# Patient Record
Sex: Female | Born: 1945
Health system: Southern US, Community
[De-identification: ages and names within clinical notes are randomized; demographics above are authoritative.]

## PROBLEM LIST (undated history)

## (undated) DIAGNOSIS — E785 Hyperlipidemia, unspecified: Secondary | ICD-10-CM

## (undated) DIAGNOSIS — G479 Sleep disorder, unspecified: Secondary | ICD-10-CM

## (undated) DIAGNOSIS — F32A Depression, unspecified: Secondary | ICD-10-CM

## (undated) DIAGNOSIS — Z720 Tobacco use: Secondary | ICD-10-CM

## (undated) DIAGNOSIS — E039 Hypothyroidism, unspecified: Secondary | ICD-10-CM

## (undated) DIAGNOSIS — I700 Atherosclerosis of aorta: Secondary | ICD-10-CM

## (undated) DIAGNOSIS — M1990 Unspecified osteoarthritis, unspecified site: Secondary | ICD-10-CM

## (undated) DIAGNOSIS — B029 Zoster without complications: Secondary | ICD-10-CM

## (undated) DIAGNOSIS — J449 Chronic obstructive pulmonary disease, unspecified: Secondary | ICD-10-CM

## (undated) DIAGNOSIS — F329 Major depressive disorder, single episode, unspecified: Secondary | ICD-10-CM

## (undated) DIAGNOSIS — I10 Essential (primary) hypertension: Secondary | ICD-10-CM

## (undated) HISTORY — PX: BIOPSY THYROID: PRO38

## (undated) HISTORY — DX: Sleep disorder, unspecified: G47.9

## (undated) HISTORY — DX: Hypothyroidism, unspecified: E03.9

## (undated) HISTORY — DX: Tobacco use: Z72.0

## (undated) HISTORY — PX: OTHER SURGICAL HISTORY: SHX169

## (undated) HISTORY — DX: Major depressive disorder, single episode, unspecified: F32.9

## (undated) HISTORY — DX: Depression, unspecified: F32.A

## (undated) HISTORY — DX: Unspecified osteoarthritis, unspecified site: M19.90

## (undated) HISTORY — PX: CORONARY ANGIOPLASTY: SHX604

## (undated) HISTORY — PX: ABDOMINAL HYSTERECTOMY: SHX81

## (undated) HISTORY — DX: Zoster without complications: B02.9

---

## 1996-12-05 HISTORY — PX: OTHER SURGICAL HISTORY: SHX169

## 1997-02-04 HISTORY — PX: OTHER SURGICAL HISTORY: SHX169

## 1998-05-07 LAB — FECAL OCCULT BLOOD, GUAIAC

## 1998-06-06 HISTORY — PX: OTHER SURGICAL HISTORY: SHX169

## 1999-08-14 ENCOUNTER — Emergency Department (HOSPITAL_COMMUNITY): Admission: EM | Admit: 1999-08-14 | Discharge: 1999-08-14 | Payer: Self-pay | Admitting: Emergency Medicine

## 2000-07-23 ENCOUNTER — Encounter: Payer: Self-pay | Admitting: Emergency Medicine

## 2000-07-23 ENCOUNTER — Emergency Department (HOSPITAL_COMMUNITY): Admission: EM | Admit: 2000-07-23 | Discharge: 2000-07-23 | Payer: Self-pay | Admitting: Emergency Medicine

## 2000-09-06 ENCOUNTER — Encounter: Payer: Self-pay | Admitting: Family Medicine

## 2000-09-06 LAB — CONVERTED CEMR LAB

## 2004-07-21 ENCOUNTER — Ambulatory Visit: Payer: Self-pay | Admitting: Family Medicine

## 2004-08-20 ENCOUNTER — Ambulatory Visit: Payer: Self-pay | Admitting: Family Medicine

## 2004-10-08 ENCOUNTER — Ambulatory Visit: Payer: Self-pay | Admitting: Family Medicine

## 2004-11-20 ENCOUNTER — Ambulatory Visit: Payer: Self-pay | Admitting: Family Medicine

## 2005-01-06 ENCOUNTER — Ambulatory Visit: Payer: Self-pay | Admitting: Family Medicine

## 2005-02-09 ENCOUNTER — Ambulatory Visit: Payer: Self-pay | Admitting: Family Medicine

## 2005-03-30 ENCOUNTER — Ambulatory Visit: Payer: Self-pay | Admitting: Family Medicine

## 2005-03-30 LAB — CONVERTED CEMR LAB: TSH: 4.15 microintl units/mL

## 2005-04-15 ENCOUNTER — Ambulatory Visit: Payer: Self-pay | Admitting: Family Medicine

## 2005-12-07 ENCOUNTER — Emergency Department: Payer: Self-pay | Admitting: Emergency Medicine

## 2006-08-31 ENCOUNTER — Ambulatory Visit: Payer: Self-pay | Admitting: Family Medicine

## 2006-09-22 ENCOUNTER — Ambulatory Visit: Payer: Self-pay | Admitting: Endocrinology

## 2006-09-22 ENCOUNTER — Other Ambulatory Visit: Admission: RE | Admit: 2006-09-22 | Discharge: 2006-09-22 | Payer: Self-pay | Admitting: Endocrinology

## 2006-09-22 ENCOUNTER — Encounter (INDEPENDENT_AMBULATORY_CARE_PROVIDER_SITE_OTHER): Payer: Self-pay | Admitting: *Deleted

## 2006-10-18 ENCOUNTER — Encounter: Admission: RE | Admit: 2006-10-18 | Discharge: 2006-10-18 | Payer: Self-pay | Admitting: Surgery

## 2006-10-27 ENCOUNTER — Encounter (INDEPENDENT_AMBULATORY_CARE_PROVIDER_SITE_OTHER): Payer: Self-pay | Admitting: Specialist

## 2006-10-27 ENCOUNTER — Other Ambulatory Visit: Admission: RE | Admit: 2006-10-27 | Discharge: 2006-10-27 | Payer: Self-pay | Admitting: Interventional Radiology

## 2006-10-27 ENCOUNTER — Encounter: Admission: RE | Admit: 2006-10-27 | Discharge: 2006-10-27 | Payer: Self-pay | Admitting: Surgery

## 2006-11-22 ENCOUNTER — Ambulatory Visit: Payer: Self-pay | Admitting: Family Medicine

## 2006-11-22 LAB — CONVERTED CEMR LAB
ALT: 15 units/L (ref 0–40)
AST: 22 units/L (ref 0–37)
Cholesterol: 247 mg/dL (ref 0–200)
Direct LDL: 166.9 mg/dL
HDL: 57.3 mg/dL (ref >39.0)
Total CHOL/HDL Ratio: 4.3
Triglycerides: 101 mg/dL (ref 0–149)
VLDL: 20 mg/dL (ref 0–40)

## 2006-12-06 HISTORY — PX: OTHER SURGICAL HISTORY: SHX169

## 2006-12-14 ENCOUNTER — Encounter (INDEPENDENT_AMBULATORY_CARE_PROVIDER_SITE_OTHER): Payer: Self-pay | Admitting: Specialist

## 2006-12-14 ENCOUNTER — Ambulatory Visit (HOSPITAL_COMMUNITY): Admission: RE | Admit: 2006-12-14 | Discharge: 2006-12-15 | Payer: Self-pay | Admitting: Surgery

## 2006-12-16 ENCOUNTER — Ambulatory Visit: Payer: Self-pay | Admitting: Family Medicine

## 2007-01-05 ENCOUNTER — Telehealth (INDEPENDENT_AMBULATORY_CARE_PROVIDER_SITE_OTHER): Payer: Self-pay | Admitting: *Deleted

## 2007-02-09 ENCOUNTER — Encounter: Payer: Self-pay | Admitting: Family Medicine

## 2007-02-09 DIAGNOSIS — E039 Hypothyroidism, unspecified: Secondary | ICD-10-CM | POA: Insufficient documentation

## 2007-02-09 DIAGNOSIS — E069 Thyroiditis, unspecified: Secondary | ICD-10-CM | POA: Insufficient documentation

## 2007-02-09 DIAGNOSIS — G479 Sleep disorder, unspecified: Secondary | ICD-10-CM | POA: Insufficient documentation

## 2007-02-09 DIAGNOSIS — Z8639 Personal history of other endocrine, nutritional and metabolic disease: Secondary | ICD-10-CM | POA: Insufficient documentation

## 2007-02-09 DIAGNOSIS — M1990 Unspecified osteoarthritis, unspecified site: Secondary | ICD-10-CM | POA: Insufficient documentation

## 2007-02-09 DIAGNOSIS — Z8659 Personal history of other mental and behavioral disorders: Secondary | ICD-10-CM | POA: Insufficient documentation

## 2007-02-09 DIAGNOSIS — E7800 Pure hypercholesterolemia, unspecified: Secondary | ICD-10-CM | POA: Insufficient documentation

## 2007-02-09 DIAGNOSIS — M810 Age-related osteoporosis without current pathological fracture: Secondary | ICD-10-CM | POA: Insufficient documentation

## 2007-02-10 ENCOUNTER — Telehealth (INDEPENDENT_AMBULATORY_CARE_PROVIDER_SITE_OTHER): Payer: Self-pay | Admitting: *Deleted

## 2007-02-10 ENCOUNTER — Ambulatory Visit: Payer: Self-pay | Admitting: Family Medicine

## 2007-02-10 DIAGNOSIS — R609 Edema, unspecified: Secondary | ICD-10-CM | POA: Insufficient documentation

## 2007-02-10 DIAGNOSIS — Z87891 Personal history of nicotine dependence: Secondary | ICD-10-CM | POA: Insufficient documentation

## 2007-02-27 ENCOUNTER — Ambulatory Visit: Payer: Self-pay | Admitting: Family Medicine

## 2007-03-06 LAB — CONVERTED CEMR LAB
ALT: 13 U/L
AST: 24 U/L
Cholesterol: 237 mg/dL
Direct LDL: 150 mg/dL
HDL: 65.2 mg/dL
Total CHOL/HDL Ratio: 3.6
Triglycerides: 67 mg/dL
VLDL: 13 mg/dL

## 2007-03-15 ENCOUNTER — Ambulatory Visit: Payer: Self-pay | Admitting: Family Medicine

## 2007-03-15 DIAGNOSIS — I739 Peripheral vascular disease, unspecified: Secondary | ICD-10-CM | POA: Insufficient documentation

## 2007-03-22 ENCOUNTER — Ambulatory Visit: Payer: Self-pay

## 2007-04-14 ENCOUNTER — Ambulatory Visit: Payer: Self-pay | Admitting: Family Medicine

## 2007-04-17 LAB — CONVERTED CEMR LAB
ALT: 14 units/L (ref 0–35)
AST: 24 units/L (ref 0–37)
Albumin: 3.8 g/dL (ref 3.5–5.2)
BUN: 12 mg/dL (ref 6–23)
CO2: 28 meq/L (ref 19–32)
Calcium: 9.2 mg/dL (ref 8.4–10.5)
Chloride: 99 meq/L (ref 96–112)
Creatinine, Ser: 0.7 mg/dL (ref 0.4–1.2)
GFR calc Af Amer: 109 mL/min
GFR calc non Af Amer: 90 mL/min
Glucose, Bld: 98 mg/dL (ref 70–99)
Phosphorus: 4.2 mg/dL (ref 2.3–4.6)
Potassium: 4.8 meq/L (ref 3.5–5.1)
Pro B Natriuretic peptide (BNP): 33 pg/mL (ref 0.0–100.0)
Sodium: 135 meq/L (ref 135–145)
TSH: 25.98 microintl units/mL — ABNORMAL HIGH (ref 0.35–5.50)

## 2007-05-31 ENCOUNTER — Ambulatory Visit: Payer: Self-pay | Admitting: Family Medicine

## 2007-06-01 LAB — CONVERTED CEMR LAB: TSH: 1.15 microintl units/mL (ref 0.35–5.50)

## 2007-06-03 ENCOUNTER — Encounter: Payer: Self-pay | Admitting: *Deleted

## 2007-08-29 ENCOUNTER — Ambulatory Visit: Payer: Self-pay | Admitting: Family Medicine

## 2007-09-04 ENCOUNTER — Encounter (INDEPENDENT_AMBULATORY_CARE_PROVIDER_SITE_OTHER): Payer: Self-pay | Admitting: *Deleted

## 2007-09-04 LAB — CONVERTED CEMR LAB
ALT: 16 units/L (ref 0–35)
AST: 22 units/L (ref 0–37)
Albumin: 3.6 g/dL (ref 3.5–5.2)
Alkaline Phosphatase: 63 units/L (ref 39–117)
BUN: 10 mg/dL (ref 6–23)
Basophils Absolute: 0.1 10*3/uL (ref 0.0–0.1)
Basophils Relative: 1 % (ref 0.0–1.0)
Bilirubin, Direct: 0.1 mg/dL (ref 0.0–0.3)
CO2: 30 meq/L (ref 19–32)
Calcium: 8.8 mg/dL (ref 8.4–10.5)
Chloride: 103 meq/L (ref 96–112)
Cholesterol: 168 mg/dL (ref 0–200)
Creatinine, Ser: 0.7 mg/dL (ref 0.4–1.2)
Eosinophils Absolute: 0.1 10*3/uL (ref 0.0–0.6)
Eosinophils Relative: 1.2 % (ref 0.0–5.0)
GFR calc Af Amer: 109 mL/min
GFR calc non Af Amer: 90 mL/min
Glucose, Bld: 86 mg/dL (ref 70–99)
HCT: 37.1 % (ref 36.0–46.0)
HDL: 49.3 mg/dL (ref >39.0)
Hemoglobin: 13 g/dL (ref 12.0–15.0)
LDL Cholesterol: 111 mg/dL — ABNORMAL HIGH (ref 0–99)
Lymphocytes Relative: 52.9 % — ABNORMAL HIGH (ref 12.0–46.0)
MCHC: 35 g/dL (ref 30.0–36.0)
MCV: 92.1 fL (ref 78.0–100.0)
Monocytes Absolute: 0.6 10*3/uL (ref 0.2–0.7)
Monocytes Relative: 9.7 % (ref 3.0–11.0)
Neutro Abs: 2.2 10*3/uL (ref 1.4–7.7)
Neutrophils Relative %: 35.2 % — ABNORMAL LOW (ref 43.0–77.0)
Platelets: 273 10*3/uL (ref 150–400)
Potassium: 3.8 meq/L (ref 3.5–5.1)
RBC: 4.03 M/uL (ref 3.87–5.11)
RDW: 12.7 % (ref 11.5–14.6)
Sodium: 140 meq/L (ref 135–145)
TSH: 0.18 microintl units/mL — ABNORMAL LOW (ref 0.35–5.50)
Total Bilirubin: 0.7 mg/dL (ref 0.3–1.2)
Total CHOL/HDL Ratio: 3.4
Total Protein: 6.5 g/dL (ref 6.0–8.3)
Triglycerides: 41 mg/dL (ref 0–149)
VLDL: 8 mg/dL (ref 0–40)
WBC: 6.3 10*3/uL (ref 4.5–10.5)

## 2007-10-02 ENCOUNTER — Telehealth (INDEPENDENT_AMBULATORY_CARE_PROVIDER_SITE_OTHER): Payer: Self-pay | Admitting: Internal Medicine

## 2007-11-10 ENCOUNTER — Ambulatory Visit: Payer: Self-pay | Admitting: Family Medicine

## 2007-11-15 LAB — CONVERTED CEMR LAB
Free T4: 1.02 ng/dL (ref 0.89–1.80)
TSH: 13.679 microintl units/mL — ABNORMAL HIGH (ref 0.350–5.50)

## 2007-11-22 ENCOUNTER — Encounter: Payer: Self-pay | Admitting: Family Medicine

## 2007-11-30 ENCOUNTER — Encounter (INDEPENDENT_AMBULATORY_CARE_PROVIDER_SITE_OTHER): Payer: Self-pay | Admitting: *Deleted

## 2007-12-28 ENCOUNTER — Ambulatory Visit: Payer: Self-pay | Admitting: Family Medicine

## 2007-12-29 ENCOUNTER — Telehealth: Payer: Self-pay | Admitting: Family Medicine

## 2008-01-01 LAB — CONVERTED CEMR LAB
Free T4: 1.1 ng/dL (ref 0.6–1.6)
TSH: 0.83 microintl units/mL (ref 0.35–5.50)

## 2008-01-10 ENCOUNTER — Ambulatory Visit: Payer: Self-pay | Admitting: Family Medicine

## 2008-01-10 DIAGNOSIS — J309 Allergic rhinitis, unspecified: Secondary | ICD-10-CM | POA: Insufficient documentation

## 2008-04-12 ENCOUNTER — Ambulatory Visit: Payer: Self-pay | Admitting: Family Medicine

## 2008-04-15 ENCOUNTER — Encounter (INDEPENDENT_AMBULATORY_CARE_PROVIDER_SITE_OTHER): Payer: Self-pay | Admitting: *Deleted

## 2008-04-15 LAB — CONVERTED CEMR LAB: TSH: 0.06 microintl units/mL — ABNORMAL LOW (ref 0.350–4.50)

## 2008-05-29 ENCOUNTER — Ambulatory Visit: Payer: Self-pay | Admitting: Family Medicine

## 2008-05-31 LAB — CONVERTED CEMR LAB
Free T4: 0.5 ng/dL — ABNORMAL LOW (ref 0.6–1.6)
TSH: 49.73 microintl units/mL — ABNORMAL HIGH (ref 0.35–5.50)

## 2008-06-21 ENCOUNTER — Ambulatory Visit: Payer: Self-pay | Admitting: Family Medicine

## 2008-06-24 LAB — CONVERTED CEMR LAB
Basophils Absolute: 0 10*3/uL (ref 0.0–0.1)
Basophils Relative: 1 % (ref 0–1)
Eosinophils Absolute: 0.1 10*3/uL (ref 0.0–0.7)
Eosinophils Relative: 2 % (ref 0–5)
HCT: 37.9 % (ref 36.0–46.0)
Hemoglobin: 12.7 g/dL (ref 12.0–15.0)
Lymphocytes Relative: 41 % (ref 12–46)
Lymphs Abs: 2.9 10*3/uL (ref 0.7–4.0)
MCHC: 33.5 g/dL (ref 30.0–36.0)
MCV: 91.5 fL (ref 78.0–100.0)
Monocytes Absolute: 0.6 10*3/uL (ref 0.1–1.0)
Monocytes Relative: 9 % (ref 3–12)
Neutro Abs: 3.4 10*3/uL (ref 1.7–7.7)
Neutrophils Relative %: 48 % (ref 43–77)
Platelets: 272 10*3/uL (ref 150–400)
RBC: 4.14 M/uL (ref 3.87–5.11)
RDW: 13.6 % (ref 11.5–15.5)
TSH: 27.96 microintl units/mL — ABNORMAL HIGH (ref 0.350–4.50)
WBC: 7.1 10*3/uL (ref 4.0–10.5)

## 2008-06-26 ENCOUNTER — Telehealth: Payer: Self-pay | Admitting: Family Medicine

## 2008-07-08 ENCOUNTER — Ambulatory Visit: Payer: Self-pay | Admitting: Family Medicine

## 2008-07-09 ENCOUNTER — Encounter (INDEPENDENT_AMBULATORY_CARE_PROVIDER_SITE_OTHER): Payer: Self-pay | Admitting: *Deleted

## 2008-07-18 ENCOUNTER — Ambulatory Visit: Payer: Self-pay | Admitting: Family Medicine

## 2008-07-25 LAB — CONVERTED CEMR LAB
BUN: 11 mg/dL (ref 6–23)
CO2: 31 meq/L (ref 19–32)
Calcium: 8.8 mg/dL (ref 8.4–10.5)
Chloride: 107 meq/L (ref 96–112)
Creatinine, Ser: 0.6 mg/dL (ref 0.4–1.2)
GFR calc Af Amer: 130 mL/min
GFR calc non Af Amer: 108 mL/min
Glucose, Bld: 103 mg/dL — ABNORMAL HIGH (ref 70–99)
Potassium: 4.1 meq/L (ref 3.5–5.1)
Sodium: 141 meq/L (ref 135–145)
TSH: 6.35 microintl units/mL — ABNORMAL HIGH (ref 0.35–5.50)

## 2009-01-10 ENCOUNTER — Ambulatory Visit: Payer: Self-pay | Admitting: Family Medicine

## 2009-01-13 LAB — CONVERTED CEMR LAB
ALT: 11 units/L (ref 0–35)
AST: 17 units/L (ref 0–37)
Albumin: 4 g/dL (ref 3.5–5.2)
BUN: 16 mg/dL (ref 6–23)
CO2: 25 meq/L (ref 19–32)
Calcium: 9.1 mg/dL (ref 8.4–10.5)
Chloride: 106 meq/L (ref 96–112)
Cholesterol: 211 mg/dL — ABNORMAL HIGH (ref 0–200)
Creatinine, Ser: 0.69 mg/dL (ref 0.40–1.20)
Glucose, Bld: 105 mg/dL — ABNORMAL HIGH (ref 70–99)
HDL: 68 mg/dL (ref >39)
LDL Cholesterol: 127 mg/dL — ABNORMAL HIGH (ref 0–99)
Phosphorus: 3.6 mg/dL (ref 2.3–4.6)
Potassium: 4.1 meq/L (ref 3.5–5.3)
Sodium: 143 meq/L (ref 135–145)
TSH: 0.499 microintl units/mL (ref 0.350–4.500)
Total CHOL/HDL Ratio: 3.1
Triglycerides: 81 mg/dL (ref <150)
VLDL: 16 mg/dL (ref 0–40)

## 2009-02-19 ENCOUNTER — Ambulatory Visit: Payer: Self-pay | Admitting: Family Medicine

## 2009-03-04 ENCOUNTER — Ambulatory Visit: Payer: Self-pay | Admitting: Family Medicine

## 2009-03-04 DIAGNOSIS — M21619 Bunion of unspecified foot: Secondary | ICD-10-CM | POA: Insufficient documentation

## 2009-03-25 ENCOUNTER — Ambulatory Visit: Payer: Self-pay | Admitting: Family Medicine

## 2009-03-25 ENCOUNTER — Telehealth: Payer: Self-pay | Admitting: Family Medicine

## 2009-04-13 ENCOUNTER — Emergency Department: Payer: Self-pay | Admitting: Emergency Medicine

## 2009-04-18 ENCOUNTER — Ambulatory Visit: Payer: Self-pay | Admitting: Family Medicine

## 2009-04-21 LAB — CONVERTED CEMR LAB
ALT: 8 units/L (ref 0–35)
AST: 16 units/L (ref 0–37)
Cholesterol: 207 mg/dL — ABNORMAL HIGH (ref 0–200)
HDL: 59 mg/dL (ref >39)
LDL Cholesterol: 129 mg/dL — ABNORMAL HIGH (ref 0–99)
TSH: 1.973 microintl units/mL (ref 0.350–4.500)
Total CHOL/HDL Ratio: 3.5
Triglycerides: 95 mg/dL (ref <150)
VLDL: 19 mg/dL (ref 0–40)

## 2009-05-14 ENCOUNTER — Ambulatory Visit: Payer: Self-pay | Admitting: Family Medicine

## 2009-05-21 ENCOUNTER — Encounter: Admission: RE | Admit: 2009-05-21 | Discharge: 2009-05-21 | Payer: Self-pay | Admitting: Family Medicine

## 2009-05-22 ENCOUNTER — Ambulatory Visit: Payer: Self-pay | Admitting: Family Medicine

## 2009-05-26 ENCOUNTER — Encounter: Payer: Self-pay | Admitting: Family Medicine

## 2009-06-23 ENCOUNTER — Encounter: Payer: Self-pay | Admitting: Family Medicine

## 2009-06-24 ENCOUNTER — Telehealth: Payer: Self-pay | Admitting: Family Medicine

## 2009-06-27 ENCOUNTER — Telehealth: Payer: Self-pay | Admitting: Family Medicine

## 2009-07-17 ENCOUNTER — Encounter: Payer: Self-pay | Admitting: Family Medicine

## 2009-07-24 ENCOUNTER — Telehealth (INDEPENDENT_AMBULATORY_CARE_PROVIDER_SITE_OTHER): Payer: Self-pay | Admitting: *Deleted

## 2009-08-18 ENCOUNTER — Ambulatory Visit: Payer: Self-pay | Admitting: Family Medicine

## 2009-08-18 DIAGNOSIS — I10 Essential (primary) hypertension: Secondary | ICD-10-CM | POA: Insufficient documentation

## 2009-08-20 ENCOUNTER — Telehealth: Payer: Self-pay | Admitting: Family Medicine

## 2009-08-22 LAB — CONVERTED CEMR LAB
Albumin: 3.6 g/dL (ref 3.5–5.2)
BUN: 11 mg/dL (ref 6–23)
Basophils Absolute: 0.1 10*3/uL (ref 0.0–0.1)
Basophils Relative: 1.9 % (ref 0.0–3.0)
CO2: 31 meq/L (ref 19–32)
Calcium: 8.8 mg/dL (ref 8.4–10.5)
Chloride: 106 meq/L (ref 96–112)
Creatinine, Ser: 0.7 mg/dL (ref 0.4–1.2)
Eosinophils Absolute: 0.2 10*3/uL (ref 0.0–0.7)
Eosinophils Relative: 3.9 % (ref 0.0–5.0)
GFR calc non Af Amer: 89.56 mL/min (ref >60)
Glucose, Bld: 88 mg/dL (ref 70–99)
HCT: 39.2 % (ref 36.0–46.0)
Hemoglobin: 13.2 g/dL (ref 12.0–15.0)
Lymphocytes Relative: 34.7 % (ref 12.0–46.0)
Lymphs Abs: 2.2 10*3/uL (ref 0.7–4.0)
MCHC: 33.7 g/dL (ref 30.0–36.0)
MCV: 94.9 fL (ref 78.0–100.0)
Monocytes Absolute: 0.4 10*3/uL (ref 0.1–1.0)
Monocytes Relative: 6 % (ref 3.0–12.0)
Neutro Abs: 3.4 10*3/uL (ref 1.4–7.7)
Neutrophils Relative %: 53.5 % (ref 43.0–77.0)
Phosphorus: 3.6 mg/dL (ref 2.3–4.6)
Platelets: 257 10*3/uL (ref 150.0–400.0)
Potassium: 4.1 meq/L (ref 3.5–5.1)
RBC: 4.13 M/uL (ref 3.87–5.11)
RDW: 13.7 % (ref 11.5–14.6)
Sodium: 142 meq/L (ref 135–145)
TSH: 1.29 microintl units/mL (ref 0.35–5.50)
WBC: 6.3 10*3/uL (ref 4.5–10.5)

## 2009-08-27 ENCOUNTER — Telehealth: Payer: Self-pay | Admitting: Family Medicine

## 2009-09-04 ENCOUNTER — Telehealth: Payer: Self-pay | Admitting: Family Medicine

## 2009-09-09 ENCOUNTER — Ambulatory Visit: Payer: Self-pay | Admitting: Family Medicine

## 2009-11-04 HISTORY — PX: THYROIDECTOMY: SHX17

## 2009-11-18 ENCOUNTER — Telehealth: Payer: Self-pay | Admitting: Family Medicine

## 2009-12-18 ENCOUNTER — Telehealth: Payer: Self-pay | Admitting: Family Medicine

## 2010-01-01 ENCOUNTER — Telehealth: Payer: Self-pay | Admitting: Family Medicine

## 2010-03-17 ENCOUNTER — Ambulatory Visit: Payer: Self-pay | Admitting: Family Medicine

## 2010-03-18 LAB — CONVERTED CEMR LAB
ALT: 10 units/L (ref 0–35)
AST: 21 units/L (ref 0–37)
Cholesterol: 209 mg/dL — ABNORMAL HIGH (ref 0–200)
Direct LDL: 126.8 mg/dL
HDL: 75.4 mg/dL (ref >39.00)
TSH: 0.16 microintl units/mL — ABNORMAL LOW (ref 0.35–5.50)
Total CHOL/HDL Ratio: 3
Triglycerides: 62 mg/dL (ref 0.0–149.0)
VLDL: 12.4 mg/dL (ref 0.0–40.0)

## 2010-05-07 ENCOUNTER — Telehealth: Payer: Self-pay | Admitting: Family Medicine

## 2010-05-12 ENCOUNTER — Encounter (INDEPENDENT_AMBULATORY_CARE_PROVIDER_SITE_OTHER): Payer: Self-pay | Admitting: *Deleted

## 2010-05-19 ENCOUNTER — Ambulatory Visit: Payer: Self-pay | Admitting: Family Medicine

## 2010-05-21 LAB — CONVERTED CEMR LAB
Free T4: 0.87 ng/dL (ref 0.60–1.60)
TSH: 0.72 microintl units/mL (ref 0.35–5.50)

## 2010-05-25 ENCOUNTER — Ambulatory Visit: Payer: Self-pay | Admitting: Family Medicine

## 2010-07-10 ENCOUNTER — Telehealth: Payer: Self-pay | Admitting: Family Medicine

## 2010-09-28 ENCOUNTER — Encounter: Payer: Self-pay | Admitting: Family Medicine

## 2010-10-08 NOTE — Letter (Signed)
Summary: Out of Work  [employer] at [facility]  [address] [place], [place] [address]   Phone: [phone]  Fax: [phone]    May 22, 2009   Employee:  Aizlyn L Skarda    To Whom It May Concern:   For Medical reasons, please excuse the above named employee from work for the following dates:  Start:   05/22/2009  End:   patient has upcoming orthopedics eval within the next few days -- will defer return to work to them.  If you need additional information, please feel free to contact our office.         Sincerely,    Hannah Beat MD

## 2010-10-08 NOTE — Progress Notes (Signed)
Summary: pt needs another referral  Phone Note Call from Patient Call back at [phone]   Caller: Patient Call For: Judith Part MD Summary of Call: Pt went to see Dr. Willeen Cass today but she didnt see him because they wanted too much money up front and she didnt have it.  She is asking if she can be referred somewhere else, where she wont have to have as much up front.  They said she had to have $160.00 before she could be seen.  Please advise. Initial call taken by: Lowella Petties CMA,  August 20, 2009 2:50 PM  Follow-up for Phone Call        Shirlee Limerick- ? any ideas  Follow-up by: Judith Part MD,  August 20, 2009 3:35 PM  Additional Follow-up for Phone Call Additional follow up Details #1::        [facility] they would require $100 at 1st appt then payment plans. Called the patient and she cant afford to pay anything right now. She has an appt December 22nd at the [facility] in [place], I asked her to plese call me tommorrow to update you with what treatment they would do for her or if they would be referring her to a specialist. She said she would call me after the appt.  Additional Follow-up by: Carlton Adam,  August 26, 2009 2:47 PM    Additional Follow-up for Phone Call Additional follow up Details #2::    thanks for the update - I will wait for more info Follow-up by: Judith Part MD,  August 26, 2009 3:37 PM

## 2010-10-08 NOTE — Progress Notes (Signed)
Summary: Bone density  Phone Note Call from Patient Call back at Home Phone [phone]   Caller: Patient Call For: Dr. Patsy Lager Summary of Call: patient calling needing a bone density test. Please advise Initial call taken by: Mervin Hack CMA Duncan Dull),  July 24, 2009 1:46 PM  Follow-up for Phone Call        Ms. Leeder had a bone density test 11/2007.  She definitely has significant osteoporosis. She should still be taking her Fosamax and Vitamin D and Calcium.   I saw that Dr. Lestine Box asked her to check about this. It should be OK to wait until spring to repeat, but should definitely be taking the medicines above.  cc: Dr. Milinda Antis, fyi, complex foot diffuse stress reaction / stress fractures involving most of forefoot. Follow-up by: Hannah Beat MD,  July 24, 2009 1:51 PM  Additional Follow-up for Phone Call Additional follow up Details #1::        Patient advised.Consuello Masse CMA  Additional Follow-up by: Benny Lennert CMA (AAMA),  July 24, 2009 3:15 PM

## 2010-10-08 NOTE — Progress Notes (Signed)
Summary: refill request for temazepam  Phone Note Refill Request Message from:  Fax from Pharmacy  Refills Requested: Medication #1:  RESTORIL 30 MG CAPS 1 by mouth at bedtime as needed insomnia   Last Refilled: 04/03/2010 Faxed request from asher mcadams, [phone].  Initial call taken by: Lowella Petties CMA,  May 07, 2010 9:59 AM  Follow-up for Phone Call        px written on EMR for call in  Follow-up by: Judith Part MD,  May 07, 2010 11:09 AM  Additional Follow-up for Phone Call Additional follow up Details #1::        Medication phoned to [facility] pharmacy as instructed. Lewanda Rife LPN  May 07, 2010 2:31 PM     Prescriptions: RESTORIL 30 MG CAPS (TEMAZEPAM) 1 by mouth at bedtime as needed insomnia  #30 x 5   Entered and Authorized by:   Judith Part MD   Signed by:   Lewanda Rife LPN on 91/47/8295   Method used:   Telephoned to ...       [facility] (retail)       [address]       [place], [place]  [id number]       Ph: [phone]       Fax: [phone]   RxID:   [id number]

## 2010-10-08 NOTE — Progress Notes (Signed)
Summary: ortho  Phone Note Call from Patient   Caller: Spouse Call For: [name] [name] Summary of Call: Patient want a orthopedics dr in [place] becuase they can not afford to keep going to [place] and paying a 60 dollar copay also Initial call taken by: Benny Lennert CMA Duncan Dull),  June 27, 2009 9:46 AM  Follow-up for Phone Call        Complex foot injury - I would recommend follow-up with Dr Lestine Box, the only foot specialist in this area Follow-up by: Hannah Beat MD,  June 27, 2009 9:49 AM  Additional Follow-up for Phone Call Additional follow up Details #1::        Patient advised.Consuello Masse CMA  Additional Follow-up by: Benny Lennert CMA (AAMA),  June 27, 2009 11:05 AM

## 2010-10-08 NOTE — Progress Notes (Signed)
Summary: refill request for temazepam  Phone Note Refill Request Message from:  Fax from Pharmacy  Refills Requested: Medication #1:  temazepam 30 mg   Last Refilled: 11/18/2009 Faxed request from [facility], this is not on med list.   [phone]  Initial call taken by: Lowella Petties CMA,  January 01, 2010 11:02 AM  Follow-up for Phone Call        px written on EMR for call in  Follow-up by: Judith Part MD,  January 01, 2010 12:59 PM  Additional Follow-up for Phone Call Additional follow up Details #1::        Medication phoned to [facility]  pharmacy as instructed. Lewanda Rife LPN  January 01, 2010 1:02 PM     Prescriptions: RESTORIL 30 MG CAPS (TEMAZEPAM) 1 by mouth at bedtime as needed insomnia  #30 x 3   Entered and Authorized by:   Judith Part MD   Signed by:   Lewanda Rife LPN on 45/40/9811   Method used:   Telephoned to ...       [facility] (retail)       [address]       [place], [place]  [id number]       Ph: [phone]       Fax: [phone]   RxID:   [id number]

## 2010-10-08 NOTE — Assessment & Plan Note (Signed)
Summary: has lump in neck   Vital Signs:  Patient profile:   65 year old female Weight:      125 pounds Temp:     97.7 degrees F oral Pulse rate:   76 / minute Pulse rhythm:   regular BP sitting:   158 / 84  (left arm) Cuff size:   regular  Vitals Entered By: Lowella Petties CMA (08/21/09 3:12 PM) CC: Lump right side of neck x 3 days, doesn't hurt., Back Pain   History of Present Illness: still seeing Dr Lestine Box for foot - pain is better / not gone and out of the boots   had to quit work for this  cannot walk on feet for long periods of time   has  lump on R front of her neck since saturday does not hurt  does not feel like thyroid is bigger - but feels like her throat is more closed up than it used to be  (partial thyroidectomy with Dr Jamey Ripa in the past )  more trouble with sleep lately  needs refil on her restoril -- with less activity- is sleeping less well   no fever/ sore throat  occ little dry cough - not productive  occ wheezing - not often is is still smoke free  occ some chest pain -- not when she exerts herself -- and no sob or nausea or sweating  not every day it just goes away within 30 - 60 minutes  no hx of heart problems or testing   mother had heart problems    Allergies: No Known Drug Allergies  Past History:  Past Medical History: Last updated: 07/08/2008 Depression Hypothyroidism Osteoarthritis Osteoporosis tab abuse- past (quit 09) sleep disorder  Past Surgical History: Last updated: 02/09/2007 Hysterectomy- cervical ca cells Left wrist fracture- surgery Bunions (06/1998) Hashimotos (02/1997) Dexa- OP (12/1996) Thyroid biopsy- pos neoplasm, surgery (09/2006) CXR- COPD, TS partial compression fracture (12/2006) Right thyroid lobectomy, goiter, thyroiditis (12/2006)  Family History: Last updated: August 21, 2009 Father: died of stroke at 47, HTN Mother: DM, CAD in her 64s  Siblings: brother drowned 30, boat  accident brother with DM  Social History: Last updated: 06/21/2008 Marital Status: widowed Children: 3 Occupation:  works at the [employer] quit smoking 10/09  Risk Factors: Smoking Status: quit (02/09/2007)  Family History: Father: died of stroke at 64, HTN Mother: DM, CAD in her 65s  Siblings: brother drowned 62, boat accident brother with DM  Review of Systems General:  Complains of fatigue; denies chills, fever, loss of appetite, and malaise. Eyes:  Denies blurring, discharge, and eye irritation. ENT:  Denies hoarseness, nasal congestion, postnasal drainage, sinus pressure, and sore throat. CV:  Denies chest pain or discomfort, lightheadness, and palpitations. Resp:  Complains of cough; denies coughing up blood, shortness of breath, and sputum productive. GI:  Denies abdominal pain, bloody stools, and change in bowel habits. GU:  Denies dysuria. MS:  Complains of joint pain; denies joint redness and joint swelling. Derm:  Denies itching, lesion(s), poor wound healing, and rash. Neuro:  Denies numbness and tingling. Psych:  mood is ok. Endo:  Denies cold intolerance, excessive thirst, excessive urination, and heat intolerance. Heme:  Denies abnormal bruising and bleeding.  Physical Exam  General:  somewhat frail appearing elderly female Head:  normocephalic, atraumatic, no abnormalities observed, and no abnormalities palpated.  no sinus tenderness Eyes:  vision grossly intact, pupils equal, pupils round, and pupils reactive to light.  no conjunctival pallor, injection or icterus  Ears:  R ear normal and L ear normal.   Nose:  nares boggy but clear  Mouth:  pharynx pink and moist, no erythema, and no exudates.   Neck:  1.5 cm mobile nontender mass under L side of mandible  no other masses  L sided goiter noted   Chest Wall:  No deformities, masses, or tenderness noted. Lungs:  diffusely distant bs , without rales or rhonchi or crackles fair air exch Heart:   RRR Abdomen:  no renal bruits soft, non-tender, normal bowel sounds, no hepatomegaly, and no splenomegaly.   Pulses:  plus one pedal pulses  Extremities:  no CCE Neurologic:  sensation intact to light touch, gait normal, and DTRs symmetrical and normal.   Skin:  Intact without suspicious lesions or rashes Cervical Nodes:  see neck exam Axillary Nodes:  No palpable lymphadenopathy Inguinal Nodes:  No significant adenopathy Psych:  normal affect, talkative and pleasant    Impression & Recommendations:  Problem # 1:  SWELLING MASS OR LUMP IN HEAD AND NECK (ICD-784.2) Assessment New new mass in R submandibular area in prev smoker check cbc today (no hx of infection)  ref to ENT Orders: TLB-CBC Platelet - w/Differential (85025-CBCD) ENT Referral (ENT)  Problem # 2:  CHEST PAIN (ICD-786.50) Assessment: New  intermittent and atypical  nl EKG today pt does have newly elevated bp  will tx that with close f/u low threshold to get stress test   Orders: EKG w/ Interpretation (93000)  Problem # 3:  HYPOTHYROIDISM (ICD-244.9) with goiter after partial thyroidectomy  lab today for tsh Her updated medication list for this problem includes:    Levoxyl 75 Mcg Tabs (Levothyroxine sodium) ..... One by mouth daily  Orders: TLB-TSH (Thyroid Stimulating Hormone) (84443-TSH)  Problem # 4:  ESSENTIAL HYPERTENSION (ICD-401.9) bp up last few visits disc poss of eff from nsaid - pt states she cannot stop it  will tx with lisinopril (disc poss side eff)  f/u 2-4 weeks  ekg nl today Her updated medication list for this problem includes:    Zestril 10 Mg Tabs (Lisinopril) .Marland Kitchen... 1 by mouth once daily  Orders: Venipuncture (25366) TLB-Renal Function Panel (80069-RENAL) TLB-CBC Platelet - w/Differential (85025-CBCD) TLB-TSH (Thyroid Stimulating Hormone) (84443-TSH)  Problem # 5:  STRESS FRACTURE OF THE METATARSALS (ICD-733.94) Assessment: Improved overall doing better need to plan dexa  at follow up Her updated medication list for this problem includes:    Fosamax 70 Mg Tabs (Alendronate sodium) .Marland Kitchen... 1 by mouth every week as directed  Complete Medication List: 1)  Nortriptyline Hcl 75 Mg Caps (Nortriptyline hcl) .... Take one by mouth at bedtime with a 25 mg pill also 2)  Calcium 1200 Mg  .... Take one by mouth daily 3)  Levoxyl 75 Mcg Tabs (Levothyroxine sodium) .... One by mouth daily 4)  Zocor 80 Mg Tabs (Simvastatin) .Marland Kitchen.. 1 by mouth once daily 5)  Nortriptyline Hcl 25 Mg Caps (Nortriptyline hcl) .Marland Kitchen.. 1 by mouth at bedtime with her 75 mg capsule also 6)  Fosamax 70 Mg Tabs (Alendronate sodium) .Marland Kitchen.. 1 by mouth every week as directed 7)  Restoril 30 Mg Caps (Temazepam) .Marland Kitchen.. 1 by mouth at bedtime as needed insomnia 8)  Diclofenac Sodium 75 Mg Tbec (Diclofenac sodium) .Marland Kitchen.. 1 each morning with food for foot pain 9)  Nitroglycerin 0.1 Mg/hr Pt24 (Nitroglycerin) .... 1/4 of a patch applied daily as instructed 10)  Zestril 10 Mg Tabs (Lisinopril) .Marland Kitchen.. 1 by mouth once daily  Patient Instructions: 1)  start lisinopril 10 mg one pill daily am for high blood pressure - and update me if any side effects or problems  2)  follow up with me in 2-4 weeks  3)  labs today 4)  we will do ENTdoctor referral at check out  5)  watch salt in diet for high blood pressure  Prescriptions: RESTORIL 30 MG CAPS (TEMAZEPAM) 1 by mouth at bedtime as needed insomnia  #30 x 0   Entered and Authorized by:   Judith Part MD   Signed by:   Judith Part MD on 08/18/2009   Method used:   Print then Give to Patient   RxID:   [id number] FOSAMAX 70 MG  TABS (ALENDRONATE SODIUM) 1 by mouth every week as directed  #4 x 11   Entered and Authorized by:   Judith Part MD   Signed by:   Judith Part MD on 08/18/2009   Method used:   Print then Give to Patient   RxID:   [id number] ZESTRIL 10 MG TABS (LISINOPRIL) 1 by mouth once daily  #30 x 3   Entered and Authorized by:   Judith Part MD   Signed by:   Judith Part MD on 08/18/2009   Method used:   Print then Give to Patient   RxID:   [id number]   Prior Medications (reviewed today): NORTRIPTYLINE HCL 75 MG CAPS (NORTRIPTYLINE HCL) Take one by mouth at bedtime with a 25 mg pill also CALCIUM 1200 MG () take one by mouth daily LEVOXYL 75 MCG TABS (LEVOTHYROXINE SODIUM) one by mouth daily ZOCOR 80 MG TABS (SIMVASTATIN) 1 by mouth once daily NORTRIPTYLINE HCL 25 MG  CAPS (NORTRIPTYLINE HCL) 1 by mouth at bedtime with her 75 mg capsule also FOSAMAX 70 MG  TABS (ALENDRONATE SODIUM) 1 by mouth every week as directed RESTORIL 30 MG CAPS (TEMAZEPAM) 1 by mouth at bedtime as needed insomnia DICLOFENAC SODIUM 75 MG TBEC (DICLOFENAC SODIUM) 1 each morning with food for foot pain NITROGLYCERIN 0.1 MG/HR PT24 (NITROGLYCERIN) 1/4 of a patch applied daily as instructed ZESTRIL 10 MG TABS (LISINOPRIL) 1 by mouth once daily Current Allergies: No known allergies    EKG  Procedure date:  08/18/2009  Findings:      NSR with rate of 75 and no acute changes

## 2010-10-08 NOTE — Progress Notes (Signed)
Summary: nortriptyline request  Phone Note From Pharmacy   Caller: asher mcadams Call For: dr tower  Summary of Call: faxed refill request for nortriptyline Initial call taken by: Lowella Petties,  December 29, 2007 3:50 PM  Follow-up for Phone Call        px written on EMR for call in she takes 75 and a 25 mg pill together each night to make total dose 100 mg  both px on eMR  Follow-up by: Judith Part MD,  December 29, 2007 4:38 PM  Additional Follow-up for Phone Call Additional follow up Details #1::        called to asher mcadams Additional Follow-up by: Lowella Petties,  December 29, 2007 4:46 PM    New/Updated Medications: NORTRIPTYLINE HCL 75 MG CAPS (NORTRIPTYLINE HCL) Take one by mouth q hs with a 25 mg pill also NORTRIPTYLINE HCL 25 MG  CAPS (NORTRIPTYLINE HCL) 1 by mouth at bedtime with her 75 mg capsule also   Prescriptions: NORTRIPTYLINE HCL 25 MG  CAPS (NORTRIPTYLINE HCL) 1 by mouth at bedtime with her 75 mg capsule also  #30 x 5   Entered and Authorized by:   Judith Part MD   Signed by:   Lowella Petties on 12/29/2007   Method used:   Telephoned to ...         RxID:   [id number] NORTRIPTYLINE HCL 75 MG CAPS (NORTRIPTYLINE HCL) Take one by mouth q hs with a 25 mg pill also  #30 x 5   Entered and Authorized by:   Judith Part MD   Signed by:   Lowella Petties on 12/29/2007   Method used:   Telephoned to ...         RxID:   [id number]

## 2010-10-08 NOTE — Progress Notes (Signed)
  Phone Note Call from Patient   Caller: Patient Summary of Call: Patient called back to say that [facility] clinic Dr Olena Leatherwood has ordered an ultrasound on her neck to be done at [facility] next Wednesday 09/03/2009. She will call us back next week after the [place] to let you know what they found. Initial call taken by: Carlton Adam,  August 27, 2009 3:00 PM  Follow-up for Phone Call        I appreciate that update- thanks  Follow-up by: Judith Part MD,  August 27, 2009 3:05 PM

## 2010-10-08 NOTE — Consult Note (Signed)
Summary: [facility]  [facility]   Imported By: Lanelle Bal 07/22/2009 12:03:52  _____________________________________________________________________  External Attachment:    Type:   Image     Comment:   External Document

## 2010-10-08 NOTE — Letter (Signed)
Summary: [place] No Show Letter  [place] at [facility]  [address] [place], [place] [address]   Phone: [phone]  Fax: [phone]    05/12/2010 MRN: [id number]  Seva Howse [address] [place], [place]  [address]   Dear Ms. Sickman,   Our records indicate that you missed your scheduled appointment with _______LAB______________ on ____9/2/11________.  Please contact this office to reschedule your appointment as soon as possible.  It is important that you keep your scheduled appointments with your physician, so we can provide you the best care possible.  Please be advised that there may be a charge for "no show" appointments.    Sincerely,   [place] at [facility]

## 2010-10-08 NOTE — Assessment & Plan Note (Signed)
Summary: fu after  dexa scan   Vital Signs:  Patient Profile:   65 Years Old Female Height:     63.25 inches Weight:      119 pounds Temp:     98.1 degrees F oral Pulse rate:   96 / minute Pulse rhythm:   regular BP sitting:   104 / 72  (left arm) Cuff size:   regular  Vitals Entered By: Lowella Petties (Jan 10, 2008 3:39 PM)                 Chief Complaint:  Discuss dexa results.  History of Present Illness: almost quit smoking  down to 2 cig daily- ready to stop  lots of post nasal drip and cough- with pollen  stil cannot sleep well  4/24 labs for thyroid ok - no change in dose   some OP- esp in LS- with T of less than neg 3 no one in family  is post menupausal, thin and smokes  is taking ca and vit D regularly  ? if has ever been on med  no hx of tumor or implants dental  occ has a little heartburn- once a month at most   pulled what she thought was a tick off abd today L side ? how big it was  ? if all out itches and burns no rash or fever      Current Allergies: No known allergies   Past Medical History:    Depression    Hypothyroidism    Osteoarthritis    Osteoporosis    tab abuse    sleep disorder  Past Surgical History:    Reviewed history from 02/09/2007 and no changes required:       Hysterectomy- cervical ca cells       Left wrist fracture- surgery       Bunions (06/1998)       Hashimotos (02/1997)       Dexa- OP (12/1996)       Thyroid biopsy- pos neoplasm, surgery (09/2006)       CXR- COPD, TS partial compression fracture (12/2006)       Right thyroid lobectomy, goiter, thyroiditis (12/2006)   Family History:    Reviewed history from 11/10/2007 and no changes required:       Father: died of stroke at 36, HTN       Mother: DM       Siblings: brother drowned 36, boat accident       brother with DM  Social History:    Reviewed history from 11/10/2007 and no changes required:       Marital Status: widowed       Children:  3       Occupation:        works at the [employer]       smokes 1-2 cig daily    Review of Systems  General      Complains of fatigue.      Denies malaise and weight loss.  Eyes      Denies blurring.  ENT      Denies sore throat.  CV      Denies chest pain or discomfort and palpitations.  Resp      Denies cough, shortness of breath, and wheezing.  GI      Denies bloody stools, change in bowel habits, constipation, and diarrhea.  Derm      Complains of insect bite(s).      Denies  dryness and rash.  Neuro      Denies numbness, tingling, and tremors.  Psych      Denies panic attacks.      mood has been fair  Endo      Denies cold intolerance and heat intolerance.   Physical Exam  General:     slim and somewhat frail appearing Head:     normocephalic, atraumatic, and no abnormalities observed.   Eyes:     vision grossly intact, pupils equal, pupils round, and pupils reactive to light.  no conjunctival pallor, injection or icterus  Ears:     R ear normal and L ear normal.   Nose:     nasal dischargemucosal pallor.  some clear rhinorrhea  Mouth:     pharynx pink and moist, no erythema, and no exudates.   Neck:     no change in goiter- incision well healed Chest Wall:     No deformities, masses, or tenderness noted. Lungs:     diffusely distant bs without rales or wheeze  Heart:     Normal rate and regular rhythm. S1 and S2 normal without gallop, murmur, click, rub or other extra sounds. Abdomen:     Bowel sounds positive,abdomen soft and non-tender without masses, organomegaly or hernias noted. Msk:     no acute joint changes  Pulses:     plus one pedal pulses  Extremities:     No clubbing, cyanosis, edema, or deformity noted with normal full range of motion of all joints.   Neurologic:     sensation intact to light touch, gait normal, and DTRs symmetrical and normal.  no tremor  Skin:     Intact without suspicious lesions or rashes insect bite on L  abd with small amt of retained insect material present (suspect tick) area cleaned and material removed  abx ointment applied   Cervical Nodes:     No lymphadenopathy noted Inguinal Nodes:     No significant adenopathy Psych:     normal affect, talkative and pleasant (slt anxious which is baseline)    Impression & Recommendations:  Problem # 1:  TOBACCO ABUSE (ICD-305.1) Assessment: Unchanged needs to quit last 2 cig- disc this with risks in detail aware it continues to OP and worsens breathing as well  Problem # 2:  OSTEOPOROSIS (ICD-733.00) Assessment: Deteriorated will start fosamax counseled on poss side eff counseled on safety in detail- due to inc fx risk ca, vitD , exercise and smoking cess reviewed  Her updated medication list for this problem includes:    Fosamax 70 Mg Tabs (Alendronate sodium) .Marland Kitchen... 1 by mouth q week as directed   Problem # 3:  ALLERGIC RHINITIS (ICD-477.9) Assessment: Deteriorated with rhinorrhea will try claritin or zyrtec otc and update update if fever or facial pain  Problem # 4:  TICK BITE (ICD-E906.4) Assessment: New removed remainder of tick today  area cleaned adv to keep clean-use abx oint and watch for inc redness or swelling or any constitutional symptoms  Problem # 5:  HYPOTHYROIDISM (ICD-244.9) with hx of goiter and thyroid nodule removal labs stable on current dose will update if any hcange in symptoms  Her updated medication list for this problem includes:    Levoxyl 50 Mcg Tabs (Levothyroxine sodium) ..... One by mouth daily   Problem # 6:  Hx of SLEEP DISORDER (ICD-780.50) no imp with thyroid or tricyclic med titration will cautiously try ambien 10 mg and update disc habit forming potential- so not to  use every night  Complete Medication List: 1)  Nortriptyline Hcl 75 Mg Caps (Nortriptyline hcl) .... Take one by mouth q hs with a 25 mg pill also 2)  Calcium 1200 Mg  .... Take one by mouth daily 3)  Levoxyl 50 Mcg  Tabs (Levothyroxine sodium) .... One by mouth daily 4)  Zocor 80 Mg Tabs (Simvastatin) .Marland Kitchen.. 1 by mouth qd 5)  Nortriptyline Hcl 25 Mg Caps (Nortriptyline hcl) .Marland Kitchen.. 1 by mouth at bedtime with her 75 mg capsule also 6)  Ambien 10 Mg Tabs (Zolpidem tartrate) .... 1/2 to 1 by mouth at bedtime as needed insomnia 7)  Fosamax 70 Mg Tabs (Alendronate sodium) .Marland Kitchen.. 1 by mouth q week as directed   Patient Instructions: 1)  try the fosamax once weekly as directed 2)  continue your ca and vit D 3)  exercise as much as you can 4)  do be cautious for falls- as your fracture risk is high 5)  if any side effects to fosamax- such as heartburn- stop it and let me know 6)  no change in your thyroid dose  7)  claritin or zyrtec are ok over the counter for allergies if needed  8)  keep working on quitting smoking 9)  use caution with ambien for sleep- it can be habit forming and make you dizzy - and do not use every night  10)  if tick bite gets red or swollen or if you develop fever, headache, or joint pain- let me know 11)  follow up in 3 months   Prescriptions: FOSAMAX 70 MG  TABS (ALENDRONATE SODIUM) 1 by mouth q week as directed  #4 x 11   Entered and Authorized by:   Judith Part MD   Signed by:   Judith Part MD on 01/10/2008   Method used:   Print then Give to Patient   RxID:   [id number] AMBIEN 10 MG  TABS (ZOLPIDEM TARTRATE) 1/2 to 1 by mouth at bedtime as needed insomnia  #30 x 0   Entered and Authorized by:   Judith Part MD   Signed by:   Judith Part MD on 01/10/2008   Method used:   Print then Give to Patient   RxID:   [id number]  ]

## 2010-10-08 NOTE — Progress Notes (Signed)
Summary: refill request for nortriptyline  Phone Note Refill Request Message from:  Fax from Pharmacy  Refills Requested: Medication #1:  NORTRIPTYLINE HCL 25 MG  CAPS 1 by mouth at bedtime with her 75 mg capsule also   Last Refilled: 05/24/2009 Faxed request from [facility], phone [phone].  Initial call taken by: Lowella Petties CMA,  June 24, 2009 11:31 AM  Follow-up for Phone Call        px written on EMR for call in I did both her nortriptyline- as she will likely need both of them   Follow-up by: Judith Part MD,  June 24, 2009 1:05 PM  Additional Follow-up for Phone Call Additional follow up Details #1::        Pharmacy says they only have her on a 75 mg. capsule....no 25 mg.  ? if this request was put in incorrectly?  Okayed 75 mg. as instructed. Additional Follow-up by: Delilah Shan CMA (AAMA),  June 24, 2009 5:24 PM    Prescriptions: NORTRIPTYLINE HCL 75 MG CAPS (NORTRIPTYLINE HCL) Take one by mouth at bedtime with a 25 mg pill also  #30 x 5   Entered and Authorized by:   Judith Part MD   Signed by:   Judith Part MD on 06/24/2009   Method used:   Telephoned to ...       [facility] (retail)       [address]       [place], [place]  [id number]       Ph: [phone]       Fax: [phone]   RxID:   [id number]

## 2010-10-08 NOTE — Progress Notes (Signed)
Summary: CALL REPORT  Phone Note From Other Clinic   Caller: [facility] u/s dept Call For: tower Summary of Call: L LEG DOPPLAR NEG FOR DVT Initial call taken by: Liane Comber,  February 10, 2007 3:41 PM  Follow-up for Phone Call        please let pt know that doppler was negative for a blood clot try some ice to the foot and leg over the weekend (and take it easy) if symptoms do not improve next week, call and I will refer for an ABI test to check her circulation Follow-up by: Judith Part MD,  February 10, 2007 4:03 PM  Additional Follow-up for Phone Call Additional follow up Details #1::        PATIENT ADVISED ..................................................................Marland KitchenLiane Comber  February 10, 2007 4:27 PM

## 2010-10-08 NOTE — Miscellaneous (Signed)
Summary: med update  Clinical Lists Changes  Medications: Changed medication from LEVOXYL 75 MCG  TABS (LEVOTHYROXINE SODIUM) 1 by mouth qd to LEVOXYL 50 MCG  TABS (LEVOTHYROXINE SODIUM) one by mouth daily      Prior Medications: NORTRIPTYLINE HCL 75 MG CAPS (NORTRIPTYLINE HCL) Take one by mouth q hs CALCIUM 1200 MG () take one by mouth daily ZOCOR 80 MG TABS (SIMVASTATIN) 1 by mouth qd Current Allergies: No known allergies

## 2010-10-08 NOTE — Progress Notes (Signed)
Summary: refill request for zestril  Phone Note Refill Request Message from:  Fax from Pharmacy  Refills Requested: Medication #1:  ZESTRIL 10 MG TABS 1/2 by mouth once daily   Last Refilled: 05/18/2010 Faxed request from [facility].  Request is for one tablet daily, chart says pt takes one half daily.  Initial call taken by: Lowella Petties CMA, AAMA,  July 10, 2010 10:05 AM  Follow-up for Phone Call        can verify with pt but I think she takes 1/2 per day-- cut dose due to headache px written on EMR for call in  Follow-up by: Judith Part MD,  July 10, 2010 11:15 AM  Additional Follow-up for Phone Call Additional follow up Details #1::        Pt said she is taking 1/2 tb daily. Medication phoned to [facility] as instructed. Lewanda Rife LPN  July 10, 2010 12:10 PM     Prescriptions: ZESTRIL 10 MG TABS (LISINOPRIL) 1/2 by mouth once daily  #15 x 11   Entered and Authorized by:   Judith Part MD   Signed by:   Lewanda Rife LPN on 16/06/9603   Method used:   Telephoned to ...       [facility] (retail)       [address]       [place], [place]  [id number]       Ph: [phone]       Fax: [phone]   RxID:   [id number]

## 2010-10-08 NOTE — Assessment & Plan Note (Signed)
Summary: PAIN IN BOTH FEET/CLE   Vital Signs:  Patient profile:   65 year old female Height:      63.25 inches Weight:      118.25 pounds BMI:     20.86 Temp:     98.8 degrees F oral Pulse rate:   88 / minute Pulse rhythm:   regular BP sitting:   124 / 80  (left arm) Cuff size:   regular  Vitals Entered By: Lewanda Rife (February 19, 2009 3:19 PM)  CC:  pain in both feet for one month. Saw Dr. Milinda Antis for this and coming back for recheck..  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  NAD Msk:  tender both soles of each foot, pain with flexion and extension, little pain wiwth inversion or eversion of the ankle Pulses:  R dorsalis pedis normal and L dorsalis pedis normal.   Extremities:  trace edema both ankles3pm Neurologic:  alert & oriented X3, sensation intact to light touch, and gait normal.   Skin:  turgor normal, color normal, and no rashes.   Psych:  normally interactive and good eye contact.     Impression & Recommendations:  Problem # 1:  PLANTAR FASCIITIS, BILATERAL (ICD-728.71) Assessment New will try on diclofenac qam with food  to see DrCopland if this does not help--may need to be injected--understands cobntinue to elevate as able and reduce NA+ Her updated medication list for this problem includes:    Diclofenac Sodium 75 Mg Tbec (Diclofenac sodium) .Marland Kitchen... 1 each morning with food for foot pain  Problem # 2:  OSTEOPOROSIS (ICD-733.00) Assessment: Comment Only she is concerned re use of steriods by injection--discussed difference  in by mouth and injection Her updated medication list for this problem includes:    Fosamax 70 Mg Tabs (Alendronate sodium) .Marland Kitchen... 1 by mouth every week as directed  Complete Medication List: 1)  Nortriptyline Hcl 75 Mg Caps (Nortriptyline hcl) .... Take one by mouth at bedtime with a 25 mg pill also 2)  Calcium 1200 Mg  .... Take one by mouth daily 3)  Levoxyl 75 Mcg Tabs (Levothyroxine sodium) .... One by mouth daily 4)  Zocor 80 Mg Tabs (Simvastatin) .Marland Kitchen.. 1 by mouth once daily 5)  Nortriptyline Hcl 25 Mg Caps (Nortriptyline hcl) .Marland Kitchen.. 1 by mouth at bedtime with her 75 mg capsule also 6)  Fosamax 70 Mg Tabs (Alendronate sodium) .Marland Kitchen.. 1 by mouth every week as directed 7)  Restoril 30 Mg Caps (Temazepam) .Marland Kitchen.. 1 by mouth at bedtime as needed insomnia 8)  Diclofenac Sodium 75 Mg Tbec (Diclofenac sodium) .Marland Kitchen.. 1 each morning with food for foot pain Prescriptions: DICLOFENAC SODIUM 75 MG TBEC (DICLOFENAC SODIUM) 1 each morning with food for foot pain  #30 x 1   Entered and Authorized by:   Gildardo Griffes FNP   Signed by:   Gildardo Griffes FNP on 02/19/2009   Method used:   Electronically to        [facility] (retail)  [address]       [place], [place]  [address]       Ph: [phone]       Fax: [phone]   RxID:   [id number]   Current Allergies (reviewed today): No known allergies

## 2010-10-08 NOTE — Progress Notes (Signed)
Summary: nortriptyline refill  Phone Note Refill Request Message from:  Fax from Pharmacy on June 26, 2008 2:48 PM  Refills Requested: Medication #1:  NORTRIPTYLINE HCL 75 MG CAPS Take one by mouth q hs with a 25 mg pill also [facility]   Initial call taken by: Liane Comber,  June 26, 2008 2:48 PM  Follow-up for Phone Call        px written on EMR for call in  Follow-up by: Judith Part MD,  June 26, 2008 5:15 PM  Additional Follow-up for Phone Call Additional follow up Details #1::        Rx faxed to pharmacy Additional Follow-up by: Liane Comber,  June 26, 2008 5:17 PM    New/Updated Medications: NORTRIPTYLINE HCL 75 MG CAPS (NORTRIPTYLINE HCL) Take one by mouth q hs with a 25 mg pill also   Prescriptions: NORTRIPTYLINE HCL 75 MG CAPS (NORTRIPTYLINE HCL) Take one by mouth q hs with a 25 mg pill also  #30 x 11   Entered and Authorized by:   Judith Part MD   Signed by:   Liane Comber on 06/26/2008   Method used:   Telephoned to ...       [facility] (retail)       [address]       [place], [place]  [id number]       Ph: [phone]       Fax: [phone]   RxID:   [id number]

## 2010-10-08 NOTE — Assessment & Plan Note (Signed)
Summary: 3 month follow up/rbh   Vital Signs:  Patient Profile:   65 Years Old Female Weight:      126 pounds Temp:     97.8 degrees F oral Pulse rate:   88 / minute Pulse rhythm:   regular BP sitting:   110 / 72  (left arm) Cuff size:   regular  Vitals Entered By: Lowella Petties (August 29, 2007 3:21 PM)                 Chief Complaint:  3 month follow up.  History of Present Illness: got a job at the [employer]- enjoys it so far- and enjoys people will be getting some insurance as well- and has some forms to fill out  has lost some wt since last visit- had stomach virus overall eating good feels ok in general- neck is still a little numb- no larger   had a stomach virus last week- with vomiting- still a little nauseated but overall better- caused her to loose some wt  is doing good with smoking- has cut down to 3 daily- plans to quit jan 1  feeling good overall legs have been doing well- no more swelling in her legs  Current Allergies: No known allergies      Review of Systems  CV      Denies chest pain or discomfort.  Resp      Denies cough and shortness of breath.  Endo      Denies cold intolerance, excessive hunger, excessive thirst, and excessive urination.   Physical Exam  General:     slim and well appearing Head:     normocephalic, atraumatic, and no abnormalities observed.   Eyes:     vision grossly intact, pupils equal, pupils round, and pupils reactive to light.   Mouth:     pharynx pink and moist.   Neck:     goiter is noted to be smaller in size and nt no thyroid bruit supple, no JVD, and no carotid bruits.   Chest Wall:     No deformities, masses, or tenderness noted. Lungs:     diffusely distant bs without rales/crackles/wheeze Heart:     Normal rate and regular rhythm. S1 and S2 normal without gallop, murmur, click, rub or other extra sounds. Abdomen:     soft, non-tender, and normal bowel sounds.   Pulses:  pedal pulses are plus one bilat Extremities:     No clubbing, cyanosis, edema, or deformity noted with normal full range of motion of all joints.   Neurologic:     cranial nerves II-XII intact, strength normal in all extremities, gait normal, and DTRs symmetrical and normal.  no tremor Skin:     Intact without suspicious lesions or rashes Cervical Nodes:     No lymphadenopathy noted Psych:     nl affect, pleasant cheerful    Impression & Recommendations:  Problem # 1:  Hx of THYROIDITIS (ICD-245.9) with no clinical changes except wt loss (which may be multifactorial) goiter appears to be smaller in size will check tsh today and follow up for health mt exam Orders: Venipuncture (04540) TLB-TSH (Thyroid Stimulating Hormone) (84443-TSH)   Problem # 2:  Hx of HYPERCHOLESTEROLEMIA (ICD-272.0) on zocor with good diet will check labs today Her updated medication list for this problem includes:    Zocor 80 Mg Tabs (Simvastatin) .Marland Kitchen... 1 by mouth qd  Orders: Venipuncture (98119) TLB-Lipid Panel (80061-LIPID)   Problem # 3:  Preventive Health  Care (ICD-V70.0) will check labs in anticipation of wellness exam form filled out for insurance/job indicating intent also will be sending records to insurance company  Problem # 4:  EDEMA LEG (ICD-782.3) resolved with tx of hypothyroidism- will continue to watch closely  Complete Medication List: 1)  Nortriptyline Hcl 75 Mg Caps (Nortriptyline hcl) .... Take one by mouth q hs 2)  Calcium 1200 Mg  .... Take one by mouth daily 3)  Levoxyl 75 Mcg Tabs (Levothyroxine sodium) .Marland Kitchen.. 1 by mouth qd 4)  Zocor 80 Mg Tabs (Simvastatin) .Marland Kitchen.. 1 by mouth qd  Other Orders: TLB-BMP (Basic Metabolic Panel-BMET) (80048-METABOL) TLB-CBC Platelet - w/Differential (85025-CBCD) TLB-Hepatic/Liver Function Pnl (80076-HEPATIC)   Patient Instructions: 1)  please schedule health mt exam after the holidays 2)  we are doing labs today for thyroid and general  wellness 3)  I will let you know if we need to change any medicine doses before follow up    ]

## 2010-10-08 NOTE — Assessment & Plan Note (Signed)
Summary: LEFT FOOT/EVE   Vital Signs:  Patient Profile:   65 Years Old Female Weight:      138 pounds Temp:     98.1 degrees F oral Pulse rate:   88 / minute Pulse rhythm:   regular BP sitting:   110 / 70  (left arm) Cuff size:   regular  Vitals Entered By: Lowella Petties (February 10, 2007 12:24 PM)               Chief Complaint:  Left foot hurting and swollen.  History of Present Illness: left foot swells and hurts worse for a couple of weeks, no trauma and no new activity sharp pains start in post ankle and shoot up her leg (knee feels ok) has never had this before sometimes it feels like it is going to sleep, and a little cold, some limping from the pain, but no focal weakness  is on zocor now but still quite expensive, she has labs coming up soon she still smokes, but has cut way down to 2 cig daily denies sob or cp  she does have some baseline pain in calves occ when walking  Current Allergies: No known allergies      Review of Systems  General      Denies chills, fatigue, and fever.  CV      Denies chest pain or discomfort and shortness of breath with exertion.  Resp      Complains of cough.      Denies shortness of breath.      occ has a cough, with phlegm that is yellow, has cut down to 2 cig daily (almost quit)  GI      Denies abdominal pain, nausea, and vomiting.      n/v is better, but sometimes abdoment feels tight or swollen  zantac has helped  MS      Complains of low back pain.      Denies stiffness.      low back hurts too, almost all the time  Derm      Denies rash.   Physical Exam  General:     Well-developed,well-nourished,in no acute distress; alert,appropriate and cooperative throughout examination Eyes:     no icterus or pallor Mouth:     MMM Neck:     s/p thyroidectomy healing wellno JVD.   Chest Wall:     No deformities, masses, or tenderness noted. Lungs:     very distant bs, no rales/rhonchi/wheeze Heart:  Normal rate and regular rhythm. S1 and S2 normal without gallop, murmur, click, rub or other extra sounds. Msk:     L ankle has trace to 1 plus edema,, with nl rom, and difficult to palp pulses (temp is the same in both feet) no calf or leg tenderness, no focal redness pos  homann's sign in that leg Pulses:     pedal pulses not well palp inL foot R foot are plus one Extremities:     see above Neurologic:     sensation intact to light touch, gait normal, and DTRs symmetrical and normal.  no tremor Skin:     turgor normal, color normal, and no rashes.   Cervical Nodes:     No lymphadenopathy noted Psych:     nl affect, pleasant    Impression & Recommendations:  Problem # 1:  LEG PAIN, LEFT (ICD-729.5) with edema that is worse in L leg, and recent thyroid sx need to check venous doppler to r/o dvt  if neg and sympt continue, PVD is in differential (sounds like she may have some claudication as well) Orders: LE Venous Duplex (DVT) (DVT)   Problem # 2:  TOBACCO ABUSE (ICD-305.1) almost quit disc various risks of smoking including blood clots she plans to quit very soon  Medications Added to Medication List This Visit: 1)  Zocor 80 Mg Tabs (Simvastatin) .Marland Kitchen.. 1 by mouth qd   Patient Instructions: 1)  we will schedule an ultrasound of your leg (doppler) when you check out today 2)  if pain worsens, or you get any chest pain or shortness of breath, call or seek care 3)  keep working on quitting smoking

## 2010-10-08 NOTE — Assessment & Plan Note (Signed)
Summary: KNOT IN THE NECK/SD   Vital Signs:  Patient Profile:   65 Years Old Female Height:     63.25 inches Weight:      116.75 pounds Temp:     97.8 degrees F oral Pulse rate:   80 / minute Pulse rhythm:   regular BP sitting:   118 / 74  (left arm) Cuff size:   regular  Vitals Entered By: Delilah Shan (June 21, 2008 3:24 PM)                 Chief Complaint:  Knot on right side of neck.  History of Present Illness: tsh was very low at last visit and then high at check 9/23-- moved thyroid dose back to 50 micrograms daily no missed doses feels ok -- but cannot sleep well again  Remus Loffler is not working like it used to  has been eating supper -- 6 pm-- then does not eat anything after that takes her well over an hour or 2 to go to sleep  about a month  ? correlates with thyroid dose increase   knot in side of neck -- since yesterday is not sore to touch  has had little runny nose and raw throat  no cigarette for 2 days     Current Allergies (reviewed today): No known allergies   Past Medical History:    Reviewed history from 01/10/2008 and no changes required:       Depression       Hypothyroidism       Osteoarthritis       Osteoporosis       tab abuse       sleep disorder  Past Surgical History:    Reviewed history from 02/09/2007 and no changes required:       Hysterectomy- cervical ca cells       Left wrist fracture- surgery       Bunions (06/1998)       Hashimotos (02/1997)       Dexa- OP (12/1996)       Thyroid biopsy- pos neoplasm, surgery (09/2006)       CXR- COPD, TS partial compression fracture (12/2006)       Right thyroid lobectomy, goiter, thyroiditis (12/2006)   Family History:    Reviewed history from 11/10/2007 and no changes required:       Father: died of stroke at 30, HTN       Mother: DM       Siblings: brother drowned 18, boat accident       brother with DM  Social History:    Reviewed history from 11/10/2007 and no  changes required:       Marital Status: widowed       Children: 3       Occupation:        works at the [employer]       quit smoking 10/09    Review of Systems  General      Complains of fatigue and sleep disorder.      Denies fever, loss of appetite, malaise, weakness, and weight loss.  Eyes      Denies blurring and eye pain.  CV      Denies chest pain or discomfort and lightheadness.  Resp      Denies cough, pleuritic, shortness of breath, and wheezing.  GI      Denies abdominal pain, constipation, and diarrhea.  Derm      Denies itching,  lesion(s), and rash.  Neuro      Denies numbness and tingling.  Psych      Denies anxiety and depression.      mood is generally ok   Endo      Denies cold intolerance, excessive thirst, excessive urination, and heat intolerance.  Heme      Denies abnormal bruising and bleeding.   Physical Exam  General:     slim and somewhat frail appearing Head:     normocephalic, atraumatic, and no abnormalities observed.   Eyes:     vision grossly intact, pupils equal, pupils round, and pupils reactive to light.  no conjunctival pallor, injection or icterus  Ears:     R ear normal and L ear normal.   Nose:     nasal dischargemucosal pallor.  some clear rhinorrhea  Mouth:     pharynx pink and moist, no erythema, and no exudates.   Neck:     no change in goiter- incision well healed (prominent on the L) tender 1 cm R submandibular LN  Chest Wall:     No deformities, masses, or tenderness noted. Lungs:     diffusely distant bs without rales or wheeze  Heart:     Normal rate and regular rhythm. S1 and S2 normal without gallop, murmur, click, rub or other extra sounds. Abdomen:     Bowel sounds positive,abdomen soft and non-tender without masses, organomegaly or hernias noted. no ingunal adenopathy noted  Msk:     mild kyphosis , no acute joint changes  Extremities:     No clubbing, cyanosis, edema, or deformity noted with normal  full range of motion of all joints.   Neurologic:     sensation intact to light touch, gait normal, and DTRs symmetrical and normal.  no tremor  Skin:     Intact without suspicious lesions or rashes Cervical Nodes:     R submandibular node  Axillary Nodes:     No palpable lymphadenopathy Inguinal Nodes:     No significant adenopathy Psych:     normal affect, talkative and pleasant     Impression & Recommendations:  Problem # 1:  ENLARGEMENT OF LYMPH NODES (ICD-785.6) Assessment: New R submandibular- enlarged and tender some mild uri symptoms - could be secondary to this  will check cbc with diff  in light of smoking hx - low threshold to ref to ENT if not imp f/u in 2 wk for a re check   Orders: Venipuncture (11914) T-CBC w/Diff ([id number]) Orders: Venipuncture (86578) T-CBC w/Diff ([id number])   Problem # 2:  TOBACCO ABUSE (ICD-305.1) Assessment: Improved commended on great job so far with quitting   Problem # 3:  HYPOTHYROIDISM (ICD-244.9) Assessment: Deteriorated recent decrease and then increase in dose -- in light  of worsening insomnia , will re check tsh today and advise  Her updated medication list for this problem includes:    Levoxyl 50 Mcg Tabs (Levothyroxine sodium) ..... One by mouth daily   Problem # 4:  Hx of SLEEP DISORDER (ICD-780.50) Assessment: Deteriorated with ambien being no longer effective  adv to quit all caffiene and also green tea trial of restoril 30 as needed-- adv this is sedating - for as needed use only and use caution disc general sleep hygiene as well  Complete Medication List: 1)  Nortriptyline Hcl 75 Mg Caps (Nortriptyline hcl) .... Take one by mouth q hs with a 25 mg pill also 2)  Calcium 1200 Mg  .... Take  one by mouth daily 3)  Levoxyl 50 Mcg Tabs (Levothyroxine sodium) .... One by mouth daily 4)  Zocor 80 Mg Tabs (Simvastatin) .Marland Kitchen.. 1 by mouth qd 5)  Nortriptyline Hcl 25 Mg Caps (Nortriptyline hcl) .Marland Kitchen.. 1 by mouth at  bedtime with her 75 mg capsule also 6)  Fosamax 70 Mg Tabs (Alendronate sodium) .Marland Kitchen.. 1 by mouth q week as directed 7)  Restoril 30 Mg Caps (Temazepam) .Marland Kitchen.. 1 by mouth at bedtime as needed insomnia  Other Orders: T-TSH [id number])   Patient Instructions: 1)  continue same thyroid dose - until we call you about labs  2)  use the restoril for sleep with caution--is very sedating and can be habit forming- so only use it when you need it (stop the [place] and do not mix this with it) 3)  update me if lump in neck gets bigger or hurts more or if you get a fever  4)  follow up with me in 2 weeks to re check the lump 5)  good job with the quitting smoking ! keep up the good job    Prescriptions: RESTORIL 30 MG CAPS (TEMAZEPAM) 1 by mouth at bedtime as needed insomnia  #30 x 0   Entered and Authorized by:   Judith Part MD   Signed by:   Judith Part MD on 06/21/2008   Method used:   Print then Give to Patient   RxID:   [id number]  ] Current Allergies (reviewed today): No known allergies  Current Medications (including changes made in today's visit):  NORTRIPTYLINE HCL 75 MG CAPS (NORTRIPTYLINE HCL) Take one by mouth q hs with a 25 mg pill also * CALCIUM 1200 MG take one by mouth daily LEVOXYL 50 MCG TABS (LEVOTHYROXINE SODIUM) one by mouth daily ZOCOR 80 MG TABS (SIMVASTATIN) 1 by mouth qd NORTRIPTYLINE HCL 25 MG  CAPS (NORTRIPTYLINE HCL) 1 by mouth at bedtime with her 75 mg capsule also FOSAMAX 70 MG  TABS (ALENDRONATE SODIUM) 1 by mouth q week as directed RESTORIL 30 MG CAPS (TEMAZEPAM) 1 by mouth at bedtime as needed insomnia

## 2010-10-08 NOTE — Progress Notes (Signed)
Summary: indigestion  Phone Note Call from Patient Call back at Home Phone [phone]   Caller: Patient Call For: tower Summary of Call: update on meds its not doing too good, pt has indigestion feeling, then vomits. med is not helping Initial call taken by: Liane Comber,  Jan 05, 2007 10:13 AM  Follow-up for Phone Call        it sounds like prevacid didn't help, I would like her to try otc zantac (ranitidine) 150 mg twice daily, and if that doesn't help in 1-2 days call back for a GI referral Follow-up by: Judith Part MD,  Jan 05, 2007 12:54 PM  Additional Follow-up for Phone Call Additional follow up Details #1::        LEFT MESSAGE ON MACHINE ..................................................................Marland KitchenMarcelle Smiling Chavers  Jan 05, 2007 1:37 PM  PATIENT ADVISED ..................................................................Marland KitchenLiane Comber  Jan 05, 2007 4:14 PM

## 2010-10-08 NOTE — Assessment & Plan Note (Signed)
Summary: 3 MONTH F/U/HEA   Vital Signs:  Patient Profile:   65 Years Old Female Height:     63.25 inches Weight:      116 pounds Temp:     97.9 degrees F oral Pulse rate:   80 / minute Pulse rhythm:   regular BP sitting:   120 / 68  (left arm) Cuff size:   regular  Vitals Entered By: Liane Comber (April 12, 2008 3:27 PM)                 Chief Complaint:  3 mo f/u.  History of Present Illness: has been feeling ok  has used [place] -- still has some left -- works well when she needs it  no trouble with fosamax takes is compliantly  no change in her thyroid symptoms - feels fine has lost a few pounds- appetite is fine, however   is really close to quitting smoking  is down to 1 cigarette per day -- in the am  does not have a quit date yet     Current Allergies (reviewed today): No known allergies   Past Medical History:    Reviewed history from 01/10/2008 and no changes required:       Depression       Hypothyroidism       Osteoarthritis       Osteoporosis       tab abuse       sleep disorder  Past Surgical History:    Reviewed history from 02/09/2007 and no changes required:       Hysterectomy- cervical ca cells       Left wrist fracture- surgery       Bunions (06/1998)       Hashimotos (02/1997)       Dexa- OP (12/1996)       Thyroid biopsy- pos neoplasm, surgery (09/2006)       CXR- COPD, TS partial compression fracture (12/2006)       Right thyroid lobectomy, goiter, thyroiditis (12/2006)   Family History:    Reviewed history from 11/10/2007 and no changes required:       Father: died of stroke at 37, HTN       Mother: DM       Siblings: brother drowned 35, boat accident       brother with DM  Social History:    Reviewed history from 11/10/2007 and no changes required:       Marital Status: widowed       Children: 3       Occupation:        works at the [employer]       smokes 1-2 cig daily    Review of Systems  General      Denies  chills, fatigue, fever, loss of appetite, and malaise.  Eyes      Denies blurring.  CV      Denies chest pain or discomfort, palpitations, and shortness of breath with exertion.  Resp      Denies cough.  GI      Denies abdominal pain, bloody stools, and change in bowel habits.  Derm      Denies itching, lesion(s), and rash.  Neuro      Denies numbness, tingling, and tremors.  Psych      mood has been good lately  Endo      Denies cold intolerance and heat intolerance.   Physical Exam  General:  slim and somewhat frail appearing Head:     normocephalic, atraumatic, and no abnormalities observed.   Neck:     no change in goiter- incision well healed Lungs:     diffusely distant bs without rales or wheeze  Heart:     Normal rate and regular rhythm. S1 and S2 normal without gallop, murmur, click, rub or other extra sounds. Msk:     mild kyphosis  Pulses:     plus one pedal pulses  Extremities:     No clubbing, cyanosis, edema, or deformity noted with normal full range of motion of all joints.   Neurologic:     sensation intact to light touch, gait normal, and DTRs symmetrical and normal.  no tremor  Skin:     Intact without suspicious lesions or rashes Cervical Nodes:     No lymphadenopathy noted Psych:     normal affect, talkative and pleasant     Impression & Recommendations:  Problem # 1:  TOBACCO ABUSE (ICD-305.1) Assessment: Improved doing very well on 1 cig per day-- encouraged her to set quit date  discussed in detail risks of smoking, and possible outcomes including COPD, vascular dz, cancer and also respiratory infections/sinus problems     Problem # 2:  Hx of HYPERCHOLESTEROLEMIA (ICD-272.0) Assessment: Unchanged has been fairly well controlled with good diet and zocor sched labs in 6 mo for lipids and then f/u  Her updated medication list for this problem includes:    Zocor 80 Mg Tabs (Simvastatin) .Marland Kitchen... 1 by mouth qd  Labs  Reviewed: Chol: 168 (08/29/2007)   HDL: 49.3 (08/29/2007)   LDL: 111 (08/29/2007)   TG: 41 (08/29/2007) SGOT: 22 (08/29/2007)   SGPT: 16 (08/29/2007)   Problem # 3:  Hx of SLEEP DISORDER (ICD-780.50) Assessment: Improved better lately overall-- does not need ambien often  overall good sleep hygiene  will continue to monitor  Problem # 4:  HYPOTHYROIDISM (ICD-244.9) Assessment: Unchanged overall clinically stable since last dose adjustment  tsh today and advise  Her updated medication list for this problem includes:    Levoxyl 50 Mcg Tabs (Levothyroxine sodium) ..... One by mouth daily  Orders: Venipuncture (09811) T-TSH ([id number])   Problem # 5:  OSTEOPOROSIS (ICD-733.00) Assessment: Unchanged no problems with fosamax and ca/D Her updated medication list for this problem includes:    Fosamax 70 Mg Tabs (Alendronate sodium) .Marland Kitchen... 1 by mouth q week as directed   Complete Medication List: 1)  Nortriptyline Hcl 75 Mg Caps (Nortriptyline hcl) .... Take one by mouth q hs with a 25 mg pill also 2)  Calcium 1200 Mg  .... Take one by mouth daily 3)  Levoxyl 50 Mcg Tabs (Levothyroxine sodium) .... One by mouth daily 4)  Zocor 80 Mg Tabs (Simvastatin) .Marland Kitchen.. 1 by mouth qd 5)  Nortriptyline Hcl 25 Mg Caps (Nortriptyline hcl) .Marland Kitchen.. 1 by mouth at bedtime with her 75 mg capsule also 6)  Ambien 10 Mg Tabs (Zolpidem tartrate) .... 1/2 to 1 by mouth at bedtime as needed insomnia 7)  Fosamax 70 Mg Tabs (Alendronate sodium) .Marland Kitchen.. 1 by mouth q week as directed   Patient Instructions: 1)  schedule labs in 3 months and then follow up (lipid/ast/alt/renal/tsh 272, 244.9) 2)  keep working on quitting smoking    ]

## 2010-10-08 NOTE — Assessment & Plan Note (Signed)
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 She primarily now has PT pain, and her PF pain has improved with the MT arch pads that we applied last time. It is still there, but improved.  She also does have swelling in the ankle diffusely on the medial and lateral aspects without any known history of trauma.  She is status post bunionectomy on the left. She has a prominent bunion on the right.  Still having significant limitation at work   REVIEW OF SYSTEMS  GEN: No systemic complaints, no fevers, chills, sweats, or other acute illnesses MSK: Detailed in the HPI GI: tolerating PO intake without difficulty Neuro: No numbness, parasthesias, or tingling associated. Otherwise the pertinent positives of the ROS are noted above.      Allergies (verified): No Known Drug Allergies  Past History:  Past medical, surgical, family and social histories (including risk factors) reviewed, and no changes noted (except as noted below).  Past Medical History: Reviewed history from 07/08/2008 and no changes required. Depression Hypothyroidism Osteoarthritis Osteoporosis tab abuse- past (quit 09) sleep disorder  Past Surgical History: Reviewed history from 02/09/2007 and no changes required. Hysterectomy- cervical ca cells Left wrist fracture- surgery Bunions (06/1998) Hashimotos (02/1997) Dexa- OP (12/1996) Thyroid  biopsy- pos neoplasm, surgery (09/2006) CXR- COPD, TS partial compression fracture (12/2006) Right thyroid lobectomy, goiter, thyroiditis (12/2006)  Family History: Reviewed history from 11/10/2007 and no changes required. Father: died of stroke at 74, HTN Mother: DM Siblings: brother drowned 18, boat accident brother with DM  Social History: Reviewed history from 06/21/2008 and no changes required. Marital Status: widowed Children: 3 Occupation:  works at the [employer] quit smoking 10/09  Physical Exam  General:  GEN: Well-developed,well-nourished,in no acute distress; alert,appropriate and cooperative throughout examination HEENT: Normocephalic and atraumatic without obvious abnormalities. No apparent alopecia or balding. Ears, externally no deformities PULM: Breathing comfortably in no respiratory distress EXT: No clubbing, cyanosis, or edema PSYCH: Normally interactive. Cooperative during the interview. Pleasant. Friendly and conversant. Not anxious or depressed appearing. Normal, full affect.  Msk:  Bilateral feet: Left foot: Mildly tender it bilateral malleoli, however there is soft tissue swelling diffusely around the medial anterior and lateral ankle, there is some tenderness with forefoot compression, there is tenderness and along the peroneal and along the posterior tibialis tendons. there is some significant loss of range of motion at the great toe. Bunionette formation is present. Diffuse 2 through 4 metatarsal heads it dropped. Patient also significantly pronates while walking.  Right foot is essentially as above, however the patient does have a prominent bunion in addition to the above findings. Today, the R PT and true ankle are the most tender areas  In both feet, there is significant pain at the insertion of the  plantar fascia on the calcaneus with palpation on the plantar aspect and medially, but this is better compared to prior exam. There's also pain with forced  dorsiflexion. There is no tenderness at the Achilles or in the retrocalcaneal bursa area.  The patient does walk with a limp and pronates significantly bilaterally.   Impression & Recommendations:  Problem # 1:  TIBIALIS TENDINITIS (ICD-726.72)  Will add nitroglycerine patches for placement on R PT tendon, c/w rehab as tolerated  Orders: Orthotic Materials, each unit (Z6109)  Problem # 2:  PLANTAR FASCIITIS, BILATERAL (ICD-728.71) Assessment: Improved  Patient was fitted for a standard, cushioned, semi-rigid orthotic.  The orthotic was heated and the patient stood on the orthotic blank positioned on the orthotic stand. The patient was positioned in subtalar neutral position and 10 degrees of ankle dorsiflexion in a weight bearing stance. After molding, a stable Fast-Tech EVA base was applied to the orthotic blank.   The blank was ground to a stable position for weight bearing. size: 7 M base: F2 foottech posting: R 1st ray post additional orthotic padding: B metatarsal pads  Her updated medication list for this problem includes:    Diclofenac Sodium 75 Mg Tbec (Diclofenac sodium) .Marland Kitchen... 1 each morning with food for foot pain  Orders: Orthotic Materials, each unit (L3002) Metatarsal Pads (U0454)  Problem # 3:  BUNION (ICD-727.1) Assessment: New R 1st ray post  Complete Medication List: 1)  Nortriptyline Hcl 75 Mg Caps (Nortriptyline hcl) .... Take one by mouth at bedtime with a 25 mg pill also 2)  Calcium 1200 Mg  .... Take one by mouth daily 3)  Levoxyl 75 Mcg Tabs (Levothyroxine sodium) .... One by mouth daily 4)  Zocor 80 Mg Tabs (Simvastatin) .Marland Kitchen.. 1 by mouth once daily 5)  Nortriptyline Hcl 25 Mg Caps (Nortriptyline hcl) .Marland Kitchen.. 1 by mouth at bedtime with her 75 mg capsule also 6)  Fosamax 70 Mg Tabs (Alendronate sodium) .Marland Kitchen.. 1 by mouth every week as directed 7)  Restoril 30 Mg Caps (Temazepam) .Marland Kitchen.. 1 by mouth at bedtime as needed insomnia 8)  Diclofenac Sodium 75 Mg Tbec  (Diclofenac sodium) .Marland Kitchen.. 1 each morning with food for foot pain 9)  Nitroglycerin 0.1 Mg/hr Pt24 (Nitroglycerin) .... 1/4 of a patch applied daily as instructed  Patient Instructions: 1)  f/u 6-8 weeks Prescriptions: NITROGLYCERIN 0.1 MG/HR PT24 (NITROGLYCERIN) 1/4 of a patch applied daily as instructed  #10 x 3   Entered and Authorized by:   Hannah Beat MD   Signed by:   Hannah Beat MD on 03/25/2009   Method used:   Electronically to        [facility] (retail)       [address]       [place], [place]  [id number]       Ph: [phone]       Fax: [phone]   RxID:   [id number]   Current Allergies (reviewed today): No known allergies

## 2010-10-08 NOTE — Letter (Signed)
Summary: [place] No Show Letter  [place] [facility]  [address] [place], [place] [address]   Phone: [phone]  Fax: [phone]    07/09/2008 MRN: [id number]  [facility] [address] [place], [place]  [address]   Dear Ms. Elwell,   Our records indicate that you missed your scheduled appointment with __LAB___________________ on _11.03.09___________.  Please contact this office to reschedule your appointment as soon as possible.  It is important that you keep your scheduled appointments with your physician, so we can provide you the best care possible.  Please be advised that there may be a charge for "no show" appointments.    Sincerely,    at [facility]

## 2010-10-08 NOTE — Assessment & Plan Note (Signed)
Summary: ?SINUS INFECTION/CLE   Vital Signs:  Patient profile:   65 year old female Height:      63.25 inches Weight:      121 pounds BMI:     21.34 Temp:     98.2 degrees F oral Pulse rate:   84 / minute Pulse rhythm:   regular BP sitting:   124 / 78  (left arm) Cuff size:   regular  Vitals Entered By: Delilah Shan CMA Duncan Dull) (May 25, 2010 11:01 AM) CC: ? sinus infection   History of Present Illness: Cough and chills.  Started Saturday.  "I was feeling bad and coughing."  No fevers.  No ear pain.  Feels tight across the nose.  Some better in a hot shower.  Still using veramyst.  Feels achy.  No voice change.  No known sick contacts.  On disablilty due to feet and legs.  Quit smoking about 1 year ago.  Feels better today than yesterday.   Allergies: No Known Drug Allergies  Past History:  Past Medical History: Last updated: 07/08/2008 Depression Hypothyroidism Osteoarthritis Osteoporosis tab abuse- past (quit 09) sleep disorder  Review of Systems       See HPI.  Otherwise negative.    Physical Exam  General:  GEN: nad, alert and oriented HEENT: mucous membranes moist, TM w/o erythema, nasal epithelium injected, OP with cobblestoning NECK: supple w/o LA CV: rrr. PULM: ctab, no inc wob ABD: soft, +bs EXT: no edema    Impression & Recommendations:  Problem # 1:  SINUSITIS, ACUTE (ICD-461.9) Cont nasal steroid and hold rx for amoxil.  I think this is going to resolve on its own.  LIkely viral.  She is some better today compared to yesterday.  Use amoxil if symptoms persist through the end of the week.  Destroy rx o/w.  Her updated medication list for this problem includes:    Veramyst 27.5 Mcg/spray Susp (Fluticasone furoate) .Marland Kitchen... 1 spray in each nostril once daily    Amoxicillin 875 Mg Tabs (Amoxicillin) .Marland Kitchen... 1 by mouth two times a day x10d.  Complete Medication List: 1)  Nortriptyline Hcl 75 Mg Caps (Nortriptyline hcl) .... Take one by mouth at  bedtime with a 25 mg pill also 2)  Calcium 1200 Mg  .... Take one by mouth daily 3)  Zocor 80 Mg Tabs (Simvastatin) .Marland Kitchen.. 1 by mouth once daily 4)  Nortriptyline Hcl 25 Mg Caps (Nortriptyline hcl) .Marland Kitchen.. 1 by mouth at bedtime with her 75 mg capsule also 5)  Fosamax 70 Mg Tabs (Alendronate sodium) .Marland Kitchen.. 1 by mouth every week as directed 6)  Restoril 30 Mg Caps (Temazepam) .Marland Kitchen.. 1 by mouth at bedtime as needed insomnia 7)  Diclofenac Sodium 75 Mg Tbec (Diclofenac sodium) .Marland Kitchen.. 1 each morning with food for foot pain 8)  Nitroglycerin 0.1 Mg/hr Pt24 (Nitroglycerin) .... 1/4 of a patch applied daily as instructed 9)  Zestril 10 Mg Tabs (Lisinopril) .... 1/2 by mouth once daily 10)  Levoxyl 75 Mcg Tabs (Levothyroxine sodium) .Marland Kitchen.. 1 by mouth once daily 11)  Veramyst 27.5 Mcg/spray Susp (Fluticasone furoate) .Marland Kitchen.. 1 spray in each nostril once daily 12)  Amoxicillin 875 Mg Tabs (Amoxicillin) .Marland Kitchen.. 1 by mouth two times a day x10d.  Patient Instructions: 1)  Get some rest, drink plenty of fluids, and don't fill the amoxicillin prescription yet.  I think you'll feel better in a few days.  If you aren't better at the end of the week, fill the antibiotic.  Keep  using your nasal spray.  Prescriptions: AMOXICILLIN 875 MG TABS (AMOXICILLIN) 1 by mouth two times a day x10d.  #20 x 0   Entered and Authorized by:   Crawford Givens MD   Signed by:   Crawford Givens MD on 05/25/2010   Method used:   Print then Give to Patient   RxID:   [id number]   Current Allergies (reviewed today): No known allergies

## 2010-10-08 NOTE — Progress Notes (Signed)
Summary: refill request for temazepam  Phone Note From Pharmacy Message from:  Fax from Pharmacy  Caller: [facility] Summary of Call: Pharmacy is asking for a refill on temazepam, this is not on med list. Initial call taken by: Lowella Petties CMA,  November 18, 2009 11:27 AM  Follow-up for Phone Call        px written on EMR for call in  Follow-up by: Judith Part MD,  November 18, 2009 11:53 AM  Additional Follow-up for Phone Call Additional follow up Details #1::        Medication phoned to [facility] pharmacy as instructed. Lewanda Rife LPN  November 18, 2009 12:17 PM     Prescriptions: RESTORIL 30 MG CAPS (TEMAZEPAM) 1 by mouth at bedtime as needed insomnia  #30 x 0   Entered and Authorized by:   Judith Part MD   Signed by:   Lewanda Rife LPN on 04/54/0981   Method used:   Telephoned to ...       [facility] (retail)       [address]       [place], [place]  [id number]       Ph: [phone]       Fax: [phone]   RxID:   [id number]

## 2010-10-08 NOTE — Assessment & Plan Note (Signed)
Summary: 3 M F/U  DLO   Vital Signs:  Patient Profile:   65 Years Old Female Height:     63.25 inches Weight:      119 pounds Temp:     97.5 degrees F oral Pulse rate:   84 / minute Pulse rhythm:   regular BP sitting:   120 / 70  (left arm) Cuff size:   regular  Vitals Entered By: Liane Comber (July 18, 2008 3:23 PM)                 Chief Complaint:  3 mo f/u.  History of Present Illness: thyroid check today  sleep is much better   is actually just for labs today- does not need office visit pt sent to the lab for tsh    Current Allergies (reviewed today): No known allergies         Impression & Recommendations:  Problem # 1:  HYPOTHYROIDISM (ICD-244.9) tsh and bmp and update will adj med prn Her updated medication list for this problem includes:    Levoxyl 75 Mcg Tabs (Levothyroxine sodium) ..... One by mouth daily  Orders: Venipuncture (32440) TLB-FSH (Follicle Stimulating Hormone) (83001-FSH) TLB-BMP (Basic Metabolic Panel-BMET) (80048-METABOL) No Charge Patient Arrived (NCPA0) (NCPA0)   Complete Medication List: 1)  Nortriptyline Hcl 75 Mg Caps (Nortriptyline hcl) .... Take one by mouth q hs with a 25 mg pill also 2)  Calcium 1200 Mg  .... Take one by mouth daily 3)  Levoxyl 75 Mcg Tabs (Levothyroxine sodium) .... One by mouth daily 4)  Zocor 80 Mg Tabs (Simvastatin) .Marland Kitchen.. 1 by mouth qd 5)  Nortriptyline Hcl 25 Mg Caps (Nortriptyline hcl) .Marland Kitchen.. 1 by mouth at bedtime with her 75 mg capsule also 6)  Fosamax 70 Mg Tabs (Alendronate sodium) .Marland Kitchen.. 1 by mouth q week as directed 7)  Restoril 30 Mg Caps (Temazepam) .Marland Kitchen.. 1 by mouth at bedtime as needed insomnia    ]  Appended Document: Orders Update    Clinical Lists Changes  Orders: Added new Test order of TLB-TSH (Thyroid Stimulating Hormone) [phone]) - Signed

## 2010-10-08 NOTE — Assessment & Plan Note (Signed)
Summary: 6 m f/u dlo   Vital Signs:  Patient profile:   65 year old female Height:      63.25 inches Weight:      122 pounds BMI:     21.52 Temp:     97.7 degrees F oral Pulse rate:   92 / minute Pulse rhythm:   regular BP sitting:   100 / 60  (left arm) Cuff size:   regular  Vitals Entered By: Liane Comber (Jan 10, 2009 3:49 PM)  History of Present Illness: here for f/u of hypothyroidism and hyperlipidemia  has new foot problem lump on back of her neck  on levoxyl 75 tsh has been slowly imp over past year - after under supplementation  wt is up several lb bp is good  feels about the same  is eating well   on zocor for chol Last Lipid ProfileCholesterol: 168 (08/29/2007 3:41:00 PM)HDL:  49.3 (08/29/2007 3:41:00 PM)LDL:  111 (08/29/2007 3:41:00 PM)Triglycerides:  Last Liver profileSGOT:  22 (08/29/2007 3:41:00 PM)SPGT:  16 (08/29/2007 3:41:00 PM)T. Bili:  0.7 (08/29/2007 3:41:00 PM)Alk Phos:  63 (08/29/2007 3:41:00 PM)  is due to have re checked  diet - does stay away from saturated fats   is sleeping better but needs refil of restoril 30 - does not take often - about every 2-3 weeks    having trouble with R heel for 2-3 weeks did not injure it  bottom of heel- hurts worse first in am and when walking ? if swollen  never had before  no meds for this , or ice  wears new balance shoes for work   still not smoking - going very well  staying away from people who smoke   lump on back of nect- new not painful or bothersome- wants to know what it is / if she should worry about it   Allergies: No Known Drug Allergies  Past History:  Past Medical History:    Depression    Hypothyroidism    Osteoarthritis    Osteoporosis    tab abuse- past (quit 09)    sleep disorder     (07/08/2008)  Past Surgical History:    Hysterectomy- cervical ca cells    Left wrist fracture- surgery    Bunions (06/1998)    Hashimotos (02/1997)    Dexa- OP (12/1996)    Thyroid  biopsy- pos neoplasm, surgery (09/2006)    CXR- COPD, TS partial compression fracture (12/2006)    Right thyroid lobectomy, goiter, thyroiditis (12/2006)     (02/09/2007)  Family History:    Father: died of stroke at 46, HTN    Mother: DM    Siblings: brother drowned 41, boat accident    brother with DM     (11/10/2007)  Social History:    Marital Status: widowed    Children: 3    Occupation:     works at the [employer]    quit smoking 10/09 (06/21/2008)  Review of Systems General:  Denies fatigue, fever, loss of appetite, and malaise. Eyes:  Denies blurring and eye pain. CV:  Denies chest pain or discomfort, palpitations, and shortness of breath with exertion. Resp:  Denies cough and wheezing. GI:  Denies abdominal pain and change in bowel habits. GU:  Denies urinary frequency. MS:  Denies joint redness, joint swelling, and cramps. Derm:  Denies lesion(s), poor wound healing, and rash; lump on back of neck- does not hurt. Neuro:  Denies numbness and tingling. Psych:  mood is  good . Endo:  Denies excessive thirst and excessive urination. Heme:  Denies abnormal bruising.  Physical Exam  General:  slim and well appearing  Head:  normocephalic, atraumatic, and no abnormalities observed.   Eyes:  vision grossly intact, pupils equal, pupils round, and pupils reactive to light.  no conjunctival pallor, injection or icterus  Mouth:  pharynx pink and moist.   Neck:  goiter is stable with overlying thyroid surg scar no JVD or carotid bruits nl rom Lungs:  diffusely distant bs , without rales or rhonchi or crackles fair air exch Heart:  RRR Abdomen:  no renal bruits soft, non-tender, normal bowel sounds, no hepatomegaly, and no splenomegaly.   Msk:  R foot  prominent med bunion tender over proximal heel/calcaneous  slt fullness, no erythema or warmth nl rom foot  no other bony tenderness  favors other foot with gait  Pulses:  plus one pedal pulses  Extremities:  no  CCE Neurologic:  sensation intact to light touch, gait normal, and DTRs symmetrical and normal.   Skin:  small seb cyst - at post/base of neck  less than 1 cm- no redness or drainage non tender Cervical Nodes:  No lymphadenopathy noted Inguinal Nodes:  No significant adenopathy Psych:  normal affect, talkative and pleasant     Impression & Recommendations:  Problem # 1:  HYPOTHYROIDISM (ICD-244.9) Assessment Unchanged  with hx of goiter and partial thyroidectomy in past  tsh today and adv [id number] is clinically stable  Her updated medication list for this problem includes:    Levoxyl 75 Mcg Tabs (Levothyroxine sodium) ..... One by mouth daily  Orders: Venipuncture (54098) Specimen Handling (11914) T-TSH [phone])  Labs Reviewed: TSH: 6.35 (07/18/2008)    Chol: 168 (08/29/2007)   HDL: 49.3 (08/29/2007)   LDL: 111 (08/29/2007)   TG: 41 (08/29/2007)  Problem # 2:  Hx of HYPERCHOLESTEROLEMIA (ICD-272.0) Assessment: Unchanged  on zocor and low sat fat diet - labs today Her updated medication list for this problem includes:    Zocor 80 Mg Tabs (Simvastatin) .Marland Kitchen... 1 by mouth once daily  Orders: Venipuncture (86578) Specimen Handling (46962) T-Lipid Profile [phone]) T-Renal Function Panel [phone]) T- * Misc. Laboratory test [phone]) T-TSH [phone])  Labs Reviewed: SGOT: 22 (08/29/2007)   SGPT: 16 (08/29/2007)   HDL:49.3 (08/29/2007), 65.2 (02/27/2007)  LDL:111 (08/29/2007), DEL (02/27/2007)  Chol:168 (08/29/2007), 237 (02/27/2007)  Trig:41 (08/29/2007), 67 (02/27/2007)  Problem # 3:  PLANTAR FASCIITIS, RIGHT (ICD-728.71) Assessment: New new, with heel pain R foot  will tx symptomatically  ice two times a day with pressure/ wear new balance shoes all the time (no barefoot) aleve  2 two times a day with food  if not 2 weeks will ref to Dr Dallas Schimke- consider orthotics   Problem # 4:  TOBACCO USE, QUIT (ICD-V15.82) Assessment: Comment Only commended  on remaining a non smoker   Problem # 5:  SEBACEOUS CYST, NECK (ICD-706.2) Assessment: New uninfected and not bothersome  will observe adv to update me if any inc size /redness or pain  Problem # 6:  Hx of SLEEP DISORDER (ICD-780.50) Assessment: Improved using restoril very infrequently (daily nortryptiline works very well) good sleep hygiene  will continue to monitor   Complete Medication List: 1)  Nortriptyline Hcl 75 Mg Caps (Nortriptyline hcl) .... Take one by mouth q hs with a 25 mg pill also 2)  Calcium 1200 Mg  .... Take one by mouth daily 3)  Levoxyl 75 Mcg Tabs (Levothyroxine sodium) .... One by  mouth daily 4)  Zocor 80 Mg Tabs (Simvastatin) .Marland Kitchen.. 1 by mouth once daily 5)  Nortriptyline Hcl 25 Mg Caps (Nortriptyline hcl) .Marland Kitchen.. 1 by mouth at bedtime with her 75 mg capsule also 6)  Fosamax 70 Mg Tabs (Alendronate sodium) .Marland Kitchen.. 1 by mouth q week as directed 7)  Restoril 30 Mg Caps (Temazepam) .Marland Kitchen.. 1 by mouth at bedtime as needed insomnia  Patient Instructions: 1)  labs today- will update you about levoxyl 2)  continue low fat diet  3)  use ice on your foot twice daily (best to roll over frozen can for 5-10 minutes) 4)  wear new balance tennis shoes all the time- even put by your bed, do not go barefoot 5)  try aleve 2 pills with food two times a day for 2 weeks ( stop it if you get stomach upset) 6)  if foot pain is not better in 2 weeks- call to update me and I will get you appt with our sports medicine doctor  Prescriptions: ZOCOR 80 MG TABS (SIMVASTATIN) 1 by mouth once daily  #30 x 11   Entered and Authorized by:   Judith Part MD   Signed by:   Judith Part MD on 01/10/2009   Method used:   Print then Give to Patient   RxID:   [id number] FOSAMAX 70 MG  TABS (ALENDRONATE SODIUM) 1 by mouth q week as directed  #4 x 11   Entered and Authorized by:   Judith Part MD   Signed by:   Judith Part MD on 01/10/2009   Method used:   Print then Give to Patient    RxID:   [id number] NORTRIPTYLINE HCL 25 MG  CAPS (NORTRIPTYLINE HCL) 1 by mouth at bedtime with her 75 mg capsule also  #30 x 11   Entered and Authorized by:   Judith Part MD   Signed by:   Judith Part MD on 01/10/2009   Method used:   Print then Give to Patient   RxID:   [id number] NORTRIPTYLINE HCL 75 MG CAPS (NORTRIPTYLINE HCL) Take one by mouth q hs with a 25 mg pill also  #30 x 11   Entered and Authorized by:   Judith Part MD   Signed by:   Judith Part MD on 01/10/2009   Method used:   Print then Give to Patient   RxID:   [id number] RESTORIL 30 MG CAPS (TEMAZEPAM) 1 by mouth at bedtime as needed insomnia  #30 x 0   Entered and Authorized by:   Judith Part MD   Signed by:   Judith Part MD on 01/10/2009   Method used:   Print then Give to Patient   RxID:   [id number]     Prior Medications (reviewed today): NORTRIPTYLINE HCL 75 MG CAPS (NORTRIPTYLINE HCL) Take one by mouth q hs with a 25 mg pill also CALCIUM 1200 MG () take one by mouth daily LEVOXYL 75 MCG TABS (LEVOTHYROXINE SODIUM) one by mouth daily ZOCOR 80 MG TABS (SIMVASTATIN) 1 by mouth once daily NORTRIPTYLINE HCL 25 MG  CAPS (NORTRIPTYLINE HCL) 1 by mouth at bedtime with her 75 mg capsule also FOSAMAX 70 MG  TABS (ALENDRONATE SODIUM) 1 by mouth q week as directed RESTORIL 30 MG CAPS (TEMAZEPAM) 1 by mouth at bedtime as needed insomnia Current Allergies (reviewed today): No known allergies  Current Medications (including changes made in today's visit):  NORTRIPTYLINE HCL 75 MG CAPS (  NORTRIPTYLINE HCL) Take one by mouth q hs with a 25 mg pill also * CALCIUM 1200 MG take one by mouth daily LEVOXYL 75 MCG TABS (LEVOTHYROXINE SODIUM) one by mouth daily ZOCOR 80 MG TABS (SIMVASTATIN) 1 by mouth once daily NORTRIPTYLINE HCL 25 MG  CAPS (NORTRIPTYLINE HCL) 1 by mouth at bedtime with her 75 mg capsule also FOSAMAX 70 MG  TABS (ALENDRONATE SODIUM) 1 by mouth q week as  directed RESTORIL 30 MG CAPS (TEMAZEPAM) 1 by mouth at bedtime as needed insomnia

## 2010-10-08 NOTE — Consult Note (Signed)
Summary: [facility]  [facility]   Imported By: Maryln Gottron 05/30/2009 15:21:02  _____________________________________________________________________  External Attachment:    Type:   Image     Comment:   External Document

## 2010-10-08 NOTE — Progress Notes (Signed)
Summary: Refill Nortriptyline HCL 75 mg  Phone Note From Pharmacy Call back at [phone]   Caller: [facility] Call For: Everrett Coombe, FNP  Summary of Call: Refill request for Nortriptyline HCL 75 mg., Take one by mouth at bedtime Initial call taken by: Sydell Axon,  October 02, 2007 12:36 PM  Follow-up for Phone Call        Rx called to pharmacy as instructed. Follow-up by: Sydell Axon,  October 02, 2007 2:42 PM      Prescriptions: NORTRIPTYLINE HCL 75 MG CAPS (NORTRIPTYLINE HCL) Take one by mouth q hs  #30 x 2   Entered and Authorized by:   Gildardo Griffes FNP   Signed by:   Gildardo Griffes FNP on 10/02/2007   Method used:   Print then Give to Patient   RxID:   [id number]

## 2010-10-08 NOTE — Assessment & Plan Note (Signed)
Summary: 2 WK F/U DLO   Vital Signs:  Patient Profile:   65 Years Old Female Height:     63.25 inches Weight:      120 pounds BMI:     21.17 Temp:     97.9 degrees F oral Pulse rate:   76 / minute Pulse rhythm:   regular BP sitting:   100 / 50  (left arm) Cuff size:   regular  Vitals Entered By: Liane Comber (July 08, 2008 3:38 PM)                 Chief Complaint:  2 wk f/u.  History of Present Illness: has only taken the restoril when she really needs them they do work well , when she needs it   went up on thyroid medicine last time-- feels fine on this  has been tired a lot   is still not smoking -- tries not to think about it  is helps that husband does not smoke  is doing very 3 weeks now without smoking   work is going well- still working   lump in her neck -- is a little smaller but not gone - and is no longer sore  she eats/ swallows , she can feel it more / seems bigger  no fever , feels good  no st or nasal d/c or sinus pain cbc was normal last check     Current Allergies (reviewed today): No known allergies   Past Medical History:    Reviewed history from 01/10/2008 and no changes required:       Depression       Hypothyroidism       Osteoarthritis       Osteoporosis       tab abuse- past (quit 09)       sleep disorder  Past Surgical History:    Reviewed history from 02/09/2007 and no changes required:       Hysterectomy- cervical ca cells       Left wrist fracture- surgery       Bunions (06/1998)       Hashimotos (02/1997)       Dexa- OP (12/1996)       Thyroid biopsy- pos neoplasm, surgery (09/2006)       CXR- COPD, TS partial compression fracture (12/2006)       Right thyroid lobectomy, goiter, thyroiditis (12/2006)   Family History:    Reviewed history from 11/10/2007 and no changes required:       Father: died of stroke at 99, HTN       Mother: DM       Siblings: brother drowned 13, boat accident       brother with  DM  Social History:    Reviewed history from 06/21/2008 and no changes required:       Marital Status: widowed       Children: 3       Occupation:        works at the [employer]       quit smoking 10/09    Review of Systems  General      Denies fatigue, loss of appetite, and malaise.  Eyes      Denies blurring and eye pain.  CV      Denies chest pain or discomfort, lightheadness, palpitations, and swelling of feet.  Resp      Denies cough and wheezing.  GI      Denies abdominal  pain and change in bowel habits.  MS      Denies joint pain.  Derm      Denies itching and rash.  Neuro      Denies numbness and tingling.  Psych      mood is ok  Endo      Denies excessive thirst and excessive urination.  Heme      Denies abnormal bruising.   Physical Exam  General:     slim and somewhat frail appearing Head:     normocephalic, atraumatic, and no abnormalities observed.   Eyes:     vision grossly intact, pupils equal, pupils round, and pupils reactive to light.  no conjunctival pallor, injection or icterus  Nose:     no nasal discharge.   Neck:     supple with full rom and no masses, no JVD or carotid  goiter s/p partial thyroidectomy is stable  previous LN is unpalpable Chest Wall:     No deformities, masses, or tenderness noted. Lungs:     diffusely distant bs without rales or wheeze  Heart:     Normal rate and regular rhythm. S1 and S2 normal without gallop, murmur, click, rub or other extra sounds. Abdomen:     soft and non-tender.   Msk:     mild kyphosis  Pulses:     R and L carotid,radial,femoral,dorsalis pedis and posterior tibial pulses are full and equal bilaterally Extremities:     No clubbing, cyanosis, edema, or deformity noted with normal full range of motion of all joints.   Neurologic:     sensation intact to light touch, gait normal, and DTRs symmetrical and normal.  no tremor  Skin:     Intact without suspicious lesions or  rashes Cervical Nodes:     No lymphadenopathy noted Psych:     normal affect, talkative and pleasant     Impression & Recommendations:  Problem # 1:  ENLARGEMENT OF LYMPH NODES (ICD-785.6) Assessment: Improved submandibular LN previously felt on R is unpalpable today adv to keep watching it -- and update if inc size or any tenderness   Problem # 2:  TOBACCO USE, QUIT (ICD-V15.82) Assessment: Improved quit for 3 weeks- commended on this-- doing very well  Problem # 3:  HYPOTHYROIDISM (ICD-244.9) Assessment: Improved recent dose inc with inc tsh-- and no change in goiter or exam  some fatigue- otherwise clinically stable will plan next tsh check in 2-4 weeks and then update  Her updated medication list for this problem includes:    Levoxyl 75 Mcg Tabs (Levothyroxine sodium) ..... One by mouth daily   Problem # 4:  Hx of SLEEP DISORDER (ICD-780.50) Assessment: Improved overall doing better  uses restoril occasionally when needed - and works well will continue to use only prn  Problem # 5:  Preventive Health Care (ICD-V70.0) Assessment: Comment Only flu shot today  Complete Medication List: 1)  Nortriptyline Hcl 75 Mg Caps (Nortriptyline hcl) .... Take one by mouth q hs with a 25 mg pill also 2)  Calcium 1200 Mg  .... Take one by mouth daily 3)  Levoxyl 75 Mcg Tabs (Levothyroxine sodium) .... One by mouth daily 4)  Zocor 80 Mg Tabs (Simvastatin) .Marland Kitchen.. 1 by mouth qd 5)  Nortriptyline Hcl 25 Mg Caps (Nortriptyline hcl) .Marland Kitchen.. 1 by mouth at bedtime with her 75 mg capsule also 6)  Fosamax 70 Mg Tabs (Alendronate sodium) .Marland Kitchen.. 1 by mouth q week as directed 7)  Restoril 30 Mg  Caps (Temazepam) .Marland Kitchen.. 1 by mouth at bedtime as needed insomnia   Patient Instructions: 1)  continue same thryoid dose 2)  schedule labs in 2-4 weeks for tsh 244.9 3)  follow up in about 6 months  4)  keep up the good job with quitting smoking 5)  if the lump in neck gets bigger or hurts - let me know- it  is better on exam today 6)  flu shot today   ]

## 2010-10-08 NOTE — Assessment & Plan Note (Signed)
Summary: PER DR COPLAND  CYD   Vital Signs:  Patient profile:   65 year old female Height:      63.25 inches Weight:      112.4 pounds BMI:     19.83 Temp:     98.4 degrees F oral Pulse rate:   80 / minute Pulse rhythm:   regular BP sitting:   150 / 100  (left arm) Cuff size:   regular  Vitals Entered By: Benny Lennert CMA Duncan Dull) (May 22, 2009 3:25 PM)  History of Present Illness: Chief complaint followup per dr. c  65 year old patient that I remember well who is very nice and presents in f/u post-MRI of R foot.  Has had a course of foot pain, initially seen after having for about 2 months, with some significant PF, and PT and peroneal tendonitis with significant forefoot breakdown and R bunion.  Some shoe modifications were placed and the patient was doing better about 1 month afterwards. Custom orthotics were fabricated, but pt has not worn much.  Came in last week worse, about 2 months since last visit, with more foot pain and forefoot pain. MRI obtained of R foot given level of concern to rule out stress fracture, and has now returned showing 2 stress fractures, but with significant T2 signal in 2nd - 5th Metatarsals.  MRI R foot, T2 signal, 2nd-5th MT with evidence of high grade stress reaction and stress fracture at 3rd metatarsal head and one proximal stress fracture of the 2nd. Appears to be partial PF rupture. Prominent bunion.  Patient also continues to have L foot pain as well.  History significant for osteoporosis.  Allergies (verified): No Known Drug Allergies  Past History:  Past medical, surgical, family and social histories (including risk factors) reviewed, and no changes noted (except as noted below).  Past Medical History: Reviewed history from 07/08/2008 and no changes required. Depression Hypothyroidism Osteoarthritis Osteoporosis tab abuse- past (quit 09) sleep disorder  Past Surgical History: Reviewed history from 02/09/2007 and no  changes required. Hysterectomy- cervical ca cells Left wrist fracture- surgery Bunions (06/1998) Hashimotos (02/1997) Dexa- OP (12/1996) Thyroid biopsy- pos neoplasm, surgery (09/2006) CXR- COPD, TS partial compression fracture (12/2006) Right thyroid lobectomy, goiter, thyroiditis (12/2006)  Family History: Reviewed history from 11/10/2007 and no changes required. Father: died of stroke at 17, HTN Mother: DM Siblings: brother drowned 30, boat accident brother with DM  Social History: Reviewed history from 06/21/2008 and no changes required. Marital Status: widowed Children: 3 Occupation:  works at the [employer] quit smoking 10/09  Review of Systems       foot pain, swelling, altered gain. No f, chills, sweats. tol by mouth   Physical Exam  General:  GEN: Well-developed,well-nourished,in no acute distress; alert,appropriate and cooperative throughout examination HEENT: Normocephalic and atraumatic without obvious abnormalities. No apparent alopecia or balding. Ears, externally no deformities PULM: Breathing comfortably in no respiratory distress EXT: No clubbing, cyanosis, or edema PSYCH: Normally interactive. Cooperative during the interview. Pleasant. Friendly and conversant. Not anxious or depressed appearing. Normal, full affect.  Msk:  Bilateral feet: Left foot: Mildly tender at bilateral malleoli -- but distal at areas of PT and peroneals, however there is soft tissue swelling diffusely around the medial anterior and lateral ankle, there is tenderness with forefoot compression, R > L, there is tenderness and along the peroneal and along the posterior tibialis tendons. there is some significant loss of range of motion at the great toe. Bunionette formation is present.  Diffuse 2 through 4 metatarsal heads dropped. Patient also significantly pronates while walking.   L foot: pain at 4th MT head  R foot, pain at 2nd MT prox and 3rd MT head, but really throughout  forefoot.  Right foot is essentially as above, however, the right foot is worse with more swelling, worsened MT and peroneal swelling and pain.  the patient does have a prominent bunion in addition to the above findings. Tender with MT compression and with deep palpation at forefoot.   In both feet, there is pain at the insertion of the plantar fascia on the calcaneus with palpation on the plantar aspect and medially, but this is better compared to prior exam. There is no tenderness at the Achilles or in the retrocalcaneal bursa area.  The patient does walk with a limp and pronates significantly bilaterally.   Impression & Recommendations:  Problem # 1:  STRESS FRACTURE OF THE METATARSALS (NWG-956.21) Assessment New Multiple high grade stress reactions on the right and 2 fractures present on MRI, 3rd MT head and 2nd MT proximally which correlates clinically.  Concern with pain and T2 signal at 5th MT  Consult Dr. Lajoyce Corners, [facility] -- appreciate his assistance with this case. Patient placed in CAM walker Aircast boot on the Right. Our office is asking to get pt. in at [facility] in the next couple of days.  Suspicious for L stress fracture as well, obtain plain films.  Work note given  Her updated medication list for this problem includes:    Fosamax 70 Mg Tabs (Alendronate sodium) .Marland Kitchen... 1 by mouth every week as directed  Orders: Orthopedic Surgeon Referral (Ortho Surgeon)  Problem # 2:  FOOT PAIN, LEFT (ICD-729.5)  Orders: Radiology Referral (Radiology)  Problem # 3:  ANKLE PAIN, RIGHT (ICD-719.47)  Problem # 4:  FOOT PAIN, RIGHT (ICD-729.5)  Orders: Orthopedic Surgeon Referral (Ortho Surgeon)  Problem # 5:  TIBIALIS TENDINITIS (ICD-726.72)  Orders: Orthopedic Surgeon Referral (Ortho Surgeon)  Problem # 6:  OTHER ENTHESOPATHY OF ANKLE AND TARSUS (ICD-726.79)  Problem # 7:  PLANTAR FASCIITIS, BILATERAL (ICD-728.71)  Her updated medication list for this  problem includes:    Diclofenac Sodium 75 Mg Tbec (Diclofenac sodium) .Marland Kitchen... 1 each morning with food for foot pain  Orders: Orthopedic Surgeon Referral (Ortho Surgeon)  Complete Medication List: 1)  Nortriptyline Hcl 75 Mg Caps (Nortriptyline hcl) .... Take one by mouth at bedtime with a 25 mg pill also 2)  Calcium 1200 Mg  .... Take one by mouth daily 3)  Levoxyl 75 Mcg Tabs (Levothyroxine sodium) .... One by mouth daily 4)  Zocor 80 Mg Tabs (Simvastatin) .Marland Kitchen.. 1 by mouth once daily 5)  Nortriptyline Hcl 25 Mg Caps (Nortriptyline hcl) .Marland Kitchen.. 1 by mouth at bedtime with her 75 mg capsule also 6)  Fosamax 70 Mg Tabs (Alendronate sodium) .Marland Kitchen.. 1 by mouth every week as directed 7)  Restoril 30 Mg Caps (Temazepam) .Marland Kitchen.. 1 by mouth at bedtime as needed insomnia 8)  Diclofenac Sodium 75 Mg Tbec (Diclofenac sodium) .Marland Kitchen.. 1 each morning with food for foot pain 9)  Nitroglycerin 0.1 Mg/hr Pt24 (Nitroglycerin) .... 1/4 of a patch applied daily as instructed  Patient Instructions: 1)  Referral Appointment Information 2)  Day/Date: 3)  Time: 4)  Place/MD: 5)  Address: 6)  Phone/Fax: 7)  Patient given appointment information. Information/Orders faxed/mailed.   Current Allergies (reviewed today): No known allergies

## 2010-10-08 NOTE — Consult Note (Signed)
Summary: [facility]  [facility]   Imported By: Lanelle Bal 07/22/2009 12:05:19  _____________________________________________________________________  External Attachment:    Type:   Image     Comment:   External Document

## 2010-10-08 NOTE — Assessment & Plan Note (Signed)
Summary: F/U/CLE   Vital Signs:  Patient Profile:   65 Years Old Female Weight:      136 pounds Temp:     98 degrees F oral Pulse rate:   80 / minute Pulse rhythm:   regular BP sitting:   100 / 60  (left arm) Cuff size:   regular  Vitals Entered By: [name] [name] (April 14, 2007 12:20 PM)               Chief Complaint:  Follow up.  History of Present Illness: after last visit- tried hctz for swelling- she stopped it because it made her sick (got really dizzy) called in lasix to try instead-not working at all for the swelling-but no side effects  did ABI tests- which were nl has not had thyroid labs checked since before sx thinks she has been a little short of breath lately, worse in the heat doing well with smoking- cut way down to 1-2 cig daily with intent to quit    Current Allergies: No known allergies      Review of Systems      See HPI  General      Denies chills, fatigue, and fever.  CV      Complains of swelling of feet.      Denies chest pain or discomfort, palpitations, and swelling of hands.  Resp      Denies cough.  MS      feet are really painful to walk on, esp after they have been hanging down feels like they are hot and kind of burning  Derm      Denies changes in color of skin and rash.  Neuro      Denies numbness.  Psych      mood has been ok  Endo      Denies excessive thirst.   Physical Exam  General:     Well-developed,well-nourished,in no acute distress; alert,appropriate and cooperative throughout examination Head:     Normocephalic and atraumatic without obvious abnormalities. No apparent alopecia or balding. Eyes:     vision grossly intact, pupils equal, pupils round, and pupils reactive to light.   Mouth:     pharynx pink and moist.   Neck:     s/p thyroidectomy, still some L thyroid fullness Lungs:     Normal respiratory effort, chest expands symmetrically. Lungs are clear to auscultation, no crackles or  wheezes. Heart:     Normal rate and regular rhythm. S1 and S2 normal without gallop, murmur, click, rub or other extra sounds. Abdomen:     Bowel sounds positive,abdomen soft and non-tender without masses, organomegaly or hernias noted. Msk:     no joint tenderness, no joint swelling, and no joint warmth.   Pulses:     diff to palp pedal pulses Extremities:     trace left pedal edema and trace right pedal edema.   Neurologic:     gait normal and DTRs symmetrical and normal.   no tremor Skin:     turgor normal, color normal, and no rashes.   Cervical Nodes:     No lymphadenopathy noted Psych:     nl affect    Impression & Recommendations:  Problem # 1:  EDEMA LEG (ICD-782.3) ? etiology- causing tightness/pain/burning will check tsh/renal fxn/LFTs today along with BNP, and adj thyroid med as needed if  labs are nl witll consider short hold of statin to see if that is causing pain The following medications were  removed from the medication list:    Hydrochlorothiazide 25 Mg Tabs (Hydrochlorothiazide) .Marland Kitchen... 1 by mouth qd  Orders: Venipuncture (82956) TLB-Renal Function Panel (80069-RENAL) TLB-BNP (B-Natriuretic Peptide) (83880-BNPR)   Complete Medication List: 1)  Nortriptyline Hcl 75 Mg Caps (Nortriptyline hcl) .... Take one by mouth q hs 2)  Calcium 1200 Mg  .... Take one by mouth daily 3)  Levoxyl 50 Mcg Tabs (Levothyroxine sodium) .... Take one by mouth daily 4)  Zocor 80 Mg Tabs (Simvastatin) .Marland Kitchen.. 1 by mouth qd  Other Orders: TLB-ALT (SGPT) (84460-ALT) TLB-AST (SGOT) (84450-SGOT) TLB-TSH (Thyroid Stimulating Hormone) (84443-TSH)   Patient Instructions: 1)  we will check thyroid blood test today and a lab test for heart problems to see if either of those is causing swelling 2)  stop lasix, since it is not helping 3)  if labs are ok- I may call and tell you to hold your simvastatin (cholesterol medication) briefly, to see if that is causing it

## 2010-10-08 NOTE — Progress Notes (Signed)
  Phone Note Call from Patient   Caller: Patient Summary of Call: Called Kimber to ask how she was doing . She had her left Thyroid removed at [facility] and was referred there by the [facility] clinic. She said she was doing  great and did ask the Drs to  please send notes to you.  Initial call taken by: Carlton Adam,  December 19, 2009 9:44 AM  Follow-up for Phone Call        thanks- I appreciate the update  Follow-up by: Judith Part MD,  December 19, 2009 10:13 AM      Past Surgical History:    Hysterectomy- cervical ca cells    Left wrist fracture- surgery    Bunions (06/1998)    Hashimotos (02/1997)    Dexa- OP (12/1996)    Thyroid biopsy- pos neoplasm, surgery (09/2006)    CXR- COPD, TS partial compression fracture (12/2006)    Right thyroid lobectomy, goiter, thyroiditis (12/2006)    [facility]ctomy at [facility] 3/11

## 2010-10-08 NOTE — Progress Notes (Signed)
  Phone Note Call from Patient   Caller: Patient Summary of Call: I called the patient to check on her appointment at [facility]. She had an US done at [facility], they said she has masses on both sides of her neck. She will go back to [facility] on January 7th, 2011 for a Brain Scan. [facility] is handling all the orders for her right now. I asked her to please ask them to send the records to you here. She said she would ask them to send you all records. Initial call taken by: Carlton Adam,  September 04, 2009 9:18 AM  Follow-up for Phone Call        thank you for the update will rev records when they arrive Follow-up by: Judith Part MD,  September 04, 2009 4:34 PM

## 2010-10-08 NOTE — Assessment & Plan Note (Signed)
Summary: ALLERGIES/DLO   Vital Signs:  Patient profile:   65 year old female Height:      63.25 inches Weight:      116.50 pounds BMI:     20.55 Temp:     98.6 degrees F oral Pulse rate:   80 / minute Pulse rhythm:   regular BP sitting:   128 / 76  (left arm) Cuff size:   regular  Vitals Entered By: Lewanda Rife LPN (March 17, 2010 3:31 PM) CC: allergies and sneezing   History of Present Illness: is constantly sneezing and nose is dripping water  cough at night  occ itches - without rash  no wheezing or trouble breathing  is worse when outdoors  never been allergy tested  thinks her allergies started as an adult   has not tried any otc meds  otherwise is having trouble sleeping at night- even with the medicine  feb had thyroid removed in [place]  inc dose to 100 micrograms -- last level checked   lost some weight - more  husb was dx with throat cancer - being treated and had a bleed  is coming home from hospital today  also does not have insurance   worry about him caused her not to eat as much   Allergies (verified): No Known Drug Allergies  Past History:  Past Medical History: Last updated: 07/08/2008 Depression Hypothyroidism Osteoarthritis Osteoporosis tab abuse- past (quit 09) sleep disorder  Past Surgical History: Last updated: 12/18/2009 Hysterectomy- cervical ca cells Left wrist fracture- surgery Bunions (06/1998) Hashimotos (02/1997) Dexa- OP (12/1996) Thyroid biopsy- pos neoplasm, surgery (09/2006) CXR- COPD, TS partial compression fracture (12/2006) Right thyroid lobectomy, goiter, thyroiditis (12/2006) [facility]idectomy at [facility] 3/11  Family History: Last updated: August 25, 2009 Father: died of stroke at 75, HTN Mother: DM, CAD in her 7s  Siblings: brother drowned 3, boat accident brother with DM  Social History: Last updated: 03/17/2010 Marital Status: widowed re married husband has throat cancer Children: 3 Occupation:  works  at the [employer] quit smoking 10/09  Risk Factors: Smoking Status: quit (02/09/2007)  Social History: Marital Status: widowed re married husband has throat cancer Children: 3 Occupation:  works at the [employer] quit smoking 10/09  Review of Systems General:  Complains of fatigue; denies chills, fever, loss of appetite, and malaise. Eyes:  Denies blurring and eye irritation. ENT:  Complains of nasal congestion and postnasal drainage; denies sinus pressure and sore throat. CV:  Denies chest pain or discomfort, lightheadness, and palpitations. Resp:  Complains of cough; denies shortness of breath, sputum productive, and wheezing. GI:  Denies abdominal pain, change in bowel habits, nausea, and vomiting. MS:  Denies joint redness and joint swelling. Derm:  Complains of itching; denies lesion(s), poor wound healing, and rash. Neuro:  Denies headaches and tremors. Psych:  Denies anxiety and depression. Endo:  Denies cold intolerance, excessive thirst, excessive urination, and heat intolerance. Heme:  Denies abnormal bruising and bleeding.  Physical Exam  General:  slim and well appearing  Head:  normocephalic, atraumatic, and no abnormalities observed.  no sinus tenderness  Eyes:  vision grossly intact, pupils equal, pupils round, pupils reactive to light, and no injection.   Ears:  R ear normal and L ear normal.   Nose:  nares are pale and boggy bilat Mouth:  pharynx pink and moist.   Neck:  supple with full rom and no masses or thyromegally, no JVD or carotid bruit  Chest Wall:  No deformities, masses, or tenderness  noted. Lungs:  diffusely distant bs , without rales or rhonchi or crackles fair air exch Heart:  RRR Abdomen:  Bowel sounds positive,abdomen soft and non-tender without masses, organomegaly or hernias noted. Msk:  No deformity or scoliosis noted of thoracic or lumbar spine.   Pulses:  plus one pedal pulses  Extremities:  no CCE Neurologic:  sensation intact to light touch,  gait normal, and DTRs symmetrical and normal.  no tremor  Skin:  Intact without suspicious lesions or rashes Cervical Nodes:  No lymphadenopathy noted Inguinal Nodes:  No significant adenopathy Psych:  normal affect, talkative and pleasant    Impression & Recommendations:  Problem # 1:  ALLERGIC RHINITIS (ICD-477.9) Assessment Deteriorated  with rhinorrhea and sneezing  will try otc zyrtec store brand 10 mg at bedtime  also given samples of veramyst with inst for use once daily update if not imp  given handout on allergies an allergen avoidance from [facility] today  Her updated medication list for this problem includes:    Veramyst 27.5 Mcg/spray Susp (Fluticasone furoate) .Marland Kitchen... 1 spray in each nostril once daily  Problem # 2:  HYPOTHYROIDISM (ICD-244.9) s/p total thyroid removal in feb has not had med adj- in light of sleeplessness- check tsh today and adv  also wt loss-though this may be stress related   The following medications were removed from the medication list:    Levoxyl 75 Mcg Tabs (Levothyroxine sodium) ..... One by mouth daily Her updated medication list for this problem includes:    Levoxyl 100 Mcg Tabs (Levothyroxine sodium) .Marland Kitchen... Take 1 tablet by mouth once a day  Orders: Venipuncture (29518) TLB-Lipid Panel (80061-LIPID) TLB-ALT (SGPT) (84460-ALT) TLB-AST (SGOT) (84450-SGOT) TLB-TSH (Thyroid Stimulating Hormone) (84166-AYT) Prescription Created Electronically [id number])  Problem # 3:  Hx of HYPERCHOLESTEROLEMIA (ICD-272.0) Assessment: Unchanged  on zocor 80 will continue this dose since no muscle pain or side eff and it is affordible lab today and update rev low sat fat diet  Her updated medication list for this problem includes:    Zocor 80 Mg Tabs (Simvastatin) .Marland Kitchen... 1 by mouth once daily  Orders: Venipuncture (09323) TLB-Lipid Panel (80061-LIPID) TLB-ALT (SGPT) (84460-ALT) TLB-AST (SGOT) (84450-SGOT) TLB-TSH (Thyroid Stimulating Hormone)  (55732-KGU) Prescription Created Electronically [id number])  Labs Reviewed: SGOT: 16 (04/18/2009)   SGPT: 8 (04/18/2009)   HDL:59 (04/18/2009), 68 (01/10/2009)  LDL:129 (04/18/2009), 127 (01/10/2009)  Chol:207 (04/18/2009), 211 (01/10/2009)  Trig:95 (04/18/2009), 81 (01/10/2009)  Complete Medication List: 1)  Nortriptyline Hcl 75 Mg Caps (Nortriptyline hcl) .... Take one by mouth at bedtime with a 25 mg pill also 2)  Calcium 1200 Mg  .... Take one by mouth daily 3)  Zocor 80 Mg Tabs (Simvastatin) .Marland Kitchen.. 1 by mouth once daily 4)  Nortriptyline Hcl 25 Mg Caps (Nortriptyline hcl) .Marland Kitchen.. 1 by mouth at bedtime with her 75 mg capsule also 5)  Fosamax 70 Mg Tabs (Alendronate sodium) .Marland Kitchen.. 1 by mouth every week as directed 6)  Restoril 30 Mg Caps (Temazepam) .Marland Kitchen.. 1 by mouth at bedtime as needed insomnia 7)  Diclofenac Sodium 75 Mg Tbec (Diclofenac sodium) .Marland Kitchen.. 1 each morning with food for foot pain 8)  Nitroglycerin 0.1 Mg/hr Pt24 (Nitroglycerin) .... 1/4 of a patch applied daily as instructed 9)  Zestril 10 Mg Tabs (Lisinopril) .... 1/2 by mouth once daily 10)  Levoxyl 100 Mcg Tabs (Levothyroxine sodium) .... Take 1 tablet by mouth once a day 11)  Veramyst 27.5 Mcg/spray Susp (Fluticasone furoate) .Marland Kitchen.. 1 spray in each nostril once daily  Patient Instructions: 1)  get zytec (store brand is fine ) 10 mg and take it each bedtime for allergies - this may also help you sleep 2)  in addition try the veramyst samples 1 spray in each nostril once per day  3)  if not improved in 2 weeks let me know  4)  I sent your px to the pharmacy  Prescriptions: VERAMYST 27.5 MCG/SPRAY SUSP (FLUTICASONE FUROATE) 1 spray in each nostril once daily  #1 mdi x 1   Entered and Authorized by:   Judith Part MD   Signed by:   Judith Part MD on 03/18/2010   Method used:   Samples Given   RxID:   [id number] ZESTRIL 10 MG TABS (LISINOPRIL) 1/2 by mouth once daily  #15 x 11   Entered and Authorized by:   Judith Part  MD   Signed by:   Judith Part MD on 03/17/2010   Method used:   Electronically to        [facility] (retail)       [address]       [place], [place]  [address]       Ph: [phone]       Fax: [phone]   RxID:   [id number] ZOCOR 80 MG TABS (SIMVASTATIN) 1 by mouth once daily  #30 x 11   Entered and Authorized by:   Judith Part MD   Signed by:   Judith Part MD on 03/17/2010   Method used:   Electronically to        [facility] (retail)       [address]       [place], [place]  [address]       Ph: [phone]       Fax: [phone]   RxID:   [id number] NORTRIPTYLINE HCL 25 MG  CAPS (NORTRIPTYLINE HCL) 1 by mouth at bedtime with her 75 mg capsule also  #30 x 11   Entered and Authorized by:   Judith Part MD   Signed by:   Judith Part MD on 03/17/2010   Method used:   Electronically to        [facility] (retail)       [address]       [place], [place]  [id number]       Ph: [phone]       Fax: [phone]   RxID:   [id number] NORTRIPTYLINE HCL 75 MG CAPS (NORTRIPTYLINE HCL) Take one by mouth at bedtime with a 25 mg pill also  #30 x 11   Entered and Authorized by:   Judith Part MD   Signed by:   Judith Part MD on 03/17/2010   Method used:   Electronically to        [facility] (retail)       [address]       [place], [place]  [address]       Ph: [phone]       Fax: [phone]   RxID:   [id number]   Current Allergies (reviewed today): No known allergies

## 2010-10-08 NOTE — Assessment & Plan Note (Signed)
Summary: FEET SWELLING/RBH   Vital Signs:  Patient Profile:   65 Years Old Female Weight:      137 pounds Temp:     98.1 degrees F oral Pulse rate:   80 / minute Pulse rhythm:   regular BP sitting:   132 / 90  (left arm) Cuff size:   regular  Vitals Entered By: Lowella Petties (March 15, 2007 9:51 AM)               Chief Complaint:  Feet are swelling.  History of Present Illness: feet have been swelling worse than before, does elevate them and even use ice packs they hurt when she walks, mostly the top of each foot-pain starts right away when she starts walking when she rests it takes about an hour for pain to resolve hurt when go to bed at hight, then gets better feel red and hot at times had venous doppler last visit on L leg showing no DVT still smokes about 2-3 cig daily, trying her best to quit   Current Allergies: No known allergies      Review of Systems  General      Denies chills, fatigue, and fever.  Resp      Denies cough and shortness of breath.  MS      Denies muscle weakness.  Derm      Denies rash.   Physical Exam  General:     Well-developed,well-nourished,in no acute distress; alert,appropriate and cooperative throughout examination Neck:     No deformities, masses, or tenderness noted. thyroid incison is healing well Lungs:     distant bs Heart:     Normal rate and regular rhythm. S1 and S2 normal without gallop, murmur, click, rub or other extra sounds. Pulses:     unable to palp pedal pulses in L foot R foot diff to palp pulse symmetric femoral pulses Extremities:     1+ left pedal edema and 1+ right pedal edema.   legs are pink and cool Neurologic:     sensation intact to light touch.   Skin:     turgor normal, color normal, and no rashes.   Psych:     nl affect    Impression & Recommendations:  Problem # 1:  EDEMA LEG (ICD-782.3) this is causing discomfort (no sympt of CHF) will try hctz 25 to see if we can  improve the edema Her updated medication list for this problem includes:    Hydrochlorothiazide 25 Mg Tabs (Hydrochlorothiazide) .Marland Kitchen... 1 by mouth qd  Orders: Vascular Other (Vascular other)   Problem # 2:  CLAUDICATION, INTERMITTENT (ICD-443.9) pt is high risk for PVD given smoking and high chol on exam, pulses are unpalpable in L foot, but not R will check ABI tests and advise info on low chol diet was given to pt today (and discussed) Vascular Other (Vascular other)  Orders: Vascular Other (Vascular other)   Problem # 3:  TOBACCO ABUSE (ICD-305.1) pt urged to continue to try to quit smoking  Medications Added to Medication List This Visit: 1)  Hydrochlorothiazide 25 Mg Tabs (Hydrochlorothiazide) .Marland Kitchen.. 1 by mouth qd   Patient Instructions: 1)  we will schedule your ABI test (to evaluate circulation in your legs) when you check out 2)  try the hctz one pill each am for swelling in legs 3)  if you have side effects (like dizziness), stop it and let me know 4)  we will decide when to have you follow up  when your test results are in    Prescriptions: HYDROCHLOROTHIAZIDE 25 MG  TABS (HYDROCHLOROTHIAZIDE) 1 by mouth qd  #30 x 3   Entered and Authorized by:   Judith Part MD   Signed by:   Judith Part MD on 03/15/2007   Method used:   Print then Give to Patient   RxID:   [id number]

## 2010-10-08 NOTE — Miscellaneous (Signed)
Summary: med list update  Clinical Lists Changes  Medications: Changed medication from LEVOXYL 50 MCG  TABS (LEVOTHYROXINE SODIUM) one by mouth daily to LEVOXYL 25 MCG  TABS (LEVOTHYROXINE SODIUM) take one by mouth daily      Prior Medications: NORTRIPTYLINE HCL 75 MG CAPS (NORTRIPTYLINE HCL) Take one by mouth q hs with a 25 mg pill also CALCIUM 1200 MG () take one by mouth daily LEVOXYL 25 MCG  TABS (LEVOTHYROXINE SODIUM) take one by mouth daily ZOCOR 80 MG TABS (SIMVASTATIN) 1 by mouth qd NORTRIPTYLINE HCL 25 MG  CAPS (NORTRIPTYLINE HCL) 1 by mouth at bedtime with her 75 mg capsule also AMBIEN 10 MG  TABS (ZOLPIDEM TARTRATE) 1/2 to 1 by mouth at bedtime as needed insomnia FOSAMAX 70 MG  TABS (ALENDRONATE SODIUM) 1 by mouth q week as directed Current Allergies: No known allergies

## 2010-10-08 NOTE — Miscellaneous (Signed)
Summary: dexa results  Clinical Lists Changes  Observations: Added new observation of BONE DENSITY: abnormal (11/30/2007 17:06)       Preventive Care Screening  Bone Density:    Date:  11/30/2007    Results:  abnormal

## 2010-10-08 NOTE — Assessment & Plan Note (Signed)
Summary: 2-4 week follow up/rbh   Vital Signs:  Patient profile:   65 year old female Weight:      123 pounds Temp:     97.9 degrees F oral Pulse rate:   72 / minute Pulse rhythm:   regular BP sitting:   100 / 60  (left arm) Cuff size:   regular  Vitals Entered By: Lowella Petties CMA (September 09, 2009 11:54 AM) CC: 2-4 week follow up   History of Present Illness: here for f/u of HTN started lisinopril at last visit -(labs ok) and now bp is much improved   had ref [facility]- US showed masses bilat and was ref on for further testing and  goes friday for a biopsy -- ? if it could be related to thyroid  ([facility])    is getting occasional headaches -- despite better blood pressure  thinks the headaches started when she started lisinopril no leg swelling or cp or sob  overall feels ok  no fever or other symptoms  nose ran for a day- and that is better now   Allergies: No Known Drug Allergies  Past History:  Past Medical History: Last updated: 07/08/2008 Depression Hypothyroidism Osteoarthritis Osteoporosis tab abuse- past (quit 09) sleep disorder  Past Surgical History: Last updated: 02/09/2007 Hysterectomy- cervical ca cells Left wrist fracture- surgery Bunions (06/1998) Hashimotos (02/1997) Dexa- OP (12/1996) Thyroid biopsy- pos neoplasm, surgery (09/2006) CXR- COPD, TS partial compression fracture (12/2006) Right thyroid lobectomy, goiter, thyroiditis (12/2006)  Family History: Last updated: 2009-08-29 Father: died of stroke at 52, HTN Mother: DM, CAD in her 24s  Siblings: brother drowned 29, boat accident brother with DM  Social History: Last updated: 06/21/2008 Marital Status: widowed Children: 3 Occupation:  works at the [employer] quit smoking 10/09  Risk Factors: Smoking Status: quit (02/09/2007)  Review of Systems General:  Denies chills, fatigue, fever, loss of appetite, and malaise. Eyes:  Denies blurring and eye  irritation. CV:  Denies chest pain or discomfort, palpitations, shortness of breath with exertion, and swelling of feet. Resp:  Denies chest discomfort, chest pain with inspiration, cough, shortness of breath, and wheezing; still a non smoker . GU:  Denies urinary frequency. MS:  Denies joint redness and joint swelling. Derm:  Denies itching, lesion(s), poor wound healing, and rash. Neuro:  Complains of headaches and sensation of room spinning; denies tingling, tremors, visual disturbances, and weakness. Psych:  mood is ok - some stress with upcoming bx . Endo:  Denies cold intolerance, excessive thirst, excessive urination, and heat intolerance. Heme:  Denies abnormal bruising and bleeding.  Physical Exam  General:  somewhat frail appearing elderly female Head:  normocephalic, atraumatic, no abnormalities observed, and no abnormalities palpated. Eyes:  vision grossly intact, pupils equal, pupils round, and pupils reactive to light.  no conjunctival pallor, injection or icterus  Mouth:  pharynx pink and moist.   Neck:  1.5 cm mobile nontender mass under L side of mandible  L sided goiter noted   Chest Wall:  No deformities, masses, or tenderness noted. Lungs:  diffusely distant bs , without rales or rhonchi or crackles fair air exch Heart:  RRR Msk:  No deformity or scoliosis noted of thoracic or lumbar spine.  some mild kyphosis  Extremities:  no CCE Neurologic:  cranial nerves II-XII intact, sensation intact to light touch, sensation intact to pinprick, gait normal, and DTRs symmetrical and normal.   Skin:  Intact without suspicious lesions or rashes  Cervical Nodes:  No lymphadenopathy noted Psych:  nl affect- pleasant    Impression & Recommendations:  Problem # 1:  ESSENTIAL HYPERTENSION (ICD-401.9) Assessment Improved rev nl labs from last visit  much improved with lisinopril- but pt c/o of some headaches and occ dizziness adv to cut dose in 1/2 -- and update me if those  symptoms do not resolve in 1 week (or if worse) if not imp- will change agent  rev guidelines for healthy diet  Her updated medication list for this problem includes:    Zestril 10 Mg Tabs (Lisinopril) .Marland Kitchen... 1/2 by mouth once daily  BP today: 100/60 Prior BP: 158/84 (08/18/2009)  Labs Reviewed: K+: 4.1 (08/18/2009) Creat: : 0.7 (08/18/2009)   Chol: 207 (04/18/2009)   HDL: 59 (04/18/2009)   LDL: 129 (04/18/2009)   TG: 95 (04/18/2009)  Problem # 2:  SWELLING MASS OR LUMP IN HEAD AND NECK (ICD-784.2) Assessment: Comment Only for upcoming bx of neck masses -- pend [place] report ([facility])  pt is comfortable with this plan  Complete Medication List: 1)  Nortriptyline Hcl 75 Mg Caps (Nortriptyline hcl) .... Take one by mouth at bedtime with a 25 mg pill also 2)  Calcium 1200 Mg  .... Take one by mouth daily 3)  Levoxyl 75 Mcg Tabs (Levothyroxine sodium) .... One by mouth daily 4)  Zocor 80 Mg Tabs (Simvastatin) .Marland Kitchen.. 1 by mouth once daily 5)  Nortriptyline Hcl 25 Mg Caps (Nortriptyline hcl) .Marland Kitchen.. 1 by mouth at bedtime with her 75 mg capsule also 6)  Fosamax 70 Mg Tabs (Alendronate sodium) .Marland Kitchen.. 1 by mouth every week as directed 7)  Restoril 30 Mg Caps (Temazepam) .Marland Kitchen.. 1 by mouth at bedtime as needed insomnia 8)  Diclofenac Sodium 75 Mg Tbec (Diclofenac sodium) .Marland Kitchen.. 1 each morning with food for foot pain 9)  Nitroglycerin 0.1 Mg/hr Pt24 (Nitroglycerin) .... 1/4 of a patch applied daily as instructed 10)  Zestril 10 Mg Tabs (Lisinopril) .... 1/2 by mouth once daily  Patient Instructions: 1)  cut your zestril (lisinopril) in 1/2 and just take 1/2 pill daily  2)  if your headache and dizziness do not resolve in 1 week- call and leave me a message (will change medicines if that occurs)  3)  follow up for your biopsies as planned  4)  follow up with me in about 3 months  5)  try to keep up regular meals and a healthy diet

## 2010-10-09 ENCOUNTER — Encounter: Payer: Self-pay | Admitting: Family Medicine

## 2010-10-09 ENCOUNTER — Ambulatory Visit (INDEPENDENT_AMBULATORY_CARE_PROVIDER_SITE_OTHER): Payer: Self-pay | Admitting: Family Medicine

## 2010-10-09 DIAGNOSIS — B029 Zoster without complications: Secondary | ICD-10-CM

## 2010-10-22 NOTE — Assessment & Plan Note (Signed)
Summary: rash on arm/alc   Vital Signs:  Patient profile:   65 year old female Height:      63.25 inches Weight:      125 pounds BMI:     22.05 Temp:     98.2 degrees F oral Pulse rate:   80 / minute Pulse rhythm:   regular BP sitting:   126 / 78  (left arm) Cuff size:   regular  Vitals Entered By: Lewanda Rife LPN (October 09, 2010 10:05 AM) CC: rash on lt arm for 2 weeks, Itches and burns and hurts if clothes touch it    History of Present Illness: rash on L arm started 2 weeks ago  got to itching severely and then turned red and started to sting and deeply hurt is healing now but still hurts a lot and keeps her up at night  hurts for clothes to touch it  blisters have healed no where else on body  will have medicare soon  is applying for medicaid    Allergies (verified): No Known Drug Allergies  Past History:  Family History: Last updated: September 03, 2009 Father: died of stroke at 45, HTN Mother: DM, CAD in her 43s  Siblings: brother drowned 8, boat accident brother with DM  Social History: Last updated: 03/17/2010 Marital Status: widowed re married husband has throat cancer Children: 3 Occupation:  works at the [employer] quit smoking 10/09  Risk Factors: Smoking Status: quit (02/09/2007)  Past Medical History: Depression Hypothyroidism Osteoarthritis Osteoporosis tab abuse- past (quit 09) sleep disorder shingles (arm) 1/12  Past Surgical History: Hysterectomy- cervical ca cells Left wrist fracture- surgery Bunions (06/1998) Hashimotos (02/1997) Dexa- OP (12/1996) Thyroid biopsy- pos neoplasm, surgery (09/2006) CXR- COPD, TS partial compression fracture (12/2006) Right thyroid lobectomy, goiter, thyroiditis (12/2006) [facility] 3/11 stress fractures foot  Review of Systems General:  Denies chills, fatigue, fever, loss of appetite, and malaise. Eyes:  Denies blurring and eye irritation. CV:  Denies chest pain or discomfort and  palpitations. Resp:  Denies cough and shortness of breath. GI:  Denies nausea and vomiting. MS:  Denies joint redness and joint swelling. Derm:  Complains of lesion(s) and rash. Neuro:  Denies headaches. Heme:  Denies abnormal bruising and bleeding.  Physical Exam  General:  slim and well appearing  Mouth:  pharynx pink and moist.   Neck:  supple with full rom and no masses or thyromegally, no JVD or carotid bruit  Lungs:  diffusely distant bs , without rales or rhonchi or crackles fair air exch Heart:  RRR Skin:  L arm - herpetic healing rash over ant L arm with widely spaced healing vesicles and scant erythema no rash on back or neck Psych:  normal affect, talkative and pleasant    Impression & Recommendations:  Problem # 1:  HERPES ZOSTER (ICD-053.9) Assessment New  over 2 weeks with resolving herpetic rash on L arm with residual itch and pain recommend antihistamine otc for itch gabapentin 300 at bedtime as needed -- and can take one in am if necessary(disc side eff of dizziness or sleepiness) given handout on shingles from [facility] to read pt advised to update me if symptoms worsen or do not improve   Orders: Prescription Created Electronically [phone])  Complete Medication List: 1)  Nortriptyline Hcl 75 Mg Caps (Nortriptyline hcl) .... Take one by mouth at bedtime with a 25 mg pill also 2)  Calcium 1200 Mg  .... Take one by mouth daily 3)  Zocor 80 Mg  Tabs (Simvastatin) .Marland Kitchen.. 1 by mouth once daily 4)  Nortriptyline Hcl 25 Mg Caps (Nortriptyline hcl) .Marland Kitchen.. 1 by mouth at bedtime with her 75 mg capsule also 5)  Fosamax 70 Mg Tabs (Alendronate sodium) .Marland Kitchen.. 1 by mouth every week as directed 6)  Restoril 30 Mg Caps (Temazepam) .Marland Kitchen.. 1 by mouth at bedtime as needed insomnia 7)  Diclofenac Sodium 75 Mg Tbec (Diclofenac sodium) .Marland Kitchen.. 1 each morning with food for foot pain 8)  Nitroglycerin 0.1 Mg/hr Pt24 (Nitroglycerin) .... 1/4 of a patch applied daily as instructed 9)  Zestril 10 Mg  Tabs (Lisinopril) .... 1/2 by mouth once daily 10)  Levoxyl 75 Mcg Tabs (Levothyroxine sodium) .Marland Kitchen.. 1 by mouth once daily 11)  Veramyst 27.5 Mcg/spray Susp (Fluticasone furoate) .Marland Kitchen.. 1 spray in each nostril once daily 12)  Neurontin 300 Mg Caps (Gabapentin) .Marland Kitchen.. 1 by mouth at bedtime  can take 1 in am if needed for painful rash this will cause sleepiness  Patient Instructions: 1)  take gabapentin at night (can also take in am if needed for painful rash )  2)  you can also use zyrtec over the counter 1 pill at night for itching -- this is just an antihistamine / allergy med  3)  update me if worse or not improving  Prescriptions: NEURONTIN 300 MG CAPS (GABAPENTIN) 1 by mouth at bedtime  can take 1 in am if needed for painful rash this will cause sleepiness  #60 x 1   Entered and Authorized by:   Judith Part MD   Signed by:   Judith Part MD on 10/09/2010   Method used:   Electronically to        [facility] (retail)       [address]       [place], [place]  [id number]       Ph: [phone]       Fax: [phone]   RxID:   [id number]    Orders Added: 1)  Est. Patient Level II [44034] 2)  Prescription Created Electronically [phone]    Current Allergies (reviewed today): No known allergies

## 2010-11-17 ENCOUNTER — Ambulatory Visit (INDEPENDENT_AMBULATORY_CARE_PROVIDER_SITE_OTHER): Payer: Self-pay | Admitting: Family Medicine

## 2010-11-17 ENCOUNTER — Encounter: Payer: Self-pay | Admitting: Family Medicine

## 2010-11-17 ENCOUNTER — Telehealth: Payer: Self-pay | Admitting: Family Medicine

## 2010-11-17 DIAGNOSIS — E7800 Pure hypercholesterolemia, unspecified: Secondary | ICD-10-CM

## 2010-11-17 DIAGNOSIS — I10 Essential (primary) hypertension: Secondary | ICD-10-CM

## 2010-11-17 DIAGNOSIS — B009 Herpesviral infection, unspecified: Secondary | ICD-10-CM | POA: Insufficient documentation

## 2010-11-17 DIAGNOSIS — E039 Hypothyroidism, unspecified: Secondary | ICD-10-CM

## 2010-11-24 NOTE — Progress Notes (Signed)
Summary: refill request for temazepam  Phone Note Refill Request Message from:  Fax from Pharmacy  Refills Requested: Medication #1:  temazepam 30 mg one by mouth at bedtime as needed for nausea   Last Refilled: 10/14/2010 Faxed request from [facility], this is not on med list.  Phone [phone].  Initial call taken by: Lowella Petties CMA, AAMA,  November 17, 2010 4:34 PM  Follow-up for Phone Call        this is restoril- is on med list  ok for 30 with 3 refils   Follow-up by: Judith Part MD,  November 17, 2010 5:56 PM  Additional Follow-up for Phone Call Additional follow up Details #1::        Medication phoned to  [facility] pharmacy as instructed. Lewanda Rife LPN  November 17, 2010 6:01 PM     Prescriptions: RESTORIL 30 MG CAPS (TEMAZEPAM) 1 by mouth at bedtime as needed insomnia  #30 x 3   Entered by:   Lewanda Rife LPN   Authorized by:   Judith Part MD   Signed by:   Lewanda Rife LPN on 43/32/9518   Method used:   Telephoned to ...       [facility] (retail)       [address]       [place], [place]  [id number]       Ph: [phone]       Fax: [phone]   RxID:   [id number]

## 2010-12-03 NOTE — Assessment & Plan Note (Signed)
Summary: six month follo wup/RI R/S 11/23/10   Vital Signs:  Patient profile:   65 year old female Height:      63.25 inches Weight:      125.75 pounds BMI:     22.18 Temp:     98.2 degrees F oral Pulse rate:   80 / minute Pulse rhythm:   regular BP sitting:   128 / 76  (left arm) Cuff size:   regular  Vitals Entered By: Lewanda Rife LPN (November 17, 2010 2:11 PM) CC: six month f/u   History of Present Illness: here for f/u of hypothryoidism (s/p thyroidectomy) and HTN and lipids  has been feeling fair in general  has a sore on inside of upper lip - hurts for 2 weeks - is getting smaller   last thyroid labs nl  no change in dose  wt is stable - that is reassuring   HTN in good control 128/76 had cut her zestril in 1/2 - and still ok (was having side eff) side effects are gone   lipids ok in summer with LDL 126 - with statin and good thyroid suppl is on zocor 80 however and that is no longer rec at that high of a dose    Allergies (verified): No Known Drug Allergies  Past History:  Past Medical History: Last updated: 10/09/2010 Depression Hypothyroidism Osteoarthritis Osteoporosis tab abuse- past (quit 09) sleep disorder shingles (arm) 1/12  Past Surgical History: Last updated: 10/09/2010 Hysterectomy- cervical ca cells Left wrist fracture- surgery Bunions (06/1998) Hashimotos (02/1997) Dexa- OP (12/1996) Thyroid biopsy- pos neoplasm, surgery (09/2006) CXR- COPD, TS partial compression fracture (12/2006) Right thyroid lobectomy, goiter, thyroiditis (12/2006) [facility] at [facility] 3/11 stress fractures foot  Family History: Last updated: 09/04/2009 Father: died of stroke at 24, HTN Mother: DM, CAD in her 54s  Siblings: brother drowned 23, boat accident brother with DM  Social History: Last updated: 03/17/2010 Marital Status: widowed re married husband has throat cancer Children: 3 Occupation:  works at the [employer] quit smoking 10/09  Risk  Factors: Smoking Status: quit (02/09/2007)  Review of Systems General:  Denies fatigue, loss of appetite, and malaise. Eyes:  Denies blurring and eye irritation. CV:  Denies chest pain or discomfort, palpitations, and shortness of breath with exertion. Resp:  Denies cough, shortness of breath, and wheezing. GI:  Denies abdominal pain, change in bowel habits, indigestion, and nausea. GU:  Denies dysuria and urinary frequency. MS:  Denies cramps, muscle weakness, and stiffness. Derm:  Denies itching, lesion(s), poor wound healing, and rash. Neuro:  Denies numbness and tingling. Psych:  Denies anxiety and depression. Endo:  Denies cold intolerance, excessive thirst, excessive urination, and heat intolerance. Heme:  Denies abnormal bruising and bleeding.  Physical Exam  General:  slim and well appearing  Head:  normocephalic, atraumatic, and no abnormalities observed Eyes:  vision grossly intact, pupils equal, pupils round, pupils reactive to light, and no injection.   Mouth:  pharynx pink and moist.   lesion on lower lip is healing Neck:  supple with full rom and no masses or thyromegally, no JVD or carotid bruit  thyroidect scars apparent  Lungs:  diffusely distant bs , without rales or rhonchi or crackles fair air exch Heart:  RRR Msk:  No deformity or scoliosis noted of thoracic or lumbar spine.   Pulses:  plus one pedal pulses  Extremities:  no CCE Neurologic:  sensation intact to light touch, gait normal, and DTRs symmetrical and normal.  no tremor  Skin:  2-3 mm scab inner mid upper lip  resembles healed cold sore Cervical Nodes:  No lymphadenopathy noted Psych:  normal affect, talkative and pleasant    Impression & Recommendations:  Problem # 1:  FEVER BLISTER (ICD-054.9) Assessment New lesion on inner upper lip looks to be healing cold sore with scab recommend abbreva otc and update if not resolved in 2 wk --would rec derm consult in that instance  Problem # 2:   ESSENTIAL HYPERTENSION (ICD-401.9) Assessment: Unchanged  this is fine on 1/2 ace without side eff will continue to follow f/u 6 mo with labs Her updated medication list for this problem includes:    Zestril 10 Mg Tabs (Lisinopril) .Marland Kitchen... 1/2 by mouth once daily  BP today: 128/76 Prior BP: 126/78 (10/09/2010)  Labs Reviewed: K+: 4.1 (08/18/2009) Creat: : 0.7 (08/18/2009)   Chol: 209 (03/17/2010)   HDL: 75.40 (03/17/2010)   LDL: 129 (04/18/2009)   TG: 62.0 (03/17/2010)  Problem # 3:  HYPOTHYROIDISM (ICD-244.9) Assessment: Unchanged  s/p thyroidectomy no clinical changes rev last labs continue current dose  f/u 6 mo with visit and labs Her updated medication list for this problem includes:    Levoxyl 75 Mcg Tabs (Levothyroxine sodium) .Marland Kitchen... 1 by mouth once daily  Labs Reviewed: TSH: 0.72 (05/19/2010)    Chol: 209 (03/17/2010)   HDL: 75.40 (03/17/2010)   LDL: 129 (04/18/2009)   TG: 62.0 (03/17/2010)  Problem # 4:  Hx of HYPERCHOLESTEROLEMIA (ICD-272.0) Assessment: Unchanged new rec for zocor warn against 80 mg dose with risks of rhabdo will cut to 1/2 and research opt for affordible alt - pt will let me know  continue low sat fat diet  Her updated medication list for this problem includes:    Zocor 80 Mg Tabs (Simvastatin) .Marland Kitchen... 1/2 by mouth once daily  Complete Medication List: 1)  Nortriptyline Hcl 75 Mg Caps (Nortriptyline hcl) .... Take one by mouth at bedtime with a 25 mg pill also 2)  Calcium 1200 Mg  .... Take one by mouth daily 3)  Zocor 80 Mg Tabs (Simvastatin) .... 1/2 by mouth once daily 4)  Nortriptyline Hcl 25 Mg Caps (Nortriptyline hcl) .Marland Kitchen.. 1 by mouth at bedtime with her 75 mg capsule also 5)  Fosamax 70 Mg Tabs (Alendronate sodium) .Marland Kitchen.. 1 by mouth every week as directed 6)  Restoril 30 Mg Caps (Temazepam) .Marland Kitchen.. 1 by mouth at bedtime as needed insomnia 7)  Zestril 10 Mg Tabs (Lisinopril) .... 1/2 by mouth once daily 8)  Levoxyl 75 Mcg Tabs (Levothyroxine  sodium) .Marland Kitchen.. 1 by mouth once daily 9)  Veramyst 27.5 Mcg/spray Susp (Fluticasone furoate) .Marland Kitchen.. 1 spray in each nostril once daily 10)  Neurontin 300 Mg Caps (Gabapentin) .Marland Kitchen.. 1 by mouth at bedtime  can take 1 in am if needed for painful rash this will cause sleepiness  Patient Instructions: 1)  cut your zocor in 1/2 and take 1/2 pill once daily until we get you a new alternative 2)  ask [facility] and perhaps [facility] what statin medicines would be affordible for you and let me know (that is cholesterol medicine) 3)  I think the sore on your lip is a healing cold sore- use abbreva over the counter as directed and if not healed in 2 weeks let me know  4)  blood pressure is good 5)  go to pharmacy and tell them you need a refil of your restoril (temazepam) --if any problems let me know  6)  follow up  in about 6 months - we will do labs that day also    Orders Added: 1)  Est. Patient Level II [04540]    Current Allergies (reviewed today): No known allergies

## 2010-12-16 ENCOUNTER — Telehealth: Payer: Self-pay | Admitting: *Deleted

## 2010-12-16 MED ORDER — PRAVASTATIN SODIUM 20 MG PO TABS: 20.0000 mg | ORAL_TABLET | Freq: Every evening | ORAL | Status: DC

## 2010-12-16 NOTE — Telephone Encounter (Signed)
Pharmacy says that the patient is complaining with muscle aches/cramps that she believes is coming from her Simvastatin.  Pharmacy is asking if we have an alternate Rx that she could try?

## 2010-12-16 NOTE — Telephone Encounter (Signed)
Pharmacy is asking to change pt from simvastatin to something else.  She says the simvastatin is causing her to have muscle pain and cramps.  Advised pharmacy that you are out until Monday.

## 2010-12-16 NOTE — Telephone Encounter (Signed)
Lets try pravastatin Px written for call in   She has no insurance so let me know if she cannot afford it  If any side effects like aches and pains stop it and update me  Check fasting lab for lipid profile/ tsh 244.9 and 272 in 6-8 weeks please Eat a low sat fat diet

## 2010-12-17 NOTE — Telephone Encounter (Signed)
Medication phoned to [facility] pharmacy as instructed. Patient notified as instructed by telephone. Pt was not at home and will call back for fasting lab appt for lipid profile,tsh:[phone],272.

## 2011-01-22 NOTE — Consult Note (Signed)
[facility]                          ENDOCRINOLOGY CONSULTATION   NAME:[name] [name]                         MRN:          [id number]  DATE:09/22/2006                            DOB:          Jul 15, 1946    REFERRING PHYSICIAN:  Marne A. Tower, MD   REASON FOR REFERRAL:  Thyroid enlargement.   HISTORY OF PRESENT ILLNESS:  A 65 year old woman who states she has been  on a thyroid medication for many years.  She says she takes a white  tablet for this, which I take to be levothyroxine 50 mcg a day.  She  states that for the past six months, the right side of her thyroid has  been steadily getting larger.   She also has chronic dysphagia with solids equaling liquids.   She also states chronic weight gain of 20 pounds in the past year.   PAST MEDICAL HISTORY:  Dyslipidemia, for which she takes Lipitor.   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   FAMILY HISTORY:  Negative for thyroid disease.   REVIEW OF SYSTEMS:  She denies neck pain.  She denies shortness of  breath.   PHYSICAL EXAMINATION:  VITAL SIGNS:  Blood pressure is 145/85.  Heart  rate 104.  Temperature is 99.1.  The weight is 135.  GENERAL:  No distress.  SKIN:  Not diaphoretic.  No rash.  HEENT:  No proptosis.  No periorbital swelling.  Pharynx normal.  NECK:  There is a 4 cm diameter right-sided thyroid mass.  CHEST:  Clear to auscultation.  No respiratory distress.  CARDIOVASCULAR:  No JVD.  No edema.  Regular rate and rhythm.  No  murmur.  Pedal pulses are intact.  NEUROLOGIC:  Alert and oriented.  Does not appear anxious or depressed.  NODES:  There is no lymphadenopathy at the neck or at the  supraclavicular area.   PROCEDURE:  Thyroid biopsy.   INDICATIONS:  Enlarging thyroid mass.   CONSENT:  Risks, benefits and alternatives are explained to the patient,  and she gives her consent.   Local is 2% Xylocaine with epinephrine.   PREP:  Betadine.   PROCEDURE IN DETAIL:  Seven biopsies are done with 27 gauge needles,  except one biopsy is done with a 25 gauge needle.  No complications.   The cytology report is available at the time of this dictation.  She has  features of both a follicular neoplasm as well as Hashimoto's  thyroiditis.   IMPRESSION:  1. Enlarging thyroid mass with a differential diagnosis of lymphoma      versus follicular adenocarcinoma of the thyroid versus a follicular      adenoma.  2. Hypothyroidism.  She is euthyroid, on chronic replacement.  3. Dysphagia.  Very unlikely to be thyroid related.  4. Weight gain, not thyroid related.   PLAN:  1. Same Synthroid.  2. Refer to surgery to consider a diagnostic right thyroid lobectomy.  3. Return here a week or two after your surgery.     Sean A. Everardo All, MD  Electronically Signed    SAE/MedQ  DD: 09/23/2006  DT: 09/23/2006  Job #: [id number]   cc:   Marne A. Milinda Antis, MD

## 2011-01-22 NOTE — Op Note (Signed)
NAME:  [name] [name], [name] [name]                ACCOUNT NO.:  [id number]   MEDICAL RECORD NO.:  [id number]          PATIENT TYPE:  OIB   LOCATION:  2550                         FACILITY:  [facility]   PHYSICIAN:  Currie Paris, M.D.DATE OF BIRTH:  03-21-1946   DATE OF PROCEDURE:  12/14/2006  DATE OF DISCHARGE:                               OPERATIVE REPORT   OFFICE MEDICAL RECORD NUMBER [id number].   PREOPERATIVE DIAGNOSES:  Hashimoto's thyroiditis with a large right  lobe.   POSTOPERATIVE DIAGNOSES:  Hashimoto's thyroiditis with a large right  lobe.   OPERATION:  Right thyroid lobectomy with isthmusectomy and removal of  pyramidal lobe.   SURGEON:  Dr. Jamey Ripa.   ASSISTANT:  Dr. Leonie Man.   ANESTHESIA:  General endotracheal.   CLINICAL HISTORY:  [name] [name] is a 65 year old lady with a very large  visible right thyroid nodule that measured over 6 cm and was quite firm.  She had some enlargement of the left lobe, but much smaller.  Biopsy had  been consistent with some thyroiditis, probably Hashimoto's.  Because  she was having some compression symptoms we elected to do a partial  thyroidectomy with possible total thyroidectomy.   DESCRIPTION OF PROCEDURE:  The patient was seen in the holding area and  she had no further questions.  We confirmed that the right thyroid lobe  was the planned surgery and the right side of the neck was initialed by  me.   The patient was taken to the operating room and after satisfactory  general endotracheal anesthesia obtained the neck was prepped and  draped.  The time-out occurred.   A curvilinear cervical transverse incision was made and the subcutaneous  tissue and platysma divided.  Subplatysmal flaps were raised in the  usual fashion.   Self-retaining retractor was placed and the midline fascia opened to  expose the thyroid.  The strap muscles were fairly stuck to the thyroid  gland which was quite firm and succulent.  It was mobilized a  little bit  medially by taking some inferior veins coming into the midline of the  thyroid.  I then dissected out a little bit of the superior pole, but  this was also fairly large and difficult to expose.  I was then able to  rotate the thyroid medially and once that was done I was able to then  dissect out individually and individually ligate the vessels coming into  the superior pole using a combination of clips, ties and Harmonic  scalpel.   With that done I was able to continue to divide the blood vessels coming  in the thyroid staying in the surgical plane and dropping the tissues  laterally.  I identified the nerve and this was kept posterior.  I saw  what appeared adjacent to the nerve to be two parathyroids, but these  were not further dissected out.  There is nothing on the thyroid gland  that we  removed that appeared to be parathyroid.  Once I got well  medial of the nerve I was able to use a combination of the cautery  and  harmonic to remove the thyroid from the trachea and when I got across  the midline I was able to divide the isthmus with the Harmonic.  This  was sent for frozen section.   Further evaluation showed that the left lobe appeared to be consistent  to palpation with the right, although considerably smaller.  There is no  dominant mass in the left lobe.  We had considered removing the left  lobe, but since it was so much smaller than the right, I elected to  leave it. There is a prominent pyramidal lobe and at this point I went  ahead and excised that using the Harmonic so that we did not have any  thyroid tissue directly in the anterior midline.   I irrigated and made sure everything was dry.  We checked the integrity  of the nerve again and it appeared without injury.  There were several  little oozing places in the muscle that  had to be suture ligated as  everything was just a little bit more oozing than normal.   Once everything was dry the strap  muscles reapproximated with 4-0  Vicryl, the platysma, 4-0 Vicryl, and skin staples.   Dr. Charlott Rakes for pathology reported that this appeared to be benign  and more consistent with Hashimoto's.   The patient tolerated the procedure well.  There were no operative  complications.  All counts were correct.      Currie Paris, M.D.  Electronically Signed     CJS/MEDQ  D:  12/14/2006  T:  12/14/2006  Job:  [id number]   cc:   Marne A. Milinda Antis, MD  Cleophas Dunker. Everardo All, MD

## 2011-02-26 ENCOUNTER — Encounter: Payer: Self-pay | Admitting: Cardiovascular Disease

## 2011-03-11 ENCOUNTER — Emergency Department: Payer: Self-pay | Admitting: Emergency Medicine

## 2011-03-16 ENCOUNTER — Encounter: Payer: Self-pay | Admitting: Family Medicine

## 2011-03-16 ENCOUNTER — Other Ambulatory Visit: Payer: Self-pay | Admitting: *Deleted

## 2011-03-16 MED ORDER — NORTRIPTYLINE HCL 75 MG PO CAPS: 75.0000 mg | ORAL_CAPSULE | Freq: Every day | ORAL | Status: DC

## 2011-03-16 MED ORDER — TEMAZEPAM 30 MG PO CAPS: 30.0000 mg | ORAL_CAPSULE | Freq: Every day | ORAL | Status: DC

## 2011-03-16 NOTE — Telephone Encounter (Signed)
Received faxed refill request from pharmacy. Last refill on both medications was 02/12/11 #30.

## 2011-03-16 NOTE — Telephone Encounter (Signed)
Px written for call in   

## 2011-03-17 ENCOUNTER — Ambulatory Visit (INDEPENDENT_AMBULATORY_CARE_PROVIDER_SITE_OTHER): Payer: Self-pay | Admitting: Family Medicine

## 2011-03-17 ENCOUNTER — Ambulatory Visit (INDEPENDENT_AMBULATORY_CARE_PROVIDER_SITE_OTHER)
Admission: RE | Admit: 2011-03-17 | Discharge: 2011-03-17 | Disposition: A | Discharge status: Home / Self Care | Payer: Self-pay | Source: Ambulatory Visit | Attending: Family Medicine | Admitting: Family Medicine

## 2011-03-17 ENCOUNTER — Encounter: Payer: Self-pay | Admitting: Family Medicine

## 2011-03-17 DIAGNOSIS — M79603 Pain in arm, unspecified: Secondary | ICD-10-CM

## 2011-03-17 DIAGNOSIS — M25539 Pain in unspecified wrist: Secondary | ICD-10-CM

## 2011-03-17 DIAGNOSIS — I10 Essential (primary) hypertension: Secondary | ICD-10-CM

## 2011-03-17 DIAGNOSIS — M79609 Pain in unspecified limb: Secondary | ICD-10-CM

## 2011-03-17 NOTE — Assessment & Plan Note (Addendum)
Lateral wrist and arm pain after fall on outstretched hand on 7/5 xr that day nl - but repeated today due to pain - initial eval fx not seen but pend rad overread Re applied wrist splint for comfort with ace Recommend ice Recommend pain med (percocet) if she really needs it  Will continue to elevate Will make plan after x ray result

## 2011-03-17 NOTE — Patient Instructions (Signed)
Blood pressure is back to normal today  I will get x ray reading tomorrow- then will make a plan  Continue wearing splint  try ice for 10 minutes at a time Use pain pill if needed - even split in 1/2

## 2011-03-17 NOTE — Telephone Encounter (Signed)
Medication phoned to [facility] pharmacy as instructed.

## 2011-03-17 NOTE — Progress Notes (Signed)
Subjective:    Patient ID: [name] [name], female    DOB: 1946-03-05, 65 y.o.   MRN: [id number]  HPI Here for f/u from ER visit at [facility] on 7/5 Larey Seat and hurt her wrist - neg x ray/ splint / percocet Also incidentally found bp to be 190/73-- and had to give her catapress (which brought it down)  Was cleaning the house and tripped and fell  Wrist overextended when she caught herself with her L arm  Hurt severely  Went to ER They did x ray and did not find any fractures  Have her in a splint   Now is still quite painful - pain shoots through it   Also chest wall pain- and chest/ rib x rays were ok  Also low back pain   bp very high - likely due to pain  That came down with catapress and fine ever since  Not resting well at night due to pain   Patient Active Problem List  Diagnoses  . GOITER, MULTINODULAR  . HYPOTHYROIDISM  . THYROIDITIS  . HYPERCHOLESTEROLEMIA  . DEPRESSION  . ESSENTIAL HYPERTENSION  . CLAUDICATION, INTERMITTENT  . ALLERGIC RHINITIS  . OSTEOARTHRITIS  . BUNION  . OSTEOPOROSIS  . SLEEP DISORDER  . EDEMA LEG  . TOBACCO USE, QUIT  . FEVER BLISTER  . Wrist pain  . Arm pain   Past Medical History  Diagnosis Date  . Depression   . Hypothyroidism   . Osteoarthritis   . Osteoporosis   . Tobacco abuse     past; quit 09  . Sleep disorder   . Shingles     arm 1/12   Past Surgical History  Procedure Date  . Hyerectomy- cervical ca cells   . Left wrist fracture     surgery  . Bunions 06/1998  . Hashimoto 02/1997  . Dexa 12/1996  . Biopsy thyroid     pos neoplasm, surgery 09/2006  . Cxr-copd, ts partial compression fracture 12/2006  . Right thyroid lobectomy, goiter, thyroiditis 12/2006  . Thyroidectomy 11/2009  . Stress fractures foot    History  Substance Use Topics  . Smoking status: Former Games developer  . Smokeless tobacco: Not on file   Comment: Quit 10/09  . Alcohol Use: Not on file   Family History  Problem Relation Age of Onset  . Diabetes  Mother   . Coronary artery disease Mother   . Stroke Father   . Hypertension Father    No Known Allergies Current Outpatient Prescriptions on File Prior to Visit  Medication Sig Dispense Refill  . alendronate (FOSAMAX) 70 MG tablet Take 70 mg by mouth every 7 (seven) days. Take with a full glass of water on an empty stomach.       . Calcium Carbonate-Vit D-Min (CALCIUM 1200 PO) Take by mouth daily.        . fluticasone (VERAMYST) 27.5 MCG/SPRAY nasal spray Place 2 sprays into the nose daily.        Marland Kitchen gabapentin (NEURONTIN) 300 MG capsule Take 300 mg by mouth. 1 by mouth at bedtiem can take 1 in am if needed for painful rash this will cause sleepiness       . levothyroxine (LEVOXYL) 75 MCG tablet Take 75 mcg by mouth daily.        Marland Kitchen lisinopril (ZESTRIL) 10 MG tablet Take 5 mg by mouth daily.       . nortriptyline (PAMELOR) 25 MG capsule Take 25 mg by mouth at bedtime. w  75 mg=with her 75 mg capsule also       . nortriptyline (PAMELOR) 75 MG capsule Take 1 capsule (75 mg total) by mouth at bedtime.  30 capsule  11  . pravastatin (PRAVACHOL) 20 MG tablet Take 1 tablet (20 mg total) by mouth every evening.  30 tablet  11  . simvastatin (ZOCOR) 80 MG tablet Take 40 mg by mouth daily.       . temazepam (RESTORIL) 30 MG capsule Take 1 capsule (30 mg total) by mouth at bedtime.  30 capsule  5       Review of Systems Review of Systems  Constitutional: Negative for fever, appetite change, fatigue and unexpected weight change.  Eyes: Negative for pain and visual disturbance.  Respiratory: Negative for cough and shortness of breath.   Cardiovascular: Negative for cp or sob.   Gastrointestinal: Negative for nausea, diarrhea and constipation.  Genitourinary: Negative for urgency and frequency.  Skin: Negative for pallor. or rash MSK pos for arm and wrist pain  Neurological: Negative for weakness, light-headedness, numbness and headaches.  Hematological: Negative for adenopathy. Does not  bruise/bleed easily.  Psychiatric/Behavioral: Negative for dysphoric mood. The patient is not nervous/anxious.          Objective:   Physical Exam  Constitutional: She appears well-developed and well-nourished.  HENT:  Head: Normocephalic and atraumatic.  Eyes: Conjunctivae and EOM are normal. Pupils are equal, round, and reactive to light.  Neck: Normal range of motion. Neck supple.  Cardiovascular: Normal rate, regular rhythm and normal heart sounds.   Pulmonary/Chest: Effort normal and breath sounds normal. No respiratory distress. She has no wheezes.  Musculoskeletal:       L wrist is tender laterally and dorsally  No snuffbox tenderness Pain to flex wrist more than 20 deg , but ext is full as is med/lat flex Pos pain on twist  No swelling or crepitice or deformity noted  Grip is normal but causes pain   Lymphadenopathy:    She has no cervical adenopathy.  Neurological: She is alert. She has normal strength and normal reflexes. No sensory deficit.  Skin: Skin is warm and dry. No rash noted. No erythema. No pallor.  Psychiatric: She has a normal mood and affect.          Assessment & Plan:

## 2011-03-17 NOTE — Assessment & Plan Note (Signed)
bp went quite high immed after injury (190/70s)- but this is much imp now  Had one dose of catapress in ER only  Re assured today and will continue to monitor

## 2011-03-19 ENCOUNTER — Encounter: Payer: Self-pay | Admitting: Family Medicine

## 2011-03-24 ENCOUNTER — Encounter: Payer: Self-pay | Admitting: Family Medicine

## 2011-03-24 ENCOUNTER — Ambulatory Visit (INDEPENDENT_AMBULATORY_CARE_PROVIDER_SITE_OTHER): Payer: Medicare Other | Admitting: Family Medicine

## 2011-03-24 ENCOUNTER — Ambulatory Visit (INDEPENDENT_AMBULATORY_CARE_PROVIDER_SITE_OTHER)
Admission: RE | Admit: 2011-03-24 | Discharge: 2011-03-24 | Disposition: A | Discharge status: Home / Self Care | Payer: Self-pay | Source: Ambulatory Visit | Attending: Family Medicine | Admitting: Family Medicine

## 2011-03-24 VITALS — BP 120/80 | HR 80 | Temp 98.8°F | Wt 126.0 lb

## 2011-03-24 DIAGNOSIS — M654 Radial styloid tenosynovitis [de Quervain]: Secondary | ICD-10-CM

## 2011-03-24 DIAGNOSIS — M25539 Pain in unspecified wrist: Secondary | ICD-10-CM

## 2011-03-24 NOTE — Progress Notes (Signed)
[name] [name], a 65 y.o. female presents today in the office for the following:    F/u recent L fall onto wrist, recent eval at [facility] emergency room. Larey Seat about 2 weeks ago tomorrow, hand onto ground, extended. Distal radius area hurts the most. 1st compartment area and around the DRUJ  Ostoporosis noted  Patient Active Problem List  Diagnoses  . GOITER, MULTINODULAR  . HYPOTHYROIDISM  . THYROIDITIS  . HYPERCHOLESTEROLEMIA  . DEPRESSION  . ESSENTIAL HYPERTENSION  . CLAUDICATION, INTERMITTENT  . ALLERGIC RHINITIS  . OSTEOARTHRITIS  . BUNION  . OSTEOPOROSIS  . SLEEP DISORDER  . EDEMA LEG  . TOBACCO USE, QUIT  . FEVER BLISTER  . Wrist pain  . Arm pain   Past Medical History  Diagnosis Date  . Depression   . Hypothyroidism   . Osteoarthritis   . Osteoporosis   . Tobacco abuse     past; quit 09  . Sleep disorder   . Shingles     arm 1/12   Past Surgical History  Procedure Date  . Hyerectomy- cervical ca cells   . Left wrist fracture     surgery  . Bunions 06/1998  . Hashimoto 02/1997  . Dexa 12/1996  . Biopsy thyroid     pos neoplasm, surgery 09/2006  . Cxr-copd, ts partial compression fracture 12/2006  . Right thyroid lobectomy, goiter, thyroiditis 12/2006  . Thyroidectomy 11/2009  . Stress fractures foot    History  Substance Use Topics  . Smoking status: Former Games developer  . Smokeless tobacco: Not on file   Comment: Quit 10/09  . Alcohol Use: Not on file   Family History  Problem Relation Age of Onset  . Diabetes Mother   . Coronary artery disease Mother   . Stroke Father   . Hypertension Father    No Known Allergies Current Outpatient Prescriptions on File Prior to Visit  Medication Sig Dispense Refill  . alendronate (FOSAMAX) 70 MG tablet Take 70 mg by mouth every 7 (seven) days. Take with a full glass of water on an empty stomach.       . Calcium Carbonate-Vit D-Min (CALCIUM 1200 PO) Take by mouth daily.        . fluticasone (VERAMYST) 27.5 MCG/SPRAY  nasal spray Place 2 sprays into the nose daily.        Marland Kitchen gabapentin (NEURONTIN) 300 MG capsule Take 300 mg by mouth. 1 by mouth at bedtiem can take 1 in am if needed for painful rash this will cause sleepiness       . levothyroxine (LEVOXYL) 75 MCG tablet Take 75 mcg by mouth daily.        Marland Kitchen lisinopril (ZESTRIL) 10 MG tablet Take 5 mg by mouth daily.       . nortriptyline (PAMELOR) 25 MG capsule Take 25 mg by mouth at bedtime. w 75 mg=with her 75 mg capsule also       . nortriptyline (PAMELOR) 75 MG capsule Take 1 capsule (75 mg total) by mouth at bedtime.  30 capsule  11  . pravastatin (PRAVACHOL) 20 MG tablet Take 1 tablet (20 mg total) by mouth every evening.  30 tablet  11  . simvastatin (ZOCOR) 80 MG tablet Take 40 mg by mouth daily.       . temazepam (RESTORIL) 30 MG capsule Take 1 capsule (30 mg total) by mouth at bedtime.  30 capsule  5   REVIEW OF SYSTEMS  GEN: No fevers, chills. Nontoxic. Primarily MSK  c/o today. MSK: Detailed in the HPI GI: tolerating PO intake without difficulty Neuro: No numbness, parasthesias, or tingling associated. Otherwise the pertinent positives of the ROS are noted above.    Physical Exam  Blood pressure 120/80, pulse 80, temperature 98.8 F (37.1 C), temperature source Oral, weight 126 lb (57.153 kg).  GEN: Well-developed,well-nourished,in no acute distress; alert,appropriate and cooperative throughout examination HEENT: Normocephalic and atraumatic without obvious abnormalities. Ears, externally no deformities PULM: Breathing comfortably in no respiratory distress EXT: No clubbing, cyanosis, or edema PSYCH: Normally interactive. Cooperative during the interview. Pleasant. Friendly and conversant. Not anxious or depressed appearing. Normal, full affect.  L hand Ecchymosis or edema: neg ROM wrist/hand/digits/elbow: but mild TTP with ulnar and radial deviation Carpals, MCP's, digits: NT Distal Ulna and Radius: NT Supination lift test:  neg Ecchymosis or edema: neg Cysts/nodules: neg Finkelstein's test: POS Snuffbox tenderness: MILD Scaphoid tubercle: NT Hook of Hamate: NT Resisted supination: NT Full composite fist Grip, all digits: 5/5 str No tenosynovitis Axial load test: POS Atrophy: mild  Hand sensation: intact  Assessment and Plan: 1.  Hand Pain 2. Dequairvains  Plan:  Xrays, Wrist, 3 Views: AP, Lateral, and Oblique, Scaphoid Hand x-rayed: L Indication: wrist pain Findings: No evidence of acute fracture or dislocation.  Probable bone contusion Dequairvains - immobilize for 3 weeks in thumb spica Cannot rule out cartilage injury in wrist

## 2011-03-24 NOTE — Patient Instructions (Signed)
Wear wrist splint for the next 3 weeks, then recheck in the office with me.  Bone bruise Dequairvain's tenosynovitis

## 2011-04-14 ENCOUNTER — Ambulatory Visit: Payer: Medicare Other | Admitting: Family Medicine

## 2011-04-14 ENCOUNTER — Ambulatory Visit (INDEPENDENT_AMBULATORY_CARE_PROVIDER_SITE_OTHER): Payer: Medicare Other | Admitting: Family Medicine

## 2011-04-14 ENCOUNTER — Encounter: Payer: Self-pay | Admitting: Family Medicine

## 2011-04-14 VITALS — BP 116/76 | HR 88 | Temp 98.5°F | Ht 63.25 in | Wt 125.2 lb

## 2011-04-14 DIAGNOSIS — Z01818 Encounter for other preprocedural examination: Secondary | ICD-10-CM | POA: Insufficient documentation

## 2011-04-14 DIAGNOSIS — E7800 Pure hypercholesterolemia, unspecified: Secondary | ICD-10-CM

## 2011-04-14 DIAGNOSIS — M545 Low back pain, unspecified: Secondary | ICD-10-CM

## 2011-04-14 DIAGNOSIS — E039 Hypothyroidism, unspecified: Secondary | ICD-10-CM

## 2011-04-14 DIAGNOSIS — R5381 Other malaise: Secondary | ICD-10-CM

## 2011-04-14 DIAGNOSIS — Z136 Encounter for screening for cardiovascular disorders: Secondary | ICD-10-CM

## 2011-04-14 DIAGNOSIS — R5383 Other fatigue: Secondary | ICD-10-CM

## 2011-04-14 DIAGNOSIS — I10 Essential (primary) hypertension: Secondary | ICD-10-CM

## 2011-04-14 LAB — CBC WITH DIFFERENTIAL/PLATELET
Basophils Absolute: 0 10*3/uL (ref 0.0–0.1)
Basophils Relative: 0.6 % (ref 0.0–3.0)
Eosinophils Absolute: 0.2 10*3/uL (ref 0.0–0.7)
Eosinophils Relative: 2.8 % (ref 0.0–5.0)
HCT: 40.7 % (ref 36.0–46.0)
Hemoglobin: 13.9 g/dL (ref 12.0–15.0)
Lymphocytes Relative: 42.3 % (ref 12.0–46.0)
Lymphs Abs: 2.9 10*3/uL (ref 0.7–4.0)
MCHC: 34.1 g/dL (ref 30.0–36.0)
MCV: 93.6 fl (ref 78.0–100.0)
Monocytes Absolute: 0.4 10*3/uL (ref 0.1–1.0)
Monocytes Relative: 6.3 % (ref 3.0–12.0)
Neutro Abs: 3.3 10*3/uL (ref 1.4–7.7)
Neutrophils Relative %: 48 % (ref 43.0–77.0)
Platelets: 276 10*3/uL (ref 150.0–400.0)
RBC: 4.35 Mil/uL (ref 3.87–5.11)
RDW: 14 % (ref 11.5–14.6)
WBC: 7 10*3/uL (ref 4.5–10.5)

## 2011-04-14 LAB — COMPREHENSIVE METABOLIC PANEL
ALT: 13 U/L (ref 0–35)
AST: 20 U/L (ref 0–37)
Albumin: 4.1 g/dL (ref 3.5–5.2)
Alkaline Phosphatase: 64 U/L (ref 39–117)
BUN: 13 mg/dL (ref 6–23)
CO2: 30 mEq/L (ref 19–32)
Calcium: 9.2 mg/dL (ref 8.4–10.5)
Chloride: 106 mEq/L (ref 96–112)
Creatinine, Ser: 0.7 mg/dL (ref 0.4–1.2)
GFR: 89.1 mL/min (ref >60.00)
Glucose, Bld: 87 mg/dL (ref 70–99)
Potassium: 4.2 mEq/L (ref 3.5–5.1)
Sodium: 143 mEq/L (ref 135–145)
Total Bilirubin: 0.6 mg/dL (ref 0.3–1.2)
Total Protein: 7 g/dL (ref 6.0–8.3)

## 2011-04-14 LAB — LIPID PANEL
Cholesterol: 280 mg/dL — ABNORMAL HIGH (ref 0–200)
HDL: 75.6 mg/dL (ref >39.00)
Total CHOL/HDL Ratio: 4
Triglycerides: 80 mg/dL (ref 0.0–149.0)
VLDL: 16 mg/dL (ref 0.0–40.0)

## 2011-04-14 LAB — POCT URINALYSIS DIPSTICK
Bilirubin, UA: NEGATIVE
Glucose, UA: NEGATIVE
Ketones, UA: NEGATIVE
Leukocytes, UA: NEGATIVE
Nitrite, UA: NEGATIVE
Protein, UA: NEGATIVE
Spec Grav, UA: 1.015
Urobilinogen, UA: 0.2
pH, UA: 5

## 2011-04-14 LAB — TSH: TSH: 1.07 u[IU]/mL (ref 0.35–5.50)

## 2011-04-14 LAB — LDL CHOLESTEROL, DIRECT: Direct LDL: 186.9 mg/dL

## 2011-04-14 NOTE — Assessment & Plan Note (Signed)
Good control with no issues or med changes

## 2011-04-14 NOTE — Assessment & Plan Note (Signed)
Due for tsh and pt is tired Labs and update

## 2011-04-14 NOTE — Patient Instructions (Addendum)
No restrictions for surgery I will send surgeon this note  Labs for fatigue/ thyroid and cholesterol today  Will update you with results  Good luck with your foot surgery Stop your fosamax for surgery and until foot is totally healed    ADDENDUM Please increase pravastatin from 20 to 40 mg once daily I sent this to [facility]  Please schedule fasting lab in 6 weeks for lipids  Avoid red meat/ fried foods/ egg yolks/ fatty breakfast meats/ butter, cheese and high fat dairy/ and shellfish  Also

## 2011-04-14 NOTE — Assessment & Plan Note (Signed)
New over the past mo Pt thinks this could be stress related  Due for thyroid and general lab check today Also check urine

## 2011-04-14 NOTE — Assessment & Plan Note (Signed)
Fairly normal exam  Check ua

## 2011-04-14 NOTE — Progress Notes (Signed)
Subjective:    Patient ID: [name] [name], female    DOB: 06/25/1946, 65 y.o.   MRN: [id number]  HPI Here for medical clearance for foot surgery- and generally not feeling well  Medial bunion on the R foot  Going to doctor at [facility]  Dr London Sheer at [facility] health care  Phone number is [phone]  Wt is stable   bp is good at 116/76  EKG shows short PR but otherwise NSR with rate of 81 and no acute changes   No trouble with anesthesia before  Has had surgeries in past  No n/v This will be general anethesia No all to local aneth either  No drug allergies  Used to smoke- no longer - breathing is good  No heart problems  bp in good control  No asa or nsaids or blood thinners   Is not feeling good in general  Not sleeping at night even with restoril  Tired during the day  No motivation  ? If poss depression issue  Is due for thyroid check  In addition - back has been sore  No urine symptoms at all   Patient Active Problem List  Diagnoses  . GOITER, MULTINODULAR  . HYPOTHYROIDISM  . THYROIDITIS  . HYPERCHOLESTEROLEMIA  . DEPRESSION  . ESSENTIAL HYPERTENSION  . ALLERGIC RHINITIS  . OSTEOARTHRITIS  . BUNION  . OSTEOPOROSIS  . SLEEP DISORDER  . TOBACCO USE, QUIT  . FEVER BLISTER  . Wrist pain  . Fatigue  . Preoperative examination  . Low back pain   Past Medical History  Diagnosis Date  . Depression   . Hypothyroidism   . Osteoarthritis   . Osteoporosis   . Tobacco abuse     past; quit 09  . Sleep disorder   . Shingles     arm 1/12   Past Surgical History  Procedure Date  . Hyerectomy- cervical ca cells   . Left wrist fracture     surgery  . Bunions 06/1998  . Hashimoto 02/1997  . Dexa 12/1996  . Biopsy thyroid     pos neoplasm, surgery 09/2006  . Cxr-copd, ts partial compression fracture 12/2006  . Right thyroid lobectomy, goiter, thyroiditis 12/2006  . Thyroidectomy 11/2009  . Stress fractures foot    History  Substance Use Topics  .  Smoking status: Former Games developer  . Smokeless tobacco: Not on file   Comment: Quit 10/09  . Alcohol Use: Not on file   Family History  Problem Relation Age of Onset  . Diabetes Mother   . Coronary artery disease Mother   . Stroke Father   . Hypertension Father    No Known Allergies Current Outpatient Prescriptions on File Prior to Visit  Medication Sig Dispense Refill  . alendronate (FOSAMAX) 70 MG tablet Take 70 mg by mouth every 7 (seven) days. Take with a full glass of water on an empty stomach.       . Calcium Carbonate-Vit D-Min (CALCIUM 1200 PO) Take by mouth daily.        . fluticasone (VERAMYST) 27.5 MCG/SPRAY nasal spray Place 2 sprays into the nose daily.        Marland Kitchen levothyroxine (LEVOXYL) 75 MCG tablet Take 75 mcg by mouth daily.        Marland Kitchen lisinopril (ZESTRIL) 10 MG tablet Take 5 mg by mouth daily.       . nortriptyline (PAMELOR) 25 MG capsule Take 25 mg by mouth at bedtime. w 75  mg=with her 75 mg capsule also       . nortriptyline (PAMELOR) 75 MG capsule Take 1 capsule (75 mg total) by mouth at bedtime.  30 capsule  11  . pravastatin (PRAVACHOL) 20 MG tablet Take 1 tablet (20 mg total) by mouth every evening.  30 tablet  11  . simvastatin (ZOCOR) 80 MG tablet Take 40 mg by mouth daily.       . temazepam (RESTORIL) 30 MG capsule Take 1 capsule (30 mg total) by mouth at bedtime.  30 capsule  5  . gabapentin (NEURONTIN) 300 MG capsule Take 300 mg by mouth. 1 by mouth at bedtiem can take 1 in am if needed for painful rash this will cause sleepiness       . ibuprofen (ADVIL,MOTRIN) 800 MG tablet Take one by mouth three times a day for 5 days       . oxyCODONE-acetaminophen (PERCOCET) 5-325 MG per tablet Take 1 tablet by mouth every 6 (six) hours as needed. Times 5 days                 Review of Systems Review of Systems  Constitutional: Negative for fever, appetite change,  and unexpected weight change. pos for fatigue  Eyes: Negative for pain and visual disturbance.    Respiratory: Negative for cough and shortness of breath.   Cardiovascular: Negative.  for cp or sob or palpitations  Gastrointestinal: Negative for nausea, diarrhea and constipation.  Genitourinary: Negative for urgency and frequency.  Skin: Negative for pallor. or rash  MSK pos for low back pain  Neurological: Negative for weakness, light-headedness, numbness and headaches.  Hematological: Negative for adenopathy. Does not bruise/bleed easily.  Psychiatric/Behavioral: Negative for dysphoric mood. The patient is not nervous/anxious.          Objective:   Physical Exam  Constitutional: She is oriented to person, place, and time. She appears well-developed and well-nourished. No distress.       Slim and well appearing   HENT:  Head: Normocephalic and atraumatic.  Nose: Nose normal.  Mouth/Throat: Oropharynx is clear and moist.  Eyes: Conjunctivae and EOM are normal. Pupils are equal, round, and reactive to light.  Neck: Normal range of motion. Neck supple. No JVD present. Carotid bruit is not present. No tracheal deviation present. No thyromegaly present.       S/p thyroidectomy  Cardiovascular: Normal rate, regular rhythm, normal heart sounds and intact distal pulses.   Pulmonary/Chest: Effort normal and breath sounds normal. No respiratory distress. She has no wheezes.  Abdominal: Soft. Bowel sounds are normal. She exhibits no distension and no mass. There is no tenderness.  Musculoskeletal: Normal range of motion. She exhibits no edema and no tenderness.       No LS tenderness Full rom No cva tenderness  Lymphadenopathy:    She has no cervical adenopathy.  Neurological: She is alert and oriented to person, place, and time. She has normal reflexes. No cranial nerve deficit. Coordination normal.       No tremor   Skin: Skin is warm and dry. No rash noted. No erythema. No pallor.  Psychiatric: She has a normal mood and affect.          Assessment & Plan:

## 2011-04-14 NOTE — Assessment & Plan Note (Signed)
No restrictions today for upcoming bunion surg at [facility]  EKG nl with short PR interval Past smoker - no breathing problems  No cardiac hx  Adv to stop fosamax until foot heals

## 2011-04-15 ENCOUNTER — Other Ambulatory Visit: Payer: Self-pay | Admitting: Family Medicine

## 2011-04-15 MED ORDER — PRAVASTATIN SODIUM 40 MG PO TABS: 40.0000 mg | ORAL_TABLET | Freq: Every evening | ORAL | Status: DC

## 2011-04-15 NOTE — Progress Notes (Signed)
Addended by: Roxy Manns A on: 04/15/2011 04:16 PM   Modules accepted: Orders

## 2011-04-27 ENCOUNTER — Telehealth: Payer: Self-pay | Admitting: *Deleted

## 2011-04-27 NOTE — Telephone Encounter (Signed)
Pt is asking about the status of the surgical clearance forms for her foot surgery.  She says her surgeon has not received them yet.

## 2011-04-27 NOTE — Telephone Encounter (Signed)
I thought it was all sent already - sorry Doctor's name at [facility] is in body of the last prog note 8/8 Please send that note along with last labs and ekg-thanks

## 2011-04-27 NOTE — Telephone Encounter (Signed)
Spoke with Dr Kandis Ban and he did received the information faxed on 04/15/11. Dr Kandis Ban wanted to know if pt needed cardiology consult. Dr Milinda Antis was at her desk I asked and she said she did not think so that pt had no cardiac hx and EKG was normal with short PR interval but if Dr Kandis Ban wanted cardiac clearance before surgery that was fine with her. Less than 2 minutes I was back on phone and dr had hung up. I called back to his office [phone] and was given Deidra his asst and I gave her the info and she said she would let the dr know. Rene Kocher took call back from Dr Kandis Ban and said I had left him on hold too long and that he thought he had everything he needed and he would let anestheologist review EKG. Patient notified as instructed by telephone that Dr Kandis Ban did receive faxed information.

## 2011-05-13 ENCOUNTER — Telehealth: Payer: Self-pay

## 2011-05-13 NOTE — Telephone Encounter (Signed)
[facility] faxed stat request for medical clearance records. Records were already faxed to podiatrist with confirmation doctor had received records. I called [phone] and spoke with Amithyst and she said this request is from anesthesia dept. Faxed requested records to [phone]. Sent request and auth form signed giving permission to send records to be scanned.

## 2011-05-21 ENCOUNTER — Ambulatory Visit: Payer: Self-pay | Admitting: Family Medicine

## 2011-07-23 ENCOUNTER — Other Ambulatory Visit: Payer: Self-pay | Admitting: *Deleted

## 2011-07-23 MED ORDER — LISINOPRIL 10 MG PO TABS: 5.0000 mg | ORAL_TABLET | Freq: Every day | ORAL | Status: DC

## 2011-08-12 ENCOUNTER — Other Ambulatory Visit: Payer: Self-pay | Admitting: *Deleted

## 2011-08-12 MED ORDER — LEVOTHYROXINE SODIUM 75 MCG PO TABS: 75.0000 ug | ORAL_TABLET | Freq: Every day | ORAL | Status: DC

## 2011-09-06 ENCOUNTER — Other Ambulatory Visit: Payer: Self-pay | Admitting: *Deleted

## 2011-09-06 NOTE — Telephone Encounter (Signed)
Is it okay to refill medication? 

## 2011-09-07 MED ORDER — LISINOPRIL 10 MG PO TABS: 5.0000 mg | ORAL_TABLET | Freq: Every day | ORAL | Status: DC

## 2011-09-07 NOTE — Telephone Encounter (Signed)
Will refill electronically  

## 2011-09-30 ENCOUNTER — Other Ambulatory Visit: Payer: Self-pay | Admitting: *Deleted

## 2011-09-30 MED ORDER — TEMAZEPAM 30 MG PO CAPS: 30.0000 mg | ORAL_CAPSULE | Freq: Every day | ORAL | Status: DC

## 2011-09-30 NOTE — Telephone Encounter (Signed)
Px written for call in   

## 2011-09-30 NOTE — Telephone Encounter (Signed)
Medication phoned to [facility] as instructed.  

## 2011-10-27 ENCOUNTER — Ambulatory Visit (INDEPENDENT_AMBULATORY_CARE_PROVIDER_SITE_OTHER): Payer: Medicare Other | Admitting: Family Medicine

## 2011-10-27 ENCOUNTER — Encounter: Payer: Self-pay | Admitting: Family Medicine

## 2011-10-27 VITALS — BP 120/78 | HR 60 | Temp 97.7°F | Wt 128.8 lb

## 2011-10-27 DIAGNOSIS — G8929 Other chronic pain: Secondary | ICD-10-CM

## 2011-10-27 DIAGNOSIS — M79673 Pain in unspecified foot: Secondary | ICD-10-CM | POA: Insufficient documentation

## 2011-10-27 DIAGNOSIS — B009 Herpesviral infection, unspecified: Secondary | ICD-10-CM

## 2011-10-27 DIAGNOSIS — M79609 Pain in unspecified limb: Secondary | ICD-10-CM

## 2011-10-27 MED ORDER — LEVOTHYROXINE SODIUM 75 MCG PO TABS: 75.0000 ug | ORAL_TABLET | Freq: Every day | ORAL | Status: DC

## 2011-10-27 MED ORDER — VALACYCLOVIR HCL 1 G PO TABS: ORAL_TABLET | ORAL | Status: DC

## 2011-10-27 MED ORDER — MUPIROCIN 2 % EX OINT: TOPICAL_OINTMENT | Freq: Two times a day (BID) | CUTANEOUS | Status: AC

## 2011-10-27 NOTE — Assessment & Plan Note (Signed)
With fx in past  Handicapped parking form filled out

## 2011-10-27 NOTE — Patient Instructions (Signed)
Use the oral valtrex when you get a cold sore  Take first course when you get it  For sore in nose-use the generic bactroban ointment twice daily Update if not starting to improve in a week or if worsening   Keep washing hands

## 2011-10-27 NOTE — Assessment & Plan Note (Signed)
Pt has recurrent fever blisters- mouth and nose Will tx with valtrex high dose 1 day tx and update  For current lesion in nose - also bactroban ointment for prev of bacterial infx Update if not starting to improve in a week or if worsening  - esp if inc swelling or pain

## 2011-10-27 NOTE — Progress Notes (Signed)
Subjective:    Patient ID: [name] [name], female    DOB: 25-May-1946, 66 y.o.   MRN: [id number]  HPI Has a sore on mouth -/ lip and also in nose - R nostril, also needs handicapped parking form for chronic foot pain   Has constant sinus drainage Uses veramyst   No fever No uri symptoms Feels fine overall  Has chronic foot pain from fractures and cannot walk long distances  Needs a permanent handicapped form filled out for sticker  Patient Active Problem List  Diagnoses  . GOITER, MULTINODULAR  . HYPOTHYROIDISM  . THYROIDITIS  . HYPERCHOLESTEROLEMIA  . DEPRESSION  . ESSENTIAL HYPERTENSION  . ALLERGIC RHINITIS  . OSTEOARTHRITIS  . BUNION  . OSTEOPOROSIS  . SLEEP DISORDER  . TOBACCO USE, QUIT  . FEVER BLISTER  . Wrist pain  . Fatigue  . Preoperative examination  . Low back pain  . Chronic foot pain   Past Medical History  Diagnosis Date  . Depression   . Hypothyroidism   . Osteoarthritis   . Osteoporosis   . Tobacco abuse     past; quit 09  . Sleep disorder   . Shingles     arm 1/12   Past Surgical History  Procedure Date  . Hyerectomy- cervical ca cells   . Left wrist fracture     surgery  . Bunions 06/1998  . Hashimoto 02/1997  . Dexa 12/1996  . Biopsy thyroid     pos neoplasm, surgery 09/2006  . Cxr-copd, ts partial compression fracture 12/2006  . Right thyroid lobectomy, goiter, thyroiditis 12/2006  . Thyroidectomy 11/2009  . Stress fractures foot    History  Substance Use Topics  . Smoking status: Former Games developer  . Smokeless tobacco: Never Used   Comment: Quit 10/09  . Alcohol Use: No   Family History  Problem Relation Age of Onset  . Diabetes Mother   . Coronary artery disease Mother   . Stroke Father   . Hypertension Father    No Known Allergies Current Outpatient Prescriptions on File Prior to Visit  Medication Sig Dispense Refill  . alendronate (FOSAMAX) 70 MG tablet Take 70 mg by mouth every 7 (seven) days. Take with a full glass of  water on an empty stomach.       . Calcium Carbonate-Vit D-Min (CALCIUM 1200 PO) Take by mouth daily.        . fluticasone (VERAMYST) 27.5 MCG/SPRAY nasal spray Place 2 sprays into the nose daily.        Marland Kitchen gabapentin (NEURONTIN) 300 MG capsule Take 300 mg by mouth. 1 by mouth at bedtiem can take 1 in am if needed for painful rash this will cause sleepiness       . ibuprofen (ADVIL,MOTRIN) 800 MG tablet Take one by mouth three times a day for 5 days       . lisinopril (ZESTRIL) 10 MG tablet Take 0.5 tablets (5 mg total) by mouth daily.  30 tablet  3  . nortriptyline (PAMELOR) 25 MG capsule Take 25 mg by mouth at bedtime. w 75 mg=with her 75 mg capsule also       . nortriptyline (PAMELOR) 75 MG capsule Take 1 capsule (75 mg total) by mouth at bedtime.  30 capsule  11  . oxyCODONE-acetaminophen (PERCOCET) 5-325 MG per tablet Take 1 tablet by mouth every 6 (six) hours as needed. Times 5 days       . pravastatin (PRAVACHOL) 40 MG tablet Take 1  tablet (40 mg total) by mouth every evening.  30 tablet  11  . temazepam (RESTORIL) 30 MG capsule Take 1 capsule (30 mg total) by mouth at bedtime.  30 capsule  5       Review of Systems Review of Systems  Constitutional: Negative for fever, appetite change, fatigue and unexpected weight change.  Eyes: Negative for pain and visual disturbance.  Respiratory: Negative for cough and shortness of breath.   Cardiovascular: Negative for cp or palpitations    Gastrointestinal: Negative for nausea, diarrhea and constipation.  Genitourinary: Negative for urgency and frequency.  Skin: Negative for pallor or rash   MSK pos for foot pain chronic  Neurological: Negative for weakness, light-headedness, numbness and headaches.  Hematological: Negative for adenopathy. Does not bruise/bleed easily.  Psychiatric/Behavioral: Negative for dysphoric mood. The patient is not nervous/anxious.          Objective:   Physical Exam  Constitutional: She appears well-developed  and well-nourished. No distress.  HENT:  Head: Normocephalic and atraumatic.  Right Ear: External ear normal.  Left Ear: External ear normal.  Mouth/Throat: Oropharynx is clear and moist.       Healing cold sore/ herpes lesion on R lower lip R nostril - 2-3 mm papule on septum - erythematous and tender  No pus or drainage Nares otherwise clear No sinus tenderness  Eyes: Conjunctivae and EOM are normal. Pupils are equal, round, and reactive to light. Right eye exhibits no discharge. Left eye exhibits no discharge.  Neck: Normal range of motion. Neck supple.  Cardiovascular: Normal rate and regular rhythm.   Pulmonary/Chest: Effort normal and breath sounds normal.       Diffusely distant bs   Lymphadenopathy:    She has no cervical adenopathy.  Skin: Skin is warm and dry. No pallor.  Psychiatric: She has a normal mood and affect.          Assessment & Plan:

## 2011-11-01 ENCOUNTER — Telehealth: Payer: Self-pay | Admitting: *Deleted

## 2011-11-01 MED ORDER — LEVOTHYROXINE SODIUM 75 MCG PO TABS: 75.0000 ug | ORAL_TABLET | Freq: Every day | ORAL | Status: DC

## 2011-11-01 NOTE — Telephone Encounter (Signed)
I sent this to her pharmacy  

## 2011-11-01 NOTE — Telephone Encounter (Signed)
Patient request change to Levothyroxine 75 mcg b/c it will save her 11.00 per month

## 2012-03-17 ENCOUNTER — Other Ambulatory Visit: Payer: Self-pay | Admitting: *Deleted

## 2012-03-17 MED ORDER — NORTRIPTYLINE HCL 75 MG PO CAPS: 75.0000 mg | ORAL_CAPSULE | Freq: Every day | ORAL | Status: DC

## 2012-03-17 NOTE — Telephone Encounter (Signed)
Will refill electronically  

## 2012-03-17 NOTE — Telephone Encounter (Signed)
Faxed refill request from [facility], last filled 02/16/12 for # 30.

## 2012-03-28 ENCOUNTER — Other Ambulatory Visit: Payer: Self-pay | Admitting: *Deleted

## 2012-03-28 MED ORDER — TEMAZEPAM 30 MG PO CAPS: 30.0000 mg | ORAL_CAPSULE | Freq: Every day | ORAL | Status: DC

## 2012-03-28 NOTE — Telephone Encounter (Signed)
Px written for call in   

## 2012-03-28 NOTE — Telephone Encounter (Signed)
Received faxed refill request from pharmacy. Last office visit 10/27/11. Is it okay to refill medication?

## 2012-03-29 NOTE — Telephone Encounter (Signed)
Called in Rx as directed.  

## 2012-04-20 ENCOUNTER — Other Ambulatory Visit: Payer: Self-pay

## 2012-04-20 MED ORDER — LISINOPRIL 10 MG PO TABS: 5.0000 mg | ORAL_TABLET | Freq: Every day | ORAL | Status: DC

## 2012-04-20 NOTE — Telephone Encounter (Signed)
Request for Lisinopril 10 mg. Last OV 10/27/11 acute for fever blister. Last labs 04/14/11. Last filled 09/06/11. Ok to refill?

## 2012-04-20 NOTE — Telephone Encounter (Signed)
Patient advised.  Will call in later for appt.

## 2012-04-20 NOTE — Telephone Encounter (Signed)
Schedule her f/u appt in fall please  Will refill electronically

## 2012-05-05 ENCOUNTER — Other Ambulatory Visit: Payer: Self-pay

## 2012-05-05 MED ORDER — PRAVASTATIN SODIUM 40 MG PO TABS: 40.0000 mg | ORAL_TABLET | Freq: Every evening | ORAL | Status: DC

## 2012-05-05 NOTE — Telephone Encounter (Signed)
Faxed request from [facility] for Pravastatin 40mg . Last filled 04/15/11. Ok to refill?

## 2012-05-05 NOTE — Telephone Encounter (Signed)
She needs to schedule an appt this fall please to see me and we will do labs that day  Will refil several months  Will refill electronically

## 2012-05-24 ENCOUNTER — Encounter: Payer: Self-pay | Admitting: Family Medicine

## 2012-05-24 ENCOUNTER — Ambulatory Visit (INDEPENDENT_AMBULATORY_CARE_PROVIDER_SITE_OTHER): Payer: Medicare Other | Admitting: Family Medicine

## 2012-05-24 VITALS — BP 122/78 | HR 76 | Temp 97.6°F | Ht 64.5 in | Wt 125.8 lb

## 2012-05-24 DIAGNOSIS — F329 Major depressive disorder, single episode, unspecified: Secondary | ICD-10-CM

## 2012-05-24 DIAGNOSIS — I10 Essential (primary) hypertension: Secondary | ICD-10-CM

## 2012-05-24 DIAGNOSIS — G479 Sleep disorder, unspecified: Secondary | ICD-10-CM

## 2012-05-24 DIAGNOSIS — E7800 Pure hypercholesterolemia, unspecified: Secondary | ICD-10-CM

## 2012-05-24 DIAGNOSIS — E039 Hypothyroidism, unspecified: Secondary | ICD-10-CM

## 2012-05-24 DIAGNOSIS — Z23 Encounter for immunization: Secondary | ICD-10-CM

## 2012-05-24 LAB — COMPREHENSIVE METABOLIC PANEL
ALT: 11 U/L (ref 0–35)
AST: 16 U/L (ref 0–37)
Albumin: 3.8 g/dL (ref 3.5–5.2)
Alkaline Phosphatase: 66 U/L (ref 39–117)
BUN: 13 mg/dL (ref 6–23)
CO2: 30 mEq/L (ref 19–32)
Calcium: 9.1 mg/dL (ref 8.4–10.5)
Chloride: 102 mEq/L (ref 96–112)
Creatinine, Ser: 0.7 mg/dL (ref 0.4–1.2)
GFR: 93.4 mL/min (ref >60.00)
Glucose, Bld: 88 mg/dL (ref 70–99)
Potassium: 4.5 mEq/L (ref 3.5–5.1)
Sodium: 140 mEq/L (ref 135–145)
Total Bilirubin: 0.4 mg/dL (ref 0.3–1.2)
Total Protein: 7 g/dL (ref 6.0–8.3)

## 2012-05-24 LAB — LIPID PANEL
Cholesterol: 317 mg/dL — ABNORMAL HIGH (ref 0–200)
HDL: 78.4 mg/dL (ref >39.00)
Total CHOL/HDL Ratio: 4
Triglycerides: 99 mg/dL (ref 0.0–149.0)
VLDL: 19.8 mg/dL (ref 0.0–40.0)

## 2012-05-24 LAB — TSH: TSH: 0.42 u[IU]/mL (ref 0.35–5.50)

## 2012-05-24 LAB — LDL CHOLESTEROL, DIRECT: Direct LDL: 208.7 mg/dL

## 2012-05-24 MED ORDER — NORTRIPTYLINE HCL 75 MG PO CAPS: 75.0000 mg | ORAL_CAPSULE | Freq: Every day | ORAL | Status: DC

## 2012-05-24 MED ORDER — NORTRIPTYLINE HCL 25 MG PO CAPS: 25.0000 mg | ORAL_CAPSULE | Freq: Every day | ORAL | Status: DC

## 2012-05-24 NOTE — Assessment & Plan Note (Signed)
bp in fair control at this time  No changes needed  Disc lifstyle change with low sodium diet and exercise   Labs today 

## 2012-05-24 NOTE — Assessment & Plan Note (Signed)
tsh today Is having sleep issues and 3 lb wt loss with fatigue Will advise after result

## 2012-05-24 NOTE — Assessment & Plan Note (Signed)
A little worse without the extra nortriptyline and less sleep Will refill that  Update if not imp  Pt had very bright affect today however

## 2012-05-24 NOTE — Assessment & Plan Note (Signed)
Worse with less nortriptyline Will refil the extra 25 mg Also stay on restoril  Check thyroid Update  F/u 6 mo

## 2012-05-24 NOTE — Assessment & Plan Note (Signed)
Lab today on statin and diet  Rev low sat fat diet   

## 2012-05-24 NOTE — Patient Instructions (Signed)
Flu shot today  Labs today  Will send px to your pharmacy Get back on the extra 25 mg of pamelor/ nortriptyline Stay active  If you are interested in a shingles/zoster vaccine - call your insurance to check on coverage,( you should not get it within 1 month of other vaccines) , then call us for a prescription  for it to take to a pharmacy that gives the shot  Follow up with me for annual exam in 6 months with labs prior

## 2012-05-24 NOTE — Progress Notes (Signed)
Subjective:    Patient ID: [name] [name], female    DOB: February 28, 1946, 66 y.o.   MRN: [id number]  HPI Here for f/u of chronic conditions  Not feeling very good overall  On temazepam  On nortyptyline - but out of the 25 (just taking the 75) That could be why  No new stressors or pain issues   Does not feel like doing anything when she does not sleep  Just looses motivation   Wt is down 3 lb with bmi of 21 Appetite comes and goes Eats healthy however   Hypothyroid Lab Results  Component Value Date   TSH 1.07 04/14/2011   due for check - sleep issues and wt loss No change in her neck   bp is stable today  No cp or palpitations or headaches or edema  No side effects to medicines  BP Readings from Last 3 Encounters:  05/24/12 122/78  10/27/11 120/78  04/14/11 116/76      Disc goals for lipids and reasons to control them- on pravachol Due for a check  Rev labs with pt- has been a year Lab Results  Component Value Date   CHOL 280* 04/14/2011   HDL 75.60 04/14/2011   LDLCALC 129* 04/18/2009   LDLDIRECT 186.9 04/14/2011   TRIG 80.0 04/14/2011   CHOLHDL 4 04/14/2011    Rev low sat fat diet in detail   Patient Active Problem List  Diagnosis  . GOITER, MULTINODULAR  . HYPOTHYROIDISM  . THYROIDITIS  . HYPERCHOLESTEROLEMIA  . DEPRESSION  . ESSENTIAL HYPERTENSION  . ALLERGIC RHINITIS  . OSTEOARTHRITIS  . BUNION  . OSTEOPOROSIS  . SLEEP DISORDER  . TOBACCO USE, QUIT  . FEVER BLISTER  . Wrist pain  . Fatigue  . Preoperative examination  . Low back pain  . Chronic foot pain   Past Medical History  Diagnosis Date  . Depression   . Hypothyroidism   . Osteoarthritis   . Osteoporosis   . Tobacco abuse     past; quit 09  . Sleep disorder   . Shingles     arm 1/12   Past Surgical History  Procedure Date  . Hyerectomy- cervical ca cells   . Left wrist fracture     surgery  . Bunions 06/1998  . Hashimoto 02/1997  . Dexa 12/1996  . Biopsy thyroid     pos neoplasm,  surgery 09/2006  . Cxr-copd, ts partial compression fracture 12/2006  . Right thyroid lobectomy, goiter, thyroiditis 12/2006  . Thyroidectomy 11/2009  . Stress fractures foot    History  Substance Use Topics  . Smoking status: Former Games developer  . Smokeless tobacco: Never Used   Comment: Quit 10/09  . Alcohol Use: No   Family History  Problem Relation Age of Onset  . Diabetes Mother   . Coronary artery disease Mother   . Stroke Father   . Hypertension Father    No Known Allergies Current Outpatient Prescriptions on File Prior to Visit  Medication Sig Dispense Refill  . alendronate (FOSAMAX) 70 MG tablet Take 70 mg by mouth every 7 (seven) days. Take with a full glass of water on an empty stomach.       . Calcium Carbonate-Vit D-Min (CALCIUM 1200 PO) Take by mouth daily.        . fluticasone (VERAMYST) 27.5 MCG/SPRAY nasal spray Place 2 sprays into the nose daily.        Marland Kitchen gabapentin (NEURONTIN) 300 MG capsule Take 300 mg by  mouth. 1 by mouth at bedtiem can take 1 in am if needed for painful rash this will cause sleepiness       . ibuprofen (ADVIL,MOTRIN) 800 MG tablet Take one by mouth three times a day for 5 days       . levothyroxine (SYNTHROID, LEVOTHROID) 75 MCG tablet Take 1 tablet (75 mcg total) by mouth daily.  30 tablet  11  . lisinopril (ZESTRIL) 10 MG tablet Take 0.5 tablets (5 mg total) by mouth daily.  30 tablet  3  . nortriptyline (PAMELOR) 75 MG capsule Take 1 capsule (75 mg total) by mouth at bedtime.  30 capsule  11  . oxyCODONE-acetaminophen (PERCOCET) 5-325 MG per tablet Take 1 tablet by mouth every 6 (six) hours as needed. Times 5 days       . pravastatin (PRAVACHOL) 40 MG tablet Take 1 tablet (40 mg total) by mouth every evening.  30 tablet  3  . temazepam (RESTORIL) 30 MG capsule Take 1 capsule (30 mg total) by mouth at bedtime.  30 capsule  5  . valACYclovir (VALTREX) 1000 MG tablet Take 2 pills by mouth twice daily for one day as needed for cold sore  4 tablet  5       Review of Systems Review of Systems  Constitutional: Negative for fever, appetite change, and unexpected weight change. pos for fatigue and poor sleep Eyes: Negative for pain and visual disturbance.  Respiratory: Negative for cough and shortness of breath.   Cardiovascular: Negative for cp or palpitations    Gastrointestinal: Negative for nausea, diarrhea and constipation.  Genitourinary: Negative for urgency and frequency.  Skin: Negative for pallor or rash   Neurological: Negative for weakness, light-headedness, numbness and headaches.  Hematological: Negative for adenopathy. Does not bruise/bleed easily.  Psychiatric/Behavioral: Negative for dysphoric mood. The patient is not nervous/anxious.  pos for fatigue and loss of motivation       Objective:   Physical Exam  Constitutional: She appears well-developed and well-nourished. No distress.  HENT:  Head: Normocephalic and atraumatic.  Right Ear: External ear normal.  Left Ear: External ear normal.  Mouth/Throat: Oropharynx is clear and moist.       Cerumen noted  Eyes: Conjunctivae normal and EOM are normal. Pupils are equal, round, and reactive to light. No scleral icterus.  Neck: Normal range of motion. Neck supple. No JVD present. Carotid bruit is not present. No thyromegaly present.       Thyroid surgical changes noted  Cardiovascular: Normal rate, regular rhythm, normal heart sounds and intact distal pulses.  Exam reveals no gallop.   Pulmonary/Chest: Effort normal and breath sounds normal. No respiratory distress. She has no wheezes.  Abdominal: Soft. Bowel sounds are normal. She exhibits no distension. There is no tenderness.  Musculoskeletal: She exhibits no edema.  Lymphadenopathy:    She has no cervical adenopathy.  Neurological: She is alert. She has normal reflexes. No cranial nerve deficit. She exhibits normal muscle tone. Coordination normal.  Skin: Skin is warm and dry. No rash noted. No erythema. No pallor.   Psychiatric: She has a normal mood and affect. Her mood appears not anxious. Cognition and memory are normal. She does not exhibit a depressed mood.       Seems fatigued but normal affect          Assessment & Plan:

## 2012-05-26 ENCOUNTER — Other Ambulatory Visit: Payer: Self-pay | Admitting: *Deleted

## 2012-05-26 MED ORDER — ATORVASTATIN CALCIUM 40 MG PO TABS: 40.0000 mg | ORAL_TABLET | Freq: Every day | ORAL | Status: DC

## 2012-07-10 ENCOUNTER — Other Ambulatory Visit (INDEPENDENT_AMBULATORY_CARE_PROVIDER_SITE_OTHER): Payer: Medicare Other

## 2012-07-10 DIAGNOSIS — E785 Hyperlipidemia, unspecified: Secondary | ICD-10-CM

## 2012-07-10 LAB — LIPID PANEL
Cholesterol: 243 mg/dL — ABNORMAL HIGH (ref 0–200)
HDL: 84.8 mg/dL (ref >39.00)
Total CHOL/HDL Ratio: 3
Triglycerides: 85 mg/dL (ref 0.0–149.0)
VLDL: 17 mg/dL (ref 0.0–40.0)

## 2012-07-10 LAB — LDL CHOLESTEROL, DIRECT: Direct LDL: 140.9 mg/dL

## 2012-07-10 LAB — ALT: ALT: 16 U/L (ref 0–35)

## 2012-07-10 LAB — AST: AST: 22 U/L (ref 0–37)

## 2012-09-29 ENCOUNTER — Other Ambulatory Visit: Payer: Self-pay | Admitting: Family Medicine

## 2012-09-29 MED ORDER — TEMAZEPAM 30 MG PO CAPS: 30.0000 mg | ORAL_CAPSULE | Freq: Every day | ORAL | Status: DC

## 2012-09-29 NOTE — Telephone Encounter (Signed)
Px written for call in   

## 2012-09-29 NOTE — Telephone Encounter (Signed)
Rx called in to pharm. as prescribed  

## 2012-09-29 NOTE — Telephone Encounter (Signed)
Received fax refill request, ok to refill  

## 2012-11-29 ENCOUNTER — Other Ambulatory Visit: Payer: Self-pay | Admitting: Family Medicine

## 2012-11-29 MED ORDER — LEVOTHYROXINE SODIUM 75 MCG PO TABS: 75.0000 ug | ORAL_TABLET | Freq: Every day | ORAL | Status: DC

## 2013-01-01 ENCOUNTER — Other Ambulatory Visit: Payer: Self-pay | Admitting: *Deleted

## 2013-01-01 MED ORDER — LISINOPRIL 10 MG PO TABS: 5.0000 mg | ORAL_TABLET | Freq: Every day | ORAL | Status: DC

## 2013-01-08 ENCOUNTER — Encounter: Payer: Self-pay | Admitting: Family Medicine

## 2013-01-08 ENCOUNTER — Ambulatory Visit (INDEPENDENT_AMBULATORY_CARE_PROVIDER_SITE_OTHER): Payer: Medicare Other | Admitting: Family Medicine

## 2013-01-08 VITALS — BP 122/82 | HR 95 | Temp 98.9°F | Ht 64.5 in | Wt 125.2 lb

## 2013-01-08 DIAGNOSIS — F329 Major depressive disorder, single episode, unspecified: Secondary | ICD-10-CM

## 2013-01-08 DIAGNOSIS — L723 Sebaceous cyst: Secondary | ICD-10-CM | POA: Insufficient documentation

## 2013-01-08 DIAGNOSIS — I10 Essential (primary) hypertension: Secondary | ICD-10-CM

## 2013-01-08 DIAGNOSIS — E039 Hypothyroidism, unspecified: Secondary | ICD-10-CM

## 2013-01-08 NOTE — Patient Instructions (Addendum)
I'm glad you are doing better  Lab for thyroid today Keep cyst on neck clean and if it develops redness or pain let me know  If it gets bigger again, we can refer you to a dermatologist to remove the whole thing  Follow up in 6 months for annual exam with labs prior

## 2013-01-08 NOTE — Assessment & Plan Note (Signed)
bp in fair control at this time  No changes needed  Disc lifstyle change with low sodium diet and exercise   

## 2013-01-08 NOTE — Assessment & Plan Note (Signed)
Clinically stable with no complaints Unchanged exam s/p thyroidectomy  tsh today

## 2013-01-08 NOTE — Assessment & Plan Note (Signed)
Much improved back on nortriptyline  Pt is active and sleeping/ eating well now

## 2013-01-08 NOTE — Progress Notes (Signed)
Subjective:    Patient ID: [name] [name], female    DOB: 11-03-1945, 67 y.o.   MRN: [id number]  HPI Here for f/u of chronic health problems  Is doing very well! Feeling better than last time  Allergies are bad this time of year    Wt is stable with bmi of 21  Last visit had run out of nortriptyline and was not sleeping/ feeling depressed Sleeping well now  Mood is good also - feels much better   Cholesterol Better with pravachol Lab Results  Component Value Date   CHOL 243* 07/10/2012   HDL 84.80 07/10/2012   LDLCALC 129* 04/18/2009   LDLDIRECT 140.9 07/10/2012   TRIG 85.0 07/10/2012   CHOLHDL 3 07/10/2012    Diet - is trying to eat healthy- no red meat or fried foods   bp is stable today  No cp or palpitations or headaches or edema  No side effects to medicines  BP Readings from Last 3 Encounters:  01/08/13 122/82  05/24/12 122/78  10/27/11 120/78     Hypothyroid Hypothyroidism  Pt has no clinical changes- feels stable for the most part Once in a while a pill will get hung in throat- but she does not see a goiter  No change in energy level/ hair or skin/ edema and no tremor Lab Results  Component Value Date   TSH 0.42 05/24/2012      Also has a knot just under her chin that has been there a while but getting bigger  Not red or painful  It is bothersome  No drainage  Patient Active Problem List   Diagnosis Date Noted  . Sebaceous cyst 01/08/2013  . Chronic foot pain 10/27/2011  . Fatigue 04/14/2011  . Preoperative examination 04/14/2011  . Low back pain 04/14/2011  . Wrist pain 03/17/2011  . FEVER BLISTER 11/17/2010  . ESSENTIAL HYPERTENSION 08/18/2009  . BUNION 03/04/2009  . ALLERGIC RHINITIS 01/10/2008  . TOBACCO USE, QUIT 02/10/2007  . GOITER, MULTINODULAR 02/09/2007  . HYPOTHYROIDISM 02/09/2007  . THYROIDITIS 02/09/2007  . HYPERCHOLESTEROLEMIA 02/09/2007  . DEPRESSION 02/09/2007  . OSTEOARTHRITIS 02/09/2007  . OSTEOPOROSIS 02/09/2007  . SLEEP  DISORDER 02/09/2007   Past Medical History  Diagnosis Date  . Depression   . Hypothyroidism   . Osteoarthritis   . Osteoporosis   . Tobacco abuse     past; quit 09  . Sleep disorder   . Shingles     arm 1/12   Past Surgical History  Procedure Laterality Date  . Hyerectomy- cervical ca cells    . Left wrist fracture      surgery  . Bunions  06/1998  . Hashimoto  02/1997  . Dexa  12/1996  . Biopsy thyroid      pos neoplasm, surgery 09/2006  . Cxr-copd, ts partial compression fracture  12/2006  . Right thyroid lobectomy, goiter, thyroiditis  12/2006  . Thyroidectomy  11/2009  . Stress fractures foot     History  Substance Use Topics  . Smoking status: Former Games developer  . Smokeless tobacco: Never Used     Comment: Quit 10/09  . Alcohol Use: No   Family History  Problem Relation Age of Onset  . Diabetes Mother   . Coronary artery disease Mother   . Stroke Father   . Hypertension Father    No Known Allergies Current Outpatient Prescriptions on File Prior to Visit  Medication Sig Dispense Refill  . alendronate (FOSAMAX) 70 MG tablet Take 70 mg  by mouth every 7 (seven) days. Take with a full glass of water on an empty stomach.       Marland Kitchen atorvastatin (LIPITOR) 40 MG tablet Take 1 tablet (40 mg total) by mouth daily.  30 tablet  11  . Calcium Carbonate-Vit D-Min (CALCIUM 1200 PO) Take by mouth daily.        . fluticasone (VERAMYST) 27.5 MCG/SPRAY nasal spray Place 2 sprays into the nose daily.        Marland Kitchen gabapentin (NEURONTIN) 300 MG capsule Take 300 mg by mouth. 1 by mouth at bedtiem can take 1 in am if needed for painful rash this will cause sleepiness       . ibuprofen (ADVIL,MOTRIN) 800 MG tablet Take one by mouth three times a day for 5 days       . levothyroxine (SYNTHROID, LEVOTHROID) 75 MCG tablet Take 1 tablet (75 mcg total) by mouth daily.  30 tablet  1  . lisinopril (ZESTRIL) 10 MG tablet Take 0.5 tablets (5 mg total) by mouth daily.  30 tablet  3  . nortriptyline (PAMELOR)  25 MG capsule Take 1 capsule (25 mg total) by mouth at bedtime. w 75 mg=with her 75 mg capsule also  30 capsule  11  . nortriptyline (PAMELOR) 75 MG capsule Take 1 capsule (75 mg total) by mouth at bedtime.  30 capsule  11  . oxyCODONE-acetaminophen (PERCOCET) 5-325 MG per tablet Take 1 tablet by mouth every 6 (six) hours as needed. Times 5 days       . pravastatin (PRAVACHOL) 40 MG tablet Take 1 tablet (40 mg total) by mouth every evening.  30 tablet  3  . temazepam (RESTORIL) 30 MG capsule Take 1 capsule (30 mg total) by mouth at bedtime.  30 capsule  5  . valACYclovir (VALTREX) 1000 MG tablet Take 2 pills by mouth twice daily for one day as needed for cold sore  4 tablet  5   No current facility-administered medications on file prior to visit.    Review of Systems Review of Systems  Constitutional: Negative for fever, appetite change, fatigue and unexpected weight change.  Eyes: Negative for pain and visual disturbance.  Respiratory: Negative for cough and shortness of breath.   Cardiovascular: Negative for cp or palpitations    Gastrointestinal: Negative for nausea, diarrhea and constipation.  Genitourinary: Negative for urgency and frequency.  Skin: Negative for pallor or rash pos for knot under chin that has grown   Neurological: Negative for weakness, light-headedness, numbness and headaches.  Hematological: Negative for adenopathy. Does not bruise/bleed easily.  Psychiatric/Behavioral: Negative for dysphoric mood. The patient is not nervous/anxious.         Objective:   Physical Exam  Constitutional: She appears well-developed and well-nourished. No distress.  HENT:  Head: Normocephalic and atraumatic.  Mouth/Throat: Oropharynx is clear and moist.  Eyes: Conjunctivae and EOM are normal. Pupils are equal, round, and reactive to light. No scleral icterus.  Neck: Normal range of motion. Neck supple. No JVD present. Carotid bruit is not present. No thyromegaly present.  No  goiter Thyroid scar is unchanged   Cardiovascular: Normal rate, regular rhythm, normal heart sounds and intact distal pulses.  Exam reveals no gallop.   Pulmonary/Chest: Effort normal and breath sounds normal. No respiratory distress. She has no wheezes.  Abdominal: Soft. Bowel sounds are normal. She exhibits no distension, no abdominal bruit and no mass. There is no tenderness.  Musculoskeletal: She exhibits no edema.  Lymphadenopathy:  She has no cervical adenopathy.  Neurological: She is alert. She has normal reflexes. She displays no tremor. No cranial nerve deficit. She exhibits normal muscle tone. Coordination normal.  Skin: Skin is warm and dry. No rash noted. No erythema. No pallor.  1 cm sebaceous cyst with open pore just under mandible Area cleaned and then caseous material expressed  No erythema or tenderness    Psychiatric: She has a normal mood and affect.          Assessment & Plan:

## 2013-01-08 NOTE — Assessment & Plan Note (Signed)
1 cm non infected cyst under mandible - was cleaned and fair amt of caseous material expressed with relief inst given to keep it clean and report any s/s of infection Pt aware this may persist or return -and in that case total removal by a dermatologist would be necessary

## 2013-01-09 LAB — TSH: TSH: 0.69 u[IU]/mL (ref 0.35–5.50)

## 2013-01-10 ENCOUNTER — Encounter: Payer: Self-pay | Admitting: *Deleted

## 2013-02-02 ENCOUNTER — Other Ambulatory Visit: Payer: Self-pay | Admitting: *Deleted

## 2013-02-02 MED ORDER — LEVOTHYROXINE SODIUM 75 MCG PO TABS: 75.0000 ug | ORAL_TABLET | Freq: Every day | ORAL | Status: DC

## 2013-04-04 ENCOUNTER — Other Ambulatory Visit: Payer: Self-pay | Admitting: Family Medicine

## 2013-04-04 MED ORDER — TEMAZEPAM 30 MG PO CAPS: 30.0000 mg | ORAL_CAPSULE | Freq: Every day | ORAL | Status: DC

## 2013-04-04 NOTE — Telephone Encounter (Signed)
Fax refill request, please advise  

## 2013-04-04 NOTE — Telephone Encounter (Signed)
Px written for call in   

## 2013-04-04 NOTE — Telephone Encounter (Signed)
Rx called in as prescribed 

## 2013-05-25 ENCOUNTER — Other Ambulatory Visit: Payer: Self-pay | Admitting: *Deleted

## 2013-05-25 MED ORDER — ATORVASTATIN CALCIUM 40 MG PO TABS: 40.0000 mg | ORAL_TABLET | Freq: Every day | ORAL | Status: DC

## 2013-05-25 MED ORDER — NORTRIPTYLINE HCL 25 MG PO CAPS: 25.0000 mg | ORAL_CAPSULE | Freq: Every day | ORAL | Status: DC

## 2013-05-29 ENCOUNTER — Other Ambulatory Visit: Payer: Self-pay | Admitting: Family Medicine

## 2013-05-29 MED ORDER — NORTRIPTYLINE HCL 75 MG PO CAPS: 75.0000 mg | ORAL_CAPSULE | Freq: Every day | ORAL | Status: DC

## 2013-05-29 NOTE — Telephone Encounter (Signed)
Fax refill request, please advise  

## 2013-05-29 NOTE — Telephone Encounter (Signed)
done

## 2013-05-29 NOTE — Telephone Encounter (Signed)
Please refill for 6 months 

## 2013-06-04 NOTE — Telephone Encounter (Signed)
Pt checking on status of refill pamelor 25 mg. Left v/m at [facility](unable to talk with someone) to refill med per 05/25/13 refill. Pt will ck with pharmacy later.

## 2013-06-05 ENCOUNTER — Other Ambulatory Visit: Payer: Self-pay | Admitting: Family Medicine

## 2013-06-05 NOTE — Telephone Encounter (Signed)
Fax refill request, please advise  

## 2013-06-05 NOTE — Telephone Encounter (Signed)
Please refill for a year  

## 2013-06-06 MED ORDER — NORTRIPTYLINE HCL 25 MG PO CAPS: 25.0000 mg | ORAL_CAPSULE | Freq: Every day | ORAL | Status: DC

## 2013-06-06 NOTE — Telephone Encounter (Signed)
done

## 2013-07-04 ENCOUNTER — Ambulatory Visit (INDEPENDENT_AMBULATORY_CARE_PROVIDER_SITE_OTHER)
Admission: RE | Admit: 2013-07-04 | Discharge: 2013-07-04 | Disposition: A | Discharge status: Home / Self Care | Payer: Medicare Other | Source: Ambulatory Visit | Attending: Family Medicine | Admitting: Family Medicine

## 2013-07-04 ENCOUNTER — Ambulatory Visit (INDEPENDENT_AMBULATORY_CARE_PROVIDER_SITE_OTHER): Payer: Medicare Other | Admitting: Family Medicine

## 2013-07-04 ENCOUNTER — Encounter: Payer: Self-pay | Admitting: Family Medicine

## 2013-07-04 VITALS — BP 128/84 | HR 72 | Temp 97.7°F | Ht 64.5 in | Wt 119.8 lb

## 2013-07-04 DIAGNOSIS — E7800 Pure hypercholesterolemia, unspecified: Secondary | ICD-10-CM

## 2013-07-04 DIAGNOSIS — M79609 Pain in unspecified limb: Secondary | ICD-10-CM

## 2013-07-04 DIAGNOSIS — M79672 Pain in left foot: Secondary | ICD-10-CM

## 2013-07-04 DIAGNOSIS — G479 Sleep disorder, unspecified: Secondary | ICD-10-CM

## 2013-07-04 DIAGNOSIS — E039 Hypothyroidism, unspecified: Secondary | ICD-10-CM

## 2013-07-04 DIAGNOSIS — I10 Essential (primary) hypertension: Secondary | ICD-10-CM

## 2013-07-04 DIAGNOSIS — Z23 Encounter for immunization: Secondary | ICD-10-CM

## 2013-07-04 LAB — LIPID PANEL
Cholesterol: 223 mg/dL — ABNORMAL HIGH (ref 0–200)
HDL: 72.8 mg/dL (ref >39.00)
Total CHOL/HDL Ratio: 3
Triglycerides: 68 mg/dL (ref 0.0–149.0)
VLDL: 13.6 mg/dL (ref 0.0–40.0)

## 2013-07-04 LAB — COMPREHENSIVE METABOLIC PANEL
ALT: 10 U/L (ref 0–35)
AST: 17 U/L (ref 0–37)
Albumin: 3.8 g/dL (ref 3.5–5.2)
Alkaline Phosphatase: 64 U/L (ref 39–117)
BUN: 10 mg/dL (ref 6–23)
CO2: 30 mEq/L (ref 19–32)
Calcium: 9.1 mg/dL (ref 8.4–10.5)
Chloride: 103 mEq/L (ref 96–112)
Creatinine, Ser: 0.6 mg/dL (ref 0.4–1.2)
GFR: 103.73 mL/min (ref >60.00)
Glucose, Bld: 87 mg/dL (ref 70–99)
Potassium: 4.3 mEq/L (ref 3.5–5.1)
Sodium: 140 mEq/L (ref 135–145)
Total Bilirubin: 0.7 mg/dL (ref 0.3–1.2)
Total Protein: 6.8 g/dL (ref 6.0–8.3)

## 2013-07-04 LAB — LDL CHOLESTEROL, DIRECT: Direct LDL: 140.9 mg/dL

## 2013-07-04 LAB — TSH: TSH: 0.09 u[IU]/mL — ABNORMAL LOW (ref 0.35–5.50)

## 2013-07-04 MED ORDER — LEVOTHYROXINE SODIUM 75 MCG PO TABS: 75.0000 ug | ORAL_TABLET | Freq: Every day | ORAL | Status: DC

## 2013-07-04 MED ORDER — GABAPENTIN 300 MG PO CAPS: ORAL_CAPSULE | ORAL | Status: DC

## 2013-07-04 MED ORDER — PRAVASTATIN SODIUM 40 MG PO TABS: 40.0000 mg | ORAL_TABLET | Freq: Every evening | ORAL | Status: DC

## 2013-07-04 MED ORDER — NORTRIPTYLINE HCL 25 MG PO CAPS: 25.0000 mg | ORAL_CAPSULE | Freq: Every day | ORAL | Status: DC

## 2013-07-04 MED ORDER — ATORVASTATIN CALCIUM 40 MG PO TABS: 40.0000 mg | ORAL_TABLET | Freq: Every day | ORAL | Status: DC

## 2013-07-04 MED ORDER — LISINOPRIL 10 MG PO TABS: 5.0000 mg | ORAL_TABLET | Freq: Every day | ORAL | Status: DC

## 2013-07-04 MED ORDER — ALENDRONATE SODIUM 70 MG PO TABS: 70.0000 mg | ORAL_TABLET | ORAL | Status: DC

## 2013-07-04 MED ORDER — NORTRIPTYLINE HCL 75 MG PO CAPS: 75.0000 mg | ORAL_CAPSULE | Freq: Every day | ORAL | Status: DC

## 2013-07-04 NOTE — Patient Instructions (Signed)
Xray of foot and ankle today  Use cold compress and elevate it when you can Labs today I sent refills to the pharmacy  Follow up in 6 months for annual exam with labs prior

## 2013-07-04 NOTE — Progress Notes (Signed)
Subjective:    Patient ID: [name] [name], female    DOB: 02-21-1946, 67 y.o.   MRN: [id number]  HPI Here for f/u of chronic health conditions  Feeling ok overall   L foot hurts again- a little swollen  Has broken the R foot in the past On fosamax for OP   Wt is down 6 lb She was not aware of that  Appetite comes and goes  In general eats less in the summer -due to the heat  Eats a healthy diet and does not skip meals  bmi is 20  BP Readings from Last 3 Encounters:  07/04/13 128/84  01/08/13 122/82  05/24/12 122/78    Hypothyroid Lab Results  Component Value Date   TSH 0.69 01/08/2013   hxof hashimotos/ goiter/thyroidectomy No clinical changes  Has ups and downs with fatigue given sleep disorder   Hyperlipidemia- due for labs Diet is good  On lipitor-no side eff or problems at all No vasc dz known at this time  Sleep disorder - gets some good nights and some bad nights - on the temazepam Overall -she thinks this is stable for her   Patient Active Problem List   Diagnosis Date Noted  . Sebaceous cyst 01/08/2013  . Chronic foot pain 10/27/2011  . Preoperative examination 04/14/2011  . Low back pain 04/14/2011  . FEVER BLISTER 11/17/2010  . ESSENTIAL HYPERTENSION 08/18/2009  . BUNION 03/04/2009  . ALLERGIC RHINITIS 01/10/2008  . TOBACCO USE, QUIT 02/10/2007  . GOITER, MULTINODULAR 02/09/2007  . HYPOTHYROIDISM 02/09/2007  . HYPERCHOLESTEROLEMIA 02/09/2007  . DEPRESSION 02/09/2007  . OSTEOARTHRITIS 02/09/2007  . OSTEOPOROSIS 02/09/2007  . SLEEP DISORDER 02/09/2007   Past Medical History  Diagnosis Date  . Depression   . Hypothyroidism   . Osteoarthritis   . Osteoporosis   . Tobacco abuse     past; quit 09  . Sleep disorder   . Shingles     arm 1/12   Past Surgical History  Procedure Laterality Date  . Hyerectomy- cervical ca cells    . Left wrist fracture      surgery  . Bunions  06/1998  . Hashimoto  02/1997  . Dexa  12/1996  . Biopsy thyroid       pos neoplasm, surgery 09/2006  . Cxr-copd, ts partial compression fracture  12/2006  . Right thyroid lobectomy, goiter, thyroiditis  12/2006  . Thyroidectomy  11/2009  . Stress fractures foot     History  Substance Use Topics  . Smoking status: Former Games developer  . Smokeless tobacco: Never Used     Comment: Quit 10/09  . Alcohol Use: No   Family History  Problem Relation Age of Onset  . Diabetes Mother   . Coronary artery disease Mother   . Stroke Father   . Hypertension Father    No Known Allergies Current Outpatient Prescriptions on File Prior to Visit  Medication Sig Dispense Refill  . alendronate (FOSAMAX) 70 MG tablet Take 70 mg by mouth every 7 (seven) days. Take with a full glass of water on an empty stomach.       Marland Kitchen atorvastatin (LIPITOR) 40 MG tablet Take 1 tablet (40 mg total) by mouth daily. Please schedule appointment for a physical, and have fasting labwork done prior.  30 tablet  1  . Calcium Carbonate-Vit D-Min (CALCIUM 1200 PO) Take by mouth daily.        . fluticasone (VERAMYST) 27.5 MCG/SPRAY nasal spray Place 2 sprays into the nose  daily.        . gabapentin (NEURONTIN) 300 MG capsule Take 300 mg by mouth. 1 by mouth at bedtiem can take 1 in am if needed for painful rash this will cause sleepiness       . ibuprofen (ADVIL,MOTRIN) 800 MG tablet Take one by mouth three times a day for 5 days       . levothyroxine (SYNTHROID, LEVOTHROID) 75 MCG tablet Take 1 tablet (75 mcg total) by mouth daily.  30 tablet  6  . lisinopril (ZESTRIL) 10 MG tablet Take 0.5 tablets (5 mg total) by mouth daily.  30 tablet  3  . nortriptyline (PAMELOR) 25 MG capsule Take 1 capsule (25 mg total) by mouth at bedtime. w 75 mg=with her 75 mg capsule also  30 capsule  11  . nortriptyline (PAMELOR) 75 MG capsule Take 1 capsule (75 mg total) by mouth at bedtime.  30 capsule  5  . oxyCODONE-acetaminophen (PERCOCET) 5-325 MG per tablet Take 1 tablet by mouth every 6 (six) hours as needed. Times 5 days        . pravastatin (PRAVACHOL) 40 MG tablet Take 1 tablet (40 mg total) by mouth every evening.  30 tablet  3  . temazepam (RESTORIL) 30 MG capsule Take 1 capsule (30 mg total) by mouth at bedtime.  30 capsule  5  . valACYclovir (VALTREX) 1000 MG tablet Take 2 pills by mouth twice daily for one day as needed for cold sore  4 tablet  5   No current facility-administered medications on file prior to visit.    Review of Systems Review of Systems  Constitutional: Negative for fever, appetite change, fatigue and unexpected weight change.  Eyes: Negative for pain and visual disturbance.  Respiratory: Negative for cough and shortness of breath.   Cardiovascular: Negative for cp or palpitations    Gastrointestinal: Negative for nausea, diarrhea and constipation.  Genitourinary: Negative for urgency and frequency.  Skin: Negative for pallor or rash   MSK pos for L foot pain with swelling of foot and ankle/ neg for trauma Neurological: Negative for weakness, light-headedness, numbness and headaches.  Hematological: Negative for adenopathy. Does not bruise/bleed easily.  Psychiatric/Behavioral: Negative for dysphoric mood. The patient is not nervous/anxious.         Objective:   Physical Exam  Constitutional: She appears well-developed and well-nourished. No distress.  Slim and well app  HENT:  Head: Normocephalic and atraumatic.  Right Ear: External ear normal.  Left Ear: External ear normal.  Mouth/Throat: Oropharynx is clear and moist.  Eyes: Conjunctivae and EOM are normal. Pupils are equal, round, and reactive to light. No scleral icterus.  Neck: Normal range of motion. Neck supple. No JVD present. Carotid bruit is not present. No thyromegaly present.  S/p thyroidectomy-nothing to palpate   Cardiovascular: Normal rate, regular rhythm, normal heart sounds and intact distal pulses.  Exam reveals no gallop.   Pulmonary/Chest: Effort normal and breath sounds normal. No respiratory distress.  She has no wheezes. She exhibits no tenderness.  Abdominal: Soft. Bowel sounds are normal. She exhibits no distension, no abdominal bruit and no mass. There is no tenderness.  Genitourinary: No breast swelling, tenderness, discharge or bleeding.  Musculoskeletal: Normal range of motion. She exhibits edema and tenderness.  Dorsal L foot- some tenderness over prominence on first metatarsal Scar from old foot surgery Some swelling over both malleoli-nontender Pain to invert/ evert ankle Nl gait  Neg tarsal tilt  Lymphadenopathy:    She  has no cervical adenopathy.  Neurological: She is alert. She has normal reflexes. No cranial nerve deficit. She exhibits normal muscle tone. Coordination normal.  No tremor  Skin: Skin is warm and dry. No rash noted. No erythema. No pallor.  Psychiatric: She has a normal mood and affect.          Assessment & Plan:

## 2013-07-05 ENCOUNTER — Telehealth: Payer: Self-pay | Admitting: Family Medicine

## 2013-07-05 MED ORDER — LEVOTHYROXINE SODIUM 50 MCG PO TABS: 50.0000 ug | ORAL_TABLET | Freq: Every day | ORAL | Status: DC

## 2013-07-05 NOTE — Telephone Encounter (Signed)
Stable chol but we need to dec thyroid med  Please cancel last order with pharmacy and I will send lower dose  Re check tsh 6 wk please for hypothyroid

## 2013-07-05 NOTE — Telephone Encounter (Signed)
Pt notified of labs and that new dose of synthroid sent to pharmacy, I called and cancelled the old dose of synthroid, lab appt scheduled for 08/20/13 to recheck TSH

## 2013-07-05 NOTE — Assessment & Plan Note (Signed)
Temazepam works most nights Rev cautions with this - re: habit or fall risk

## 2013-07-05 NOTE — Assessment & Plan Note (Signed)
bp in fair control at this time  No changes needed  Disc lifstyle change with low sodium diet and exercise  Med refilled  Lab today

## 2013-07-05 NOTE — Assessment & Plan Note (Signed)
Lab today pravachol and diet Disc goals for lipids and reasons to control them Rev low sat fat diet in detail

## 2013-07-05 NOTE — Assessment & Plan Note (Signed)
No clinical change except wt loss Lab today

## 2013-07-05 NOTE — Assessment & Plan Note (Signed)
Hx of OP Hx of stress fx -other foot  Xray today Ice/elevation

## 2013-08-10 ENCOUNTER — Other Ambulatory Visit: Payer: Self-pay | Admitting: Family Medicine

## 2013-08-10 DIAGNOSIS — E039 Hypothyroidism, unspecified: Secondary | ICD-10-CM

## 2013-08-20 ENCOUNTER — Other Ambulatory Visit (INDEPENDENT_AMBULATORY_CARE_PROVIDER_SITE_OTHER): Payer: Medicare Other

## 2013-08-20 DIAGNOSIS — E039 Hypothyroidism, unspecified: Secondary | ICD-10-CM

## 2013-08-20 LAB — TSH: TSH: 9.97 u[IU]/mL — ABNORMAL HIGH (ref 0.35–5.50)

## 2013-08-21 ENCOUNTER — Telehealth: Payer: Self-pay | Admitting: Family Medicine

## 2013-08-21 DIAGNOSIS — E039 Hypothyroidism, unspecified: Secondary | ICD-10-CM

## 2013-08-21 MED ORDER — LEVOTHYROXINE SODIUM 75 MCG PO TABS: 75.0000 ug | ORAL_TABLET | Freq: Every day | ORAL | Status: DC

## 2013-08-21 NOTE — Telephone Encounter (Signed)
tsh is high - if she has not missed any doses of med we need to inc it from 50 to 75 mcg  Please call it in  Schedule lab in 4 wk for tsh please

## 2013-08-22 MED ORDER — LEVOTHYROXINE SODIUM 75 MCG PO TABS: 75.0000 ug | ORAL_TABLET | Freq: Every day | ORAL | Status: DC

## 2013-08-22 NOTE — Telephone Encounter (Signed)
Pt has not missed any doses of medication so Rx sent to pharmacy. Lab appt scheduled for 09/20/12

## 2013-09-18 ENCOUNTER — Other Ambulatory Visit: Payer: Self-pay | Admitting: Family Medicine

## 2013-09-18 MED ORDER — ATORVASTATIN CALCIUM 40 MG PO TABS: 40.0000 mg | ORAL_TABLET | Freq: Every day | ORAL | Status: DC

## 2013-09-18 MED ORDER — LISINOPRIL 10 MG PO TABS: 5.0000 mg | ORAL_TABLET | Freq: Every day | ORAL | Status: DC

## 2013-09-18 MED ORDER — NORTRIPTYLINE HCL 25 MG PO CAPS: 25.0000 mg | ORAL_CAPSULE | Freq: Every day | ORAL | Status: DC

## 2013-09-18 MED ORDER — LEVOTHYROXINE SODIUM 75 MCG PO TABS: 75.0000 ug | ORAL_TABLET | Freq: Every day | ORAL | Status: DC

## 2013-09-18 MED ORDER — NORTRIPTYLINE HCL 75 MG PO CAPS: 75.0000 mg | ORAL_CAPSULE | Freq: Every day | ORAL | Status: DC

## 2013-09-18 MED ORDER — TEMAZEPAM 30 MG PO CAPS: 30.0000 mg | ORAL_CAPSULE | Freq: Every day | ORAL | Status: DC

## 2013-09-18 NOTE — Telephone Encounter (Signed)
Px written for call in  For restoril Rest send elect

## 2013-09-18 NOTE — Telephone Encounter (Signed)
Received fax from mail order pharmacy saying they need new Rxs and the Temazepam has to be a paper Rx faxed to them, please advise

## 2013-09-18 NOTE — Telephone Encounter (Signed)
Medication phoned to pharmacy.  

## 2013-09-19 ENCOUNTER — Other Ambulatory Visit: Payer: Self-pay

## 2013-09-19 ENCOUNTER — Encounter: Payer: Self-pay | Admitting: Family Medicine

## 2013-09-19 ENCOUNTER — Telehealth: Payer: Self-pay | Admitting: Family Medicine

## 2013-09-19 MED ORDER — TEMAZEPAM 30 MG PO CAPS: 30.0000 mg | ORAL_CAPSULE | Freq: Every day | ORAL | Status: DC

## 2013-09-19 NOTE — Telephone Encounter (Signed)
Med refill

## 2013-09-19 NOTE — Telephone Encounter (Signed)
90 day Rx for Temazepam faxed to pharmacy--New mail order

## 2013-09-20 ENCOUNTER — Other Ambulatory Visit: Payer: Medicare Other

## 2013-09-21 ENCOUNTER — Other Ambulatory Visit (INDEPENDENT_AMBULATORY_CARE_PROVIDER_SITE_OTHER): Payer: Medicare HMO

## 2013-09-21 DIAGNOSIS — E039 Hypothyroidism, unspecified: Secondary | ICD-10-CM

## 2013-09-21 LAB — TSH: TSH: 3.29 u[IU]/mL (ref 0.35–5.50)

## 2013-09-24 ENCOUNTER — Encounter: Payer: Self-pay | Admitting: *Deleted

## 2013-10-01 ENCOUNTER — Telehealth: Payer: Self-pay | Admitting: Family Medicine

## 2013-10-01 NOTE — Telephone Encounter (Signed)
This encounter was created in error - please disregard.

## 2013-10-01 NOTE — Telephone Encounter (Signed)
Pt requested that we fax her med list to [facility] per their request so that they will pay for her medications.  Fax# [phone].

## 2013-10-11 ENCOUNTER — Telehealth: Payer: Self-pay

## 2013-10-11 NOTE — Telephone Encounter (Signed)
Pt wanted to verify [facility] was listed as her mail order pharmacy. Pt advised pharmacy already updated and pt will cb when needs refill of meds.

## 2013-10-24 ENCOUNTER — Telehealth: Payer: Self-pay

## 2013-10-24 NOTE — Telephone Encounter (Signed)
Pt request refill atorvastatin and pamelor; pt will call [facility] for refill; pt said she is changing pharmacies to [facility]; pt is out of med now; pt will get refill from [facility] this time and next time pt will request refill from [facility].

## 2013-12-11 ENCOUNTER — Emergency Department: Payer: Self-pay | Admitting: Emergency Medicine

## 2013-12-11 LAB — BASIC METABOLIC PANEL
Anion Gap: 3 — ABNORMAL LOW (ref 7–16)
BUN: 11 mg/dL (ref 7–18)
Calcium, Total: 8.6 mg/dL (ref 8.5–10.1)
Chloride: 107 mmol/L (ref 98–107)
Co2: 30 mmol/L (ref 21–32)
Creatinine: 0.78 mg/dL (ref 0.60–1.30)
EGFR (African American): >60
EGFR (Non-African Amer.): >60
Glucose: 111 mg/dL — ABNORMAL HIGH (ref 65–99)
Osmolality: 279 (ref 275–301)
Potassium: 3.4 mmol/L — ABNORMAL LOW (ref 3.5–5.1)
Sodium: 140 mmol/L (ref 136–145)

## 2013-12-11 LAB — TROPONIN I: Troponin-I: <0.02 ng/mL

## 2013-12-11 LAB — CBC
HCT: 40.2 % (ref 35.0–47.0)
HGB: 13.6 g/dL (ref 12.0–16.0)
MCH: 31.6 pg (ref 26.0–34.0)
MCHC: 34 g/dL (ref 32.0–36.0)
MCV: 93 fL (ref 80–100)
Platelet: 222 10*3/uL (ref 150–440)
RBC: 4.32 10*6/uL (ref 3.80–5.20)
RDW: 13.9 % (ref 11.5–14.5)
WBC: 6 10*3/uL (ref 3.6–11.0)

## 2013-12-13 ENCOUNTER — Emergency Department: Payer: Self-pay | Admitting: Emergency Medicine

## 2013-12-13 LAB — BASIC METABOLIC PANEL
Anion Gap: 5 — ABNORMAL LOW (ref 7–16)
BUN: 12 mg/dL (ref 7–18)
Calcium, Total: 8.6 mg/dL (ref 8.5–10.1)
Chloride: 104 mmol/L (ref 98–107)
Co2: 29 mmol/L (ref 21–32)
Creatinine: 0.72 mg/dL (ref 0.60–1.30)
EGFR (African American): >60
EGFR (Non-African Amer.): >60
Glucose: 136 mg/dL — ABNORMAL HIGH (ref 65–99)
Osmolality: 278 (ref 275–301)
Potassium: 3.6 mmol/L (ref 3.5–5.1)
Sodium: 138 mmol/L (ref 136–145)

## 2013-12-13 LAB — CBC
HCT: 42.2 % (ref 35.0–47.0)
HGB: 14.2 g/dL (ref 12.0–16.0)
MCH: 31.2 pg (ref 26.0–34.0)
MCHC: 33.5 g/dL (ref 32.0–36.0)
MCV: 93 fL (ref 80–100)
Platelet: 222 10*3/uL (ref 150–440)
RBC: 4.53 10*6/uL (ref 3.80–5.20)
RDW: 14.2 % (ref 11.5–14.5)
WBC: 7 10*3/uL (ref 3.6–11.0)

## 2013-12-13 LAB — TROPONIN I: Troponin-I: <0.02 ng/mL

## 2013-12-17 ENCOUNTER — Ambulatory Visit (INDEPENDENT_AMBULATORY_CARE_PROVIDER_SITE_OTHER): Payer: Medicare HMO | Admitting: Internal Medicine

## 2013-12-17 ENCOUNTER — Encounter: Payer: Self-pay | Admitting: Internal Medicine

## 2013-12-17 ENCOUNTER — Ambulatory Visit (INDEPENDENT_AMBULATORY_CARE_PROVIDER_SITE_OTHER)
Admission: RE | Admit: 2013-12-17 | Discharge: 2013-12-17 | Disposition: A | Discharge status: Home / Self Care | Payer: Medicare HMO | Source: Ambulatory Visit | Attending: Internal Medicine | Admitting: Internal Medicine

## 2013-12-17 VITALS — BP 106/70 | HR 76 | Temp 97.9°F | Wt 117.2 lb

## 2013-12-17 DIAGNOSIS — S2220XA Unspecified fracture of sternum, initial encounter for closed fracture: Secondary | ICD-10-CM

## 2013-12-17 DIAGNOSIS — R0602 Shortness of breath: Secondary | ICD-10-CM
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
## 2013-12-17 NOTE — Progress Notes (Signed)
Pre visit review using our clinic review tool, if applicable. No additional management support is needed unless otherwise documented below in the visit note. 

## 2013-12-25 ENCOUNTER — Telehealth: Payer: Self-pay | Admitting: Family Medicine

## 2013-12-25 DIAGNOSIS — E039 Hypothyroidism, unspecified: Secondary | ICD-10-CM

## 2013-12-25 DIAGNOSIS — I10 Essential (primary) hypertension: Secondary | ICD-10-CM

## 2013-12-25 DIAGNOSIS — E7800 Pure hypercholesterolemia, unspecified: Secondary | ICD-10-CM

## 2013-12-25 NOTE — Telephone Encounter (Signed)
Message copied by Abner Greenspan on Tue Dec 25, 2013 10:31 PM ------      Message from: Ellamae Sia      Created: Wed Dec 19, 2013  6:15 PM      Regarding: Lab orders for Wednesday, 4.22.15       Lab orders for a 6 month f/u ------

## 2013-12-26 ENCOUNTER — Other Ambulatory Visit (INDEPENDENT_AMBULATORY_CARE_PROVIDER_SITE_OTHER): Payer: Medicare HMO

## 2013-12-26 DIAGNOSIS — I10 Essential (primary) hypertension: Secondary | ICD-10-CM

## 2013-12-26 DIAGNOSIS — E7800 Pure hypercholesterolemia, unspecified: Secondary | ICD-10-CM

## 2013-12-26 DIAGNOSIS — E039 Hypothyroidism, unspecified: Secondary | ICD-10-CM

## 2013-12-26 LAB — LIPID PANEL
Cholesterol: 233 mg/dL — ABNORMAL HIGH (ref 0–200)
HDL: 81.1 mg/dL (ref >39.00)
LDL Cholesterol: 139 mg/dL — ABNORMAL HIGH (ref 0–99)
Total CHOL/HDL Ratio: 3
Triglycerides: 64 mg/dL (ref 0.0–149.0)
VLDL: 12.8 mg/dL (ref 0.0–40.0)

## 2013-12-26 LAB — COMPREHENSIVE METABOLIC PANEL
ALT: 14 U/L (ref 0–35)
AST: 22 U/L (ref 0–37)
Albumin: 3.7 g/dL (ref 3.5–5.2)
Alkaline Phosphatase: 76 U/L (ref 39–117)
BUN: 8 mg/dL (ref 6–23)
CO2: 29 mEq/L (ref 19–32)
Calcium: 9.2 mg/dL (ref 8.4–10.5)
Chloride: 105 mEq/L (ref 96–112)
Creatinine, Ser: 0.7 mg/dL (ref 0.4–1.2)
GFR: 94.58 mL/min (ref >60.00)
Glucose, Bld: 104 mg/dL — ABNORMAL HIGH (ref 70–99)
Potassium: 3.7 mEq/L (ref 3.5–5.1)
Sodium: 142 mEq/L (ref 135–145)
Total Bilirubin: 0.4 mg/dL (ref 0.3–1.2)
Total Protein: 7 g/dL (ref 6.0–8.3)

## 2013-12-26 LAB — TSH: TSH: 2.12 u[IU]/mL (ref 0.35–5.50)

## 2014-01-02 ENCOUNTER — Telehealth: Payer: Self-pay | Admitting: Family Medicine

## 2014-01-02 ENCOUNTER — Ambulatory Visit (INDEPENDENT_AMBULATORY_CARE_PROVIDER_SITE_OTHER): Payer: Medicare HMO | Admitting: Family Medicine

## 2014-01-02 ENCOUNTER — Encounter: Payer: Self-pay | Admitting: Family Medicine

## 2014-01-02 VITALS — BP 140/88 | HR 81 | Temp 98.5°F | Ht 64.5 in | Wt 118.2 lb

## 2014-01-02 DIAGNOSIS — B009 Herpesviral infection, unspecified: Secondary | ICD-10-CM

## 2014-01-02 DIAGNOSIS — E7800 Pure hypercholesterolemia, unspecified: Secondary | ICD-10-CM

## 2014-01-02 DIAGNOSIS — I10 Essential (primary) hypertension: Secondary | ICD-10-CM

## 2014-01-02 DIAGNOSIS — E039 Hypothyroidism, unspecified: Secondary | ICD-10-CM

## 2014-01-02 MED ORDER — FLUTICASONE FUROATE 27.5 MCG/SPRAY NA SUSP: 2.0000 | Freq: Every day | NASAL | Status: DC

## 2014-01-02 MED ORDER — ALENDRONATE SODIUM 70 MG PO TABS: 70.0000 mg | ORAL_TABLET | ORAL | Status: DC

## 2014-01-02 MED ORDER — VALACYCLOVIR HCL 1 G PO TABS: ORAL_TABLET | ORAL | Status: DC

## 2014-01-02 MED ORDER — GABAPENTIN 300 MG PO CAPS: ORAL_CAPSULE | ORAL | Status: DC

## 2014-01-02 MED ORDER — NORTRIPTYLINE HCL 25 MG PO CAPS: 25.0000 mg | ORAL_CAPSULE | Freq: Every day | ORAL | Status: DC

## 2014-01-02 MED ORDER — ATORVASTATIN CALCIUM 40 MG PO TABS: 40.0000 mg | ORAL_TABLET | Freq: Every day | ORAL | Status: DC

## 2014-01-02 MED ORDER — TEMAZEPAM 30 MG PO CAPS: 30.0000 mg | ORAL_CAPSULE | Freq: Every day | ORAL | Status: DC

## 2014-01-02 MED ORDER — LISINOPRIL 10 MG PO TABS: 5.0000 mg | ORAL_TABLET | Freq: Every day | ORAL | Status: DC

## 2014-01-02 MED ORDER — NORTRIPTYLINE HCL 75 MG PO CAPS: 75.0000 mg | ORAL_CAPSULE | Freq: Every day | ORAL | Status: DC

## 2014-01-02 MED ORDER — LEVOTHYROXINE SODIUM 75 MCG PO TABS: 75.0000 ug | ORAL_TABLET | Freq: Every day | ORAL | Status: DC

## 2014-01-02 MED ORDER — MUPIROCIN 2 % EX OINT: TOPICAL_OINTMENT | CUTANEOUS | Status: DC

## 2014-01-02 MED ORDER — PRAVASTATIN SODIUM 40 MG PO TABS: 40.0000 mg | ORAL_TABLET | Freq: Every evening | ORAL | Status: DC
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
## 2014-01-02 NOTE — Patient Instructions (Signed)
Blood pressure was better on 2nd check  Labs are stable  I'm glad your pain is getting better  Take care of yourself   Follow up for an annual exam in 6 months with labs prior

## 2014-01-02 NOTE — Progress Notes (Signed)
Pre visit review using our clinic review tool, if applicable. No additional management support is needed unless otherwise documented below in the visit note. 

## 2014-01-02 NOTE — Telephone Encounter (Signed)
Relevant patient education assigned to patient using Emmi. ° °

## 2014-01-03 NOTE — Assessment & Plan Note (Signed)
Healing on L lower lip Renewed px for bactroban and valtrex for prn use

## 2014-01-03 NOTE — Assessment & Plan Note (Signed)
Disc goals for lipids and reasons to control them Rev labs with pt Rev low sat fat diet in detail   

## 2014-01-03 NOTE — Assessment & Plan Note (Signed)
bp in fair control at this time  BP Readings from Last 1 Encounters:  01/02/14 140/88  improved on 2nd check  No changes needed Disc lifstyle change with low sodium diet and exercise

## 2014-01-03 NOTE — Assessment & Plan Note (Signed)
Hypothyroidism  Pt has no clinical changes No change in energy level/ hair or skin/ edema and no tremor Lab Results  Component Value Date   TSH 2.12 12/26/2013

## 2014-01-08 ENCOUNTER — Telehealth: Payer: Self-pay | Admitting: *Deleted

## 2014-01-08 NOTE — Telephone Encounter (Signed)
Pt is taking atorvastatin, Pravastatin removed from med list and pharmacy notified

## 2014-01-08 NOTE — Telephone Encounter (Signed)
Please verify with pt -I think she is on pravastatin - if that is the case please remove the atorvastatin from the list

## 2014-01-08 NOTE — Telephone Encounter (Signed)
Pharmacy received an rx for both Pravastatin 40mg , and Atorvastatin 40mg  on 01/02/14. They wanted to verify if pt is taking, and if they are to fill both meds?  Please advise.? Fax form is in your inbox.

## 2014-01-14 ENCOUNTER — Ambulatory Visit (INDEPENDENT_AMBULATORY_CARE_PROVIDER_SITE_OTHER): Payer: Medicare HMO | Admitting: Family Medicine

## 2014-01-14 ENCOUNTER — Encounter: Payer: Self-pay | Admitting: Family Medicine

## 2014-01-14 VITALS — BP 142/84 | HR 84 | Temp 98.7°F | Ht 64.5 in | Wt 118.0 lb

## 2014-01-14 DIAGNOSIS — B9789 Other viral agents as the cause of diseases classified elsewhere: Principal | ICD-10-CM

## 2014-01-14 DIAGNOSIS — J069 Acute upper respiratory infection, unspecified: Secondary | ICD-10-CM

## 2014-01-14 MED ORDER — CEFUROXIME AXETIL 500 MG PO TABS: 500.0000 mg | ORAL_TABLET | Freq: Two times a day (BID) | ORAL | Status: DC

## 2014-01-14 MED ORDER — ALBUTEROL SULFATE HFA 108 (90 BASE) MCG/ACT IN AERS: 2.0000 | INHALATION_SPRAY | RESPIRATORY_TRACT | Status: DC | PRN

## 2014-01-14 NOTE — Assessment & Plan Note (Signed)
Disc symptomatic care - see instructions on AVS  Update if not starting to improve in a week or if worsening  If worse will fill px for ceftin and update  Albuterol mdi sent to pharmacy

## 2014-01-14 NOTE — Progress Notes (Signed)
Pre visit review using our clinic review tool, if applicable. No additional management support is needed unless otherwise documented below in the visit note. 

## 2014-01-14 NOTE — Progress Notes (Signed)
Subjective:    Patient ID: [name] [name], female    DOB: 1945/11/28, 68 y.o.   MRN: [id number]  HPI Here with uri symptoms since Friday  achey all over  Headache-whole head /not just face  Feels congestion in chest -nothing coming up yet Runny nose all day yesterday-yellow mucous  Some sneezing   ? If fever  Had some chills -at night   Throat is sore- when she coughs Ears are ok   No otc med   Patient Active Problem List   Diagnosis Date Noted  . Left foot pain 07/04/2013  . Sebaceous cyst 01/08/2013  . Chronic foot pain 10/27/2011  . Preoperative examination 04/14/2011  . Low back pain 04/14/2011  . FEVER BLISTER 11/17/2010  . ESSENTIAL HYPERTENSION 08/18/2009  . BUNION 03/04/2009  . ALLERGIC RHINITIS 01/10/2008  . TOBACCO USE, QUIT 02/10/2007  . GOITER, MULTINODULAR 02/09/2007  . HYPOTHYROIDISM 02/09/2007  . HYPERCHOLESTEROLEMIA 02/09/2007  . DEPRESSION 02/09/2007  . OSTEOARTHRITIS 02/09/2007  . OSTEOPOROSIS 02/09/2007  . SLEEP DISORDER 02/09/2007   Past Medical History  Diagnosis Date  . Depression   . Hypothyroidism   . Osteoarthritis   . Osteoporosis   . Tobacco abuse     past; quit 09  . Sleep disorder   . Shingles     arm 1/12   Past Surgical History  Procedure Laterality Date  . Hyerectomy- cervical ca cells    . Left wrist fracture      surgery  . Bunions  06/1998  . Hashimoto  02/1997  . Dexa  12/1996  . Biopsy thyroid      pos neoplasm, surgery 09/2006  . Cxr-copd, ts partial compression fracture  12/2006  . Right thyroid lobectomy, goiter, thyroiditis  12/2006  . Thyroidectomy  11/2009  . Stress fractures foot     History  Substance Use Topics  . Smoking status: Former Research scientist (life sciences)  . Smokeless tobacco: Never Used     Comment: Quit 10/09  . Alcohol Use: No   Family History  Problem Relation Age of Onset  . Diabetes Mother   . Coronary artery disease Mother   . Stroke Father   . Hypertension Father    No Known Allergies Current  Outpatient Prescriptions on File Prior to Visit  Medication Sig Dispense Refill  . alendronate (FOSAMAX) 70 MG tablet Take 1 tablet (70 mg total) by mouth every 7 (seven) days. Take with a full glass of water on an empty stomach.  12 tablet  3  . atorvastatin (LIPITOR) 40 MG tablet Take 1 tablet (40 mg total) by mouth daily.  90 tablet  3  . Calcium Carbonate-Vit D-Min (CALCIUM 1200 PO) Take by mouth daily.        . fluticasone (VERAMYST) 27.5 MCG/SPRAY nasal spray Place 2 sprays into the nose daily.  30 g  3  . gabapentin (NEURONTIN) 300 MG capsule 1 by mouth at bedtiem can take 1 in am if needed for painful rash this will cause sleepiness  90 capsule  3  . ibuprofen (ADVIL,MOTRIN) 800 MG tablet Take one by mouth three times a day for 5 days       . levothyroxine (SYNTHROID, LEVOTHROID) 75 MCG tablet Take 1 tablet (75 mcg total) by mouth daily.  90 tablet  3  . lisinopril (ZESTRIL) 10 MG tablet Take 0.5 tablets (5 mg total) by mouth daily.  90 tablet  3  . mupirocin ointment (BACTROBAN) 2 % Apply to affected area twice daily  15 g  1  . nortriptyline (PAMELOR) 25 MG capsule Take 1 capsule (25 mg total) by mouth at bedtime. w 75 mg=with her 75 mg capsule also  90 capsule  3  . nortriptyline (PAMELOR) 75 MG capsule Take 1 capsule (75 mg total) by mouth at bedtime.  90 capsule  3  . temazepam (RESTORIL) 30 MG capsule Take 1 capsule (30 mg total) by mouth at bedtime.  90 capsule  1  . valACYclovir (VALTREX) 1000 MG tablet Take 2 pills by mouth twice daily for one day as needed for cold sore  4 tablet  5   No current facility-administered medications on file prior to visit.       Review of Systems    Review of Systems  Constitutional: Negative for fever, appetite change, and unexpected weight change.  ENt pos for congestion and rhinorrhea and sinus pressure  Eyes: Negative for pain and visual disturbance.  Respiratory: Negative for  shortness of breath.   Cardiovascular: Negative for cp or  palpitations    Gastrointestinal: Negative for nausea, diarrhea and constipation.  Genitourinary: Negative for urgency and frequency.  Skin: Negative for pallor or rash   Neurological: Negative for weakness, light-headedness, numbness and headaches.  Hematological: Negative for adenopathy. Does not bruise/bleed easily.  Psychiatric/Behavioral: Negative for dysphoric mood. The patient is not nervous/anxious.      Objective:   Physical Exam  Constitutional: She appears well-developed and well-nourished.  HENT:  Head: Normocephalic and atraumatic.  Right Ear: External ear normal.  Left Ear: External ear normal.  Mouth/Throat: Oropharynx is clear and moist. No oropharyngeal exudate.  Nares are injected and congested    Mild maxillary sinus tenderness  Eyes: Conjunctivae are normal. Pupils are equal, round, and reactive to light.  Neck: Normal range of motion.  Cardiovascular: Normal rate and regular rhythm.   Pulmonary/Chest: Effort normal and breath sounds normal. No respiratory distress. She has no wheezes. She has no rales.  Diffusely distant bs   Neurological: She is alert.  Skin: Skin is warm and dry. No rash noted.  Psychiatric: She has a normal mood and affect.          Assessment & Plan:

## 2014-01-14 NOTE — Patient Instructions (Addendum)
I think you have a viral head and chest cold  Drink lots of fluids and rest  Try mucinex DM for cough and chest congestion- try to cough out anything you need to  For runny nose-try zyrtec 10 mg over the counter For wheezing use then inhaler- but let me know (I sent to the pharmacy)  If you get worse - headache/ fever/ cough - fill the px for ceftin and take it all  Update if not starting to improve in a week or if worsening

## 2014-01-23 ENCOUNTER — Telehealth: Payer: Self-pay | Admitting: Family Medicine

## 2014-01-23 NOTE — Telephone Encounter (Signed)
PA approval letter received for pt's valacyclovir, form faxed to pharmacy and placed in your inbox for signing and scanning into chart

## 2014-02-05 ENCOUNTER — Ambulatory Visit (INDEPENDENT_AMBULATORY_CARE_PROVIDER_SITE_OTHER): Payer: Commercial Managed Care - HMO | Admitting: Family Medicine

## 2014-02-05 ENCOUNTER — Ambulatory Visit (INDEPENDENT_AMBULATORY_CARE_PROVIDER_SITE_OTHER)
Admission: RE | Admit: 2014-02-05 | Discharge: 2014-02-05 | Disposition: A | Discharge status: Home / Self Care | Payer: Commercial Managed Care - HMO | Source: Ambulatory Visit | Attending: Family Medicine | Admitting: Family Medicine

## 2014-02-05 ENCOUNTER — Encounter: Payer: Self-pay | Admitting: Family Medicine

## 2014-02-05 VITALS — BP 138/90 | HR 78 | Temp 98.3°F | Ht 64.5 in | Wt 119.2 lb

## 2014-02-05 DIAGNOSIS — M79609 Pain in unspecified limb: Secondary | ICD-10-CM

## 2014-02-05 DIAGNOSIS — M79672 Pain in left foot: Secondary | ICD-10-CM

## 2014-02-05 MED ORDER — MELOXICAM 15 MG PO TABS: 15.0000 mg | ORAL_TABLET | Freq: Every day | ORAL | Status: DC

## 2014-02-05 NOTE — Progress Notes (Signed)
Pre visit review using our clinic review tool, if applicable. No additional management support is needed unless otherwise documented below in the visit note. 

## 2014-02-05 NOTE — Patient Instructions (Signed)
Foot xray today  Elevate it whenever you can and try cold compress ( like a bag of frozen peas or ice pack) whenever you can  Try meloxicam (I sent it to [facility]) one pill daily with food

## 2014-02-05 NOTE — Progress Notes (Signed)
Subjective:    Patient ID: [name] [name], female    DOB: Nov 11, 1945, 68 y.o.   MRN: [id number]  HPI L foot has been hurting  More and more  Pain is in the arch of the foot/occ in the top of foot  Has stayed swollen since her surgery as well   xrays of foot and ankle in Oct - post surgical changes/nothing new No new injuries  Stress fracture in the past  First metatarsal had surgery in the past -bunion  Patient Active Problem List   Diagnosis Date Noted  . Viral URI with cough 01/14/2014  . Left foot pain 07/04/2013  . Sebaceous cyst 01/08/2013  . Chronic foot pain 10/27/2011  . Preoperative examination 04/14/2011  . Low back pain 04/14/2011  . FEVER BLISTER 11/17/2010  . ESSENTIAL HYPERTENSION 08/18/2009  . BUNION 03/04/2009  . ALLERGIC RHINITIS 01/10/2008  . TOBACCO USE, QUIT 02/10/2007  . GOITER, MULTINODULAR 02/09/2007  . HYPOTHYROIDISM 02/09/2007  . HYPERCHOLESTEROLEMIA 02/09/2007  . DEPRESSION 02/09/2007  . OSTEOARTHRITIS 02/09/2007  . OSTEOPOROSIS 02/09/2007  . SLEEP DISORDER 02/09/2007   Past Medical History  Diagnosis Date  . Depression   . Hypothyroidism   . Osteoarthritis   . Osteoporosis   . Tobacco abuse     past; quit 09  . Sleep disorder   . Shingles     arm 1/12   Past Surgical History  Procedure Laterality Date  . Hyerectomy- cervical ca cells    . Left wrist fracture      surgery  . Bunions  06/1998  . Hashimoto  02/1997  . Dexa  12/1996  . Biopsy thyroid      pos neoplasm, surgery 09/2006  . Cxr-copd, ts partial compression fracture  12/2006  . Right thyroid lobectomy, goiter, thyroiditis  12/2006  . Thyroidectomy  11/2009  . Stress fractures foot     History  Substance Use Topics  . Smoking status: Former Research scientist (life sciences)  . Smokeless tobacco: Never Used     Comment: Quit 10/09  . Alcohol Use: No   Family History  Problem Relation Age of Onset  . Diabetes Mother   . Coronary artery disease Mother   . Stroke Father   . Hypertension Father     No Known Allergies Current Outpatient Prescriptions on File Prior to Visit  Medication Sig Dispense Refill  . albuterol (PROVENTIL HFA;VENTOLIN HFA) 108 (90 BASE) MCG/ACT inhaler Inhale 2 puffs into the lungs every 4 (four) hours as needed for wheezing.  1 Inhaler  3  . alendronate (FOSAMAX) 70 MG tablet Take 1 tablet (70 mg total) by mouth every 7 (seven) days. Take with a full glass of water on an empty stomach.  12 tablet  3  . atorvastatin (LIPITOR) 40 MG tablet Take 1 tablet (40 mg total) by mouth daily.  90 tablet  3  . Calcium Carbonate-Vit D-Min (CALCIUM 1200 PO) Take by mouth daily.        . cefUROXime (CEFTIN) 500 MG tablet Take 1 tablet (500 mg total) by mouth 2 (two) times daily with a meal.  14 tablet  0  . fluticasone (VERAMYST) 27.5 MCG/SPRAY nasal spray Place 2 sprays into the nose daily.  30 g  3  . gabapentin (NEURONTIN) 300 MG capsule 1 by mouth at bedtiem can take 1 in am if needed for painful rash this will cause sleepiness  90 capsule  3  . ibuprofen (ADVIL,MOTRIN) 800 MG tablet Take one by mouth three times  a day for 5 days       . levothyroxine (SYNTHROID, LEVOTHROID) 75 MCG tablet Take 1 tablet (75 mcg total) by mouth daily.  90 tablet  3  . lisinopril (ZESTRIL) 10 MG tablet Take 0.5 tablets (5 mg total) by mouth daily.  90 tablet  3  . mupirocin ointment (BACTROBAN) 2 % Apply to affected area twice daily  15 g  1  . nortriptyline (PAMELOR) 25 MG capsule Take 1 capsule (25 mg total) by mouth at bedtime. w 75 mg=with her 75 mg capsule also  90 capsule  3  . nortriptyline (PAMELOR) 75 MG capsule Take 1 capsule (75 mg total) by mouth at bedtime.  90 capsule  3  . temazepam (RESTORIL) 30 MG capsule Take 1 capsule (30 mg total) by mouth at bedtime.  90 capsule  1  . valACYclovir (VALTREX) 1000 MG tablet Take 2 pills by mouth twice daily for one day as needed for cold sore  4 tablet  5   No current facility-administered medications on file prior to visit.    Review of  Systems Review of Systems  Constitutional: Negative for fever, appetite change, fatigue and unexpected weight change.  Eyes: Negative for pain and visual disturbance.  Respiratory: Negative for cough and shortness of breath.   Cardiovascular: Negative for cp or palpitations    Gastrointestinal: Negative for nausea, diarrhea and constipation.  Genitourinary: Negative for urgency and frequency.  Skin: Negative for pallor or rash   MSK pos for known decreased bone density, pos for L foot pain and swelling  Neurological: Negative for weakness, light-headedness, numbness and headaches.  Hematological: Negative for adenopathy. Does not bruise/bleed easily.  Psychiatric/Behavioral: Negative for dysphoric mood. The patient is not nervous/anxious.         Objective:   Physical Exam  Constitutional: She appears well-developed and well-nourished. No distress.  Slim and well app  HENT:  Head: Atraumatic.  Eyes: Conjunctivae and EOM are normal. Pupils are equal, round, and reactive to light.  Neck: Normal range of motion. Neck supple.  Musculoskeletal: She exhibits edema and tenderness.       Left foot: She exhibits decreased range of motion, tenderness, bony tenderness and swelling. She exhibits no crepitus.  L foot-scar noted on first MT from prev surgery  Tender over heel and instep  Tender over medial metatarsal  Pain to fully dorsiflex ankle (passive or active) No achilles tenderness Mild swelling of forefoot and ankle  Gait is normal   No erythema noted   Neurological: She is alert. She has normal reflexes.  Skin: Skin is warm and dry. No rash noted. No pallor.  Psychiatric: She has a normal mood and affect.          Assessment & Plan:

## 2014-02-06 ENCOUNTER — Telehealth: Payer: Self-pay | Admitting: Family Medicine

## 2014-02-06 DIAGNOSIS — M79672 Pain in left foot: Secondary | ICD-10-CM

## 2014-02-06 NOTE — Assessment & Plan Note (Signed)
Acute on chronic with hx of bunion surgery and old stress fx  Also some findings of plantar fasciitis  Recommend ice and elevation meloxicam prn  Xray today and update

## 2014-02-06 NOTE — Telephone Encounter (Signed)
Message copied by Abner Greenspan on Wed Feb 06, 2014 11:42 AM ------      Message from: Ander Gaster B      Created: Wed Feb 06, 2014  8:48 AM       Results reviewed with pt.  Agrees to podiatry referral. ------

## 2014-02-06 NOTE — Telephone Encounter (Signed)
Referral to podiatry done.

## 2014-02-12 ENCOUNTER — Ambulatory Visit (INDEPENDENT_AMBULATORY_CARE_PROVIDER_SITE_OTHER): Payer: Commercial Managed Care - HMO | Admitting: Podiatry

## 2014-02-12 ENCOUNTER — Ambulatory Visit (INDEPENDENT_AMBULATORY_CARE_PROVIDER_SITE_OTHER): Payer: Commercial Managed Care - HMO

## 2014-02-12 ENCOUNTER — Encounter: Payer: Self-pay | Admitting: Podiatry

## 2014-02-12 VITALS — BP 115/65 | HR 95 | Resp 16

## 2014-02-12 DIAGNOSIS — R609 Edema, unspecified: Secondary | ICD-10-CM

## 2014-02-12 DIAGNOSIS — M8448XA Pathological fracture, other site, initial encounter for fracture: Secondary | ICD-10-CM

## 2014-02-12 DIAGNOSIS — M722 Plantar fascial fibromatosis: Secondary | ICD-10-CM

## 2014-02-12 MED ORDER — TRIAMCINOLONE ACETONIDE 10 MG/ML IJ SUSP
10.0000 mg | Freq: Once | INTRAMUSCULAR | Status: AC
  Administered 2014-02-12: 10 mg

## 2014-02-12 NOTE — Progress Notes (Signed)
Subjective:     Patient ID: [name] [name], female   DOB: Mar 31, 1946, 68 y.o.   MRN: [id number]  Foot Pain   patient presents stating that my left foot has been hurting me very badly for the last month. Started in the heel and alcohol foot hurts and it has become very swollen   Review of Systems  All other systems reviewed and are negative.      Objective:   Physical Exam  Nursing note and vitals reviewed. Cardiovascular: Intact distal pulses.   Musculoskeletal: Normal range of motion.  Neurological: She is alert.  Skin: Skin is warm.   neurovascular status is intact with muscle strength adequate and range of motion of the subtalar and midtarsal joint within normal limits. Patient's found to have a negative Homans sign and is found to have edema in the left foot with pain mostly centralized in the plantar heel     Assessment:     Probable plantar fasciitis creating change in gait leading to inflammation of the rest of the foot with swelling    Plan:     H&P and x-rays reviewed. Injected the left plantar fascia 3 mg Kenalog 5 mg Xylocaine Marcaine mixture and applied Unna boot along with Ace wrap and surgical shoe. Leave on for 3 days and less change in toes should occur were she will remove it right away and then reappoint to see me back in 1 week

## 2014-02-12 NOTE — Progress Notes (Signed)
   Subjective:    Patient ID: [name] [name], female    DOB: 1946/01/08, 68 y.o.   MRN: [id number]  HPI Comments: My left foot hurts all over. Its been hurting for 1 month or so. It hurts to walk and stand. i rolled my foot on an ice bottle. Its getting worse.  Foot Pain      Review of Systems     Objective:   Physical Exam        Assessment & Plan:

## 2014-02-19 ENCOUNTER — Ambulatory Visit: Payer: Commercial Managed Care - HMO | Admitting: Podiatry

## 2014-03-01 ENCOUNTER — Encounter: Payer: Self-pay | Admitting: Podiatry

## 2014-03-01 ENCOUNTER — Ambulatory Visit (INDEPENDENT_AMBULATORY_CARE_PROVIDER_SITE_OTHER): Payer: Commercial Managed Care - HMO | Admitting: Podiatry

## 2014-03-01 DIAGNOSIS — R609 Edema, unspecified: Secondary | ICD-10-CM

## 2014-03-01 DIAGNOSIS — M722 Plantar fascial fibromatosis: Secondary | ICD-10-CM | POA: Diagnosis not present

## 2014-06-28 ENCOUNTER — Encounter: Payer: Self-pay | Admitting: Family Medicine

## 2014-06-28 ENCOUNTER — Ambulatory Visit (INDEPENDENT_AMBULATORY_CARE_PROVIDER_SITE_OTHER): Payer: Commercial Managed Care - HMO | Admitting: Family Medicine

## 2014-06-28 VITALS — BP 126/78 | HR 76 | Temp 98.5°F | Ht 64.5 in | Wt 120.5 lb

## 2014-06-28 DIAGNOSIS — E890 Postprocedural hypothyroidism: Secondary | ICD-10-CM

## 2014-06-28 DIAGNOSIS — I10 Essential (primary) hypertension: Secondary | ICD-10-CM

## 2014-06-28 DIAGNOSIS — E7800 Pure hypercholesterolemia, unspecified: Secondary | ICD-10-CM

## 2014-06-28 DIAGNOSIS — R1319 Other dysphagia: Secondary | ICD-10-CM | POA: Insufficient documentation

## 2014-06-28 DIAGNOSIS — M79673 Pain in unspecified foot: Secondary | ICD-10-CM

## 2014-06-28 DIAGNOSIS — Z23 Encounter for immunization: Secondary | ICD-10-CM

## 2014-06-28 DIAGNOSIS — G8929 Other chronic pain: Secondary | ICD-10-CM

## 2014-06-28 DIAGNOSIS — R1310 Dysphagia, unspecified: Secondary | ICD-10-CM

## 2014-06-28 MED ORDER — NORTRIPTYLINE HCL 25 MG PO CAPS: 25.0000 mg | ORAL_CAPSULE | Freq: Every day | ORAL | Status: DC

## 2014-06-28 MED ORDER — TEMAZEPAM 30 MG PO CAPS: 30.0000 mg | ORAL_CAPSULE | Freq: Every day | ORAL | Status: DC

## 2014-06-28 MED ORDER — ALBUTEROL SULFATE HFA 108 (90 BASE) MCG/ACT IN AERS: 2.0000 | INHALATION_SPRAY | RESPIRATORY_TRACT | Status: DC | PRN

## 2014-06-28 MED ORDER — ATORVASTATIN CALCIUM 40 MG PO TABS: 40.0000 mg | ORAL_TABLET | Freq: Every day | ORAL | Status: DC

## 2014-06-28 MED ORDER — LISINOPRIL 10 MG PO TABS: 5.0000 mg | ORAL_TABLET | Freq: Every day | ORAL | Status: DC

## 2014-06-28 MED ORDER — FLUTICASONE FUROATE 27.5 MCG/SPRAY NA SUSP: 2.0000 | Freq: Every day | NASAL | Status: DC

## 2014-06-28 MED ORDER — LEVOTHYROXINE SODIUM 75 MCG PO TABS: 75.0000 ug | ORAL_TABLET | Freq: Every day | ORAL | Status: DC

## 2014-06-28 MED ORDER — NORTRIPTYLINE HCL 75 MG PO CAPS: 75.0000 mg | ORAL_CAPSULE | Freq: Every day | ORAL | Status: DC

## 2014-06-28 MED ORDER — MELOXICAM 15 MG PO TABS: 15.0000 mg | ORAL_TABLET | Freq: Every day | ORAL | Status: DC

## 2014-06-28 NOTE — Assessment & Plan Note (Signed)
No clinical changes  Lab today for tsh Refilled levothyroxine

## 2014-06-28 NOTE — Assessment & Plan Note (Signed)
Disc goals for lipids and reasons to control them Rev labs with pt- last visit On statin  Rev low sat fat diet in detail  Re check lipids today

## 2014-06-28 NOTE — Progress Notes (Signed)
Subjective:    Patient ID: [name] [name], female    DOB: Dec 06, 1945, 68 y.o.   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  She is happy  Keeps her active but not exercising in addn to that   Trying to take good care of herself   bp is stable today  No cp or palpitations or headaches or edema  No side effects to medicines  BP Readings from Last 3 Encounters:  06/28/14 126/78  02/12/14 115/65  02/05/14 138/90     Wt is stable     Hypothyroidism  Pt has no clinical changes No change in energy level/ hair or skin/ edema and no tremor Lab Results  Component Value Date   TSH 2.12 12/26/2013     Flu vaccine Pneumonia vaccine   hyperlipdemia On lipitor and diet Lab Results  Component Value Date   CHOL 233* 12/26/2013   HDL 81.10 12/26/2013   LDLCALC 139* 12/26/2013   LDLDIRECT 140.9 07/04/2013   TRIG 64.0 12/26/2013   CHOLHDL 3 12/26/2013   tries to stay away from cholesterol  No fried food or red meat  occ a pc of bacon-not often   OP- on fosamax  She is starting to have trouble swallowing and some indigestion  ? If this is the cause   She still has trouble sleeping even with current medicines  The med helps some  Does get up early in am now-and this still does not help sleep   Foot problems persist No better and no worse  Needs a new handicapped form for driving   Patient Active Problem List   Diagnosis Date Noted  . Left foot pain 07/04/2013  . Sebaceous cyst 01/08/2013  . Chronic foot pain 10/27/2011  . Preoperative examination 04/14/2011  . Low back pain 04/14/2011  . FEVER BLISTER 11/17/2010  . Essential hypertension 08/18/2009  . BUNION 03/04/2009  . ALLERGIC RHINITIS 01/10/2008  . TOBACCO USE, QUIT 02/10/2007  . GOITER, MULTINODULAR 02/09/2007  . Hypothyroidism 02/09/2007  . HYPERCHOLESTEROLEMIA 02/09/2007  . DEPRESSION 02/09/2007  .  OSTEOARTHRITIS 02/09/2007  . OSTEOPOROSIS 02/09/2007  . SLEEP DISORDER 02/09/2007   Past Medical History  Diagnosis Date  . Depression   . Hypothyroidism   . Osteoarthritis   . Osteoporosis   . Tobacco abuse     past; quit 09  . Sleep disorder   . Shingles     arm 1/12   Past Surgical History  Procedure Laterality Date  . Hyerectomy- cervical ca cells    . Left wrist fracture      surgery  . Bunions  06/1998  . Hashimoto  02/1997  . Dexa  12/1996  . Biopsy thyroid      pos neoplasm, surgery 09/2006  . Cxr-copd, ts partial compression fracture  12/2006  . Right thyroid lobectomy, goiter, thyroiditis  12/2006  . Thyroidectomy  11/2009  . Stress fractures foot     History  Substance Use Topics  . Smoking status: Former Research scientist (life sciences)  . Smokeless tobacco: Never Used     Comment: Quit 10/09  . Alcohol Use: No   Family History  Problem Relation Age of Onset  . Diabetes Mother   . Coronary artery disease Mother   . Stroke Father   . Hypertension Father    No Known Allergies Current Outpatient Prescriptions on  File Prior to Visit  Medication Sig Dispense Refill  . albuterol (PROVENTIL HFA;VENTOLIN HFA) 108 (90 BASE) MCG/ACT inhaler Inhale 2 puffs into the lungs every 4 (four) hours as needed for wheezing.  1 Inhaler  3  . alendronate (FOSAMAX) 70 MG tablet Take 1 tablet (70 mg total) by mouth every 7 (seven) days. Take with a full glass of water on an empty stomach.  12 tablet  3  . atorvastatin (LIPITOR) 40 MG tablet Take 1 tablet (40 mg total) by mouth daily.  90 tablet  3  . Calcium Carbonate-Vit D-Min (CALCIUM 1200 PO) Take by mouth daily.        . cefUROXime (CEFTIN) 500 MG tablet Take 1 tablet (500 mg total) by mouth 2 (two) times daily with a meal.  14 tablet  0  . fluticasone (VERAMYST) 27.5 MCG/SPRAY nasal spray Place 2 sprays into the nose daily.  30 g  3  . gabapentin (NEURONTIN) 300 MG capsule 1 by mouth at bedtiem can take 1 in am if needed for painful rash this will  cause sleepiness  90 capsule  3  . ibuprofen (ADVIL,MOTRIN) 800 MG tablet Take one by mouth three times a day for 5 days       . levothyroxine (SYNTHROID, LEVOTHROID) 75 MCG tablet Take 1 tablet (75 mcg total) by mouth daily.  90 tablet  3  . lisinopril (ZESTRIL) 10 MG tablet Take 0.5 tablets (5 mg total) by mouth daily.  90 tablet  3  . meloxicam (MOBIC) 15 MG tablet Take 1 tablet (15 mg total) by mouth daily. With food as needed for foot pain  30 tablet  1  . mupirocin ointment (BACTROBAN) 2 % Apply to affected area twice daily  15 g  1  . nortriptyline (PAMELOR) 25 MG capsule Take 1 capsule (25 mg total) by mouth at bedtime. w 75 mg=with her 75 mg capsule also  90 capsule  3  . nortriptyline (PAMELOR) 75 MG capsule Take 1 capsule (75 mg total) by mouth at bedtime.  90 capsule  3  . temazepam (RESTORIL) 30 MG capsule Take 1 capsule (30 mg total) by mouth at bedtime.  90 capsule  1  . valACYclovir (VALTREX) 1000 MG tablet Take 2 pills by mouth twice daily for one day as needed for cold sore  4 tablet  5   No current facility-administered medications on file prior to visit.     Review of Systems Review of Systems  Constitutional: Negative for fever, appetite change, fatigue and unexpected weight change.  Eyes: Negative for pain and visual disturbance.  ENT pos for trouble swallowing/ neg for ST Respiratory: Negative for cough and shortness of breath.   Cardiovascular: Negative for cp or palpitations    Gastrointestinal: Negative for nausea, diarrhea and constipation. pos for heartburn  Genitourinary: Negative for urgency and frequency.  Skin: Negative for pallor or rash   Neurological: Negative for weakness, light-headedness, numbness and headaches.  Hematological: Negative for adenopathy. Does not bruise/bleed easily.  Psychiatric/Behavioral: Negative for dysphoric mood. The patient is not nervous/anxious.         Objective:   Physical Exam  Constitutional: She appears well-developed  and well-nourished. No distress.  Slim and well appearing   HENT:  Head: Normocephalic and atraumatic.  Right Ear: External ear normal.  Left Ear: External ear normal.  Nose: Nose normal.  Mouth/Throat: Oropharynx is clear and moist.  Eyes: Conjunctivae and EOM are normal. Pupils are equal, round, and reactive  to light. Right eye exhibits no discharge. Left eye exhibits no discharge. No scleral icterus.  Neck: Normal range of motion. Neck supple. No JVD present. No thyromegaly present.  Thyroid surgically absent   Cardiovascular: Normal rate, regular rhythm, normal heart sounds and intact distal pulses.  Exam reveals no gallop.   Pulmonary/Chest: Effort normal and breath sounds normal. No respiratory distress. She has no wheezes. She has no rales.  Abdominal: Soft. Bowel sounds are normal. She exhibits no distension and no mass. There is no tenderness. There is no rebound and no guarding.  Musculoskeletal: She exhibits tenderness. She exhibits no edema.  bilat feet -bunion and foot deformity from OA   Lymphadenopathy:    She has no cervical adenopathy.  Neurological: She is alert. She has normal reflexes. No cranial nerve deficit. She exhibits normal muscle tone. Coordination normal.  Skin: Skin is warm and dry. No rash noted. No erythema. No pallor.  Psychiatric: She has a normal mood and affect.          Assessment & Plan:   Problem List Items Addressed This Visit     Cardiovascular and Mediastinum   Essential hypertension - Primary      bp in fair control at this time  BP Readings from Last 1 Encounters:  06/28/14 126/78   No changes needed Disc lifstyle change with low sodium diet and exercise  Last labs reviewed     Relevant Medications      atorvastatin (LIPITOR) tablet      lisinopril (PRINIVIL,ZESTRIL) tablet     Digestive   Dysphagia     Suspect from fosamax (also heartburn) Will hold it  She will update [place] if symptoms are not gone in 2 wk  No change in  thyroid exam      Endocrine   Hypothyroidism     No clinical changes  Lab today for tsh Refilled levothyroxine     Relevant Medications      levothyroxine (SYNTHROID, LEVOTHROID) tablet   Other Relevant Orders      TSH (Completed)     Other   HYPERCHOLESTEROLEMIA     Disc goals for lipids and reasons to control them Rev labs with pt- last visit On statin  Rev low sat fat diet in detail  Re check lipids today    Relevant Medications      atorvastatin (LIPITOR) tablet      lisinopril (PRINIVIL,ZESTRIL) tablet   Other Relevant Orders      Lipid panel (Completed)   Chronic foot pain     Handicapped parking form renewed      Other Visit Diagnoses   Need for prophylactic vaccination and inoculation against influenza        Relevant Orders       Flu Vaccine QUAD 36+ mos PF IM (Fluarix Quad PF) (Completed)    Need for 23-polyvalent pneumococcal polysaccharide vaccine        Relevant Orders       Pneumococcal polysaccharide vaccine 23-valent greater than or equal to 2yo subcutaneous/IM (Completed)

## 2014-06-28 NOTE — Assessment & Plan Note (Signed)
bp in fair control at this time  BP Readings from Last 1 Encounters:  06/28/14 126/78   No changes needed Disc lifstyle change with low sodium diet and exercise  Last labs reviewed

## 2014-06-28 NOTE — Assessment & Plan Note (Signed)
Suspect from fosamax (also heartburn) Will hold it  She will update [place] if symptoms are not gone in 2 wk  No change in thyroid exam

## 2014-06-28 NOTE — Progress Notes (Signed)
Pre visit review using our clinic review tool, if applicable. No additional management support is needed unless otherwise documented below in the visit note. 

## 2014-06-28 NOTE — Assessment & Plan Note (Signed)
Handicapped parking form renewed

## 2014-06-28 NOTE — Patient Instructions (Signed)
Labs today  Stop the fosamax (alendronate) - this may be causing the swallowing problems and heartburn --- let me know if you do not improve off of it  Flu and pneumonia vaccines today    Follow up for annual exam in 6 months with labs prior

## 2014-06-29 LAB — LIPID PANEL
Cholesterol: 199 mg/dL (ref 0–200)
HDL: 77 mg/dL (ref >39)
LDL Cholesterol: 111 mg/dL — ABNORMAL HIGH (ref 0–99)
Total CHOL/HDL Ratio: 2.6 Ratio
Triglycerides: 55 mg/dL (ref <150)
VLDL: 11 mg/dL (ref 0–40)

## 2014-06-29 LAB — TSH: TSH: 0.123 u[IU]/mL — ABNORMAL LOW (ref 0.350–4.500)

## 2014-07-04 ENCOUNTER — Telehealth: Payer: Self-pay | Admitting: *Deleted

## 2014-07-04 MED ORDER — LEVOTHYROXINE SODIUM 50 MCG PO TABS: 50.0000 ug | ORAL_TABLET | Freq: Every day | ORAL | Status: DC

## 2014-07-04 NOTE — Telephone Encounter (Signed)
Message copied by Tammi Sou on Thu Jul 04, 2014  4:11 PM ------      Message from: Loura Pardon A      Created: Sun Jun 30, 2014 10:25 AM       I need to change her levothyroxine from 75 to 50 mcg (please cancel px I prev sent)       Please call in 50 mcg to local pharmacy 1 po qd # 30 3 ref      Re check tsh in 6 weeks please      Cholesterol is ok ------

## 2014-07-04 NOTE — Telephone Encounter (Signed)
Rx sent and pt notified.

## 2014-08-07 ENCOUNTER — Other Ambulatory Visit: Payer: Self-pay | Admitting: Family Medicine

## 2014-08-07 DIAGNOSIS — E890 Postprocedural hypothyroidism: Secondary | ICD-10-CM

## 2014-08-15 ENCOUNTER — Other Ambulatory Visit (INDEPENDENT_AMBULATORY_CARE_PROVIDER_SITE_OTHER): Payer: Medicare HMO

## 2014-08-15 DIAGNOSIS — E890 Postprocedural hypothyroidism: Secondary | ICD-10-CM

## 2014-08-15 LAB — TSH: TSH: 2.29 u[IU]/mL (ref 0.35–4.50)

## 2014-08-16 ENCOUNTER — Encounter: Payer: Self-pay | Admitting: *Deleted

## 2014-10-10 ENCOUNTER — Other Ambulatory Visit: Payer: Self-pay | Admitting: Family Medicine

## 2014-10-10 NOTE — Telephone Encounter (Signed)
Px written for call in   

## 2014-10-10 NOTE — Telephone Encounter (Signed)
Ok to refill. Last prescribed and seen on 06/28/14. No future appt

## 2014-10-10 NOTE — Telephone Encounter (Signed)
Px called in to [facility].

## 2014-12-16 ENCOUNTER — Telehealth: Payer: Self-pay | Admitting: Family Medicine

## 2014-12-16 NOTE — Telephone Encounter (Signed)
Pt dropped off employment physical form to be filled out. Will put inbox

## 2014-12-17 NOTE — Telephone Encounter (Signed)
Patient aware form is ready for pick up.

## 2015-01-07 ENCOUNTER — Other Ambulatory Visit: Payer: Self-pay | Admitting: Family Medicine

## 2015-01-08 NOTE — Telephone Encounter (Signed)
Px printed for pick up in IN box  

## 2015-01-08 NOTE — Telephone Encounter (Signed)
Electronic refill request, last refilled 10/10/14 #90 with 0 refills. Since it's mail order pharmacy I would need a paper Rx to fax to them

## 2015-01-08 NOTE — Telephone Encounter (Signed)
Rx faxed

## 2015-01-27 ENCOUNTER — Telehealth: Payer: Self-pay

## 2015-01-27 NOTE — Telephone Encounter (Signed)
Pt left v/m requesting refill nortriptylline 75 mg to [facility] [address]. Pt should have available refills at [place]. Left v/m requesting pt to cb.

## 2015-01-28 MED ORDER — NORTRIPTYLINE HCL 75 MG PO CAPS: 75.0000 mg | ORAL_CAPSULE | Freq: Every day | ORAL | Status: DC

## 2015-01-28 NOTE — Telephone Encounter (Signed)
Spoke with pt she has spoken with [facility] and was advised nortriptylline 75 mg was shipped on 01/21/15; pt request small quantity sent to [facility] [address].   Refill done as requested and per guidelines. Pt notified refill done.

## 2015-02-13 ENCOUNTER — Telehealth: Payer: Self-pay | Admitting: Family Medicine

## 2015-02-13 NOTE — Telephone Encounter (Signed)
Pt called she needs to get a tb skin test for work   Is it ok to schedule??

## 2015-02-14 NOTE — Telephone Encounter (Signed)
Please schedule a nurse visit for that  

## 2015-02-14 NOTE — Telephone Encounter (Signed)
Called pt   Pt stated she went [facility] and had test done

## 2015-03-31 ENCOUNTER — Other Ambulatory Visit: Payer: Self-pay | Admitting: Family Medicine

## 2015-03-31 NOTE — Telephone Encounter (Signed)
Electronic refill request, please advise  

## 2015-03-31 NOTE — Telephone Encounter (Signed)
She is about a week early on the restoril - have her call back closer to when it is due (since it is controlled) Please refill thyroid med for 6 mo

## 2015-04-01 MED ORDER — LEVOTHYROXINE SODIUM 50 MCG PO TABS: 50.0000 ug | ORAL_TABLET | Freq: Every day | ORAL | Status: DC

## 2015-04-10 ENCOUNTER — Other Ambulatory Visit: Payer: Self-pay | Admitting: Family Medicine

## 2015-04-11 NOTE — Telephone Encounter (Signed)
Printed to fax  

## 2015-04-11 NOTE — Telephone Encounter (Signed)
Rx faxed

## 2015-04-11 NOTE — Telephone Encounter (Signed)
Electronic refill request, last refilled on 01/08/15 #90 with 0 refills, last OV was 06/28/14, since it's a mail order pharmacy I would need a paper Rx to fax to them, please advise

## 2015-05-08 ENCOUNTER — Encounter: Payer: Self-pay | Admitting: *Deleted

## 2015-05-08 ENCOUNTER — Emergency Department
Admission: EM | Admit: 2015-05-08 | Discharge: 2015-05-08 | Disposition: A | Discharge status: Home / Self Care | Payer: Commercial Managed Care - HMO | Attending: Emergency Medicine | Admitting: Emergency Medicine

## 2015-05-08 DIAGNOSIS — Z7951 Long term (current) use of inhaled steroids: Secondary | ICD-10-CM | POA: Insufficient documentation

## 2015-05-08 DIAGNOSIS — Y9389 Activity, other specified: Secondary | ICD-10-CM | POA: Diagnosis not present

## 2015-05-08 DIAGNOSIS — S60562A Insect bite (nonvenomous) of left hand, initial encounter: Secondary | ICD-10-CM | POA: Diagnosis not present

## 2015-05-08 DIAGNOSIS — Z720 Tobacco use: Secondary | ICD-10-CM | POA: Insufficient documentation

## 2015-05-08 DIAGNOSIS — Y9289 Other specified places as the place of occurrence of the external cause: Secondary | ICD-10-CM | POA: Diagnosis not present

## 2015-05-08 DIAGNOSIS — Z791 Long term (current) use of non-steroidal anti-inflammatories (NSAID): Secondary | ICD-10-CM | POA: Insufficient documentation

## 2015-05-08 DIAGNOSIS — W57XXXA Bitten or stung by nonvenomous insect and other nonvenomous arthropods, initial encounter: Secondary | ICD-10-CM | POA: Insufficient documentation

## 2015-05-08 DIAGNOSIS — Z792 Long term (current) use of antibiotics: Secondary | ICD-10-CM | POA: Insufficient documentation

## 2015-05-08 DIAGNOSIS — Y998 Other external cause status: Secondary | ICD-10-CM | POA: Diagnosis not present

## 2015-05-08 DIAGNOSIS — Z79899 Other long term (current) drug therapy: Secondary | ICD-10-CM | POA: Insufficient documentation

## 2015-05-08 MED ORDER — DIPHENHYDRAMINE HCL 25 MG PO CAPS
25.0000 mg | ORAL_CAPSULE | Freq: Once | ORAL | Status: AC
  Administered 2015-05-08: 25 mg via ORAL
  Filled 2015-05-08: qty 1

## 2015-05-08 MED ORDER — PREDNISONE 10 MG (21) PO TBPK: ORAL_TABLET | ORAL | Status: DC

## 2015-05-08 NOTE — ED Notes (Signed)
Pt has a wasp sting to left hand.  Swelling to left hand.   Pt has itching.  No resp distress.  No rash.  Pt took no otc meds after sting occurred.  Pt alert.

## 2015-05-08 NOTE — ED Notes (Signed)
Left hand swollen post bee sting   No resp issues.  resp even and non labored   PA in pt on arrival

## 2015-05-08 NOTE — ED Provider Notes (Signed)
[facility] Emergency Department Provider Note  ____________________________________________  Time seen: Approximately 5:10 PM  I have reviewed the triage vital signs and the nursing notes.   HISTORY  Chief Complaint Insect Bite    HPI [name] [name] is a 69 y.o. female who was stung by a wasp earlier today. She developed swelling in the hand at the site of the sting. She denies shortness of breath chest pain or difficulty swallowing. She has some soreness and redness to the left hand. She has not taken any medicine. She applied alcohol to the hand. No fevers or chills.    Past Medical History  Diagnosis Date  . Depression   . Hypothyroidism   . Osteoarthritis   . Osteoporosis   . Tobacco abuse     past; quit 09  . Sleep disorder   . Shingles     arm 1/12    Patient Active Problem List   Diagnosis Date Noted  . Dysphagia 06/28/2014  . Left foot pain 07/04/2013  . Sebaceous cyst 01/08/2013  . Chronic foot pain 10/27/2011  . Preoperative examination 04/14/2011  . Low back pain 04/14/2011  . FEVER BLISTER 11/17/2010  . Essential hypertension 08/18/2009  . BUNION 03/04/2009  . ALLERGIC RHINITIS 01/10/2008  . TOBACCO USE, QUIT 02/10/2007  . GOITER, MULTINODULAR 02/09/2007  . Hypothyroidism 02/09/2007  . HYPERCHOLESTEROLEMIA 02/09/2007  . DEPRESSION 02/09/2007  . OSTEOARTHRITIS 02/09/2007  . OSTEOPOROSIS 02/09/2007  . SLEEP DISORDER 02/09/2007    Past Surgical History  Procedure Laterality Date  . Hyerectomy- cervical ca cells    . Left wrist fracture      surgery  . Bunions  06/1998  . Hashimoto  02/1997  . Dexa  12/1996  . Biopsy thyroid      pos neoplasm, surgery 09/2006  . Cxr-copd, ts partial compression fracture  12/2006  . Right thyroid lobectomy, goiter, thyroiditis  12/2006  . Thyroidectomy  11/2009  . Stress fractures foot      Current Outpatient Rx  Name  Route  Sig  Dispense  Refill  . albuterol (PROVENTIL HFA;VENTOLIN  HFA) 108 (90 BASE) MCG/ACT inhaler   Inhalation   Inhale 2 puffs into the lungs every 4 (four) hours as needed for wheezing.   1 Inhaler   11   . atorvastatin (LIPITOR) 40 MG tablet   Oral   Take 1 tablet (40 mg total) by mouth daily.   90 tablet   3   . Calcium Carbonate-Vit D-Min (CALCIUM 1200 PO)   Oral   Take by mouth daily.           . fluticasone (VERAMYST) 27.5 MCG/SPRAY nasal spray   Nasal   Place 2 sprays into the nose daily.   30 g   11   . ibuprofen (ADVIL,MOTRIN) 800 MG tablet      Take one by mouth three times a day for 5 days          . levothyroxine (LEVOTHROID) 50 MCG tablet   Oral   Take 1 tablet (50 mcg total) by mouth daily before breakfast.   90 tablet   1   . lisinopril (ZESTRIL) 10 MG tablet   Oral   Take 0.5 tablets (5 mg total) by mouth daily.   90 tablet   3   . meloxicam (MOBIC) 15 MG tablet   Oral   Take 1 tablet (15 mg total) by mouth daily. With food as needed for foot pain   30  tablet   5   . mupirocin ointment (BACTROBAN) 2 %      Apply to affected area twice daily   15 g   1   . nortriptyline (PAMELOR) 25 MG capsule   Oral   Take 1 capsule (25 mg total) by mouth at bedtime. w 75 mg=with her 75 mg capsule also   90 capsule   3   . nortriptyline (PAMELOR) 75 MG capsule   Oral   Take 1 capsule (75 mg total) by mouth at bedtime.   10 capsule   0   . predniSONE (STERAPRED UNI-PAK 21 TAB) 10 MG (21) TBPK tablet      6 tablets on day 1, 5 tablets on day 2, 4 tablets on day 3, etc...   21 tablet   0   . temazepam (RESTORIL) 30 MG capsule      TAKE 1 CAPSULE AT BEDTIME   90 capsule   1   . valACYclovir (VALTREX) 1000 MG tablet      Take 2 pills by mouth twice daily for one day as needed for cold sore   4 tablet   5     Allergies Fosamax  Family History  Problem Relation Age of Onset  . Diabetes Mother   . Coronary artery disease Mother   . Stroke Father   . Hypertension Father     Social  History Social History  Substance Use Topics  . Smoking status: Current Every Day Smoker  . Smokeless tobacco: Never Used     Comment: Quit 10/09  . Alcohol Use: No    Review of Systems Constitutional: No fever/chills Eyes: No visual changes. ENT: No sore throat. Cardiovascular: Denies chest pain. Respiratory: Denies shortness of breath. Gastrointestinal: No abdominal pain.  No nausea, no vomiting.  No diarrhea.  No constipation. Genitourinary: Negative for dysuria. Musculoskeletal: Negative for back pain. Skin: Negative for rash. Neurological: Negative for headaches, focal weakness or numbness.  10-point ROS otherwise negative.  ____________________________________________   PHYSICAL EXAM:  VITAL SIGNS: ED Triage Vitals  Enc Vitals Group     BP 05/08/15 1646 152/65 mmHg     Pulse Rate 05/08/15 1646 84     Resp 05/08/15 1646 17     Temp 05/08/15 1646 98.6 F (37 C)     Temp Source 05/08/15 1646 Oral     SpO2 05/08/15 1646 95 %     Weight 05/08/15 1646 118 lb (53.524 kg)     Height 05/08/15 1646 5' 5.5" (1.664 m)     Head Cir --      Peak Flow --      Pain Score 05/08/15 1653 10     Pain Loc --      Pain Edu? --      Excl. in [place]? --     Constitutional: Alert and oriented. Well appearing and in no acute distress. Eyes: Conjunctivae are normal. PERRL. EOMI. Head: Atraumatic. Nose: No congestion/rhinnorhea. Mouth/Throat: Mucous membranes are moist.  Oropharynx non-erythematous. Neck: No supple Cardiovascular: Normal rate, regular rhythm. Grossly normal heart sounds.  Good peripheral circulation. Respiratory: Normal respiratory effort.  No retractions. Lungs CTAB. Musculoskeletal: No lower extremity tenderness nor edema.  No joint effusions. Left hand shows a general edema with mild erythema and tenderness. Range of motion of the digits, is limited because of swelling. Neurologic:  Normal speech and language. No gross focal neurologic deficits are appreciated. No  gait instability. Skin:  Skin is warm, dry and intact.  No rash noted. Psychiatric: Mood and affect are normal. Speech and behavior are normal.  ____________________________________________   LABS (all labs ordered are listed, but only abnormal results are displayed)  Labs Reviewed - No data to display ____________________________________________  EKG   ____________________________________________  RADIOLOGY   ____________________________________________   PROCEDURES  Procedure(s) performed: None  Critical Care performed: No  ____________________________________________   INITIAL IMPRESSION / ASSESSMENT AND PLAN / ED COURSE  Pertinent labs & imaging results that were available during my care of the patient were reviewed by me and considered in my medical decision making (see chart for details).  69 year old who presents with a insect sting to the left hand. She is given Benadryl 25 mg in the emergency room. If she tolerates this well she may increase the dose to 50 mg every 6-8 hours. If still not improving she may add prednisone taper. She can follow-up with her primary physician or return to the emergency room for worsening symptoms. ____________________________________________   FINAL CLINICAL IMPRESSION(S) / ED DIAGNOSES  Final diagnoses:  Insect bite hand, left, initial encounter      Mortimer Fries, PA-C 05/08/15 [place], MD 05/08/15 2113

## 2015-05-08 NOTE — Discharge Instructions (Signed)
Insect Bite Mosquitoes, flies, fleas, bedbugs, and many other insects can bite. Insect bites are different from insect stings. A sting is when venom is injected into the skin. Some insect bites can transmit infectious diseases. SYMPTOMS  Insect bites usually turn red, swell, and itch for 2 to 4 days. They often go away on their own. TREATMENT  Your caregiver may prescribe antibiotic medicines if a bacterial infection develops in the bite. HOME CARE INSTRUCTIONS  Do not scratch the bite area.  Keep the bite area clean and dry. Wash the bite area thoroughly with soap and water.  Put ice or cool compresses on the bite area.  Put ice in a plastic bag.  Place a towel between your skin and the bag.  Leave the ice on for 20 minutes, 4 times a day for the first 2 to 3 days, or as directed.  You may apply a baking soda paste, cortisone cream, or calamine lotion to the bite area as directed by your caregiver. This can help reduce itching and swelling.  Only take over-the-counter or prescription medicines as directed by your caregiver.  If you are given antibiotics, take them as directed. Finish them even if you start to feel better. You may need a tetanus shot if:  You cannot remember when you had your last tetanus shot.  You have never had a tetanus shot.  The injury broke your skin. If you get a tetanus shot, your arm may swell, get red, and feel warm to the touch. This is common and not a problem. If you need a tetanus shot and you choose not to have one, there is a rare chance of getting tetanus. Sickness from tetanus can be serious. SEEK IMMEDIATE MEDICAL CARE IF:   You have increased pain, redness, or swelling in the bite area.  You see a red line on the skin coming from the bite.  You have a fever.  You have joint pain.  You have a headache or neck pain.  You have unusual weakness.  You have a rash.  You have chest pain or shortness of breath.  You have abdominal pain,  nausea, or vomiting.  You feel unusually tired or sleepy. MAKE SURE YOU:   Understand these instructions.  Will watch your condition.  Will get help right away if you are not doing well or get worse. Document Released: 09/30/2004 Document Revised: 11/15/2011 Document Reviewed: 03/24/2011 [facility] Patient Information 2015 [place], [place]. This information is not intended to replace advice given to you by your health care provider. Make sure you discuss any questions you have with your health care provider.   Continue Benadryl every 6-8 hours as needed for the swelling. If you tolerate the Benadryl fine you may take 50 mg. If needed, fill the prescription for prednisone to take with the Benadryl, if the swelling is not improving. Follow-up with your physician for persistent symptoms. Return to the emergency room for any concerns.

## 2015-06-06 NOTE — Progress Notes (Signed)
   Subjective:    Patient ID: [name] [name], female    DOB: Aug 19, 1946, 69 y.o.   MRN: [id number]  HPI    Review of Systems     Objective:   Physical Exam        Assessment & Plan:

## 2015-07-14 ENCOUNTER — Other Ambulatory Visit: Payer: Self-pay | Admitting: Family Medicine

## 2015-07-14 NOTE — Telephone Encounter (Signed)
Pt hasn't been seen in over a year and no future appt., please advise  

## 2015-07-14 NOTE — Telephone Encounter (Signed)
Please schedule a f/u or PE (her preference) this winter and refill until then Thanks

## 2015-07-15 NOTE — Telephone Encounter (Signed)
Left voicemail requesting pt to call office back 

## 2015-07-22 NOTE — Telephone Encounter (Signed)
Left voicemail requesting pt to call office back 

## 2015-07-24 ENCOUNTER — Other Ambulatory Visit: Payer: Self-pay | Admitting: Family Medicine

## 2015-07-24 MED ORDER — NORTRIPTYLINE HCL 75 MG PO CAPS: 75.0000 mg | ORAL_CAPSULE | Freq: Every day | ORAL | Status: DC

## 2015-07-24 MED ORDER — LEVOTHYROXINE SODIUM 50 MCG PO TABS: 50.0000 ug | ORAL_TABLET | Freq: Every day | ORAL | Status: DC

## 2015-07-24 MED ORDER — NORTRIPTYLINE HCL 25 MG PO CAPS: 25.0000 mg | ORAL_CAPSULE | Freq: Every day | ORAL | Status: DC

## 2015-07-24 MED ORDER — ATORVASTATIN CALCIUM 40 MG PO TABS: 40.0000 mg | ORAL_TABLET | Freq: Every day | ORAL | Status: DC

## 2015-07-24 MED ORDER — LISINOPRIL 10 MG PO TABS: 5.0000 mg | ORAL_TABLET | Freq: Every day | ORAL | Status: DC

## 2015-07-24 NOTE — Telephone Encounter (Signed)
This is a duplicate refill request, I have tried to call pt multiple times because (per Dr. Glori Bickers) she is due for a f/u or CPE before we can refill meds, letter sent letting pt know she needs to schedule an appt

## 2015-07-24 NOTE — Telephone Encounter (Signed)
Called pt again and voicemail box is full, Rxs declined and letter mailed to pt letter her know she needs to schedule an appt

## 2015-07-24 NOTE — Telephone Encounter (Signed)
Pt left v/m requesting cb about refill refusals.

## 2015-07-24 NOTE — Addendum Note (Signed)
Addended by: Tammi Sou on: 07/24/2015 04:14 PM   Modules accepted: Orders

## 2015-07-24 NOTE — Telephone Encounter (Signed)
appt scheduled, pt's phone # had changed and we had old # on file, chart updated and Rxs refilled

## 2015-07-25 ENCOUNTER — Ambulatory Visit (INDEPENDENT_AMBULATORY_CARE_PROVIDER_SITE_OTHER): Payer: Commercial Managed Care - HMO

## 2015-07-25 DIAGNOSIS — Z23 Encounter for immunization: Secondary | ICD-10-CM

## 2015-08-08 ENCOUNTER — Ambulatory Visit (INDEPENDENT_AMBULATORY_CARE_PROVIDER_SITE_OTHER): Payer: Commercial Managed Care - HMO | Admitting: Family Medicine

## 2015-08-08 ENCOUNTER — Encounter: Payer: Self-pay | Admitting: Family Medicine

## 2015-08-08 ENCOUNTER — Other Ambulatory Visit: Payer: Self-pay

## 2015-08-08 VITALS — BP 135/80 | HR 70 | Temp 98.1°F | Ht 64.5 in | Wt 119.2 lb

## 2015-08-08 DIAGNOSIS — I10 Essential (primary) hypertension: Secondary | ICD-10-CM

## 2015-08-08 DIAGNOSIS — E7800 Pure hypercholesterolemia, unspecified: Secondary | ICD-10-CM | POA: Diagnosis not present

## 2015-08-08 DIAGNOSIS — Z79899 Other long term (current) drug therapy: Secondary | ICD-10-CM | POA: Diagnosis not present

## 2015-08-08 DIAGNOSIS — Z23 Encounter for immunization: Secondary | ICD-10-CM

## 2015-08-08 DIAGNOSIS — E890 Postprocedural hypothyroidism: Secondary | ICD-10-CM | POA: Diagnosis not present

## 2015-08-08 DIAGNOSIS — J302 Other seasonal allergic rhinitis: Secondary | ICD-10-CM

## 2015-08-08 DIAGNOSIS — G479 Sleep disorder, unspecified: Secondary | ICD-10-CM | POA: Diagnosis not present

## 2015-08-08 DIAGNOSIS — F4321 Adjustment disorder with depressed mood: Secondary | ICD-10-CM

## 2015-08-08 MED ORDER — LISINOPRIL 10 MG PO TABS: 5.0000 mg | ORAL_TABLET | Freq: Every day | ORAL | Status: DC

## 2015-08-08 MED ORDER — NORTRIPTYLINE HCL 75 MG PO CAPS: 75.0000 mg | ORAL_CAPSULE | Freq: Every day | ORAL | Status: DC

## 2015-08-08 MED ORDER — LEVOTHYROXINE SODIUM 50 MCG PO TABS: 50.0000 ug | ORAL_TABLET | Freq: Every day | ORAL | Status: DC

## 2015-08-08 MED ORDER — ATORVASTATIN CALCIUM 40 MG PO TABS: 40.0000 mg | ORAL_TABLET | Freq: Every day | ORAL | Status: DC

## 2015-08-08 MED ORDER — NORTRIPTYLINE HCL 25 MG PO CAPS: 25.0000 mg | ORAL_CAPSULE | Freq: Every day | ORAL | Status: DC

## 2015-08-08 NOTE — Patient Instructions (Signed)
Continue your nasal spray for sinus issues  You can try claritin for runny nose over the counter if needed  Call [employer] to see what the status of your medicines is and then me know if I need to send anything to a local pharmacy temporarily  Keep talking to friends and family and going to church    Schedule fasting labs for 3-4 weeks

## 2015-08-08 NOTE — Telephone Encounter (Signed)
Pt called and [employer] would not refill any of meds; I called [employer] and spoke with Anderson Malta who said atorvastatin,levothyroxine,lisinopril,nortriptyline 25 mg and 75 mg were all refused on 07/24/15 refill; advise that was not correct, all were approved. Spoke with Gerald Stabs pharmacist and he updated by phone; pt should receive in 14 days. Pt request 14 days of meds sent to [address]. Advised done.

## 2015-08-08 NOTE — Progress Notes (Signed)
Pre visit review using our clinic review tool, if applicable. No additional management support is needed unless otherwise documented below in the visit note. 

## 2015-08-08 NOTE — Progress Notes (Signed)
Subjective:    Patient ID: [name] [name], female    DOB: 02-01-1946, 69 y.o.   MRN: [id number]  HPI Here for f/u of chronic medical problems   Husband passed  Away from throat cancer in Aug  She is by herself now  Grief is tough- night time is terrible  Has family in the area - sees frequently  Is trying to get out a little bit when she can  Also goes to church   Some sinus problems  Physically about the same   Wt is up 1 lb with bmi of 20  Fairly stable   Hypothyroidism  Pt has no clinical changes No change in energy level/ hair or skin/ edema and no tremor Lab Results  Component Value Date   TSH 2.29 08/15/2014    Due for tsh   bp is stable today  No cp or palpitations or headaches or edema  No side effects to medicines  BP Readings from Last 3 Encounters:  08/08/15 134/90  05/08/15 152/65  06/28/14 126/78      Takes restoril for sleep  Out of medicine for 4 days - we sent it to [facility] on 11/17- she is going to call today   Takes her restoril every night - harder to sleep still since her husband died    Patient Active Problem List   Diagnosis Date Noted  . Grief reaction 08/08/2015  . Dysphagia 06/28/2014  . Left foot pain 07/04/2013  . Sebaceous cyst 01/08/2013  . Chronic foot pain 10/27/2011  . Preoperative examination 04/14/2011  . Low back pain 04/14/2011  . FEVER BLISTER 11/17/2010  . Essential hypertension 08/18/2009  . BUNION 03/04/2009  . Allergic rhinitis 01/10/2008  . TOBACCO USE, QUIT 02/10/2007  . GOITER, MULTINODULAR 02/09/2007  . Hypothyroidism 02/09/2007  . HYPERCHOLESTEROLEMIA 02/09/2007  . DEPRESSION 02/09/2007  . OSTEOARTHRITIS 02/09/2007  . OSTEOPOROSIS 02/09/2007  . Disturbance in sleep behavior 02/09/2007   Past Medical History  Diagnosis Date  . Depression   . Hypothyroidism   . Osteoarthritis   . Osteoporosis   . Tobacco abuse     past; quit 09  . Sleep disorder   . Shingles     arm 1/12   Past Surgical History    Procedure Laterality Date  . Hyerectomy- cervical ca cells    . Left wrist fracture      surgery  . Bunions  06/1998  . Hashimoto  02/1997  . Dexa  12/1996  . Biopsy thyroid      pos neoplasm, surgery 09/2006  . Cxr-copd, ts partial compression fracture  12/2006  . Right thyroid lobectomy, goiter, thyroiditis  12/2006  . Thyroidectomy  11/2009  . Stress fractures foot     Social History  Substance Use Topics  . Smoking status: Former Research scientist (life sciences)  . Smokeless tobacco: Never Used     Comment: Quit 10/09  . Alcohol Use: No   Family History  Problem Relation Age of Onset  . Diabetes Mother   . Coronary artery disease Mother   . Stroke Father   . Hypertension Father    Allergies  Allergen Reactions  . Fosamax [Alendronate Sodium]     Dysphagia and heartburn    Current Outpatient Prescriptions on File Prior to Visit  Medication Sig Dispense Refill  . albuterol (PROVENTIL HFA;VENTOLIN HFA) 108 (90 BASE) MCG/ACT inhaler Inhale 2 puffs into the lungs every 4 (four) hours as needed for wheezing. 1 Inhaler 11  . Calcium Carbonate-Vit  D-Min (CALCIUM 1200 PO) Take by mouth daily.      . fluticasone (VERAMYST) 27.5 MCG/SPRAY nasal spray Place 2 sprays into the nose daily. 30 g 11  . ibuprofen (ADVIL,MOTRIN) 800 MG tablet Take one by mouth three times a day for 5 days     . meloxicam (MOBIC) 15 MG tablet Take 1 tablet (15 mg total) by mouth daily. With food as needed for foot pain 30 tablet 5  . mupirocin ointment (BACTROBAN) 2 % Apply to affected area twice daily 15 g 1  . predniSONE (STERAPRED UNI-PAK 21 TAB) 10 MG (21) TBPK tablet 6 tablets on day 1, 5 tablets on day 2, 4 tablets on day 3, etc... 21 tablet 0  . temazepam (RESTORIL) 30 MG capsule TAKE 1 CAPSULE AT BEDTIME 90 capsule 1  . valACYclovir (VALTREX) 1000 MG tablet Take 2 pills by mouth twice daily for one day as needed for cold sore 4 tablet 5   No current facility-administered medications on file prior to visit.     Review of  Systems Review of Systems  Constitutional: Negative for fever, appetite change, fatigue and unexpected weight change.  Eyes: Negative for pain and visual disturbance.  Respiratory: Negative for cough and shortness of breath.   Cardiovascular: Negative for cp or palpitations    Gastrointestinal: Negative for nausea, diarrhea and constipation.  Genitourinary: Negative for urgency and frequency.  Skin: Negative for pallor or rash   Neurological: Negative for weakness, light-headedness, numbness and headaches.  Hematological: Negative for adenopathy. Does not bruise/bleed easily.  Psychiatric/Behavioral: Negative for dysphoric mood. The patient is not nervous/anxious.  pos for sleep disturbance , pos for grief        Objective:   Physical Exam  Constitutional: She appears well-developed and well-nourished. No distress.  Slim and frail appearing elderly female  HENT:  Head: Normocephalic and atraumatic.  Mouth/Throat: Oropharynx is clear and moist.  Eyes: Conjunctivae and EOM are normal. Pupils are equal, round, and reactive to light.  Neck: Normal range of motion. Neck supple. No JVD present. Carotid bruit is not present. No thyromegaly present.  Baseline scar from thyroidectomy  Cardiovascular: Normal rate, regular rhythm, normal heart sounds and intact distal pulses.  Exam reveals no gallop.   Pulmonary/Chest: Effort normal and breath sounds normal. No respiratory distress. She has no wheezes. She has no rales.  No crackles  Abdominal: Soft. Bowel sounds are normal. She exhibits no distension, no abdominal bruit and no mass. There is no tenderness.  Musculoskeletal: She exhibits no edema.  Lymphadenopathy:    She has no cervical adenopathy.  Neurological: She is alert. She has normal reflexes. No cranial nerve deficit. She exhibits normal muscle tone. Coordination normal.  Skin: Skin is warm and dry. No rash noted. No erythema. No pallor.  Psychiatric: She has a normal mood and  affect.  Pleasant and talkative  Sad at times when discussing her husband          Assessment & Plan:   Problem List Items Addressed This Visit      Cardiovascular and Mediastinum   Essential hypertension - Primary    bp in fair control at this time  BP Readings from Last 1 Encounters:  08/08/15 135/80   No changes needed Disc lifstyle change with low sodium diet and exercise  Out of her med for several days- waiting for mail order - and control is still ok  No symptoms         Respiratory  Allergic rhinitis    Recommend otc antihistamine like claritin in addition to steroid ns as needed Update if not helpful or if s/s of sinusitis         Endocrine   Hypothyroidism    Hypothyroidism  Pt has no clinical changes No change in energy level/ hair or skin/ edema and no tremor Is out of med- will check lab about 2 weeks after starting back on thyroid med and see if adj are needed          Other   Disturbance in sleep behavior    Uses restoril nightly after failure of other meds  More trouble with recent grief- getting by ok overall -will continue to follow       Grief reaction    Lost husband this summer  Overall doing ok  Denies need for grief counseling - offered  Has good family support and stays social       HYPERCHOLESTEROLEMIA    Disc goals for lipids and reasons to control them Rev labs with pt from last draw  Rev low sat fat diet in detail  Will check labs 2 wk after starting back on atorvastatin  Disc diet        Other Visit Diagnoses    Need for vaccination with 13-polyvalent pneumococcal conjugate vaccine        Relevant Orders    Pneumococcal conjugate vaccine 13-valent (Completed)

## 2015-08-10 NOTE — Assessment & Plan Note (Signed)
Hypothyroidism  Pt has no clinical changes No change in energy level/ hair or skin/ edema and no tremor Is out of med- will check lab about 2 weeks after starting back on thyroid med and see if adj are needed

## 2015-08-10 NOTE — Assessment & Plan Note (Signed)
bp in fair control at this time  BP Readings from Last 1 Encounters:  08/08/15 135/80   No changes needed Disc lifstyle change with low sodium diet and exercise  Out of her med for several days- waiting for mail order - and control is still ok  No symptoms

## 2015-08-10 NOTE — Assessment & Plan Note (Signed)
Recommend otc antihistamine like claritin in addition to steroid ns as needed Update if not helpful or if s/s of sinusitis

## 2015-08-10 NOTE — Assessment & Plan Note (Signed)
Lost husband this summer  Overall doing ok  Denies need for grief counseling - offered  Has good family support and stays social

## 2015-08-10 NOTE — Assessment & Plan Note (Signed)
Uses restoril nightly after failure of other meds  More trouble with recent grief- getting by ok overall -will continue to follow

## 2015-08-10 NOTE — Assessment & Plan Note (Signed)
Disc goals for lipids and reasons to control them Rev labs with pt from last draw  Rev low sat fat diet in detail  Will check labs 2 wk after starting back on atorvastatin  Disc diet

## 2015-08-25 ENCOUNTER — Encounter: Payer: Self-pay | Admitting: Family Medicine

## 2015-09-15 ENCOUNTER — Other Ambulatory Visit: Payer: Self-pay | Admitting: Family Medicine

## 2015-09-15 NOTE — Telephone Encounter (Signed)
Last OV was med refill appt on 08/08/15, last refilled on 04/11/15 #90 with 1 additional refill, since it's mail order I would need a paper Rx to fax to them

## 2015-09-15 NOTE — Telephone Encounter (Signed)
Is it a little early? Should she not need it until early feb or am I counting wrong?  Let me know-thanks

## 2015-10-17 ENCOUNTER — Other Ambulatory Visit: Payer: Self-pay | Admitting: Family Medicine

## 2015-10-17 NOTE — Telephone Encounter (Signed)
Rx faxed to [facility] mail order pharmacy.  

## 2015-10-17 NOTE — Telephone Encounter (Signed)
Pt had f/u on 08/08/15, last refilled on 04/11/15 #90 with 1 additional refill, if approved I would need paper Rx to fax to Mail order pharmacy

## 2015-10-17 NOTE — Telephone Encounter (Signed)
Printed to fax  

## 2015-11-07 ENCOUNTER — Other Ambulatory Visit: Payer: Self-pay | Admitting: Family Medicine

## 2015-12-29 ENCOUNTER — Ambulatory Visit (INDEPENDENT_AMBULATORY_CARE_PROVIDER_SITE_OTHER)
Admission: RE | Admit: 2015-12-29 | Discharge: 2015-12-29 | Disposition: A | Discharge status: Home / Self Care | Payer: Commercial Managed Care - HMO | Source: Ambulatory Visit | Attending: Primary Care | Admitting: Primary Care

## 2015-12-29 ENCOUNTER — Ambulatory Visit (INDEPENDENT_AMBULATORY_CARE_PROVIDER_SITE_OTHER): Payer: Commercial Managed Care - HMO | Admitting: Primary Care

## 2015-12-29 ENCOUNTER — Encounter: Payer: Self-pay | Admitting: Primary Care

## 2015-12-29 VITALS — BP 122/70 | HR 74 | Temp 97.9°F | Ht 64.5 in | Wt 120.8 lb

## 2015-12-29 DIAGNOSIS — M546 Pain in thoracic spine: Secondary | ICD-10-CM | POA: Diagnosis not present

## 2015-12-29 MED ORDER — TIZANIDINE HCL 2 MG PO TABS: 2.0000 mg | ORAL_TABLET | Freq: Three times a day (TID) | ORAL | Status: DC | PRN

## 2015-12-29 NOTE — Progress Notes (Signed)
Pre visit review using our clinic review tool, if applicable. No additional management support is needed unless otherwise documented below in the visit note. 

## 2015-12-29 NOTE — Progress Notes (Signed)
Subjective:    Patient ID: [name] [name], female    DOB: 06-29-1946, 70 y.o.   MRN: [id number]  HPI  [name] [name] is a 70 year old female with a history of osteoarthritis who presents today with a chief complaint of back pain. Her pain is located to the thoracic back (around T9). She has a history of back pain for years but noticed her pain become worse 1 week ago. She describes her pain as a tightness and achy pain.   Denies recent injury/trauma, numbness/tingling, any unusual activity. She's taken Advil and has been using a pillow to support her back with mild improvement in her pain. 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   Review of Systems  Musculoskeletal: Positive for myalgias and back pain.  Neurological: Negative for numbness.       Past Medical History  Diagnosis Date  . Depression   . Hypothyroidism   . Osteoarthritis   . Osteoporosis   . Tobacco abuse     past; quit 09  . Sleep disorder   . Shingles     arm 1/12     Social History   Social History  . Marital Status: Married    Spouse Name: N/A  . Number of Children: N/A  . Years of Education: N/A   Occupational History  . Not on file.   Social History Main Topics  . Smoking status: Former Research scientist (life sciences)  . Smokeless tobacco: Never Used     Comment: Quit 10/09  . Alcohol Use: No  . Drug Use: No  . Sexual Activity: Not on file   Other Topics Concern  . Not on file   Social History Narrative   Marital Status: widowed.    3 children   Lost husband to throat cancer in 8/16 .     Past Surgical History  Procedure Laterality Date  . Hyerectomy- cervical ca cells    . Left wrist fracture      surgery  . Bunions  06/1998  . Hashimoto  02/1997  . Dexa  12/1996  . Biopsy thyroid      pos neoplasm, surgery 09/2006  . Cxr-copd, ts partial compression fracture  12/2006  . Right thyroid lobectomy, goiter,  thyroiditis  12/2006  . Thyroidectomy  11/2009  . Stress fractures foot      Family History  Problem Relation Age of Onset  . Diabetes Mother   . Coronary artery disease Mother   . Stroke Father   . Hypertension Father     Allergies  Allergen Reactions  . Fosamax [Alendronate Sodium]     Dysphagia and heartburn     Current Outpatient Prescriptions on File Prior to Visit  Medication Sig Dispense Refill  . albuterol (PROVENTIL HFA;VENTOLIN HFA) 108 (90 BASE) MCG/ACT inhaler Inhale 2 puffs into the lungs every 4 (four) hours as needed for wheezing. 1 Inhaler 11  . atorvastatin (LIPITOR) 40 MG tablet TAKE 1 TABLET EVERY DAY 90 tablet 2  . Calcium Carbonate-Vit D-Min (CALCIUM 1200 PO) Take by mouth daily.      . fluticasone (VERAMYST) 27.5 MCG/SPRAY nasal spray Place 2 sprays into the nose daily. 30 g 11  . ibuprofen (ADVIL,MOTRIN) 800 MG tablet Take one by mouth three times a day for 5 days     . levothyroxine (LEVOTHROID) 50 MCG tablet Take 1 tablet (50  mcg total) by mouth daily before breakfast. 14 tablet 0  . lisinopril (ZESTRIL) 10 MG tablet Take 0.5 tablets (5 mg total) by mouth daily. 7 tablet 0  . meloxicam (MOBIC) 15 MG tablet Take 1 tablet (15 mg total) by mouth daily. With food as needed for foot pain 30 tablet 5  . mupirocin ointment (BACTROBAN) 2 % Apply to affected area twice daily 15 g 1  . nortriptyline (PAMELOR) 25 MG capsule TAKE 1 CAPSULE AT BEDTIME WITH A 75MG  CAPSULE 90 capsule 2  . nortriptyline (PAMELOR) 75 MG capsule TAKE 1 CAPSULE AT BEDTIME 90 capsule 2  . temazepam (RESTORIL) 30 MG capsule TAKE 1 CAPSULE AT BEDTIME 90 capsule 1  . valACYclovir (VALTREX) 1000 MG tablet Take 2 pills by mouth twice daily for one day as needed for cold sore 4 tablet 5   No current facility-administered medications on file prior to visit.    BP 122/70 mmHg  Pulse 74  Temp(Src) 97.9 F (36.6 C) (Oral)  Ht 5' 4.5" (1.638 m)  Wt 120 lb 12.8 oz (54.795 kg)  BMI 20.42 kg/m2   SpO2 95%    Objective:   Physical Exam  Constitutional: She appears well-nourished.  Cardiovascular: Normal rate and regular rhythm.   Pulmonary/Chest: Effort normal and breath sounds normal.  Musculoskeletal:       Thoracic back: She exhibits tenderness and pain. She exhibits normal range of motion, no swelling and no deformity.  Outward curvature to spine  Skin: Skin is warm and dry.          Assessment & Plan:  Muscle Strain/Spasm:  Chronic thoracic back pain that has been worse over the past 1 week. Exam today with tenderness to surrounding muscles around T8-9 upon palpation. No significant decrease in ROM. Suspect muscle strain as she does exert herself physically to help her friend. Will obtain xray to rule out arthritic changes or any other abnormality as she does have moderate kyphosis. RX for low dose Tizanidine PRN with drowsiness precautions. Also discussed application of heat and stretches. Follow up PRN.

## 2015-12-29 NOTE — Patient Instructions (Signed)
You may take Tizanidine 2 mg tablets as needed for muscle spasms. Take 1 tablet by mouth every 8 hours as needed for muscle spasms.   Apply heat to your back three to four times daily for 20 minutes at a time.  Complete stretching exercises to help reduce tension in your back muscles.  Complete xray(s) prior to leaving today. I will notify you of your results once received.  Please notify me if no improvement in 1-2 weeks.  It was a pleasure meeting you!  Back Pain, Adult Back pain is very common in adults.The cause of back pain is rarely dangerous and the pain often gets better over time.The cause of your back pain may not be known. Some common causes of back pain include:  Strain of the muscles or ligaments supporting the spine.  Wear and tear (degeneration) of the spinal disks.  Arthritis.  Direct injury to the back. For many people, back pain may return. Since back pain is rarely dangerous, most people can learn to manage this condition on their own. HOME CARE INSTRUCTIONS Watch your back pain for any changes. The following actions may help to lessen any discomfort you are feeling:  Remain active. It is stressful on your back to sit or stand in one place for long periods of time. Do not sit, drive, or stand in one place for more than 30 minutes at a time. Take short walks on even surfaces as soon as you are able.Try to increase the length of time you walk each day.  Exercise regularly as directed by your health care provider. Exercise helps your back heal faster. It also helps avoid future injury by keeping your muscles strong and flexible.  Do not stay in bed.Resting more than 1-2 days can delay your recovery.  Pay attention to your body when you bend and lift. The most comfortable positions are those that put less stress on your recovering back. Always use proper lifting techniques, including:  Bending your knees.  Keeping the load close to your body.  Avoiding  twisting.  Find a comfortable position to sleep. Use a firm mattress and lie on your side with your knees slightly bent. If you lie on your back, put a pillow under your knees.  Avoid feeling anxious or stressed.Stress increases muscle tension and can worsen back pain.It is important to recognize when you are anxious or stressed and learn ways to manage it, such as with exercise.  Take medicines only as directed by your health care provider. Over-the-counter medicines to reduce pain and inflammation are often the most helpful.Your health care provider may prescribe muscle relaxant drugs.These medicines help dull your pain so you can more quickly return to your normal activities and healthy exercise.  Apply ice to the injured area:  Put ice in a plastic bag.  Place a towel between your skin and the bag.  Leave the ice on for 20 minutes, 2-3 times a day for the first 2-3 days. After that, ice and heat may be alternated to reduce pain and spasms.  Maintain a healthy weight. Excess weight puts extra stress on your back and makes it difficult to maintain good posture. SEEK MEDICAL CARE IF:  You have pain that is not relieved with rest or medicine.  You have increasing pain going down into the legs or buttocks.  You have pain that does not improve in one week.  You have night pain.  You lose weight.  You have a fever or chills. SEEK  IMMEDIATE MEDICAL CARE IF:   You develop new bowel or bladder control problems.  You have unusual weakness or numbness in your arms or legs.  You develop nausea or vomiting.  You develop abdominal pain.  You feel faint.   This information is not intended to replace advice given to you by your health care provider. Make sure you discuss any questions you have with your health care provider.   Document Released: 08/23/2005 Document Revised: 09/13/2014 Document Reviewed: 12/25/2013 Elsevier Interactive Patient Education [employer].

## 2015-12-30 ENCOUNTER — Other Ambulatory Visit: Payer: Self-pay | Admitting: Primary Care

## 2015-12-30 DIAGNOSIS — IMO0002 Reserved for concepts with insufficient information to code with codable children: Secondary | ICD-10-CM

## 2016-01-01 ENCOUNTER — Other Ambulatory Visit: Payer: Self-pay | Admitting: Primary Care

## 2016-01-01 DIAGNOSIS — M546 Pain in thoracic spine: Secondary | ICD-10-CM

## 2016-01-02 ENCOUNTER — Other Ambulatory Visit (INDEPENDENT_AMBULATORY_CARE_PROVIDER_SITE_OTHER): Payer: Commercial Managed Care - HMO

## 2016-01-02 DIAGNOSIS — E890 Postprocedural hypothyroidism: Secondary | ICD-10-CM

## 2016-01-02 DIAGNOSIS — I10 Essential (primary) hypertension: Secondary | ICD-10-CM | POA: Diagnosis not present

## 2016-01-02 DIAGNOSIS — E7800 Pure hypercholesterolemia, unspecified: Secondary | ICD-10-CM | POA: Diagnosis not present

## 2016-01-02 LAB — CBC WITH DIFFERENTIAL/PLATELET
Basophils Absolute: 0 10*3/uL (ref 0.0–0.1)
Basophils Relative: 0.7 % (ref 0.0–3.0)
Eosinophils Absolute: 0.1 10*3/uL (ref 0.0–0.7)
Eosinophils Relative: 0.8 % (ref 0.0–5.0)
HCT: 38 % (ref 36.0–46.0)
Hemoglobin: 12.8 g/dL (ref 12.0–15.0)
Lymphocytes Relative: 34.4 % (ref 12.0–46.0)
Lymphs Abs: 2.2 10*3/uL (ref 0.7–4.0)
MCHC: 33.8 g/dL (ref 30.0–36.0)
MCV: 93.1 fl (ref 78.0–100.0)
Monocytes Absolute: 0.5 10*3/uL (ref 0.1–1.0)
Monocytes Relative: 7.7 % (ref 3.0–12.0)
Neutro Abs: 3.7 10*3/uL (ref 1.4–7.7)
Neutrophils Relative %: 56.4 % (ref 43.0–77.0)
Platelets: 292 10*3/uL (ref 150.0–400.0)
RBC: 4.08 Mil/uL (ref 3.87–5.11)
RDW: 14.8 % (ref 11.5–15.5)
WBC: 6.5 10*3/uL (ref 4.0–10.5)

## 2016-01-02 LAB — COMPREHENSIVE METABOLIC PANEL
ALT: 10 U/L (ref 0–35)
AST: 17 U/L (ref 0–37)
Albumin: 3.8 g/dL (ref 3.5–5.2)
Alkaline Phosphatase: 61 U/L (ref 39–117)
BUN: 10 mg/dL (ref 6–23)
CO2: 29 mEq/L (ref 19–32)
Calcium: 9.1 mg/dL (ref 8.4–10.5)
Chloride: 104 mEq/L (ref 96–112)
Creatinine, Ser: 0.64 mg/dL (ref 0.40–1.20)
GFR: 97.42 mL/min (ref >60.00)
Glucose, Bld: 99 mg/dL (ref 70–99)
Potassium: 4.1 mEq/L (ref 3.5–5.1)
Sodium: 139 mEq/L (ref 135–145)
Total Bilirubin: 0.3 mg/dL (ref 0.2–1.2)
Total Protein: 6.7 g/dL (ref 6.0–8.3)

## 2016-01-02 LAB — LIPID PANEL
Cholesterol: 227 mg/dL — ABNORMAL HIGH (ref 0–200)
HDL: 75.6 mg/dL (ref >39.00)
LDL Cholesterol: 138 mg/dL — ABNORMAL HIGH (ref 0–99)
NonHDL: 151.23
Total CHOL/HDL Ratio: 3
Triglycerides: 66 mg/dL (ref 0.0–149.0)
VLDL: 13.2 mg/dL (ref 0.0–40.0)

## 2016-01-02 LAB — TSH: TSH: 38.78 u[IU]/mL — ABNORMAL HIGH (ref 0.35–4.50)

## 2016-01-08 ENCOUNTER — Ambulatory Visit (HOSPITAL_COMMUNITY)
Admission: RE | Admit: 2016-01-08 | Discharge: 2016-01-08 | Disposition: A | Discharge status: Home / Self Care | Payer: Commercial Managed Care - HMO | Source: Ambulatory Visit | Attending: Primary Care | Admitting: Primary Care

## 2016-01-08 DIAGNOSIS — M4854XA Collapsed vertebra, not elsewhere classified, thoracic region, initial encounter for fracture: Secondary | ICD-10-CM | POA: Diagnosis not present

## 2016-01-08 DIAGNOSIS — S22070A Wedge compression fracture of T9-T10 vertebra, initial encounter for closed fracture: Secondary | ICD-10-CM | POA: Diagnosis not present

## 2016-01-08 DIAGNOSIS — IMO0002 Reserved for concepts with insufficient information to code with codable children: Secondary | ICD-10-CM

## 2016-01-08 MED ORDER — GADOBENATE DIMEGLUMINE 529 MG/ML IV SOLN
15.0000 mL | Freq: Once | INTRAVENOUS | Status: AC | PRN
  Administered 2016-01-08: 11 mL via INTRAVENOUS

## 2016-01-09 ENCOUNTER — Other Ambulatory Visit: Payer: Self-pay | Admitting: Primary Care

## 2016-01-09 DIAGNOSIS — M810 Age-related osteoporosis without current pathological fracture: Secondary | ICD-10-CM

## 2016-01-09 MED ORDER — CALCITONIN (SALMON) 200 UNIT/ACT NA SOLN: 1.0000 | Freq: Every day | NASAL | Status: DC

## 2016-01-13 ENCOUNTER — Telehealth: Payer: Self-pay

## 2016-01-13 MED ORDER — TRAMADOL HCL 50 MG PO TABS: 50.0000 mg | ORAL_TABLET | Freq: Three times a day (TID) | ORAL | Status: DC | PRN

## 2016-01-13 NOTE — Telephone Encounter (Signed)
Medication phoned to pharmacy.   Quantity was inaccurate, spoke with Dr. Glori Bickers who signified it should be #60 which was relayed to the pharmacy.  Patient was advised of comments below.

## 2016-01-13 NOTE — Telephone Encounter (Signed)
Please call in tramadol- warn her of potential sedation  Let me know later in the week if it is helping  Hold the tizanidine if she takes this  If it causes nausea or vomiting or severe dizziness stop it , and if it causes rapid heart beat or flushing also stop it This is similar to narcotics- but not quite as strong

## 2016-01-13 NOTE — Telephone Encounter (Signed)
Pt was seen 12/29/15 and fx to back; pt has f/u appt with Dr Glori Bickers on 01/19/16; pts back is no better, Tizanidine, ibuprofen, exercising and heat is not helping pain. Pt request pain med. Pt request [facility]. [facility] [address]

## 2016-01-19 ENCOUNTER — Ambulatory Visit (INDEPENDENT_AMBULATORY_CARE_PROVIDER_SITE_OTHER): Payer: Commercial Managed Care - HMO | Admitting: Family Medicine

## 2016-01-19 ENCOUNTER — Encounter: Payer: Self-pay | Admitting: Family Medicine

## 2016-01-19 VITALS — BP 130/70 | HR 73 | Temp 97.7°F | Ht 64.5 in | Wt 120.0 lb

## 2016-01-19 DIAGNOSIS — M810 Age-related osteoporosis without current pathological fracture: Secondary | ICD-10-CM

## 2016-01-19 DIAGNOSIS — E890 Postprocedural hypothyroidism: Secondary | ICD-10-CM

## 2016-01-19 DIAGNOSIS — S22000D Wedge compression fracture of unspecified thoracic vertebra, subsequent encounter for fracture with routine healing: Secondary | ICD-10-CM | POA: Diagnosis not present

## 2016-01-19 DIAGNOSIS — I10 Essential (primary) hypertension: Secondary | ICD-10-CM

## 2016-01-19 DIAGNOSIS — Z8781 Personal history of (healed) traumatic fracture: Secondary | ICD-10-CM | POA: Insufficient documentation

## 2016-01-19 DIAGNOSIS — E7800 Pure hypercholesterolemia, unspecified: Secondary | ICD-10-CM

## 2016-01-19 MED ORDER — RALOXIFENE HCL 60 MG PO TABS: 60.0000 mg | ORAL_TABLET | Freq: Every day | ORAL | Status: DC

## 2016-01-19 MED ORDER — LEVOTHYROXINE SODIUM 75 MCG PO TABS: 75.0000 ug | ORAL_TABLET | Freq: Every day | ORAL | Status: DC

## 2016-01-19 NOTE — Assessment & Plan Note (Signed)
In pt with OP- T9 comp fx This continues to be painful and she asks about what can be done  Continue tramadol as needed Continue miacalcin ns  Also add evista 60 mg daily (if her ins covers it)-disc poss side eff/will update  Stressed imp of ca and D Ref to ortho  ? If a brace would help  She does have significant kyphosis as well

## 2016-01-19 NOTE — Assessment & Plan Note (Signed)
TSH is very high Pt states she has missed some doses but in general takes regularly and away from food/ supplements and other medicines  Will inc dose to 75 mcg and enc compliance Re check tsh in 1 mo

## 2016-01-19 NOTE — Assessment & Plan Note (Signed)
Post menopausal Hx of fx and also smoking in the past  Most recently T9 comp fx Continue miacalcin and ca and D  Add evista if covered and tolerated (disc poss side eff and handout given) Intol of alendronate in the past  I would like to try prolia in the future if covered

## 2016-01-19 NOTE — Progress Notes (Signed)
Subjective:    Patient ID: [name] [name], female    DOB: [birth year], 70 y.o.   MRN: [id number]  HPI Here for f/u of chronic medical conditions  Last visit here - was dx with new T9 compression fracture Pt thinks it happened when she was trying to help a client get up  Still in a lot of pain- even with tramadol every 8 hours  Started on miacalcin  Hx of OP  Intol of bisphosphenates  Is interested in orthopedics   bp is stable today  No cp or palpitations or headaches or edema  No side effects to medicines  BP Readings from Last 3 Encounters:  01/19/16 130/70  12/29/15 122/70  08/08/15 135/80     Wt is stable with bmi of 20   Hypothyroid Lab Results  Component Value Date   TSH 38.78* 01/02/2016   thinks she missed one thyroid pill/ that was all  Feels really tired lately Skin is dry but no change in her hair  She takes levothyroxine - in am /not at the same time as other meds or vitamins or calcium  This is way up   Hyperlipidemia Lab Results  Component Value Date   CHOL 227* 01/02/2016   CHOL 199 06/28/2014   CHOL 233* 12/26/2013   Lab Results  Component Value Date   HDL 75.60 01/02/2016   HDL 77 06/28/2014   HDL 81.10 12/26/2013   Lab Results  Component Value Date   LDLCALC 138* 01/02/2016   LDLCALC 111* 06/28/2014   LDLCALC 139* 12/26/2013   Lab Results  Component Value Date   TRIG 66.0 01/02/2016   TRIG 55 06/28/2014   TRIG 64.0 12/26/2013   Lab Results  Component Value Date   CHOLHDL 3 01/02/2016   CHOLHDL 2.6 06/28/2014   CHOLHDL 3 12/26/2013   Lab Results  Component Value Date   LDLDIRECT 140.9 07/04/2013   LDLDIRECT 140.9 07/10/2012   LDLDIRECT 208.7 05/24/2012   on lipitor 40 mg  No missed doses  Diet has not changed  Does not eat fried foods No red meat or butter or cheese   (except for a steak infrequently)   Patient Active Problem List   Diagnosis Date Noted  . Compression fracture of thoracic vertebra ([place]) 01/19/2016  .  Grief reaction 08/08/2015  . Dysphagia 06/28/2014  . Left foot pain 07/04/2013  . Sebaceous cyst 01/08/2013  . Chronic foot pain 10/27/2011  . Preoperative examination 04/14/2011  . Low back pain 04/14/2011  . FEVER BLISTER 11/17/2010  . Essential hypertension 08/18/2009  . BUNION 03/04/2009  . Allergic rhinitis 01/10/2008  . TOBACCO USE, QUIT 02/10/2007  . GOITER, MULTINODULAR 02/09/2007  . Hypothyroidism 02/09/2007  . HYPERCHOLESTEROLEMIA 02/09/2007  . DEPRESSION 02/09/2007  . OSTEOARTHRITIS 02/09/2007  . Osteoporosis 02/09/2007  . Disturbance in sleep behavior 02/09/2007   Past Medical History  Diagnosis Date  . Depression   . Hypothyroidism   . Osteoarthritis   . Osteoporosis   . Tobacco abuse     past; quit 09  . Sleep disorder   . Shingles     arm 1/12   Past Surgical History  Procedure Laterality Date  . Hyerectomy- cervical ca cells    . Left wrist fracture      surgery  . Bunions  06/1998  . Hashimoto  02/1997  . Dexa  12/1996  . Biopsy thyroid      pos neoplasm, surgery 09/2006  . Cxr-copd, ts partial compression fracture  12/2006  . Right thyroid lobectomy, goiter, thyroiditis  12/2006  . Thyroidectomy  11/2009  . Stress fractures foot     Social History  Substance Use Topics  . Smoking status: Former Research scientist (life sciences)  . Smokeless tobacco: Never Used     Comment: Quit 10/09  . Alcohol Use: No   Family History  Problem Relation Age of Onset  . Diabetes Mother   . Coronary artery disease Mother   . Stroke Father   . Hypertension Father    Allergies  Allergen Reactions  . Fosamax [Alendronate Sodium]     Dysphagia and heartburn    Current Outpatient Prescriptions on File Prior to Visit  Medication Sig Dispense Refill  . albuterol (PROVENTIL HFA;VENTOLIN HFA) 108 (90 BASE) MCG/ACT inhaler Inhale 2 puffs into the lungs every 4 (four) hours as needed for wheezing. 1 Inhaler 11  . atorvastatin (LIPITOR) 40 MG tablet TAKE 1 TABLET EVERY DAY 90 tablet 2  .  calcitonin, salmon, (MIACALCIN) 200 UNIT/ACT nasal spray Place 1 spray into alternate nostrils daily. 3.7 mL 2  . Calcium Carbonate-Vit D-Min (CALCIUM 1200 PO) Take by mouth daily.      . fluticasone (VERAMYST) 27.5 MCG/SPRAY nasal spray Place 2 sprays into the nose daily. 30 g 11  . ibuprofen (ADVIL,MOTRIN) 800 MG tablet Take one by mouth three times a day for 5 days     . lisinopril (ZESTRIL) 10 MG tablet Take 0.5 tablets (5 mg total) by mouth daily. 7 tablet 0  . meloxicam (MOBIC) 15 MG tablet Take 1 tablet (15 mg total) by mouth daily. With food as needed for foot pain 30 tablet 5  . mupirocin ointment (BACTROBAN) 2 % Apply to affected area twice daily 15 g 1  . nortriptyline (PAMELOR) 25 MG capsule TAKE 1 CAPSULE AT BEDTIME WITH A 75MG  CAPSULE 90 capsule 2  . nortriptyline (PAMELOR) 75 MG capsule TAKE 1 CAPSULE AT BEDTIME 90 capsule 2  . temazepam (RESTORIL) 30 MG capsule TAKE 1 CAPSULE AT BEDTIME 90 capsule 1  . tiZANidine (ZANAFLEX) 2 MG tablet Take 1 tablet (2 mg total) by mouth every 8 (eight) hours as needed for muscle spasms. 30 tablet 0  . traMADol (ULTRAM) 50 MG tablet Take 1 tablet (50 mg total) by mouth every 8 (eight) hours as needed for moderate pain or severe pain (careful of sedation and take with food). 3060 tablet 1  . valACYclovir (VALTREX) 1000 MG tablet Take 2 pills by mouth twice daily for one day as needed for cold sore 4 tablet 5   No current facility-administered medications on file prior to visit.    Review of Systems Review of Systems  Constitutional: Negative for fever, appetite change, and unexpected weight change. pos for fatigue  Eyes: Negative for pain and visual disturbance.  Respiratory: Negative for cough and shortness of breath.   Cardiovascular: Negative for cp or palpitations    Gastrointestinal: Negative for nausea, diarrhea and constipation.  Genitourinary: Negative for urgency and frequency.  Skin: Negative for pallor or rash   MSK pos for mid back  pain Neurological: Negative for weakness, light-headedness, numbness and headaches.  Hematological: Negative for adenopathy. Does not bruise/bleed easily.  Psychiatric/Behavioral: Negative for dysphoric mood. The patient is not nervous/anxious.         Objective:   Physical Exam  Constitutional: She appears well-developed and well-nourished. No distress.  HENT:  Head: Normocephalic and atraumatic.  Mouth/Throat: Oropharynx is clear and moist.  Eyes: Conjunctivae and EOM are normal.  Pupils are equal, round, and reactive to light.  Neck: Normal range of motion. Neck supple. No JVD present. Carotid bruit is not present. No thyromegaly present.  Post op changes to thyroid area   Cardiovascular: Normal rate, regular rhythm, normal heart sounds and intact distal pulses.  Exam reveals no gallop.   Pulmonary/Chest: Effort normal and breath sounds normal. No respiratory distress. She has no wheezes. She has no rales.  No crackles  Abdominal: Soft. Bowel sounds are normal. She exhibits no distension, no abdominal bruit and no mass. There is no tenderness.  Musculoskeletal: She exhibits no edema or tenderness.  Tender over T spine-particularly T9 area  Kyphosis noted   Lymphadenopathy:    She has no cervical adenopathy.  Neurological: She is alert. She has normal reflexes.  No tremor   Skin: Skin is warm and dry. No rash noted. No pallor.  Psychiatric: She has a normal mood and affect.          Assessment & Plan:   Problem List Items Addressed This Visit      Cardiovascular and Mediastinum   Essential hypertension    bp in fair control at this time  BP Readings from Last 1 Encounters:  01/19/16 130/70   No changes needed Disc lifstyle change with low sodium diet and exercise   Labs reviewed         Endocrine   Hypothyroidism - Primary    TSH is very high Pt states she has missed some doses but in general takes regularly and away from food/ supplements and other medicines    Will inc dose to 75 mcg and enc compliance Re check tsh in 1 mo       Relevant Medications   levothyroxine (SYNTHROID, LEVOTHROID) 75 MCG tablet     Musculoskeletal and Integument   Compression fracture of thoracic vertebra ([facility])    In pt with OP- T9 comp fx This continues to be painful and she asks about what can be done  Continue tramadol as needed Continue miacalcin ns  Also add evista 60 mg daily (if her ins covers it)-disc poss side eff/will update  Stressed imp of ca and D Ref to ortho  ? If a brace would help  She does have significant kyphosis as well      Relevant Orders   Ambulatory referral to Orthopedic Surgery   Osteoporosis    Post menopausal Hx of fx and also smoking in the past  Most recently T9 comp fx Continue miacalcin and ca and D  Add evista if covered and tolerated (disc poss side eff and handout given) Intol of alendronate in the past  I would like to try prolia in the future if covered       Relevant Medications   raloxifene (EVISTA) 60 MG tablet     Other   HYPERCHOLESTEROLEMIA    On lipitor Disc goals for lipids and reasons to control them Rev labs with pt Rev low sat fat diet in detail LDL is up a bit- rev diet again Enc compliance with med also

## 2016-01-19 NOTE — Progress Notes (Signed)
Pre visit review using our clinic review tool, if applicable. No additional management support is needed unless otherwise documented below in the visit note. 

## 2016-01-19 NOTE — Assessment & Plan Note (Signed)
bp in fair control at this time  BP Readings from Last 1 Encounters:  01/19/16 130/70   No changes needed Disc lifstyle change with low sodium diet and exercise   Labs reviewed

## 2016-01-19 NOTE — Assessment & Plan Note (Signed)
On lipitor Disc goals for lipids and reasons to control them Rev labs with pt Rev low sat fat diet in detail LDL is up a bit- rev diet again Enc compliance with med also

## 2016-01-19 NOTE — Patient Instructions (Addendum)
Increase your levothyroxine (thyroid medicine) from 50 to 75 mcg daily  Take in the am - 20 minutes before breakfast or other medicines or vitamins  Stop at check out for orthopedic referral for back  Also lab in 1 month for thyroid  Fill px for evista - this is daily for osteoporosis - if any side effects or problems let me know   Schedule your physical also for the fall

## 2016-01-20 ENCOUNTER — Other Ambulatory Visit: Payer: Self-pay | Admitting: Family Medicine

## 2016-01-21 DIAGNOSIS — S22070A Wedge compression fracture of T9-T10 vertebra, initial encounter for closed fracture: Secondary | ICD-10-CM | POA: Diagnosis not present

## 2016-02-13 ENCOUNTER — Ambulatory Visit: Payer: Self-pay | Admitting: Specialist

## 2016-02-13 DIAGNOSIS — S22070A Wedge compression fracture of T9-T10 vertebra, initial encounter for closed fracture: Secondary | ICD-10-CM | POA: Diagnosis not present

## 2016-04-05 DIAGNOSIS — S22080A Wedge compression fracture of T11-T12 vertebra, initial encounter for closed fracture: Secondary | ICD-10-CM | POA: Diagnosis not present

## 2016-04-05 DIAGNOSIS — S22070A Wedge compression fracture of T9-T10 vertebra, initial encounter for closed fracture: Secondary | ICD-10-CM | POA: Diagnosis not present

## 2016-04-26 ENCOUNTER — Encounter: Payer: Self-pay | Admitting: Family Medicine

## 2016-04-26 ENCOUNTER — Ambulatory Visit (INDEPENDENT_AMBULATORY_CARE_PROVIDER_SITE_OTHER): Payer: Commercial Managed Care - HMO | Admitting: Family Medicine

## 2016-04-26 DIAGNOSIS — R059 Cough, unspecified: Secondary | ICD-10-CM | POA: Insufficient documentation

## 2016-04-26 DIAGNOSIS — R05 Cough: Secondary | ICD-10-CM

## 2016-04-26 MED ORDER — LORATADINE 10 MG PO TABS: 10.0000 mg | ORAL_TABLET | Freq: Every day | ORAL | Status: DC

## 2016-04-26 NOTE — Assessment & Plan Note (Signed)
Nontoxic. Still well-appearing. Okay for outpatient follow-up.  Discussed with patient about differential diagnosis. Most likely either allergies or a benign viral infection. Not likely to benefit from antibiotics at this point. Can try Claritin. Update [place] as needed. She agrees. See after visit summary.

## 2016-04-26 NOTE — Progress Notes (Signed)
duration of symptoms: about 2 days ago Rhinorrhea: yes Congestion: no ear pain: no  sore throat: yes cough: some, some more than typical.  No sputum.  Myalgias: no Some nausea and fatigue.   No fevers.   No facial pain.  Main issues: ST, cough, rhinorrhea, some HA.   No sick contacts.   Per HPI unless specifically indicated in ROS section   Meds, vitals, and allergies reviewed.   GEN: nad, alert and oriented HEENT: mucous membranes moist, TM w/o erythema, nasal epithelium injected, OP with cobblestoning NECK: supple w/o LA CV: rrr. PULM: ctab, no inc wob ABD: soft, +bs EXT: no edema

## 2016-04-26 NOTE — Patient Instructions (Signed)
Add on claritin 10mg  a day.  That should help.  Update [place] as needed.  Take care.  Glad to see you.

## 2016-04-26 NOTE — Progress Notes (Signed)
Pre visit review using our clinic review tool, if applicable. No additional management support is needed unless otherwise documented below in the visit note. 

## 2016-05-10 ENCOUNTER — Other Ambulatory Visit: Payer: Self-pay | Admitting: Family Medicine

## 2016-05-11 MED ORDER — TEMAZEPAM 30 MG PO CAPS: 30.0000 mg | ORAL_CAPSULE | Freq: Every day | ORAL | 1 refills | Status: DC

## 2016-05-11 NOTE — Telephone Encounter (Signed)
Last refilled on 10/17/15 #90 caps with 1 additional refill, pt has AWV and CPE scheduled 07/09/16, please advise

## 2016-05-11 NOTE — Telephone Encounter (Signed)
Rx faxed to [facility] 

## 2016-05-11 NOTE — Telephone Encounter (Signed)
I realized it's a mail order pharmacy so Rx has to be printed to fax to them, please print Rx (I can't) and I will fax it to [facility]

## 2016-05-11 NOTE — Telephone Encounter (Signed)
Px written for call in   

## 2016-05-11 NOTE — Telephone Encounter (Signed)
Printed to fax  

## 2016-06-04 ENCOUNTER — Ambulatory Visit (INDEPENDENT_AMBULATORY_CARE_PROVIDER_SITE_OTHER): Payer: Commercial Managed Care - HMO | Admitting: Family Medicine

## 2016-06-04 ENCOUNTER — Ambulatory Visit (INDEPENDENT_AMBULATORY_CARE_PROVIDER_SITE_OTHER)
Admission: RE | Admit: 2016-06-04 | Discharge: 2016-06-04 | Disposition: A | Discharge status: Home / Self Care | Payer: Commercial Managed Care - HMO | Source: Ambulatory Visit | Attending: Family Medicine | Admitting: Family Medicine

## 2016-06-04 ENCOUNTER — Encounter: Payer: Self-pay | Admitting: Family Medicine

## 2016-06-04 VITALS — BP 112/70 | HR 75 | Temp 98.0°F | Ht 64.5 in | Wt 108.0 lb

## 2016-06-04 DIAGNOSIS — R634 Abnormal weight loss: Secondary | ICD-10-CM

## 2016-06-04 DIAGNOSIS — Z87891 Personal history of nicotine dependence: Secondary | ICD-10-CM

## 2016-06-04 DIAGNOSIS — R1013 Epigastric pain: Secondary | ICD-10-CM | POA: Diagnosis not present

## 2016-06-04 DIAGNOSIS — E890 Postprocedural hypothyroidism: Secondary | ICD-10-CM

## 2016-06-04 DIAGNOSIS — I10 Essential (primary) hypertension: Secondary | ICD-10-CM | POA: Diagnosis not present

## 2016-06-04 DIAGNOSIS — Z23 Encounter for immunization: Secondary | ICD-10-CM

## 2016-06-04 LAB — CBC WITH DIFFERENTIAL/PLATELET
Basophils Absolute: 0 10*3/uL (ref 0.0–0.1)
Basophils Relative: 0.4 % (ref 0.0–3.0)
Eosinophils Absolute: 0.1 10*3/uL (ref 0.0–0.7)
Eosinophils Relative: 1.2 % (ref 0.0–5.0)
HCT: 38.7 % (ref 36.0–46.0)
Hemoglobin: 12.7 g/dL (ref 12.0–15.0)
Lymphocytes Relative: 38.3 % (ref 12.0–46.0)
Lymphs Abs: 2.6 10*3/uL (ref 0.7–4.0)
MCHC: 32.8 g/dL (ref 30.0–36.0)
MCV: 93.8 fl (ref 78.0–100.0)
Monocytes Absolute: 0.4 10*3/uL (ref 0.1–1.0)
Monocytes Relative: 6.6 % (ref 3.0–12.0)
Neutro Abs: 3.7 10*3/uL (ref 1.4–7.7)
Neutrophils Relative %: 53.5 % (ref 43.0–77.0)
Platelets: 206 10*3/uL (ref 150.0–400.0)
RBC: 4.13 Mil/uL (ref 3.87–5.11)
RDW: 15.4 % (ref 11.5–15.5)
WBC: 6.9 10*3/uL (ref 4.0–10.5)

## 2016-06-04 LAB — COMPREHENSIVE METABOLIC PANEL
ALT: 14 U/L (ref 0–35)
AST: 22 U/L (ref 0–37)
Albumin: 3.6 g/dL (ref 3.5–5.2)
Alkaline Phosphatase: 65 U/L (ref 39–117)
BUN: 13 mg/dL (ref 6–23)
CO2: 31 mEq/L (ref 19–32)
Calcium: 8.5 mg/dL (ref 8.4–10.5)
Chloride: 105 mEq/L (ref 96–112)
Creatinine, Ser: 0.77 mg/dL (ref 0.40–1.20)
GFR: 78.6 mL/min (ref >60.00)
Glucose, Bld: 88 mg/dL (ref 70–99)
Potassium: 4.2 mEq/L (ref 3.5–5.1)
Sodium: 140 mEq/L (ref 135–145)
Total Bilirubin: 0.3 mg/dL (ref 0.2–1.2)
Total Protein: 6.8 g/dL (ref 6.0–8.3)

## 2016-06-04 LAB — TSH: TSH: 31.56 u[IU]/mL — ABNORMAL HIGH (ref 0.35–4.50)

## 2016-06-04 NOTE — Assessment & Plan Note (Addendum)
Will tx with ranitidine 150 bid Pt has OP- would like to avoid ppi if poss Disc diet  Adv to hold nsaid (meloxicam) if possible  Also wt loss of ? Cause Has not had a colonoscopy   F/u 1-2 wk  Will disc GI screening and see how she is doing

## 2016-06-04 NOTE — Progress Notes (Signed)
Pre visit review using our clinic review tool, if applicable. No additional management support is needed unless otherwise documented below in the visit note. 

## 2016-06-04 NOTE — Assessment & Plan Note (Signed)
Missed last tsh draw after changing dose No missed doses Lab Results  Component Value Date   TSH 38.78 (H) 01/02/2016    Pending today Pt has lost significant weight-  ? If related

## 2016-06-04 NOTE — Assessment & Plan Note (Signed)
In a slim former smoker Down 12 lb since April Has dyspepsia symptoms-but states this has not aff her calorie intake  cxr today Lab today  Disc impt of regular meals with protien

## 2016-06-04 NOTE — Assessment & Plan Note (Signed)
bp in fair control at this time  BP Readings from Last 1 Encounters:  06/04/16 112/70   No changes needed Disc lifstyle change with low sodium diet and exercise

## 2016-06-04 NOTE — Assessment & Plan Note (Addendum)
Former smoker with unexplained wt loss  cxr today

## 2016-06-04 NOTE — Progress Notes (Signed)
Subjective:    Patient ID: [name] [name], female    DOB: 18-Dec-1945, 70 y.o.   MRN: [id number]  HPI Here for a GI issues- started around 1 mo ago   Stomach "churns" all the time  Feels nauseated frequently -no vomiting  occ sharp pains - in lower abdomen  Has heartburn sometimes -maybe 3 times per week   Spicy food makes it worse   Takes meloxicam daily - usually eats when she takes  No nsaids or asa otc   Sometimes she has loose bm -not always Has bm 1-2 times per day No blood in stool  Sometimes is darker than others  She does take vitamins with iron   Lab Results  Component Value Date   WBC 6.5 01/02/2016   HGB 12.8 01/02/2016   HCT 38.0 01/02/2016   MCV 93.1 01/02/2016   PLT 292.0 01/02/2016     Lab Results  Component Value Date   TSH 38.78 (H) 01/02/2016   changed her medicine  She has been taking it     Wt Readings from Last 3 Encounters:  06/04/16 108 lb (49 kg)  04/26/16 110 lb 8 oz (50.1 kg)  01/19/16 120 lb (54.4 kg)  has lost a fair amount of weight  Not eating any differently  Not more active than she was  bmi is 18.2   BP Readings from Last 3 Encounters:  06/04/16 112/70  04/26/16 110/60  01/19/16 130/70    Patient Active Problem List   Diagnosis Date Noted  . Dyspepsia 06/04/2016  . Loss of weight 06/04/2016  . Cough 04/26/2016  . Compression fracture of thoracic vertebra ([place]) 01/19/2016  . Grief reaction 08/08/2015  . Dysphagia 06/28/2014  . Left foot pain 07/04/2013  . Sebaceous cyst 01/08/2013  . Chronic foot pain 10/27/2011  . Preoperative examination 04/14/2011  . Low back pain 04/14/2011  . FEVER BLISTER 11/17/2010  . Essential hypertension 08/18/2009  . BUNION 03/04/2009  . Allergic rhinitis 01/10/2008  . TOBACCO USE, QUIT 02/10/2007  . GOITER, MULTINODULAR 02/09/2007  . Hypothyroidism 02/09/2007  . HYPERCHOLESTEROLEMIA 02/09/2007  . DEPRESSION 02/09/2007  . OSTEOARTHRITIS 02/09/2007  . Osteoporosis 02/09/2007  .  Disturbance in sleep behavior 02/09/2007   Past Medical History:  Diagnosis Date  . Depression   . Hypothyroidism   . Osteoarthritis   . Osteoporosis   . Shingles    arm 1/12  . Sleep disorder   . Tobacco abuse    past; quit 09   Past Surgical History:  Procedure Laterality Date  . BIOPSY THYROID     pos neoplasm, surgery 09/2006  . bunions  06/1998  . CXR-copd, ts partial compression fracture  12/2006  . dexa  12/1996  . hashimoto  02/1997  . hyerectomy- cervical ca cells    . left wrist fracture     surgery  . right thyroid lobectomy, goiter, thyroiditis  12/2006  . stress fractures foot    . THYROIDECTOMY  11/2009   Social History  Substance Use Topics  . Smoking status: Former Research scientist (life sciences)  . Smokeless tobacco: Never Used     Comment: Quit 10/09  . Alcohol use No   Family History  Problem Relation Age of Onset  . Diabetes Mother   . Coronary artery disease Mother   . Stroke Father   . Hypertension Father    Allergies  Allergen Reactions  . Fosamax [Alendronate Sodium]     Dysphagia and heartburn    Current Outpatient Prescriptions  on File Prior to Visit  Medication Sig Dispense Refill  . albuterol (PROVENTIL HFA;VENTOLIN HFA) 108 (90 BASE) MCG/ACT inhaler Inhale 2 puffs into the lungs every 4 (four) hours as needed for wheezing. 1 Inhaler 11  . atorvastatin (LIPITOR) 40 MG tablet TAKE 1 TABLET EVERY DAY 90 tablet 2  . calcitonin, salmon, (MIACALCIN) 200 UNIT/ACT nasal spray Place 1 spray into alternate nostrils daily. 3.7 mL 2  . Calcium Carbonate-Vit D-Min (CALCIUM 1200 PO) Take by mouth daily.      . fluticasone (VERAMYST) 27.5 MCG/SPRAY nasal spray Place 2 sprays into the nose daily. 30 g 11  . ibuprofen (ADVIL,MOTRIN) 800 MG tablet Take one by mouth three times a day for 5 days     . levothyroxine (SYNTHROID, LEVOTHROID) 50 MCG tablet TAKE 1 TABLET DAILY BEFORE BREAKFAST. 90 tablet 3  . levothyroxine (SYNTHROID, LEVOTHROID) 75 MCG tablet Take 1 tablet (75 mcg  total) by mouth daily. 30 tablet 3  . lisinopril (PRINIVIL,ZESTRIL) 10 MG tablet TAKE 1/2 TABLET EVERY DAY 45 tablet 3  . loratadine (CLARITIN) 10 MG tablet Take 1 tablet (10 mg total) by mouth daily.    . meloxicam (MOBIC) 15 MG tablet Take 1 tablet (15 mg total) by mouth daily. With food as needed for foot pain 30 tablet 5  . mupirocin ointment (BACTROBAN) 2 % Apply to affected area twice daily 15 g 1  . nortriptyline (PAMELOR) 25 MG capsule TAKE 1 CAPSULE AT BEDTIME WITH A 75MG  CAPSULE 90 capsule 2  . nortriptyline (PAMELOR) 75 MG capsule TAKE 1 CAPSULE AT BEDTIME 90 capsule 2  . raloxifene (EVISTA) 60 MG tablet Take 1 tablet (60 mg total) by mouth daily. 30 tablet 11  . temazepam (RESTORIL) 30 MG capsule Take 1 capsule (30 mg total) by mouth at bedtime. 90 capsule 1  . tiZANidine (ZANAFLEX) 2 MG tablet Take 1 tablet (2 mg total) by mouth every 8 (eight) hours as needed for muscle spasms. 30 tablet 0  . traMADol (ULTRAM) 50 MG tablet Take 1 tablet (50 mg total) by mouth every 8 (eight) hours as needed for moderate pain or severe pain (careful of sedation and take with food). 3060 tablet 1  . valACYclovir (VALTREX) 1000 MG tablet Take 2 pills by mouth twice daily for one day as needed for cold sore 4 tablet 5   No current facility-administered medications on file prior to visit.     Review of Systems Review of Systems  Constitutional: Negative for fever, appetite change, fatigue and unexpected weight change.  Eyes: Negative for pain and visual disturbance.  ENT neg for sinus symptoms or st Respiratory: Negative for cough and shortness of breath.   Cardiovascular: Negative for cp or palpitations    Gastrointestinal: Negative for constipation. neg for blood in stool or dysphagia  Genitourinary: Negative for urgency and frequency. neg for hematuria  Skin: Negative for pallor or rash   Neurological: Negative for weakness, light-headedness, numbness and headaches.  Hematological: Negative for  adenopathy. Does not bruise/bleed easily.  Psychiatric/Behavioral: Negative for dysphoric mood. The patient is not nervous/anxious.         Objective:   Physical Exam  Constitutional: She appears well-developed and well-nourished. No distress.  Slim/underweight and well appearing   HENT:  Head: Normocephalic and atraumatic.  Nose: Nose normal.  Mouth/Throat: Oropharynx is clear and moist.  Eyes: Conjunctivae and EOM are normal. Pupils are equal, round, and reactive to light. No scleral icterus.  Neck: Normal range of motion. Neck  supple.  S/p thyroidectomy  Cardiovascular: Normal rate, regular rhythm, normal heart sounds and intact distal pulses.   No murmur heard. Pulmonary/Chest: Effort normal and breath sounds normal. No respiratory distress. She has no wheezes. She has no rales. She exhibits no tenderness.  bs are slightly distant Good air exch  Abdominal: Soft. Bowel sounds are normal. She exhibits no distension and no mass. There is no hepatosplenomegaly. There is tenderness in the epigastric area and periumbilical area. There is no rigidity, no rebound, no guarding, no CVA tenderness, no tenderness at McBurney's point and negative Murphy's sign.  Musculoskeletal: She exhibits no edema.  Lymphadenopathy:    She has no cervical adenopathy.  Neurological: She is alert. No cranial nerve deficit. She exhibits normal muscle tone. Coordination normal.  Skin: Skin is warm and dry. No erythema. No pallor.  No icterus or pallor  Psychiatric: She has a normal mood and affect.          Assessment & Plan:   Problem List Items Addressed This Visit      Cardiovascular and Mediastinum   Essential hypertension    bp in fair control at this time  BP Readings from Last 1 Encounters:  06/04/16 112/70   No changes needed Disc lifstyle change with low sodium diet and exercise        Relevant Orders   TSH   CBC with Differential/Platelet   Comprehensive metabolic panel      Endocrine   Hypothyroidism - Primary    Missed last tsh draw after changing dose No missed doses Lab Results  Component Value Date   TSH 38.78 (H) 01/02/2016    Pending today Pt has lost significant weight-  ? If related        Other   Dyspepsia    Will tx with ranitidine 150 bid Pt has OP- would like to avoid ppi if poss Disc diet  Adv to hold nsaid (meloxicam) if possible  Also wt loss of ? Cause Has not had a colonoscopy   F/u 1-2 wk  Will disc GI screening and see how she is doing       Relevant Orders   CBC with Differential/Platelet   Loss of weight    In a slim former smoker Down 12 lb since April Has dyspepsia symptoms-but states this has not aff her calorie intake  cxr today Lab today  Disc impt of regular meals with protien      Relevant Orders   TSH   CBC with Differential/Platelet   Comprehensive metabolic panel   DG Chest 2 View   TOBACCO USE, QUIT    Former smoker with unexplained wt loss  cxr today      Relevant Orders   DG Chest 2 View    Other Visit Diagnoses    Need for influenza vaccination       Relevant Orders   Flu Vaccine QUAD 36+ mos IM (Completed)

## 2016-06-04 NOTE — Patient Instructions (Signed)
I think you have an acid problem causing your GI symptoms  Take zantac 150 mg twice daily -in am before breakfast and then evening (12 hours later if possible)  Avoid spicy food  Hold your meloxicam if you can- if you must take it - always take with a full meal  Lab and chest xray today for weight loss  Try to eat regular meals Follow up in 1-2 weeks

## 2016-06-08 ENCOUNTER — Telehealth: Payer: Self-pay | Admitting: Family Medicine

## 2016-06-08 MED ORDER — LEVOTHYROXINE SODIUM 100 MCG PO TABS: 100.0000 ug | ORAL_TABLET | Freq: Every day | ORAL | 3 refills | Status: DC

## 2016-06-08 NOTE — Telephone Encounter (Signed)
I changed dose to 100 mcg  Please call it in -not mail order because we may have to change it again after next check  F/u as planned  If any problems let me know  Unsure which pharmacy she will want to use  Do take it in am before bkfast

## 2016-06-08 NOTE — Telephone Encounter (Signed)
-----   Message from [place], [place] sent at 06/08/2016 12:07 PM EDT ----- Pt called back she takes 68mcg of synthroid, pt uses [facility] [address] For her pharmacy, pt said she hasn't been taking med 1st thing before a meal, she said she sometime would forget so she would take it with breakfast

## 2016-06-08 NOTE — Telephone Encounter (Signed)
Addressed through result notes  

## 2016-06-08 NOTE — Telephone Encounter (Signed)
° ° ° °

## 2016-06-09 MED ORDER — LEVOTHYROXINE SODIUM 100 MCG PO TABS: 100.0000 ug | ORAL_TABLET | Freq: Every day | ORAL | 3 refills | Status: DC

## 2016-06-09 NOTE — Telephone Encounter (Signed)
Pt notified Rx sent to local pharmacy and advised of Dr. Marliss Coots comments

## 2016-06-15 ENCOUNTER — Encounter: Payer: Self-pay | Admitting: Family Medicine

## 2016-06-15 ENCOUNTER — Ambulatory Visit (INDEPENDENT_AMBULATORY_CARE_PROVIDER_SITE_OTHER): Payer: Commercial Managed Care - HMO | Admitting: Family Medicine

## 2016-06-15 ENCOUNTER — Ambulatory Visit (INDEPENDENT_AMBULATORY_CARE_PROVIDER_SITE_OTHER)
Admission: RE | Admit: 2016-06-15 | Discharge: 2016-06-15 | Disposition: A | Discharge status: Home / Self Care | Payer: Commercial Managed Care - HMO | Source: Ambulatory Visit | Attending: Family Medicine | Admitting: Family Medicine

## 2016-06-15 VITALS — BP 98/70 | HR 74 | Temp 97.9°F | Wt 110.0 lb

## 2016-06-15 DIAGNOSIS — H6123 Impacted cerumen, bilateral: Secondary | ICD-10-CM

## 2016-06-15 DIAGNOSIS — H6120 Impacted cerumen, unspecified ear: Secondary | ICD-10-CM | POA: Insufficient documentation

## 2016-06-15 DIAGNOSIS — M8000XS Age-related osteoporosis with current pathological fracture, unspecified site, sequela: Secondary | ICD-10-CM

## 2016-06-15 DIAGNOSIS — R1013 Epigastric pain: Secondary | ICD-10-CM

## 2016-06-15 DIAGNOSIS — E890 Postprocedural hypothyroidism: Secondary | ICD-10-CM

## 2016-06-15 DIAGNOSIS — R634 Abnormal weight loss: Secondary | ICD-10-CM | POA: Diagnosis not present

## 2016-06-15 DIAGNOSIS — S22000D Wedge compression fracture of unspecified thoracic vertebra, subsequent encounter for fracture with routine healing: Secondary | ICD-10-CM

## 2016-06-15 DIAGNOSIS — R140 Abdominal distension (gaseous): Secondary | ICD-10-CM

## 2016-06-15 NOTE — Assessment & Plan Note (Signed)
Suspect constipated -esp in light of high tsh  abd xr today Plan for 1 serving of miralax daily with fluids High fiber diet   Pending xr result F/u 1 mo

## 2016-06-15 NOTE — Progress Notes (Signed)
Subjective:    Patient ID: [name] [name], female    DOB: 10/09/1945, 70 y.o.   MRN: [id number]  HPI Here for f/u of acute on chronic medical problems   Ears are feeling full for about a week/occ sinus congestion  Sounds like she is in a barrell  L ear hurts a bit   Wt Readings from Last 3 Encounters:  06/15/16 110 lb (49.9 kg)  06/04/16 108 lb (49 kg)  04/26/16 110 lb 8 oz (50.1 kg)   Wt is up 2 lb (had prev been loosing)  Symptoms of dyspepsia at last visit  Given zantac 150 mg to take bid  Also enc to hold nsaid unless abs necessary  Overall improving -not as sore and growling less -not back to normal but getting better  She is able to eat a bit more also  Has cut back on meloxicam   Hypothyroidism Lab Results  Component Value Date   TSH 31.56 (H) 06/04/2016    This was still high  Dose was inc to 100 mcg -no side effect  No missed doses   cxr was done for wt loss Dg Chest 2 View  Result Date: 06/04/2016 CLINICAL DATA:  Unexplained weight loss. EXAM: CHEST  2 VIEW COMPARISON:  Radiographs of December 17, 2013. FINDINGS: Stable cardiomediastinal silhouette. Atherosclerosis of thoracic aorta is noted. No pneumothorax or pleural effusion is noted. Interval development of severe compression deformity of mid thoracic vertebral body consistent with fracture of indeterminate age. Right midlung subsegmental atelectasis or scarring is noted. IMPRESSION: Aortic atherosclerosis. Mild right midlung scarring or subsegmental atelectasis. Interval development of severe compression deformity of mid thoracic vertebral body consistent with fracture of indeterminate age; if patient is symptomatic in this area, MRI would be recommended for further evaluation. Electronically Signed   By: Marijo Conception, M.D.   On: 06/04/2016 16:19    She does have chronic back pain -nothing new  Still uses miacalcin ns  Due for a bone density test  Is also on evista    Patient Active Problem List   Diagnosis Date Noted  . Abdominal bloating 06/15/2016  . Cerumen impaction 06/15/2016  . Dyspepsia 06/04/2016  . Loss of weight 06/04/2016  . Cough 04/26/2016  . Compression fracture of thoracic vertebra ([place]) 01/19/2016  . Grief reaction 08/08/2015  . Dysphagia 06/28/2014  . Left foot pain 07/04/2013  . Sebaceous cyst 01/08/2013  . Chronic foot pain 10/27/2011  . Preoperative examination 04/14/2011  . Low back pain 04/14/2011  . FEVER BLISTER 11/17/2010  . Essential hypertension 08/18/2009  . BUNION 03/04/2009  . Allergic rhinitis 01/10/2008  . TOBACCO USE, QUIT 02/10/2007  . GOITER, MULTINODULAR 02/09/2007  . Hypothyroidism 02/09/2007  . HYPERCHOLESTEROLEMIA 02/09/2007  . DEPRESSION 02/09/2007  . OSTEOARTHRITIS 02/09/2007  . Osteoporosis 02/09/2007  . Disturbance in sleep behavior 02/09/2007   Past Medical History:  Diagnosis Date  . Depression   . Hypothyroidism   . Osteoarthritis   . Osteoporosis   . Shingles    arm 1/12  . Sleep disorder   . Tobacco abuse    past; quit 09   Past Surgical History:  Procedure Laterality Date  . BIOPSY THYROID     pos neoplasm, surgery 09/2006  . bunions  06/1998  . CXR-copd, ts partial compression fracture  12/2006  . dexa  12/1996  . hashimoto  02/1997  . hyerectomy- cervical ca cells    . left wrist fracture     surgery  .  right thyroid lobectomy, goiter, thyroiditis  12/2006  . stress fractures foot    . THYROIDECTOMY  11/2009   Social History  Substance Use Topics  . Smoking status: Former Research scientist (life sciences)  . Smokeless tobacco: Never Used     Comment: Quit 10/09  . Alcohol use No   Family History  Problem Relation Age of Onset  . Diabetes Mother   . Coronary artery disease Mother   . Stroke Father   . Hypertension Father    Allergies  Allergen Reactions  . Fosamax [Alendronate Sodium]     Dysphagia and heartburn    Current Outpatient Prescriptions on File Prior to Visit  Medication Sig Dispense Refill  . albuterol  (PROVENTIL HFA;VENTOLIN HFA) 108 (90 BASE) MCG/ACT inhaler Inhale 2 puffs into the lungs every 4 (four) hours as needed for wheezing. 1 Inhaler 11  . atorvastatin (LIPITOR) 40 MG tablet TAKE 1 TABLET EVERY DAY 90 tablet 2  . calcitonin, salmon, (MIACALCIN) 200 UNIT/ACT nasal spray Place 1 spray into alternate nostrils daily. 3.7 mL 2  . Calcium Carbonate-Vit D-Min (CALCIUM 1200 PO) Take by mouth daily.      . fluticasone (VERAMYST) 27.5 MCG/SPRAY nasal spray Place 2 sprays into the nose daily. 30 g 11  . ibuprofen (ADVIL,MOTRIN) 800 MG tablet Take one by mouth three times a day for 5 days     . levothyroxine (SYNTHROID, LEVOTHROID) 100 MCG tablet Take 1 tablet (100 mcg total) by mouth daily. 30 tablet 3  . lisinopril (PRINIVIL,ZESTRIL) 10 MG tablet TAKE 1/2 TABLET EVERY DAY 45 tablet 3  . loratadine (CLARITIN) 10 MG tablet Take 1 tablet (10 mg total) by mouth daily.    . meloxicam (MOBIC) 15 MG tablet Take 1 tablet (15 mg total) by mouth daily. With food as needed for foot pain 30 tablet 5  . mupirocin ointment (BACTROBAN) 2 % Apply to affected area twice daily 15 g 1  . nortriptyline (PAMELOR) 25 MG capsule TAKE 1 CAPSULE AT BEDTIME WITH A 75MG  CAPSULE 90 capsule 2  . nortriptyline (PAMELOR) 75 MG capsule TAKE 1 CAPSULE AT BEDTIME 90 capsule 2  . raloxifene (EVISTA) 60 MG tablet Take 1 tablet (60 mg total) by mouth daily. 30 tablet 11  . temazepam (RESTORIL) 30 MG capsule Take 1 capsule (30 mg total) by mouth at bedtime. 90 capsule 1  . tiZANidine (ZANAFLEX) 2 MG tablet Take 1 tablet (2 mg total) by mouth every 8 (eight) hours as needed for muscle spasms. 30 tablet 0  . traMADol (ULTRAM) 50 MG tablet Take 1 tablet (50 mg total) by mouth every 8 (eight) hours as needed for moderate pain or severe pain (careful of sedation and take with food). 3060 tablet 1  . valACYclovir (VALTREX) 1000 MG tablet Take 2 pills by mouth twice daily for one day as needed for cold sore 4 tablet 5   No current  facility-administered medications on file prior to visit.     Review of Systems Review of Systems  Constitutional: Negative for fever, appetite change, fatigue and unexpected weight change.  Eyes: Negative for pain and visual disturbance.  Respiratory: Negative for cough and shortness of breath.   Cardiovascular: Negative for cp or palpitations    Gastrointestinal: Negative for nausea, diarrhea and pos for constipation and abd bloating  Genitourinary: Negative for urgency and frequency. neg for dysuria   Skin: Negative for pallor or rash   MSK pos for chronic mid and low back pain and kyphosis  Neurological: Negative  for weakness, light-headedness, numbness and headaches.  Hematological: Negative for adenopathy. Does not bruise/bleed easily.  Psychiatric/Behavioral: Negative for dysphoric mood. The patient is not nervous/anxious.         Objective:   Physical Exam  Constitutional: She appears well-developed and well-nourished. No distress.  Slim and frail appearing   HENT:  Head: Normocephalic and atraumatic.  Mouth/Throat: Oropharynx is clear and moist.  Eyes: Conjunctivae and EOM are normal. Pupils are equal, round, and reactive to light. Right eye exhibits no discharge. Left eye exhibits no discharge. No scleral icterus.  Neck: Normal range of motion. Neck supple. No JVD present. Carotid bruit is not present. No thyromegaly present.  Thyroid scar noted   Cardiovascular: Normal rate, regular rhythm, normal heart sounds and intact distal pulses.  Exam reveals no gallop.   Pulmonary/Chest: Effort normal and breath sounds normal. No respiratory distress. She has no wheezes. She has no rales.  No crackles  bs are slt distant  Abdominal: Soft. Bowel sounds are normal. She exhibits no distension, no abdominal bruit, no pulsatile midline mass and no mass. There is tenderness in the right lower quadrant, epigastric area and left lower quadrant. There is no rigidity, no rebound, no  guarding, no CVA tenderness, no tenderness at McBurney's point and negative Murphy's sign.  Musculoskeletal: She exhibits no edema.  Kyphosis noted Mild lower TS tenderness  Lymphadenopathy:    She has no cervical adenopathy.  Neurological: She is alert. She has normal reflexes. No cranial nerve deficit. She exhibits normal muscle tone. Coordination normal.  Skin: Skin is warm and dry. No rash noted. No pallor.  Psychiatric: She has a normal mood and affect.          Assessment & Plan:   Problem List Items Addressed This Visit      Endocrine   Hypothyroidism - Primary    TSH still high Lab Results  Component Value Date   TSH 31.56 (H) 06/04/2016    Increased dose of levothyroxine to 100 mcg from 50  F/u 1 mo for re check  Not missing doses This may be causing constipation         Nervous and Auditory   Cerumen impaction    bilat-worse on L and symptomatic  Adv pt to get debrox otc or similar product and use twice weekly  Then f/u 1 mo and we will irrigate ears  Update sooner if symptoms worsen        Musculoskeletal and Integument   Compression fracture of thoracic vertebra ([facility])    Newer one on CXR-unsure now new  On evista and miacalcin No change in back pain or exam  Continue to follow Due for dexa if affordable -will plan at f/u       Osteoporosis    On evista and miacalcin New comp fx seen on CXr incidentally  Disc this  Would try prolia if pt were able to afford  Need to get another dexa -will order at f/u Fall precautions discussed/safety  Taking ca and D Former smoker        Other   Abdominal bloating    Suspect constipated -esp in light of high tsh  abd xr today Plan for 1 serving of miralax daily with fluids High fiber diet   Pending xr result F/u 1 mo       Relevant Orders   DG Abd 2 Views   Dyspepsia    Some improvement with ranitidine 150 bid  Continue this  Minimize  meloxicam (nsaid) Diet rev F/u 1 mo  abd xr for  bloating (? Constipation)      Loss of weight    Now that we are treating dyspepsia with zantac-appetite is better and wt is up 2 lb Will watch carefully  F/u in 1 mo  Disc meal plan /eating  Rev cxr       Other Visit Diagnoses   None.

## 2016-06-15 NOTE — Patient Instructions (Addendum)
Get debrox (ear drop) over the counter (or anything similar for ear wax)- and use it twice weekly as directed to soften up ear wax Then follow up with me in 1 month for a visit to check thyroid and also flush out ears  In meantime if ear pain worsens-let me know  I wonder if constipation may worsen your GI symptoms  We will do an xray of your abdomen today to see if this is the case  Get miralax (store brand is fine) and take a cap full with water every day to start cleaning out your bowels - and see if this helps  Continue your ranitidine (zantac) for stomach Try to eat regularly-your weight is up 2 lb   We will see you in a month

## 2016-06-15 NOTE — Assessment & Plan Note (Signed)
Newer one on CXR-unsure now new  On evista and miacalcin No change in back pain or exam  Continue to follow Due for dexa if affordable -will plan at f/u

## 2016-06-15 NOTE — Assessment & Plan Note (Signed)
bilat-worse on L and symptomatic  Adv pt to get debrox otc or similar product and use twice weekly  Then f/u 1 mo and we will irrigate ears  Update sooner if symptoms worsen

## 2016-06-15 NOTE — Assessment & Plan Note (Signed)
Now that we are treating dyspepsia with zantac-appetite is better and wt is up 2 lb Will watch carefully  F/u in 1 mo  Disc meal plan /eating  Rev cxr

## 2016-06-15 NOTE — Assessment & Plan Note (Signed)
Some improvement with ranitidine 150 bid  Continue this  Minimize meloxicam (nsaid) Diet rev F/u 1 mo  abd xr for bloating (? Constipation)

## 2016-06-15 NOTE — Assessment & Plan Note (Signed)
TSH still high Lab Results  Component Value Date   TSH 31.56 (H) 06/04/2016    Increased dose of levothyroxine to 100 mcg from 50  F/u 1 mo for re check  Not missing doses This may be causing constipation

## 2016-06-15 NOTE — Assessment & Plan Note (Addendum)
On evista and miacalcin New comp fx seen on CXr incidentally  Disc this  Would try prolia if pt were able to afford  Need to get another dexa -will order at f/u Fall precautions discussed/safety  Taking ca and D Former smoker

## 2016-06-15 NOTE — Progress Notes (Signed)
Pre visit review using our clinic review tool, if applicable. No additional management support is needed unless otherwise documented below in the visit note. 

## 2016-06-24 DIAGNOSIS — H524 Presbyopia: Secondary | ICD-10-CM | POA: Diagnosis not present

## 2016-06-24 DIAGNOSIS — Z0100 Encounter for examination of eyes and vision without abnormal findings: Secondary | ICD-10-CM | POA: Diagnosis not present

## 2016-06-27 ENCOUNTER — Telehealth: Payer: Self-pay | Admitting: Family Medicine

## 2016-06-27 DIAGNOSIS — S22000D Wedge compression fracture of unspecified thoracic vertebra, subsequent encounter for fracture with routine healing: Secondary | ICD-10-CM

## 2016-06-27 DIAGNOSIS — Z0000 Encounter for general adult medical examination without abnormal findings: Secondary | ICD-10-CM | POA: Insufficient documentation

## 2016-06-27 NOTE — Telephone Encounter (Signed)
-----   Message from Ellamae Sia sent at 06/23/2016  5:54 PM EDT ----- Regarding: Lab orders for Friday, 10.27.17 Patient is scheduled for CPX labs, please order future labs, Thanks , Karna Christmas

## 2016-07-01 ENCOUNTER — Telehealth: Payer: Self-pay

## 2016-07-01 DIAGNOSIS — Z1159 Encounter for screening for other viral diseases: Secondary | ICD-10-CM

## 2016-07-01 NOTE — Telephone Encounter (Signed)
Adding Hep C to labs 

## 2016-07-02 ENCOUNTER — Other Ambulatory Visit (INDEPENDENT_AMBULATORY_CARE_PROVIDER_SITE_OTHER): Payer: Commercial Managed Care - HMO

## 2016-07-02 ENCOUNTER — Ambulatory Visit (INDEPENDENT_AMBULATORY_CARE_PROVIDER_SITE_OTHER): Payer: Commercial Managed Care - HMO

## 2016-07-02 VITALS — BP 110/80 | HR 74 | Temp 97.9°F | Ht 63.5 in | Wt 110.5 lb

## 2016-07-02 DIAGNOSIS — Z0000 Encounter for general adult medical examination without abnormal findings: Secondary | ICD-10-CM | POA: Diagnosis not present

## 2016-07-02 DIAGNOSIS — E2839 Other primary ovarian failure: Secondary | ICD-10-CM

## 2016-07-02 DIAGNOSIS — E890 Postprocedural hypothyroidism: Secondary | ICD-10-CM

## 2016-07-02 DIAGNOSIS — M546 Pain in thoracic spine: Secondary | ICD-10-CM | POA: Diagnosis not present

## 2016-07-02 DIAGNOSIS — S22000D Wedge compression fracture of unspecified thoracic vertebra, subsequent encounter for fracture with routine healing: Secondary | ICD-10-CM | POA: Diagnosis not present

## 2016-07-02 DIAGNOSIS — Z1159 Encounter for screening for other viral diseases: Secondary | ICD-10-CM | POA: Diagnosis not present

## 2016-07-02 LAB — CBC WITH DIFFERENTIAL/PLATELET
Basophils Absolute: 0 10*3/uL (ref 0.0–0.1)
Basophils Relative: 0.6 % (ref 0.0–3.0)
Eosinophils Absolute: 0.1 10*3/uL (ref 0.0–0.7)
Eosinophils Relative: 2 % (ref 0.0–5.0)
HCT: 40.6 % (ref 36.0–46.0)
Hemoglobin: 13.5 g/dL (ref 12.0–15.0)
Lymphocytes Relative: 37.3 % (ref 12.0–46.0)
Lymphs Abs: 1.9 10*3/uL (ref 0.7–4.0)
MCHC: 33.3 g/dL (ref 30.0–36.0)
MCV: 93.4 fl (ref 78.0–100.0)
Monocytes Absolute: 0.4 10*3/uL (ref 0.1–1.0)
Monocytes Relative: 8.3 % (ref 3.0–12.0)
Neutro Abs: 2.6 10*3/uL (ref 1.4–7.7)
Neutrophils Relative %: 51.8 % (ref 43.0–77.0)
Platelets: 232 10*3/uL (ref 150.0–400.0)
RBC: 4.34 Mil/uL (ref 3.87–5.11)
RDW: 15.3 % (ref 11.5–15.5)
WBC: 5 10*3/uL (ref 4.0–10.5)

## 2016-07-02 LAB — LIPID PANEL
Cholesterol: 245 mg/dL — ABNORMAL HIGH (ref 0–200)
HDL: 81.2 mg/dL (ref >39.00)
LDL Cholesterol: 142 mg/dL — ABNORMAL HIGH (ref 0–99)
NonHDL: 163.46
Total CHOL/HDL Ratio: 3
Triglycerides: 108 mg/dL (ref 0.0–149.0)
VLDL: 21.6 mg/dL (ref 0.0–40.0)

## 2016-07-02 LAB — COMPREHENSIVE METABOLIC PANEL
ALT: 13 U/L (ref 0–35)
AST: 19 U/L (ref 0–37)
Albumin: 4 g/dL (ref 3.5–5.2)
Alkaline Phosphatase: 60 U/L (ref 39–117)
BUN: 12 mg/dL (ref 6–23)
CO2: 30 mEq/L (ref 19–32)
Calcium: 9.2 mg/dL (ref 8.4–10.5)
Chloride: 105 mEq/L (ref 96–112)
Creatinine, Ser: 0.81 mg/dL (ref 0.40–1.20)
GFR: 74.12 mL/min (ref >60.00)
Glucose, Bld: 87 mg/dL (ref 70–99)
Potassium: 4.3 mEq/L (ref 3.5–5.1)
Sodium: 140 mEq/L (ref 135–145)
Total Bilirubin: 0.3 mg/dL (ref 0.2–1.2)
Total Protein: 6.9 g/dL (ref 6.0–8.3)

## 2016-07-02 NOTE — Progress Notes (Signed)
PCP notes:   Health maintenance:  Shingles - pt declined Hep C screening - completed Bone density - ordered  Abnormal screenings:   Hearing - failed Mini-Cog score: 17/20  Patient concerns:   Per pt pt, she has recent onset of neck pain that started approx. 1 month ago. Pain is constant. Pain level is 8/10. Pain is worse in morning upon waking and decreases as day progresses.   Pt also reports feeling fluid behind both ears which has impacted hearing. Onset approximately one month ago.   Nurse concerns:  None  Next PCP appt:   07/09/16 @ 1030

## 2016-07-02 NOTE — Telephone Encounter (Signed)
That is fine, please add hep C

## 2016-07-02 NOTE — Addendum Note (Signed)
Addended by: Ellamae Sia on: 07/02/2016 10:47 AM   Modules accepted: Orders

## 2016-07-02 NOTE — Patient Instructions (Signed)
[name] [name] , Thank you for taking time to come for your Medicare Wellness Visit. I appreciate your ongoing commitment to your health goals. Please review the following plan we discussed and let me know if I can assist you in the future.   These are the goals we discussed: Goals    . Increase physical activity          Starting 07/02/2016, I will continue to walk at least 30 min daily.        This is a list of the screening recommended for you and due dates:  Health Maintenance  Topic Date Due  . DEXA scan (bone density measurement)  09/05/2016*  . Mammogram  05/24/2021*  . Colon Cancer Screening  05/24/2021*  . Shingles Vaccine  07/02/2026*  . Tetanus Vaccine  11/09/2017  . Flu Shot  Completed  .  Hepatitis C: One time screening is recommended by Center for Disease Control  (CDC) for  adults born from [birth year] through 1965.   Completed  . Pneumonia vaccines  Completed  *Topic was postponed. The date shown is not the original due date.   Preventive Care for Adults  A healthy lifestyle and preventive care can promote health and wellness. Preventive health guidelines for adults include the following key practices.  . A routine yearly physical is a good way to check with your health care provider about your health and preventive screening. It is a chance to share any concerns and updates on your health and to receive a thorough exam.  . Visit your dentist for a routine exam and preventive care every 6 months. Brush your teeth twice a day and floss once a day. Good oral hygiene prevents tooth decay and gum disease.  . The frequency of eye exams is based on your age, health, family medical history, use  of contact lenses, and other factors. Follow your health care provider's ecommendations for frequency of eye exams.  . Eat a healthy diet. Foods like vegetables, fruits, whole grains, low-fat dairy products, and lean protein foods contain the nutrients you need without too many calories.  Decrease your intake of foods high in solid fats, added sugars, and salt. Eat the right amount of calories for you. Get information about a proper diet from your health care provider, if necessary.  . Regular physical exercise is one of the most important things you can do for your health. Most adults should get at least 150 minutes of moderate-intensity exercise (any activity that increases your heart rate and causes you to sweat) each week. In addition, most adults need muscle-strengthening exercises on 2 or more days a week.  Silver Sneakers may be a benefit available to you. To determine eligibility, you may visit the website: www.silversneakers.com or contact program at 3212611622 Mon-Fri between 8AM-8PM.   . Maintain a healthy weight. The body mass index (BMI) is a screening tool to identify possible weight problems. It provides an estimate of body fat based on height and weight. Your health care provider can find your BMI and can help you achieve or maintain a healthy weight.   For adults 20 years and older: ? A BMI below 18.5 is considered underweight. ? A BMI of 18.5 to 24.9 is normal. ? A BMI of 25 to 29.9 is considered overweight. ? A BMI of 30 and above is considered obese.   . Maintain normal blood lipids and cholesterol levels by exercising and minimizing your intake of saturated fat. Eat a balanced diet with  plenty of fruit and vegetables. Blood tests for lipids and cholesterol should begin at age 31 and be repeated every 5 years. If your lipid or cholesterol levels are high, you are over 50, or you are at high risk for heart disease, you may need your cholesterol levels checked more frequently. Ongoing high lipid and cholesterol levels should be treated with medicines if diet and exercise are not working.  . If you smoke, find out from your health care provider how to quit. If you do not use tobacco, please do not start.  . If you choose to drink alcohol, please do not consume  more than 2 drinks per day. One drink is considered to be 12 ounces (355 mL) of beer, 5 ounces (148 mL) of wine, or 1.5 ounces (44 mL) of liquor.  . If you are 77-32 years old, ask your health care provider if you should take aspirin to prevent strokes.  . Use sunscreen. Apply sunscreen liberally and repeatedly throughout the day. You should seek shade when your shadow is shorter than you. Protect yourself by wearing long sleeves, pants, a wide-brimmed hat, and sunglasses year round, whenever you are outdoors.  . Once a month, do a whole body skin exam, using a mirror to look at the skin on your back. Tell your health care provider of new moles, moles that have irregular borders, moles that are larger than a pencil eraser, or moles that have changed in shape or color.

## 2016-07-02 NOTE — Progress Notes (Signed)
Pre visit review using our clinic review tool, if applicable. No additional management support is needed unless otherwise documented below in the visit note. 

## 2016-07-02 NOTE — Progress Notes (Signed)
Subjective:   [name] [name] is a 69 y.o. female who presents for an Initial Medicare Annual Wellness Visit.  Review of Systems    N/A  Cardiac Risk Factors include: advanced age (>5men, >57 women);dyslipidemia;hypertension     Objective:    Today's Vitals   07/02/16 0943 07/02/16 0945  BP: 110/80   Pulse: 74   Temp: 97.9 F (36.6 C)   TempSrc: Oral   SpO2: 98%   Weight: 110 lb 8 oz (50.1 kg)   Height: 5' 3.5" (1.613 m)   PainSc: 8  8   PainLoc: Neck    Body mass index is 19.27 kg/m.   Current Medications (verified) Outpatient Encounter Prescriptions as of 07/02/2016  Medication Sig  . albuterol (PROVENTIL HFA;VENTOLIN HFA) 108 (90 BASE) MCG/ACT inhaler Inhale 2 puffs into the lungs every 4 (four) hours as needed for wheezing.  Marland Kitchen atorvastatin (LIPITOR) 40 MG tablet TAKE 1 TABLET EVERY DAY  . calcitonin, salmon, (MIACALCIN) 200 UNIT/ACT nasal spray Place 1 spray into alternate nostrils daily.  . Calcium Carbonate-Vit D-Min (CALCIUM 1200 PO) Take by mouth daily.    . fluticasone (VERAMYST) 27.5 MCG/SPRAY nasal spray Place 2 sprays into the nose daily.  Marland Kitchen ibuprofen (ADVIL,MOTRIN) 800 MG tablet Take one by mouth three times a day for 5 days   . levothyroxine (SYNTHROID, LEVOTHROID) 100 MCG tablet Take 1 tablet (100 mcg total) by mouth daily.  Marland Kitchen lisinopril (PRINIVIL,ZESTRIL) 10 MG tablet TAKE 1/2 TABLET EVERY DAY  . loratadine (CLARITIN) 10 MG tablet Take 1 tablet (10 mg total) by mouth daily.  . meloxicam (MOBIC) 15 MG tablet Take 1 tablet (15 mg total) by mouth daily. With food as needed for foot pain  . mupirocin ointment (BACTROBAN) 2 % Apply to affected area twice daily  . nortriptyline (PAMELOR) 25 MG capsule TAKE 1 CAPSULE AT BEDTIME WITH A 75MG  CAPSULE  . nortriptyline (PAMELOR) 75 MG capsule TAKE 1 CAPSULE AT BEDTIME  . raloxifene (EVISTA) 60 MG tablet Take 1 tablet (60 mg total) by mouth daily.  . temazepam (RESTORIL) 30 MG capsule Take 1 capsule (30 mg total)  by mouth at bedtime.  Marland Kitchen tiZANidine (ZANAFLEX) 2 MG tablet Take 1 tablet (2 mg total) by mouth every 8 (eight) hours as needed for muscle spasms.  . traMADol (ULTRAM) 50 MG tablet Take 1 tablet (50 mg total) by mouth every 8 (eight) hours as needed for moderate pain or severe pain (careful of sedation and take with food).  . valACYclovir (VALTREX) 1000 MG tablet Take 2 pills by mouth twice daily for one day as needed for cold sore   No facility-administered encounter medications on file as of 07/02/2016.     Allergies (verified) Fosamax [alendronate sodium]   History: Past Medical History:  Diagnosis Date  . Depression   . Hypothyroidism   . Osteoarthritis   . Osteoporosis   . Shingles    arm 1/12  . Sleep disorder   . Tobacco abuse    past; quit 09   Past Surgical History:  Procedure Laterality Date  . BIOPSY THYROID     pos neoplasm, surgery 09/2006  . bunions  06/1998  . CXR-copd, ts partial compression fracture  12/2006  . dexa  12/1996  . hashimoto  02/1997  . hyerectomy- cervical ca cells    . left wrist fracture     surgery  . right thyroid lobectomy, goiter, thyroiditis  12/2006  . stress fractures foot    . THYROIDECTOMY  11/2009   Family History  Problem Relation Age of Onset  . Stroke Father   . Hypertension Father   . Diabetes Mother   . Coronary artery disease Mother    Social History   Occupational History  . Not on file.   Social History Main Topics  . Smoking status: Former Research scientist (life sciences)  . Smokeless tobacco: Never Used     Comment: Quit 10/09  . Alcohol use No  . Drug use: No  . Sexual activity: Not on file    Tobacco Counseling Counseling given: No   Activities of Daily Living In your present state of health, do you have any difficulty performing the following activities: 07/02/2016  Hearing? Y  Vision? N  Difficulty concentrating or making decisions? N  Walking or climbing stairs? N  Dressing or bathing? N  Doing errands, shopping? N    Preparing Food and eating ? N  Using the Toilet? N  In the past six months, have you accidently leaked urine? N  Do you have problems with loss of bowel control? N  Managing your Medications? N  Managing your Finances? N  Housekeeping or managing your Housekeeping? N  Some recent data might be hidden    Immunizations and Health Maintenance Immunization History  Administered Date(s) Administered  . Influenza Split 05/24/2012  . Influenza Whole 06/16/2007  . Influenza,inj,Quad PF,36+ Mos 07/04/2013, 06/28/2014, 07/25/2015, 06/04/2016  . Pneumococcal Conjugate-13 08/08/2015  . Pneumococcal Polysaccharide-23 11/10/2007, 06/28/2014  . Td 11/10/2007   There are no preventive care reminders to display for this patient.  Patient Care Team: Abner Greenspan, MD as PCP - [place], OD as Consulting Physician (Optometry)       Assessment:   This is a routine wellness examination for [name] [name].   Hearing/Vision screen  Hearing Screening   125Hz  250Hz  500Hz  1000Hz  2000Hz  3000Hz  4000Hz  6000Hz  8000Hz   Right ear:   40 0 40  0    Left ear:   0 40 40  0    Vision Screening Comments: Last vision exam on 06/24/2016 with Dr. Kerin [name]  Dietary issues and exercise activities discussed: Current Exercise Habits: Home exercise routine, Type of exercise: walking, Time (Minutes): 30, Frequency (Times/Week): 7, Weekly Exercise (Minutes/Week): 210, Intensity: Mild, Exercise limited by: None identified  Goals    . Increase physical activity          Starting 07/02/2016, I will continue to walk at least 30 min daily.       Depression Screen PHQ 2/9 Scores 07/02/2016 01/02/2014  PHQ - 2 Score 0 0    Fall Risk Fall Risk  07/02/2016 01/02/2014  Falls in the past year? No No    Cognitive Function: MMSE - Mini Mental State Exam 07/02/2016  Orientation to time 5  Orientation to Place 5  Registration 3  Attention/ Calculation 0  Recall 3  Language- name 2 objects 0  Language- repeat 1   Language- follow 3 step command 0  Language- follow 3 step command-comments pt was unable to follow 3 steps of 3 step command  Language- read & follow direction 0  Write a sentence 0  Copy design 0  Total score 17     PLEASE NOTE: A Mini-Cog screen was completed. Maximum score is 20. A value of 0 denotes this part of Folstein MMSE was not completed or the patient failed this part of the Mini-Cog screening.   Mini-Cog Screening Orientation to Time - Max 5 pts Orientation to Place -  Max 5 pts Registration - Max 3 pts Recall - Max 3 pts Language Repeat - Max 1 pts Language Follow 3 Step Command - Max 3 pts     Screening Tests Health Maintenance  Topic Date Due  . DEXA SCAN  09/05/2016 (Originally 09/11/2010)  . MAMMOGRAM  05/24/2021 (Originally 09/12/1995)  . COLONOSCOPY  05/24/2021 (Originally 09/12/1995)  . ZOSTAVAX  07/02/2026 (Originally 09/11/2005)  . TETANUS/TDAP  11/09/2017  . INFLUENZA VACCINE  Completed  . Hepatitis C Screening  Completed  . PNA vac Low Risk Adult  Completed      Plan:     I have personally reviewed and addressed the Medicare Annual Wellness questionnaire and have noted the following in the patient's chart:  A. Medical and social history B. Use of alcohol, tobacco or illicit drugs  C. Current medications and supplements D. Functional ability and status E.  Nutritional status F.  Physical activity G. Advance directives H. List of other physicians I.  Hospitalizations, surgeries, and ER visits in previous 12 months J.  [place] to include hearing, vision, cognitive, depression L. Referrals and appointments - none  In addition, I have reviewed and discussed with patient certain preventive protocols, quality metrics, and best practice recommendations. A written personalized care plan for preventive services as well as general preventive health recommendations were provided to patient.  See attached scanned questionnaire for additional  information.   Signed,   Lindell Noe, MHA, BS, LPN Health Coach

## 2016-07-03 LAB — HEPATITIS C ANTIBODY: HCV Ab: NEGATIVE

## 2016-07-05 LAB — TSH: TSH: 1.49 u[IU]/mL (ref 0.35–4.50)

## 2016-07-05 LAB — VITAMIN D 25 HYDROXY (VIT D DEFICIENCY, FRACTURES): VITD: 33.02 ng/mL (ref 30.00–100.00)

## 2016-07-05 NOTE — Progress Notes (Signed)
I reviewed health advisor's note, was available for consultation, and agree with documentation and plan.  

## 2016-07-09 ENCOUNTER — Encounter: Payer: Self-pay | Admitting: Family Medicine

## 2016-07-09 ENCOUNTER — Encounter: Payer: Self-pay | Admitting: *Deleted

## 2016-07-09 ENCOUNTER — Ambulatory Visit (INDEPENDENT_AMBULATORY_CARE_PROVIDER_SITE_OTHER): Payer: Commercial Managed Care - HMO | Admitting: Family Medicine

## 2016-07-09 VITALS — BP 102/70 | HR 85 | Temp 98.5°F | Ht 63.5 in | Wt 110.2 lb

## 2016-07-09 DIAGNOSIS — I10 Essential (primary) hypertension: Secondary | ICD-10-CM

## 2016-07-09 DIAGNOSIS — E7800 Pure hypercholesterolemia, unspecified: Secondary | ICD-10-CM

## 2016-07-09 DIAGNOSIS — H6123 Impacted cerumen, bilateral: Secondary | ICD-10-CM

## 2016-07-09 DIAGNOSIS — Z79899 Other long term (current) drug therapy: Secondary | ICD-10-CM | POA: Diagnosis not present

## 2016-07-09 DIAGNOSIS — M8000XS Age-related osteoporosis with current pathological fracture, unspecified site, sequela: Secondary | ICD-10-CM

## 2016-07-09 DIAGNOSIS — Z0000 Encounter for general adult medical examination without abnormal findings: Secondary | ICD-10-CM

## 2016-07-09 DIAGNOSIS — E890 Postprocedural hypothyroidism: Secondary | ICD-10-CM | POA: Diagnosis not present

## 2016-07-09 DIAGNOSIS — J301 Allergic rhinitis due to pollen: Secondary | ICD-10-CM

## 2016-07-09 DIAGNOSIS — M810 Age-related osteoporosis without current pathological fracture: Secondary | ICD-10-CM

## 2016-07-09 MED ORDER — RALOXIFENE HCL 60 MG PO TABS: 60.0000 mg | ORAL_TABLET | Freq: Every day | ORAL | 3 refills | Status: DC

## 2016-07-09 MED ORDER — LISINOPRIL 10 MG PO TABS: 5.0000 mg | ORAL_TABLET | Freq: Every day | ORAL | 3 refills | Status: DC

## 2016-07-09 MED ORDER — NORTRIPTYLINE HCL 25 MG PO CAPS: ORAL_CAPSULE | ORAL | 3 refills | Status: DC

## 2016-07-09 MED ORDER — CALCITONIN (SALMON) 200 UNIT/ACT NA SOLN: 1.0000 | Freq: Every day | NASAL | 3 refills | Status: DC

## 2016-07-09 MED ORDER — LEVOTHYROXINE SODIUM 100 MCG PO TABS: 100.0000 ug | ORAL_TABLET | Freq: Every day | ORAL | 3 refills | Status: DC

## 2016-07-09 MED ORDER — ATORVASTATIN CALCIUM 40 MG PO TABS: 40.0000 mg | ORAL_TABLET | Freq: Every day | ORAL | 3 refills | Status: DC

## 2016-07-09 MED ORDER — NORTRIPTYLINE HCL 75 MG PO CAPS: 75.0000 mg | ORAL_CAPSULE | Freq: Every day | ORAL | 3 refills | Status: DC

## 2016-07-09 NOTE — Assessment & Plan Note (Addendum)
dexa was ordered at [facility] and will be upcoming  Has had fx incl spinal in the past Prev smoker Intol of bisphosphenates  On evista and miacaclin currently along with ca and D inst to double check D dose No falls or fx lately  Disc need for calcium/ vitamin D/ wt bearing exercise and bone density test every 2 y to monitor Disc safety/ fracture risk in detail

## 2016-07-09 NOTE — Progress Notes (Signed)
Pre visit review using our clinic review tool, if applicable. No additional management support is needed unless otherwise documented below in the visit note. 

## 2016-07-09 NOTE — Assessment & Plan Note (Signed)
Worse sinus and ear pressure lately (ETD) Recommend flonase ns otc 2 sprays each nostril once daily  Update if no imp or if worse vertigo symptoms

## 2016-07-09 NOTE — Assessment & Plan Note (Signed)
Post surgical Lab Results  Component Value Date   TSH 1.49 07/02/2016    Better after resuming current dose Disc app way to take it (am -before bkfast or other meds)

## 2016-07-09 NOTE — Assessment & Plan Note (Signed)
bilat partial-likely interfering with hearing  Using debrox for a week  Ears feel full  Offered irrigation today-pt declined -she will return in 1 mo when debrox has more time to work  Will watch for ear pain or fever

## 2016-07-09 NOTE — Patient Instructions (Addendum)
Make sure that in addition to your calcium/ D pill you also take at least 2000 iu of vitamin D by itself  We may eventually have to raise your cholesterol medicine Avoid red meat/ fried foods/ egg yolks/ fatty breakfast meats/ butter, cheese and high fat dairy/ and shellfish    I wonder if you have inner ear problems (or if symptoms are from ear wax) Keep using the debrox  Follow up in about a month to re check ears and flush them if we need to  Also - get flonase nasal spray over the counter and use daily for sinus and inner ear problems  Continue your other medicine  Urine drug screen today

## 2016-07-09 NOTE — Assessment & Plan Note (Signed)
Disc goals for lipids and reasons to control them Rev labs with pt Rev low sat fat diet in detail  She takes atorvastatin 40 compliantly LDL is creeping up however  Ref low sat/trans fat diet  Will inc dose if needed

## 2016-07-09 NOTE — Assessment & Plan Note (Signed)
Reviewed health habits including diet and exercise and skin cancer prevention Reviewed appropriate screening tests for age  Also reviewed health mt list, fam hx and immunization status , as well as social and family history   See HPI AMW reviewed Labs reviewed  Declines any breast or colon cancer screening  Declines zoster vaccine  Make sure that in addition to your calcium/ D pill you also take at least 2000 iu of vitamin D by itself  We may eventually have to raise your cholesterol medicine Avoid red meat/ fried foods/ egg yolks/ fatty breakfast meats/ butter, cheese and high fat dairy/ and shellfish

## 2016-07-09 NOTE — Progress Notes (Signed)
Subjective:    Patient ID: [name] [name], female    DOB: 07/03/1946, 70 y.o.   MRN: [id number]  HPI Here for health maintenance exam and to review chronic medical problems    Had AMW last month  Declined shingles vaccine  Hep C screen-neg Ordered dexa   Failed hearing exam-she felt pressure (fluid) in ears and cerumen impaction  Mini cog score 17/20- missed the 3 step command (poss due to not hearing the instructions however)  She has been using debrox in her ears - no wax has come out on its own  Occ dizzy also  ? Inner ear  Still uses veramyst nasal spray     Mammogram-not interested/declines  No lump on self exam   Colon cancer screening -declines any type of screening at all   Wt Readings from Last 3 Encounters:  07/09/16 110 lb 4 oz (50 kg)  07/02/16 110 lb 8 oz (50.1 kg)  06/15/16 110 lb (49.9 kg)  eating well and eating enough  Has always been slim    Hx of OP  Has been intol of fosamax On evista and miacalcin  Vit D level is 33.0 Multiple fx in the past      Hypothyroidism  Pt has no clinical changes No change in energy level/ hair or skin/ edema and no tremor Lab Results  Component Value Date   TSH 1.49 07/02/2016    Hx of thyroidectomy in the past   bp is stable today  No cp or palpitations or headaches or edema  No side effects to medicines  BP Readings from Last 3 Encounters:  07/09/16 102/70  07/02/16 110/80  06/15/16 98/70      Hx of hyperlipidemia Lab Results  Component Value Date   CHOL 245 (H) 07/02/2016   CHOL 227 (H) 01/02/2016   CHOL 199 06/28/2014   Lab Results  Component Value Date   HDL 81.20 07/02/2016   HDL 75.60 01/02/2016   HDL 77 06/28/2014   Lab Results  Component Value Date   LDLCALC 142 (H) 07/02/2016   LDLCALC 138 (H) 01/02/2016   LDLCALC 111 (H) 06/28/2014   Lab Results  Component Value Date   TRIG 108.0 07/02/2016   TRIG 66.0 01/02/2016   TRIG 55 06/28/2014   Lab Results  Component Value Date   CHOLHDL 3 07/02/2016   CHOLHDL 3 01/02/2016   CHOLHDL 2.6 06/28/2014   Lab Results  Component Value Date   LDLDIRECT 140.9 07/04/2013   LDLDIRECT 140.9 07/10/2012   LDLDIRECT 208.7 05/24/2012   on atorvastatin - she still takes 40 mg daily  Diet is good- stays away from fried foods Lots of green veg and cabbage    Long hx of sleep disorder  On temazepem -does not help very much  Last dose was last night     Review of Systems Review of Systems  Constitutional: Negative for fever, appetite change, fatigue and unexpected weight change.  Eyes: Negative for pain and visual disturbance.  ENT pos for ear fullness/dec hearing  Respiratory: Negative for cough and shortness of breath.   Cardiovascular: Negative for cp or palpitations    Gastrointestinal: Negative for nausea, diarrhea and constipation. stomach growls a lot  Genitourinary: Negative for urgency and frequency.  Skin: Negative for pallor or rash   Neurological: Negative for weakness, light-headedness, numbness and headaches. pos for occ positional dizziness MSK pos for neck muscle spasm Hematological: Negative for adenopathy. Does not bruise/bleed easily.  Psychiatric/Behavioral: Negative for  dysphoric mood. The patient is not nervous/anxious.         Objective:   Physical Exam  Constitutional: She appears well-developed and well-nourished. No distress.  Slim elderly female in no distress  HENT:  Head: Normocephalic and atraumatic.  Right Ear: External ear normal.  Left Ear: External ear normal.  Mouth/Throat: Oropharynx is clear and moist.  Eyes: Conjunctivae and EOM are normal. Pupils are equal, round, and reactive to light. No scleral icterus.  Neck: Normal range of motion. Neck supple. No JVD present. Carotid bruit is not present. No thyromegaly present.  Thyroid scar unchanged   Cardiovascular: Normal rate, regular rhythm, normal heart sounds and intact distal pulses.  Exam reveals no gallop.     Pulmonary/Chest: Effort normal and breath sounds normal. No respiratory distress. She has no wheezes. She exhibits no tenderness.  Abdominal: Soft. Bowel sounds are normal. She exhibits no distension, no abdominal bruit and no mass. There is no tenderness.  Genitourinary: No breast swelling, tenderness, discharge or bleeding.  Genitourinary Comments: Breast exam: No mass, nodules, thickening, tenderness, bulging, retraction, inflamation, nipple discharge or skin changes noted.  No axillary or clavicular LA.      Musculoskeletal: Normal range of motion. She exhibits no edema or tenderness.  Moderate kyphosis  Lymphadenopathy:    She has no cervical adenopathy.  Neurological: She is alert. She has normal reflexes. No cranial nerve deficit. She exhibits normal muscle tone. Coordination normal.  Skin: Skin is warm and dry. No rash noted. No erythema. No pallor.  Diffuse lentigines and also tanned  SKs on trunk   Psychiatric: She has a normal mood and affect.          Assessment & Plan:   Problem List Items Addressed This Visit      Cardiovascular and Mediastinum   Essential hypertension - Primary    bp in fair control at this time  BP Readings from Last 1 Encounters:  07/09/16 102/70   No changes needed Disc lifstyle change with low sodium diet and exercise  She will continue lisinipril      Relevant Medications   atorvastatin (LIPITOR) 40 MG tablet   lisinopril (PRINIVIL,ZESTRIL) 10 MG tablet     Respiratory   Allergic rhinitis    Worse sinus and ear pressure lately (ETD) Recommend flonase ns otc 2 sprays each nostril once daily  Update if no imp or if worse vertigo symptoms         Endocrine   Hypothyroidism    Post surgical Lab Results  Component Value Date   TSH 1.49 07/02/2016    Better after resuming current dose Disc app way to take it (am -before bkfast or other meds)      Relevant Medications   levothyroxine (SYNTHROID, LEVOTHROID) 100 MCG tablet      Nervous and Auditory   Cerumen impaction    bilat partial-likely interfering with hearing  Using debrox for a week  Ears feel full  Offered irrigation today-pt declined -she will return in 1 mo when debrox has more time to work  Will watch for ear pain or fever        Musculoskeletal and Integument   Osteoporosis    dexa was ordered at [facility] and will be upcoming  Has had fx incl spinal in the past Prev smoker Intol of bisphosphenates  On evista and miacaclin currently along with ca and D inst to double check D dose No falls or fx lately  Disc need for calcium/  vitamin D/ wt bearing exercise and bone density test every 2 y to monitor Disc safety/ fracture risk in detail        Relevant Medications   calcitonin, salmon, (MIACALCIN) 200 UNIT/ACT nasal spray   raloxifene (EVISTA) 60 MG tablet     Other   HYPERCHOLESTEROLEMIA    Disc goals for lipids and reasons to control them Rev labs with pt Rev low sat fat diet in detail  She takes atorvastatin 40 compliantly LDL is creeping up however  Ref low sat/trans fat diet  Will inc dose if needed       Relevant Medications   atorvastatin (LIPITOR) 40 MG tablet   lisinopril (PRINIVIL,ZESTRIL) 10 MG tablet   Routine general medical examination at a health care facility    Reviewed health habits including diet and exercise and skin cancer prevention Reviewed appropriate screening tests for age  Also reviewed health mt list, fam hx and immunization status , as well as social and family history   See HPI AMW reviewed Labs reviewed  Declines any breast or colon cancer screening  Declines zoster vaccine  Make sure that in addition to your calcium/ D pill you also take at least 2000 iu of vitamin D by itself  We may eventually have to raise your cholesterol medicine Avoid red meat/ fried foods/ egg yolks/ fatty breakfast meats/ butter, cheese and high fat dairy/ and shellfish

## 2016-07-09 NOTE — Assessment & Plan Note (Signed)
bp in fair control at this time  BP Readings from Last 1 Encounters:  07/09/16 102/70   No changes needed Disc lifstyle change with low sodium diet and exercise  She will continue lisinipril

## 2016-08-02 ENCOUNTER — Other Ambulatory Visit: Payer: Self-pay | Admitting: Family Medicine

## 2016-08-03 NOTE — Telephone Encounter (Signed)
Rxs sent to [facility]

## 2016-08-12 ENCOUNTER — Ambulatory Visit: Payer: Commercial Managed Care - HMO | Attending: Family Medicine

## 2016-08-13 ENCOUNTER — Encounter: Payer: Self-pay | Admitting: Family Medicine

## 2016-08-13 ENCOUNTER — Ambulatory Visit (INDEPENDENT_AMBULATORY_CARE_PROVIDER_SITE_OTHER): Payer: Commercial Managed Care - HMO | Admitting: Family Medicine

## 2016-08-13 VITALS — BP 108/82 | HR 77 | Temp 98.3°F | Ht 63.5 in | Wt 111.2 lb

## 2016-08-13 DIAGNOSIS — H6123 Impacted cerumen, bilateral: Secondary | ICD-10-CM | POA: Diagnosis not present

## 2016-08-13 NOTE — Progress Notes (Signed)
Pre visit review using our clinic review tool, if applicable. No additional management support is needed unless otherwise documented below in the visit note. 

## 2016-08-13 NOTE — Assessment & Plan Note (Signed)
Much improved after simple irrigation-also with improved hearing Pt tolerated it well   inst to keep using debrox twice monthly to control cerumen   F/u if needed

## 2016-08-13 NOTE — Patient Instructions (Signed)
Ears look clear  You should hear even better when the water dries up  Use wax dissolving drops twice month to help control wax

## 2016-08-13 NOTE — Progress Notes (Signed)
Subjective:    Patient ID: [name] [name], female    DOB: [birth year], 70 y.o.   MRN: [id number]  HPI Here for f/u of cerumen impaction  Was inst to use debrox since last visit   Feels better overall   Had a popping in her ears one day and she could hear better   Patient Active Problem List   Diagnosis Date Noted  . Routine general medical examination at a health care facility 06/27/2016  . Abdominal bloating 06/15/2016  . Cerumen impaction 06/15/2016  . Dyspepsia 06/04/2016  . Loss of weight 06/04/2016  . Cough 04/26/2016  . History of vertebral compression fracture 01/19/2016  . Grief reaction 08/08/2015  . Dysphagia 06/28/2014  . Sebaceous cyst 01/08/2013  . Chronic foot pain 10/27/2011  . Low back pain 04/14/2011  . FEVER BLISTER 11/17/2010  . Essential hypertension 08/18/2009  . BUNION 03/04/2009  . Allergic rhinitis 01/10/2008  . TOBACCO USE, QUIT 02/10/2007  . GOITER, MULTINODULAR 02/09/2007  . Hypothyroidism 02/09/2007  . HYPERCHOLESTEROLEMIA 02/09/2007  . DEPRESSION 02/09/2007  . OSTEOARTHRITIS 02/09/2007  . Osteoporosis 02/09/2007  . Disturbance in sleep behavior 02/09/2007   Past Medical History:  Diagnosis Date  . Depression   . Hypothyroidism   . Osteoarthritis   . Osteoporosis   . Shingles    arm 1/12  . Sleep disorder   . Tobacco abuse    past; quit 09   Past Surgical History:  Procedure Laterality Date  . BIOPSY THYROID     pos neoplasm, surgery 09/2006  . bunions  06/1998  . CXR-copd, ts partial compression fracture  12/2006  . dexa  12/1996  . hashimoto  02/1997  . hyerectomy- cervical ca cells    . left wrist fracture     surgery  . right thyroid lobectomy, goiter, thyroiditis  12/2006  . stress fractures foot    . THYROIDECTOMY  11/2009   Social History  Substance Use Topics  . Smoking status: Former Research scientist (life sciences)  . Smokeless tobacco: Never Used     Comment: Quit 10/09  . Alcohol use No   Family History  Problem Relation Age of Onset    . Stroke Father   . Hypertension Father   . Diabetes Mother   . Coronary artery disease Mother    Allergies  Allergen Reactions  . Fosamax [Alendronate Sodium]     Dysphagia and heartburn    Current Outpatient Prescriptions on File Prior to Visit  Medication Sig Dispense Refill  . albuterol (PROVENTIL HFA;VENTOLIN HFA) 108 (90 BASE) MCG/ACT inhaler Inhale 2 puffs into the lungs every 4 (four) hours as needed for wheezing. 1 Inhaler 11  . atorvastatin (LIPITOR) 40 MG tablet TAKE 1 TABLET EVERY DAY 90 tablet 3  . calcitonin, salmon, (MIACALCIN) 200 UNIT/ACT nasal spray Place 1 spray into alternate nostrils daily. 3.7 mL 3  . Calcium Carbonate-Vit D-Min (CALCIUM 1200 PO) Take by mouth daily.      Marland Kitchen levothyroxine (SYNTHROID, LEVOTHROID) 100 MCG tablet Take 1 tablet (100 mcg total) by mouth daily. 90 tablet 3  . lisinopril (PRINIVIL,ZESTRIL) 10 MG tablet Take 0.5 tablets (5 mg total) by mouth daily. 45 tablet 3  . loratadine (CLARITIN) 10 MG tablet Take 1 tablet (10 mg total) by mouth daily.    . mupirocin ointment (BACTROBAN) 2 % Apply to affected area twice daily 15 g 1  . nortriptyline (PAMELOR) 25 MG capsule TAKE 1 CAPSULE AT BEDTIME WITH A 75 MG CAPSULE 90 capsule 3  .  nortriptyline (PAMELOR) 75 MG capsule TAKE 1 CAPSULE AT BEDTIME 90 capsule 3  . raloxifene (EVISTA) 60 MG tablet Take 1 tablet (60 mg total) by mouth daily. 90 tablet 3  . temazepam (RESTORIL) 30 MG capsule Take 1 capsule (30 mg total) by mouth at bedtime. 90 capsule 1  . valACYclovir (VALTREX) 1000 MG tablet Take 2 pills by mouth twice daily for one day as needed for cold sore 4 tablet 5   No current facility-administered medications on file prior to visit.     Review of Systems Review of Systems  Constitutional: Negative for fever, appetite change, fatigue and unexpected weight change.  Eyes: Negative for pain and visual disturbance.  Respiratory: Negative for cough and shortness of breath.   Cardiovascular:  Negative for cp or palpitations    Gastrointestinal: Negative for nausea, diarrhea and constipation.  Genitourinary: Negative for urgency and frequency.  Skin: Negative for pallor or rash   Neurological: Negative for weakness, light-headedness, numbness and headaches.  Hematological: Negative for adenopathy. Does not bruise/bleed easily.  Psychiatric/Behavioral: Negative for dysphoric mood. The patient is not nervous/anxious.         Objective:   Physical Exam  Constitutional: She appears well-developed and well-nourished. No distress.  Slim and well appearing elderly female   HENT:  Head: Normocephalic and atraumatic.  Mouth/Throat: Oropharynx is clear and moist.  Partial cerumen impaction bilaterally   Cleared after simple irrigation  Hearing to soft sound improved TMs nl appearing     Eyes: Conjunctivae and EOM are normal. Pupils are equal, round, and reactive to light.  Neck: Normal range of motion.  Lymphadenopathy:    She has no cervical adenopathy.  Skin: Skin is warm and dry. No rash noted. No erythema.  Psychiatric: She has a normal mood and affect.          Assessment & Plan:   Problem List Items Addressed This Visit      Nervous and Auditory   Cerumen impaction - Primary    Much improved after simple irrigation-also with improved hearing Pt tolerated it well   inst to keep using debrox twice monthly to control cerumen   F/u if needed

## 2016-10-17 ENCOUNTER — Emergency Department: Payer: Commercial Managed Care - HMO

## 2016-10-17 DIAGNOSIS — E039 Hypothyroidism, unspecified: Secondary | ICD-10-CM | POA: Diagnosis not present

## 2016-10-17 DIAGNOSIS — Y929 Unspecified place or not applicable: Secondary | ICD-10-CM | POA: Diagnosis not present

## 2016-10-17 DIAGNOSIS — S199XXA Unspecified injury of neck, initial encounter: Secondary | ICD-10-CM | POA: Diagnosis not present

## 2016-10-17 DIAGNOSIS — Y999 Unspecified external cause status: Secondary | ICD-10-CM | POA: Insufficient documentation

## 2016-10-17 DIAGNOSIS — S82045A Nondisplaced comminuted fracture of left patella, initial encounter for closed fracture: Secondary | ICD-10-CM | POA: Insufficient documentation

## 2016-10-17 DIAGNOSIS — W1800XA Striking against unspecified object with subsequent fall, initial encounter: Secondary | ICD-10-CM | POA: Diagnosis not present

## 2016-10-17 DIAGNOSIS — R55 Syncope and collapse: Secondary | ICD-10-CM | POA: Diagnosis not present

## 2016-10-17 DIAGNOSIS — N390 Urinary tract infection, site not specified: Secondary | ICD-10-CM | POA: Insufficient documentation

## 2016-10-17 DIAGNOSIS — S0990XA Unspecified injury of head, initial encounter: Secondary | ICD-10-CM | POA: Diagnosis not present

## 2016-10-17 DIAGNOSIS — Z79899 Other long term (current) drug therapy: Secondary | ICD-10-CM | POA: Diagnosis not present

## 2016-10-17 DIAGNOSIS — Y939 Activity, unspecified: Secondary | ICD-10-CM | POA: Insufficient documentation

## 2016-10-17 DIAGNOSIS — Z87891 Personal history of nicotine dependence: Secondary | ICD-10-CM | POA: Diagnosis not present

## 2016-10-17 DIAGNOSIS — S8992XA Unspecified injury of left lower leg, initial encounter: Secondary | ICD-10-CM | POA: Diagnosis present

## 2016-10-17 DIAGNOSIS — I10 Essential (primary) hypertension: Secondary | ICD-10-CM | POA: Insufficient documentation

## 2016-10-17 DIAGNOSIS — S82002A Unspecified fracture of left patella, initial encounter for closed fracture: Secondary | ICD-10-CM | POA: Diagnosis not present

## 2016-10-17 LAB — BASIC METABOLIC PANEL
Anion gap: 7 (ref 5–15)
BUN: 22 mg/dL — ABNORMAL HIGH (ref 6–20)
CO2: 27 mmol/L (ref 22–32)
Calcium: 8.6 mg/dL — ABNORMAL LOW (ref 8.9–10.3)
Chloride: 103 mmol/L (ref 101–111)
Creatinine, Ser: 0.75 mg/dL (ref 0.44–1.00)
GFR calc Af Amer: >60 mL/min (ref >60)
GFR calc non Af Amer: >60 mL/min (ref >60)
Glucose, Bld: 101 mg/dL — ABNORMAL HIGH (ref 65–99)
Potassium: 3.8 mmol/L (ref 3.5–5.1)
Sodium: 137 mmol/L (ref 135–145)

## 2016-10-17 LAB — URINALYSIS, COMPLETE (UACMP) WITH MICROSCOPIC
Bacteria, UA: NONE SEEN
Bilirubin Urine: NEGATIVE
Glucose, UA: NEGATIVE mg/dL
Ketones, ur: 5 mg/dL — AB
Nitrite: NEGATIVE
Protein, ur: NEGATIVE mg/dL
Specific Gravity, Urine: 1.02 (ref 1.005–1.030)
pH: 5 (ref 5.0–8.0)

## 2016-10-17 LAB — CBC
HCT: 35.1 % (ref 35.0–47.0)
Hemoglobin: 12.4 g/dL (ref 12.0–16.0)
MCH: 32.5 pg (ref 26.0–34.0)
MCHC: 35.4 g/dL (ref 32.0–36.0)
MCV: 91.7 fL (ref 80.0–100.0)
Platelets: 232 10*3/uL (ref 150–440)
RBC: 3.83 MIL/uL (ref 3.80–5.20)
RDW: 15.5 % — ABNORMAL HIGH (ref 11.5–14.5)
WBC: 10.1 10*3/uL (ref 3.6–11.0)

## 2016-10-17 NOTE — ED Notes (Signed)
Pt assisted out of car to wheelchair; pt says she got dizzy and fell; c/o pain to left knee and left hand; reports still having some dizziness

## 2016-10-17 NOTE — ED Triage Notes (Signed)
Patient here due to left knee pain after a fall.  Fall caused due to feeling dizzy.

## 2016-10-18 ENCOUNTER — Emergency Department: Payer: Commercial Managed Care - HMO

## 2016-10-18 ENCOUNTER — Emergency Department
Admission: EM | Admit: 2016-10-18 | Discharge: 2016-10-18 | Disposition: A | Discharge status: Home / Self Care | Payer: Commercial Managed Care - HMO | Attending: Emergency Medicine | Admitting: Emergency Medicine

## 2016-10-18 DIAGNOSIS — S0990XA Unspecified injury of head, initial encounter: Secondary | ICD-10-CM | POA: Diagnosis not present

## 2016-10-18 DIAGNOSIS — S199XXA Unspecified injury of neck, initial encounter: Secondary | ICD-10-CM | POA: Diagnosis not present

## 2016-10-18 DIAGNOSIS — S82002A Unspecified fracture of left patella, initial encounter for closed fracture: Secondary | ICD-10-CM

## 2016-10-18 DIAGNOSIS — R55 Syncope and collapse: Secondary | ICD-10-CM

## 2016-10-18 DIAGNOSIS — N390 Urinary tract infection, site not specified: Secondary | ICD-10-CM

## 2016-10-18 LAB — TROPONIN I: Troponin I: <0.03 ng/mL (ref <0.03)

## 2016-10-18 MED ORDER — CEPHALEXIN 500 MG PO CAPS: 500.0000 mg | ORAL_CAPSULE | Freq: Three times a day (TID) | ORAL | 0 refills | Status: AC

## 2016-10-18 NOTE — ED Provider Notes (Signed)
[facility] Emergency Department Provider Note  ____________________________________________   First MD Initiated Contact with Patient 10/18/16 [phone]     (approximate)  I have reviewed the triage vital signs and the nursing notes.   HISTORY  Chief Complaint Dizziness; Fall; and Knee Pain   HPI [name] [name] is a 71 y.o. female with a history of hypertension and multiple episodes of syncope was presented to the emergency department today after single episode at 1:00 today. She says that she became dizzy at the time of the episode and then fell to the ground. She says that she is now having left knee pain. Says that she may have hit her head or her neck but denies any headache. However, she does say that she has midline cervical spine pain at this time it hurts when she moves her head from side-to-side. She denies any chest pain or palpitations prior to the episode. Says that she has had multiple episodes similar to this in the past that have been attributed to a high blood pressure. She says that she is also been urinating more frequently lately. Has had UTIs in the past as well but has not associated them with episodes of passing out like this. She suspects she may have been passed out for about 5-10 minutes. Due to the concern of her family as well as not being able to ambulate well for the rest of the day she came to the emergency department for further evaluation. Denies any weakness or dizziness at this time.   Past Medical History:  Diagnosis Date  . Depression   . Hypothyroidism   . Osteoarthritis   . Osteoporosis   . Shingles    arm 1/12  . Sleep disorder   . Tobacco abuse    past; quit 09    Patient Active Problem List   Diagnosis Date Noted  . Routine general medical examination at a health care facility 06/27/2016  . Abdominal bloating 06/15/2016  . Cerumen impaction 06/15/2016  . Dyspepsia 06/04/2016  . Loss of weight 06/04/2016  . Cough  04/26/2016  . History of vertebral compression fracture 01/19/2016  . Grief reaction 08/08/2015  . Dysphagia 06/28/2014  . Sebaceous cyst 01/08/2013  . Chronic foot pain 10/27/2011  . Low back pain 04/14/2011  . FEVER BLISTER 11/17/2010  . Essential hypertension 08/18/2009  . BUNION 03/04/2009  . Allergic rhinitis 01/10/2008  . TOBACCO USE, QUIT 02/10/2007  . GOITER, MULTINODULAR 02/09/2007  . Hypothyroidism 02/09/2007  . HYPERCHOLESTEROLEMIA 02/09/2007  . DEPRESSION 02/09/2007  . OSTEOARTHRITIS 02/09/2007  . Osteoporosis 02/09/2007  . Disturbance in sleep behavior 02/09/2007    Past Surgical History:  Procedure Laterality Date  . BIOPSY THYROID     pos neoplasm, surgery 09/2006  . bunions  06/1998  . CXR-copd, ts partial compression fracture  12/2006  . dexa  12/1996  . hashimoto  02/1997  . hyerectomy- cervical ca cells    . left wrist fracture     surgery  . right thyroid lobectomy, goiter, thyroiditis  12/2006  . stress fractures foot    . THYROIDECTOMY  11/2009    Prior to Admission medications   Medication Sig Start Date End Date Taking? Authorizing Provider  albuterol (PROVENTIL HFA;VENTOLIN HFA) 108 (90 BASE) MCG/ACT inhaler Inhale 2 puffs into the lungs every 4 (four) hours as needed for wheezing. 06/28/14  Yes Abner Greenspan, MD  atorvastatin (LIPITOR) 40 MG tablet TAKE 1 TABLET EVERY DAY 08/03/16  Yes Roque Lias  A Tower, MD  calcitonin, salmon, (MIACALCIN) 200 UNIT/ACT nasal spray Place 1 spray into alternate nostrils daily. 07/09/16  Yes Abner Greenspan, MD  desipramine (NOPRAMIN) 100 MG tablet Take 100 mg by mouth at bedtime.   Yes Historical Provider, MD  levothyroxine (SYNTHROID, LEVOTHROID) 100 MCG tablet Take 1 tablet (100 mcg total) by mouth daily. 07/09/16  Yes Abner Greenspan, MD  lisinopril (PRINIVIL,ZESTRIL) 10 MG tablet Take 0.5 tablets (5 mg total) by mouth daily. 07/09/16  Yes Abner Greenspan, MD  loratadine (CLARITIN) 10 MG tablet Take 1 tablet (10 mg total) by mouth  daily. 04/26/16  Yes Tonia Ghent, MD  mupirocin ointment (BACTROBAN) 2 % Apply to affected area twice daily 01/02/14  Yes Abner Greenspan, MD  nortriptyline (PAMELOR) 25 MG capsule TAKE 1 CAPSULE AT BEDTIME WITH A 75 MG CAPSULE 08/03/16  Yes Abner Greenspan, MD  nortriptyline (PAMELOR) 75 MG capsule TAKE 1 CAPSULE AT BEDTIME 08/03/16  Yes Abner Greenspan, MD  raloxifene (EVISTA) 60 MG tablet Take 1 tablet (60 mg total) by mouth daily. 07/09/16  Yes Marne A Tower, MD  temazepam (RESTORIL) 30 MG capsule Take 1 capsule (30 mg total) by mouth at bedtime. 05/11/16  Yes Abner Greenspan, MD  valACYclovir (VALTREX) 1000 MG tablet Take 2 pills by mouth twice daily for one day as needed for cold sore 01/02/14  Yes Abner Greenspan, MD  Calcium Carbonate-Vit D-Min (CALCIUM 1200 PO) Take by mouth daily.      Historical Provider, MD    Allergies Fosamax [alendronate sodium]  Family History  Problem Relation Age of Onset  . Stroke Father   . Hypertension Father   . Diabetes Mother   . Coronary artery disease Mother     Social History Social History  Substance Use Topics  . Smoking status: Former Research scientist (life sciences)  . Smokeless tobacco: Never Used     Comment: Quit 10/09  . Alcohol use No    Review of Systems Constitutional: No fever/chills Eyes: No visual changes. ENT: No sore throat. Cardiovascular: Denies chest pain. Respiratory: Denies shortness of breath. Gastrointestinal: No abdominal pain.  No nausea, no vomiting.  No diarrhea.  No constipation. Genitourinary: Negative for dysuria. Musculoskeletal: Negative for back pain. Skin: Negative for rash. Neurological: Negative for headaches, focal weakness or numbness.  10-point ROS otherwise negative.  ____________________________________________   PHYSICAL EXAM:  VITAL SIGNS: ED Triage Vitals  Enc Vitals Group     BP 10/17/16 2156 121/60     Pulse Rate 10/17/16 2156 89     Resp 10/17/16 2156 18     Temp 10/17/16 2156 98.7 F (37.1 C)     Temp  Source 10/17/16 2156 Oral     SpO2 10/17/16 2156 95 %     Weight 10/17/16 2150 120 lb (54.4 kg)     Height 10/17/16 2150 5\' 3"  (1.6 m)     Head Circumference --      Peak Flow --      Pain Score 10/17/16 2150 10     Pain Loc --      Pain Edu? --      Excl. in [place]? --     Constitutional: Alert and oriented. Well appearing and in no acute distress. Eyes: Conjunctivae are normal. PERRL. EOMI. Head: Atraumatic. Nose: No congestion/rhinnorhea. Mouth/Throat: Mucous membranes are moist.   Neck: No stridor.  No tenderness palpation of midline cervical spine. No deformity or step-off. Cardiovascular: Normal rate, regular rhythm. Grossly  normal heart sounds.  Respiratory: Normal respiratory effort.  No retractions. Lungs CTAB. Gastrointestinal: Soft and nontender. No distention.  Musculoskeletal: Tenderness palpation over the medial aspect of the left patella without any swelling. No joint effusion. Tenderness palpation as well to the medial and lateral aspects of the femur and tibia. No ligamentous laxity. Otherwise neurovascularly intact. Neurologic:  Normal speech and language. No gross focal neurologic deficits are appreciated. Skin:  Skin is warm, dry and intact. No rash noted. Psychiatric: Mood and affect are normal. Speech and behavior are normal.  ____________________________________________   LABS (all labs ordered are listed, but only abnormal results are displayed)  Labs Reviewed  BASIC METABOLIC PANEL - Abnormal; Notable for the following:       Result Value   Glucose, Bld 101 (*)    BUN 22 (*)    Calcium 8.6 (*)    All other components within normal limits  CBC - Abnormal; Notable for the following:    RDW 15.5 (*)    All other components within normal limits  URINALYSIS, COMPLETE (UACMP) WITH MICROSCOPIC - Abnormal; Notable for the following:    Color, Urine YELLOW (*)    APPearance CLEAR (*)    Hgb urine dipstick SMALL (*)    Ketones, ur 5 (*)    Leukocytes, UA TRACE  (*)    Squamous Epithelial / LPF 0-5 (*)    All other components within normal limits  URINE CULTURE  TROPONIN I   ____________________________________________  EKG  ED ECG REPORT I, Doran Stabler, the attending physician, personally viewed and interpreted this ECG.   Date: 10/18/2016  EKG Time: 2152  Rate: 93  Rhythm: normal sinus rhythm  Axis: Normal axis  Intervals:none  ST&T Change: No ST segment elevation or depression. No abnormal T-wave inversion.  ____________________________________________  RADIOLOGY DG Knee Complete 4 Views Left (Final result)  Result time 10/17/16 22:35:32  Final result by Inez Catalina, MD (10/17/16 22:35:32)           Narrative:   CLINICAL DATA: Recent fall with anterior knee pain, initial encounter  EXAM: LEFT KNEE - COMPLETE 4+ VIEW  COMPARISON: None.  FINDINGS: There is a mildly comminuted fracture of the patella along the anteromedial aspect extending posteriorly. No significant separation of the fracture fragments is seen. No joint effusion is noted.  IMPRESSION: Patellar fracture as described.   Electronically Signed By: Inez Catalina M.D.           CT Head Wo Contrast (Final result)  Result time 10/18/16 03:23:51  Final result by Kristine Garbe, MD (10/18/16 03:23:51)           Narrative:   CLINICAL DATA: 71 y/o F; dizziness and fall.  EXAM: CT HEAD WITHOUT CONTRAST  CT CERVICAL SPINE WITHOUT CONTRAST  TECHNIQUE: Multidetector CT imaging of the head and cervical spine was performed following the standard protocol without intravenous contrast. Multiplanar CT image reconstructions of the cervical spine were also generated.  COMPARISON: 12/11/2013 CT cervical spine and CT head.  FINDINGS: CT HEAD FINDINGS  Brain: No evidence of acute infarction, hemorrhage, hydrocephalus, extra-axial collection or mass lesion/mass effect. Mild chronic microvascular ischemic changes of white  matter.  Vascular: Calcific atherosclerosis of cavernous and paraclinoid internal carotid arteries.  Skull: Normal. Negative for fracture or focal lesion.  Sinuses/Orbits: No acute finding.  Other: None.  CT CERVICAL SPINE FINDINGS  Alignment: Normal.  Skull base and vertebrae: No acute fracture. No primary bone lesion or focal pathologic process.  Soft tissues and spinal canal: No prevertebral fluid or swelling. No visible canal hematoma.  Disc levels: Minimal cervical spondylosis. No high-grade canal stenosis or foraminal narrowing.  Upper chest: Biapical pleuroparenchymal scarring.  Other: Thyroidectomy. Large left-sided laryngocele. Calcific atherosclerosis of cavernous internal carotid arteries.  IMPRESSION: 1. No acute intracranial abnormality or displaced calvarial fracture. 2. No acute fracture or dislocation of the cervical spine. 3. Mild chronic microvascular ischemic changes of the brain. 4. Thyroidectomy and left-sided laryngocele.   Electronically Signed By: Kristine Garbe M.D. On: 10/18/2016 03:23            CT Cervical Spine Wo Contrast (Final result)  Result time 10/18/16 03:23:51  Final result by Kristine Garbe, MD (10/18/16 03:23:51)           Narrative:   CLINICAL DATA: 71 y/o F; dizziness and fall.  EXAM: CT HEAD WITHOUT CONTRAST  CT CERVICAL SPINE WITHOUT CONTRAST  TECHNIQUE: Multidetector CT imaging of the head and cervical spine was performed following the standard protocol without intravenous contrast. Multiplanar CT image reconstructions of the cervical spine were also generated.  COMPARISON: 12/11/2013 CT cervical spine and CT head.  FINDINGS: CT HEAD FINDINGS  Brain: No evidence of acute infarction, hemorrhage, hydrocephalus, extra-axial collection or mass lesion/mass effect. Mild chronic microvascular ischemic changes of white matter.  Vascular: Calcific atherosclerosis of cavernous and  paraclinoid internal carotid arteries.  Skull: Normal. Negative for fracture or focal lesion.  Sinuses/Orbits: No acute finding.  Other: None.  CT CERVICAL SPINE FINDINGS  Alignment: Normal.  Skull base and vertebrae: No acute fracture. No primary bone lesion or focal pathologic process.  Soft tissues and spinal canal: No prevertebral fluid or swelling. No visible canal hematoma.  Disc levels: Minimal cervical spondylosis. No high-grade canal stenosis or foraminal narrowing.  Upper chest: Biapical pleuroparenchymal scarring.  Other: Thyroidectomy. Large left-sided laryngocele. Calcific atherosclerosis of cavernous internal carotid arteries.  IMPRESSION: 1. No acute intracranial abnormality or displaced calvarial fracture. 2. No acute fracture or dislocation of the cervical spine. 3. Mild chronic microvascular ischemic changes of the brain. 4. Thyroidectomy and left-sided laryngocele.   Electronically Signed By: Kristine Garbe M.D. On: 10/18/2016 03:23             ____________________________________________   PROCEDURES  Procedure(s) performed:   Procedures  Critical Care performed:   ____________________________________________   INITIAL IMPRESSION / ASSESSMENT AND PLAN / ED COURSE  Pertinent labs & imaging results that were available during my care of the patient were reviewed by me and considered in my medical decision making (see chart for details).  ----------------------------------------- 4:26 AM on 10/18/2016 -----------------------------------------  Patient with possible UTI. Increased urinary frequency. Will treat. Also in a mobilizer and able to ambulate with a walker. Patient without any complaints of immobilizer into tighter painful. Patient to follow-up with orthopedics. Likely repeat episode of syncope similar to previous episodes. Reassuring lab work except the urine which will be treated. Patient understands the plan  and is willing to comply.      ____________________________________________   FINAL CLINICAL IMPRESSION(S) / ED DIAGNOSES  Left patellar fracture.    NEW MEDICATIONS STARTED DURING THIS VISIT:  New Prescriptions   No medications on file     Note:  This document was prepared using Dragon voice recognition software and may include unintentional dictation errors.    Orbie Pyo, MD 10/18/16 [phone]

## 2016-10-18 NOTE — ED Notes (Signed)
Up ambulating with use of walker.  Patient reports able to ambulate well and with assistance other than walker.  MD notified.

## 2016-10-18 NOTE — ED Notes (Signed)
Resting quietly, awaiting results.

## 2016-10-18 NOTE — ED Notes (Signed)
Patient reports became dizzy and fell landing on her knee.  Patient complains of right knee pain.

## 2016-10-18 NOTE — ED Notes (Addendum)
Discharge instructions reviewed with patient. Questions fielded by this RN. Patient verbalizes understanding of instructions. Patient discharged home in stable condition per DR [facility]. No acute distress noted at time of discharge.   [employer] $4 dollar list of ABX given.

## 2016-10-19 LAB — URINE CULTURE: Culture: <10000 — AB

## 2016-10-28 DIAGNOSIS — S82002A Unspecified fracture of left patella, initial encounter for closed fracture: Secondary | ICD-10-CM | POA: Diagnosis not present

## 2016-11-01 ENCOUNTER — Ambulatory Visit (INDEPENDENT_AMBULATORY_CARE_PROVIDER_SITE_OTHER): Payer: Medicare HMO | Admitting: Primary Care

## 2016-11-01 ENCOUNTER — Encounter: Payer: Self-pay | Admitting: Primary Care

## 2016-11-01 VITALS — BP 140/82 | HR 73 | Temp 98.0°F | Ht 63.5 in | Wt 117.8 lb

## 2016-11-01 DIAGNOSIS — R05 Cough: Secondary | ICD-10-CM

## 2016-11-01 DIAGNOSIS — R059 Cough, unspecified: Secondary | ICD-10-CM

## 2016-11-01 NOTE — Progress Notes (Signed)
Pre visit review using our clinic review tool, if applicable. No additional management support is needed unless otherwise documented below in the visit note. 

## 2016-11-01 NOTE — Patient Instructions (Signed)
Your symptoms are representative of a viral illness which will resolve on its own over time. Our goal is to treat your symptoms in order to aid your body in the healing process and to make you more comfortable.   Cough/Congestion: Try taking Mucinex DM. This will help loosen up the mucous in your chest. Ensure you take this medication with a full glass of water.  You could also try taking plain Robitussin or Delsym for cough.  You may also take Ibuprofen as needed for headaches and sore throat.  Please notify me if you develop persistent fevers of 101, start coughing up green mucous, notice increased fatigue or weakness, or feel worse after 1 week of onset of symptoms.   Increase consumption of water intake and rest.  It was a pleasure meeting you!   Upper Respiratory Infection, Adult Most upper respiratory infections (URIs) are a viral infection of the air passages leading to the lungs. A URI affects the nose, throat, and upper air passages. The most common type of URI is nasopharyngitis and is typically referred to as "the common cold." URIs run their course and usually go away on their own. Most of the time, a URI does not require medical attention, but sometimes a bacterial infection in the upper airways can follow a viral infection. This is called a secondary infection. Sinus and middle ear infections are common types of secondary upper respiratory infections. Bacterial pneumonia can also complicate a URI. A URI can worsen asthma and chronic obstructive pulmonary disease (COPD). Sometimes, these complications can require emergency medical care and may be life threatening. What are the causes? Almost all URIs are caused by viruses. A virus is a type of germ and can spread from one person to another. What increases the risk? You may be at risk for a URI if:  You smoke.  You have chronic heart or lung disease.  You have a weakened defense (immune) system.  You are very young or very  old.  You have nasal allergies or asthma.  You work in crowded or poorly ventilated areas.  You work in health care facilities or schools. What are the signs or symptoms? Symptoms typically develop 2-3 days after you come in contact with a cold virus. Most viral URIs last 7-10 days. However, viral URIs from the influenza virus (flu virus) can last 14-18 days and are typically more severe. Symptoms may include:  Runny or stuffy (congested) nose.  Sneezing.  Cough.  Sore throat.  Headache.  Fatigue.  Fever.  Loss of appetite.  Pain in your forehead, behind your eyes, and over your cheekbones (sinus pain).  Muscle aches. How is this diagnosed? Your health care provider may diagnose a URI by:  Physical exam.  Tests to check that your symptoms are not due to another condition such as:  Strep throat.  Sinusitis.  Pneumonia.  Asthma. How is this treated? A URI goes away on its own with time. It cannot be cured with medicines, but medicines may be prescribed or recommended to relieve symptoms. Medicines may help:  Reduce your fever.  Reduce your cough.  Relieve nasal congestion. Follow these instructions at home:  Take medicines only as directed by your health care provider.  Gargle warm saltwater or take cough drops to comfort your throat as directed by your health care provider.  Use a warm mist humidifier or inhale steam from a shower to increase air moisture. This may make it easier to breathe.  Drink enough fluid  to keep your urine clear or pale yellow.  Eat soups and other clear broths and maintain good nutrition.  Rest as needed.  Return to work when your temperature has returned to normal or as your health care provider advises. You may need to stay home longer to avoid infecting others. You can also use a face mask and careful hand washing to prevent spread of the virus.  Increase the usage of your inhaler if you have asthma.  Do not use any  tobacco products, including cigarettes, chewing tobacco, or electronic cigarettes. If you need help quitting, ask your health care provider. How is this prevented? The best way to protect yourself from getting a cold is to practice good hygiene.  Avoid oral or hand contact with people with cold symptoms.  Wash your hands often if contact occurs. There is no clear evidence that vitamin C, vitamin E, echinacea, or exercise reduces the chance of developing a cold. However, it is always recommended to get plenty of rest, exercise, and practice good nutrition. Contact a health care provider if:  You are getting worse rather than better.  Your symptoms are not controlled by medicine.  You have chills.  You have worsening shortness of breath.  You have brown or red mucus.  You have yellow or brown nasal discharge.  You have pain in your face, especially when you bend forward.  You have a fever.  You have swollen neck glands.  You have pain while swallowing.  You have white areas in the back of your throat. Get help right away if:  You have severe or persistent:  Headache.  Ear pain.  Sinus pain.  Chest pain.  You have chronic lung disease and any of the following:  Wheezing.  Prolonged cough.  Coughing up blood.  A change in your usual mucus.  You have a stiff neck.  You have changes in your:  Vision.  Hearing.  Thinking.  Mood. This information is not intended to replace advice given to you by your health care provider. Make sure you discuss any questions you have with your health care provider. Document Released: 02/16/2001 Document Revised: 04/25/2016 Document Reviewed: 11/28/2013 Elsevier Interactive Patient Education  2017 [employer].

## 2016-11-01 NOTE — Progress Notes (Signed)
Subjective:    Patient ID: [name] [name], female    DOB: 1946/08/12, 71 y.o.   MRN: [id number]  HPI  Ms. Deridder is a 71 year old female with a history of allergic rhinitis, hypertension who presents today with a chief complaint sore throat. She also reports chills, nasal congestion, cough, chest congestion. Her symptoms began 1 day ago. She's not taken anything OTC for her symptoms. Her cough is mostly dry. She denies fevers, sick contacts.   Review of Systems  Constitutional: Negative for chills, fatigue and fever.  HENT: Positive for congestion, postnasal drip and sore throat. Negative for ear pain and sinus pressure.   Respiratory: Positive for cough. Negative for shortness of breath and wheezing.   Cardiovascular: Negative for chest pain.  Allergic/Immunologic: Positive for environmental allergies.       Past Medical History:  Diagnosis Date  . Depression   . Hypothyroidism   . Osteoarthritis   . Osteoporosis   . Shingles    arm 1/12  . Sleep disorder   . Tobacco abuse    past; quit 09     Social History   Social History  . Marital status: Married    Spouse name: N/A  . Number of children: N/A  . Years of education: N/A   Occupational History  . Not on file.   Social History Main Topics  . Smoking status: Former Research scientist (life sciences)  . Smokeless tobacco: Never Used     Comment: Quit 10/09  . Alcohol use No  . Drug use: No  . Sexual activity: Not on file   Other Topics Concern  . Not on file   Social History Narrative   Marital Status: widowed.    3 children   Lost husband to throat cancer in 8/16 .     Past Surgical History:  Procedure Laterality Date  . BIOPSY THYROID     pos neoplasm, surgery 09/2006  . bunions  06/1998  . CXR-copd, ts partial compression fracture  12/2006  . dexa  12/1996  . hashimoto  02/1997  . hyerectomy- cervical ca cells    . left wrist fracture     surgery  . right thyroid lobectomy, goiter, thyroiditis  12/2006  . stress fractures  foot    . THYROIDECTOMY  11/2009    Family History  Problem Relation Age of Onset  . Stroke Father   . Hypertension Father   . Diabetes Mother   . Coronary artery disease Mother     Allergies  Allergen Reactions  . Fosamax [Alendronate Sodium]     Dysphagia and heartburn     Current Outpatient Prescriptions on File Prior to Visit  Medication Sig Dispense Refill  . albuterol (PROVENTIL HFA;VENTOLIN HFA) 108 (90 BASE) MCG/ACT inhaler Inhale 2 puffs into the lungs every 4 (four) hours as needed for wheezing. 1 Inhaler 11  . atorvastatin (LIPITOR) 40 MG tablet TAKE 1 TABLET EVERY DAY 90 tablet 3  . calcitonin, salmon, (MIACALCIN) 200 UNIT/ACT nasal spray Place 1 spray into alternate nostrils daily. 3.7 mL 3  . Calcium Carbonate-Vit D-Min (CALCIUM 1200 PO) Take by mouth daily.      Marland Kitchen desipramine (NOPRAMIN) 100 MG tablet Take 100 mg by mouth at bedtime.    Marland Kitchen levothyroxine (SYNTHROID, LEVOTHROID) 100 MCG tablet Take 1 tablet (100 mcg total) by mouth daily. 90 tablet 3  . lisinopril (PRINIVIL,ZESTRIL) 10 MG tablet Take 0.5 tablets (5 mg total) by mouth daily. 45 tablet 3  . loratadine (  CLARITIN) 10 MG tablet Take 1 tablet (10 mg total) by mouth daily.    . mupirocin ointment (BACTROBAN) 2 % Apply to affected area twice daily 15 g 1  . nortriptyline (PAMELOR) 25 MG capsule TAKE 1 CAPSULE AT BEDTIME WITH A 75 MG CAPSULE 90 capsule 3  . nortriptyline (PAMELOR) 75 MG capsule TAKE 1 CAPSULE AT BEDTIME 90 capsule 3  . raloxifene (EVISTA) 60 MG tablet Take 1 tablet (60 mg total) by mouth daily. 90 tablet 3  . temazepam (RESTORIL) 30 MG capsule Take 1 capsule (30 mg total) by mouth at bedtime. 90 capsule 1  . valACYclovir (VALTREX) 1000 MG tablet Take 2 pills by mouth twice daily for one day as needed for cold sore (Patient not taking: Reported on 11/01/2016) 4 tablet 5   No current facility-administered medications on file prior to visit.     BP 140/82   Pulse 73   Temp 98 F (36.7 C) (Oral)    Ht 5' 3.5" (1.613 m)   Wt 117 lb 12.8 oz (53.4 kg)   SpO2 98%   BMI 20.54 kg/m    Objective:   Physical Exam  Constitutional: She appears well-nourished.  HENT:  Right Ear: Tympanic membrane and ear canal normal.  Left Ear: Tympanic membrane and ear canal normal.  Nose: Right sinus exhibits no maxillary sinus tenderness and no frontal sinus tenderness. Left sinus exhibits no maxillary sinus tenderness and no frontal sinus tenderness.  Mouth/Throat: Oropharynx is clear and moist.  Eyes: Conjunctivae are normal.  Neck: Neck supple.  Cardiovascular: Normal rate and regular rhythm.   Pulmonary/Chest: Effort normal and breath sounds normal. She has no wheezes. She has no rales.  Lymphadenopathy:    She has no cervical adenopathy.  Skin: Skin is warm and dry.          Assessment & Plan:  URI:  Cough, chest congestion, fatigue x 24 hours. Not taken anything OTC. Exam today with clear lungs, does not appear acutely ill, vitals stable. Suspect viral vs allergy involvement and will treat with supportive measures. Discussed use of Mucinex, Robitussin/Delsym, Ibuprofen. Fluids, rest, follow up PRN. Return precautions provided.   Sheral Flow, NP

## 2016-11-05 ENCOUNTER — Other Ambulatory Visit: Payer: Self-pay | Admitting: Family Medicine

## 2016-11-05 MED ORDER — TEMAZEPAM 30 MG PO CAPS: 30.0000 mg | ORAL_CAPSULE | Freq: Every day | ORAL | 1 refills | Status: DC

## 2016-11-05 NOTE — Telephone Encounter (Signed)
Px written for call in   

## 2016-11-05 NOTE — Telephone Encounter (Signed)
Printed to fax Thanks

## 2016-11-05 NOTE — Telephone Encounter (Signed)
Pt had a CPE on 07/09/16 and she has had a few acute appts since that too. Last filled on 05/11/16 #90 caps with 1 additional refill, please advise

## 2016-11-05 NOTE — Telephone Encounter (Signed)
Rx faxed to mail order pharmacy. 

## 2016-11-05 NOTE — Telephone Encounter (Signed)
Rx needs to be paper due to it being mail order

## 2016-11-08 ENCOUNTER — Other Ambulatory Visit: Payer: Self-pay | Admitting: Family Medicine

## 2016-11-09 MED ORDER — LEVOTHYROXINE SODIUM 100 MCG PO TABS
100.0000 ug | ORAL_TABLET | Freq: Every day | ORAL | 1 refills | Status: DC
Start: 2016-11-09 — End: 2017-02-14

## 2016-11-21 ENCOUNTER — Emergency Department: Payer: Medicare HMO

## 2016-11-21 ENCOUNTER — Emergency Department
Admission: EM | Admit: 2016-11-21 | Discharge: 2016-11-21 | Disposition: A | Discharge status: Home / Self Care | Payer: Medicare HMO | Attending: Emergency Medicine | Admitting: Emergency Medicine

## 2016-11-21 ENCOUNTER — Encounter: Payer: Self-pay | Admitting: Emergency Medicine

## 2016-11-21 DIAGNOSIS — I10 Essential (primary) hypertension: Secondary | ICD-10-CM | POA: Diagnosis not present

## 2016-11-21 DIAGNOSIS — Z87891 Personal history of nicotine dependence: Secondary | ICD-10-CM | POA: Diagnosis not present

## 2016-11-21 DIAGNOSIS — J40 Bronchitis, not specified as acute or chronic: Secondary | ICD-10-CM | POA: Diagnosis not present

## 2016-11-21 DIAGNOSIS — Z79899 Other long term (current) drug therapy: Secondary | ICD-10-CM | POA: Insufficient documentation

## 2016-11-21 DIAGNOSIS — E039 Hypothyroidism, unspecified: Secondary | ICD-10-CM | POA: Insufficient documentation

## 2016-11-21 DIAGNOSIS — R05 Cough: Secondary | ICD-10-CM | POA: Diagnosis not present

## 2016-11-21 MED ORDER — PREDNISONE 10 MG (21) PO TBPK: ORAL_TABLET | ORAL | 0 refills | Status: DC

## 2016-11-21 MED ORDER — AZITHROMYCIN 250 MG PO TABS: ORAL_TABLET | ORAL | 0 refills | Status: DC

## 2016-11-21 NOTE — ED Notes (Signed)
See triage note   States she has had intermittent fever with cough for about 3 weeks afebrile on arrival

## 2016-11-21 NOTE — ED Provider Notes (Signed)
[facility] Emergency Department Provider Note  ____________________________________________  Time seen: Approximately 10:14 AM  I have reviewed the triage vital signs and the nursing notes.   HISTORY  Chief Complaint Cough and Nasal Congestion    HPI [name] [name] is a 71 y.o. female that presents to the emergency department with congestion and productive cough for 3 weeks. Patient states that she's had a fever off and on. Patient is coughing up thick green sputum. Patient went to her primary care provider and was given Robitussin. She states that her symptoms have worsened since. Patient smokes a half a pack a day since she was 69. She has a history of asthma and COPD. Patient denies shortness of breath, chest pain, nausea, vomiting, abdominal pain, diarrhea, constipation.   Past Medical History:  Diagnosis Date  . Depression   . Hypothyroidism   . Osteoarthritis   . Osteoporosis   . Shingles    arm 1/12  . Sleep disorder   . Tobacco abuse    past; quit 09    Patient Active Problem List   Diagnosis Date Noted  . Routine general medical examination at a health care facility 06/27/2016  . Abdominal bloating 06/15/2016  . Cerumen impaction 06/15/2016  . Dyspepsia 06/04/2016  . Loss of weight 06/04/2016  . Cough 04/26/2016  . History of vertebral compression fracture 01/19/2016  . Grief reaction 08/08/2015  . Dysphagia 06/28/2014  . Sebaceous cyst 01/08/2013  . Chronic foot pain 10/27/2011  . Low back pain 04/14/2011  . FEVER BLISTER 11/17/2010  . Essential hypertension 08/18/2009  . BUNION 03/04/2009  . Allergic rhinitis 01/10/2008  . TOBACCO USE, QUIT 02/10/2007  . GOITER, MULTINODULAR 02/09/2007  . Hypothyroidism 02/09/2007  . HYPERCHOLESTEROLEMIA 02/09/2007  . DEPRESSION 02/09/2007  . OSTEOARTHRITIS 02/09/2007  . Osteoporosis 02/09/2007  . Disturbance in sleep behavior 02/09/2007    Past Surgical History:  Procedure Laterality  Date  . BIOPSY THYROID     pos neoplasm, surgery 09/2006  . bunions  06/1998  . CXR-copd, ts partial compression fracture  12/2006  . dexa  12/1996  . hashimoto  02/1997  . hyerectomy- cervical ca cells    . left wrist fracture     surgery  . right thyroid lobectomy, goiter, thyroiditis  12/2006  . stress fractures foot    . THYROIDECTOMY  11/2009    Prior to Admission medications   Medication Sig Start Date End Date Taking? Authorizing Provider  albuterol (PROVENTIL HFA;VENTOLIN HFA) 108 (90 BASE) MCG/ACT inhaler Inhale 2 puffs into the lungs every 4 (four) hours as needed for wheezing. 06/28/14   Abner Greenspan, MD  atorvastatin (LIPITOR) 40 MG tablet TAKE 1 TABLET EVERY DAY 08/03/16   Abner Greenspan, MD  azithromycin (ZITHROMAX Z-PAK) 250 MG tablet Take 2 tablets (500 mg) on  Day 1,  followed by 1 tablet (250 mg) once daily on Days 2 through 5. 11/21/16   Laban Emperor, PA-C  calcitonin, salmon, (MIACALCIN) 200 UNIT/ACT nasal spray Place 1 spray into alternate nostrils daily. 07/09/16   Abner Greenspan, MD  Calcium Carbonate-Vit D-Min (CALCIUM 1200 PO) Take by mouth daily.      Historical Provider, MD  desipramine (NOPRAMIN) 100 MG tablet Take 100 mg by mouth at bedtime.    Historical Provider, MD  levothyroxine (SYNTHROID, LEVOTHROID) 100 MCG tablet Take 1 tablet (100 mcg total) by mouth daily before breakfast. 11/09/16   Abner Greenspan, MD  lisinopril (PRINIVIL,ZESTRIL) 10 MG tablet TAKE  1/2 TABLET EVERY DAY 11/09/16   Abner Greenspan, MD  loratadine (CLARITIN) 10 MG tablet Take 1 tablet (10 mg total) by mouth daily. 04/26/16   Tonia Ghent, MD  mupirocin ointment Drue Stager) 2 % Apply to affected area twice daily 01/02/14   Abner Greenspan, MD  nortriptyline (PAMELOR) 25 MG capsule TAKE 1 CAPSULE AT BEDTIME WITH A 75 MG CAPSULE 08/03/16   Abner Greenspan, MD  nortriptyline (PAMELOR) 75 MG capsule TAKE 1 CAPSULE AT BEDTIME 08/03/16   Abner Greenspan, MD  predniSONE (STERAPRED UNI-PAK 21 TAB) 10 MG (21)  TBPK tablet Take 6 tablets on day 1, take 5 tablets on day 2, take 4 tablets on day 3, take 3 tablets on day 4, take 2 tablets on day 5, take 1 tablet on day 6 11/21/16   Laban Emperor, PA-C  raloxifene (EVISTA) 60 MG tablet Take 1 tablet (60 mg total) by mouth daily. 07/09/16   Abner Greenspan, MD  temazepam (RESTORIL) 30 MG capsule Take 1 capsule (30 mg total) by mouth at bedtime. 11/05/16   Abner Greenspan, MD    Allergies Fosamax [alendronate sodium]  Family History  Problem Relation Age of Onset  . Stroke Father   . Hypertension Father   . Diabetes Mother   . Coronary artery disease Mother     Social History Social History  Substance Use Topics  . Smoking status: Former Research scientist (life sciences)  . Smokeless tobacco: Never Used     Comment: Quit 10/09  . Alcohol use No     Review of Systems  Constitutional: No chills Eyes: No visual changes. No discharge. ENT: Positive for congestion and rhinorrhea. Cardiovascular: No chest pain. Respiratory: Positive for cough. No SOB. Gastrointestinal: No abdominal pain.  No nausea, no vomiting.  No diarrhea.  No constipation. Musculoskeletal: Negative for musculoskeletal pain. Skin: Negative for rash, abrasions, lacerations, ecchymosis. Neurological: Negative for headaches.   ____________________________________________   PHYSICAL EXAM:  VITAL SIGNS: ED Triage Vitals [11/21/16 0955]  Enc Vitals Group     BP 130/74     Pulse Rate 96     Resp 18     Temp 98.5 F (36.9 C)     Temp Source Oral     SpO2 96 %     Weight 120 lb (54.4 kg)     Height 5\' 5"  (1.651 m)     Head Circumference      Peak Flow      Pain Score      Pain Loc      Pain Edu?      Excl. in [place]?      Constitutional: Alert and oriented. Well appearing and in no acute distress. Eyes: Conjunctivae are normal. PERRL. EOMI. No discharge. Head: Atraumatic. ENT: No frontal and maxillary sinus tenderness.      Ears: Tympanic membranes pearly gray with good landmarks. No  discharge.      Nose: Mild congestion/rhinnorhea.      Mouth/Throat: Mucous membranes are moist. Oropharynx non-erythematous. Tonsils not enlarged. No exudates. Uvula midline. Neck: No stridor.   Hematological/Lymphatic/Immunilogical: No cervical lymphadenopathy. Cardiovascular: Normal rate, regular rhythm.  Good peripheral circulation. Respiratory: Normal respiratory effort without tachypnea or retractions. Lungs CTAB. Good air entry to the bases with no decreased or absent breath sounds. Gastrointestinal: Bowel sounds 4 quadrants. Soft and nontender to palpation. No guarding or rigidity. No palpable masses. No distention. Musculoskeletal: Full range of motion to all extremities. No gross deformities appreciated. Neurologic:  Normal speech and language. No gross focal neurologic deficits are appreciated.  Skin:  Skin is warm, dry and intact. No rash noted. Psychiatric: Mood and affect are normal. Speech and behavior are normal. Patient exhibits appropriate insight and judgement.   ____________________________________________   LABS (all labs ordered are listed, but only abnormal results are displayed)  Labs Reviewed - No data to display ____________________________________________  EKG   ____________________________________________  RADIOLOGY Robinette Haines, personally viewed and evaluated these images (plain radiographs) as part of my medical decision making, as well as reviewing the written report by the radiologist.  Dg Chest 2 View  Result Date: 11/21/2016 CLINICAL DATA:  Smoker with productive cough for the past 3 weeks. COPD. EXAM: CHEST  2 VIEW COMPARISON:  06/04/2016. FINDINGS: Normal sized heart. Tortuous and calcified thoracic aorta. Hyperexpanded lungs with stable diffuse peribronchial thickening and accentuation of the interstitial markings. Stable mid to lower thoracic vertebral compression deformity. Diffuse osteopenia. IMPRESSION: 1. No acute abnormality. 2. Stable  changes of COPD and chronic bronchitis. 3. Aortic atherosclerosis. Electronically Signed   By: Claudie Revering M.D.   On: 11/21/2016 10:40    ____________________________________________    PROCEDURES  Procedure(s) performed:    Procedures    Medications - No data to display   ____________________________________________   INITIAL IMPRESSION / ASSESSMENT AND PLAN / ED COURSE  Pertinent labs & imaging results that were available during my care of the patient were reviewed by me and considered in my medical decision making (see chart for details).  Review of the [place] CSRS was performed in accordance of the [place] prior to dispensing any controlled drugs.     Patient's diagnosis is consistent with bronchitis. Vital signs and exam are reassuring. Patient appears well and is staying well hydrated.  Patient feels comfortable going home. Patient will be discharged home with prescriptions for azithromycin and prednisone. Patient is going to continue using inhalers. Patient is to follow up with PCP as needed or otherwise directed. Patient is given ED precautions to return to the ED for any worsening or new symptoms.     ____________________________________________  FINAL CLINICAL IMPRESSION(S) / ED DIAGNOSES  Final diagnoses:  Bronchitis      NEW MEDICATIONS STARTED DURING THIS VISIT:  Discharge Medication List as of 11/21/2016 10:58 AM    START taking these medications   Details  azithromycin (ZITHROMAX Z-PAK) 250 MG tablet Take 2 tablets (500 mg) on  Day 1,  followed by 1 tablet (250 mg) once daily on Days 2 through 5., Print    predniSONE (STERAPRED UNI-PAK 21 TAB) 10 MG (21) TBPK tablet Take 6 tablets on day 1, take 5 tablets on day 2, take 4 tablets on day 3, take 3 tablets on day 4, take 2 tablets on day 5, take 1 tablet on day 6, Print            This chart was dictated using voice recognition software/Dragon. Despite best efforts to proofread, errors can occur  which can change the meaning. Any change was purely unintentional.    Laban Emperor, PA-C 11/21/16 [place], MD 11/25/16 [phone]

## 2016-11-21 NOTE — ED Triage Notes (Signed)
Productive cough x 3 weeks, green thick sputum, pt states cough worse at night. Pt reports subjective fevers. Pt also states inside of mouth is sore.  Pt states her PCP gave her Robitussin. C/o nasal congestion as well.

## 2016-11-30 ENCOUNTER — Encounter: Payer: Self-pay | Admitting: Family Medicine

## 2016-11-30 ENCOUNTER — Ambulatory Visit (INDEPENDENT_AMBULATORY_CARE_PROVIDER_SITE_OTHER): Payer: Medicare HMO | Admitting: Family Medicine

## 2016-11-30 VITALS — BP 116/78 | HR 86 | Temp 98.7°F | Ht 63.5 in | Wt 116.5 lb

## 2016-11-30 DIAGNOSIS — Z8781 Personal history of (healed) traumatic fracture: Secondary | ICD-10-CM | POA: Diagnosis not present

## 2016-11-30 DIAGNOSIS — J440 Chronic obstructive pulmonary disease with acute lower respiratory infection: Secondary | ICD-10-CM

## 2016-11-30 DIAGNOSIS — I700 Atherosclerosis of aorta: Secondary | ICD-10-CM | POA: Diagnosis not present

## 2016-11-30 DIAGNOSIS — I10 Essential (primary) hypertension: Secondary | ICD-10-CM

## 2016-11-30 DIAGNOSIS — J209 Acute bronchitis, unspecified: Secondary | ICD-10-CM | POA: Diagnosis not present

## 2016-11-30 MED ORDER — BENZONATATE 200 MG PO CAPS: 200.0000 mg | ORAL_CAPSULE | Freq: Three times a day (TID) | ORAL | 1 refills | Status: DC | PRN

## 2016-11-30 NOTE — Progress Notes (Signed)
Subjective:    Patient ID: [name] [name], female    DOB: 07-29-1946, 71 y.o.   MRN: [id number]  HPI Here for f/u of ED visit for uri 11/21/16 Presented with congestion and prod cough for 3 weeks with intermittent fever in the setting of copd   Dg Chest 2 View  Result Date: 11/21/2016 CLINICAL DATA:  Smoker with productive cough for the past 3 weeks. COPD. EXAM: CHEST  2 VIEW COMPARISON:  06/04/2016. FINDINGS: Normal sized heart. Tortuous and calcified thoracic aorta. Hyperexpanded lungs with stable diffuse peribronchial thickening and accentuation of the interstitial markings. Stable mid to lower thoracic vertebral compression deformity. Diffuse osteopenia. IMPRESSION: 1. No acute abnormality. 2. Stable changes of COPD and chronic bronchitis. 3. Aortic atherosclerosis. Electronically Signed   By: Claudie Revering M.D.   On: 11/21/2016 10:40     Diagnosed with bronchitis  D/c home with azithromycin and prednisone  Continues inhalers   Wt Readings from Last 3 Encounters:  11/30/16 116 lb 8 oz (52.8 kg)  11/21/16 120 lb (54.4 kg)  11/01/16 117 lb 12.8 oz (53.4 kg)   Pulse ox is 93% on RA  BP Readings from Last 3 Encounters:  11/30/16 116/78  11/21/16 130/74  11/01/16 140/82   Not wheezing a lot  Coughs a lot - especially at night  Does prop herself up  Production has slowed down - phlegm is clearer now  Fever is gone  Still occ chills at night  Aches are improved   Using her albuterol 4 times daily  Through with her prednisone and azithro   Also fell and fractured her knee L - a while back (patella nondisplaced)  Goes to [facility] clinic  Still hurts   Thoracic calcification seen on cxr Taking lipitor for lipids   Patient Active Problem List   Diagnosis Date Noted  . Acute bronchitis with COPD ([place]) 11/30/2016  . Aortic calcification ([place]) 11/30/2016  . History of patellar fracture 11/30/2016  . Routine general medical examination at a health care facility 06/27/2016  .  Abdominal bloating 06/15/2016  . Cerumen impaction 06/15/2016  . Dyspepsia 06/04/2016  . Loss of weight 06/04/2016  . Cough 04/26/2016  . History of vertebral compression fracture 01/19/2016  . Grief reaction 08/08/2015  . Dysphagia 06/28/2014  . Sebaceous cyst 01/08/2013  . Chronic foot pain 10/27/2011  . Low back pain 04/14/2011  . FEVER BLISTER 11/17/2010  . Essential hypertension 08/18/2009  . BUNION 03/04/2009  . Allergic rhinitis 01/10/2008  . TOBACCO USE, QUIT 02/10/2007  . GOITER, MULTINODULAR 02/09/2007  . Hypothyroidism 02/09/2007  . HYPERCHOLESTEROLEMIA 02/09/2007  . DEPRESSION 02/09/2007  . OSTEOARTHRITIS 02/09/2007  . Osteoporosis 02/09/2007  . Disturbance in sleep behavior 02/09/2007   Past Medical History:  Diagnosis Date  . Depression   . Hypothyroidism   . Osteoarthritis   . Osteoporosis   . Shingles    arm 1/12  . Sleep disorder   . Tobacco abuse    past; quit 09   Past Surgical History:  Procedure Laterality Date  . BIOPSY THYROID     pos neoplasm, surgery 09/2006  . bunions  06/1998  . CXR-copd, ts partial compression fracture  12/2006  . dexa  12/1996  . hashimoto  02/1997  . hyerectomy- cervical ca cells    . left wrist fracture     surgery  . right thyroid lobectomy, goiter, thyroiditis  12/2006  . stress fractures foot    . THYROIDECTOMY  11/2009   Social  History  Substance Use Topics  . Smoking status: Former Research scientist (life sciences)  . Smokeless tobacco: Never Used     Comment: Quit 10/09  . Alcohol use No   Family History  Problem Relation Age of Onset  . Stroke Father   . Hypertension Father   . Diabetes Mother   . Coronary artery disease Mother    Allergies  Allergen Reactions  . Fosamax [Alendronate Sodium]     Dysphagia and heartburn    Current Outpatient Prescriptions on File Prior to Visit  Medication Sig Dispense Refill  . albuterol (PROVENTIL HFA;VENTOLIN HFA) 108 (90 BASE) MCG/ACT inhaler Inhale 2 puffs into the lungs every 4 (four)  hours as needed for wheezing. 1 Inhaler 11  . atorvastatin (LIPITOR) 40 MG tablet TAKE 1 TABLET EVERY DAY 90 tablet 3  . calcitonin, salmon, (MIACALCIN) 200 UNIT/ACT nasal spray Place 1 spray into alternate nostrils daily. 3.7 mL 3  . Calcium Carbonate-Vit D-Min (CALCIUM 1200 PO) Take by mouth daily.      Marland Kitchen desipramine (NOPRAMIN) 100 MG tablet Take 100 mg by mouth at bedtime.    Marland Kitchen levothyroxine (SYNTHROID, LEVOTHROID) 100 MCG tablet Take 1 tablet (100 mcg total) by mouth daily before breakfast. 90 tablet 1  . lisinopril (PRINIVIL,ZESTRIL) 10 MG tablet TAKE 1/2 TABLET EVERY DAY 45 tablet 1  . loratadine (CLARITIN) 10 MG tablet Take 1 tablet (10 mg total) by mouth daily.    . mupirocin ointment (BACTROBAN) 2 % Apply to affected area twice daily 15 g 1  . nortriptyline (PAMELOR) 25 MG capsule TAKE 1 CAPSULE AT BEDTIME WITH A 75 MG CAPSULE 90 capsule 3  . nortriptyline (PAMELOR) 75 MG capsule TAKE 1 CAPSULE AT BEDTIME 90 capsule 3  . raloxifene (EVISTA) 60 MG tablet Take 1 tablet (60 mg total) by mouth daily. 90 tablet 3  . temazepam (RESTORIL) 30 MG capsule Take 1 capsule (30 mg total) by mouth at bedtime. 90 capsule 1   No current facility-administered medications on file prior to visit.     Review of Systems Review of Systems  Constitutional: Negative for fever, appetite change, fatigue and unexpected weight change.  Eyes: Negative for pain and visual disturbance.  ENT neg for ear or throat pain  Respiratory: Negative for sob/ pos for pm wheeze and cough- improving  Cardiovascular: Negative for cp or palpitations    Gastrointestinal: Negative for nausea, diarrhea and constipation.  Genitourinary: Negative for urgency and frequency.  Skin: Negative for pallor or rash   MSK pos for L knee pain after patellar fracture  Neurological: Negative for weakness, light-headedness, numbness and headaches.  Hematological: Negative for adenopathy. Does not bruise/bleed easily.  Psychiatric/Behavioral:  Negative for dysphoric mood. The patient is not nervous/anxious.         Objective:   Physical Exam  Constitutional: She appears well-developed and well-nourished. No distress.  Frail appearing elderly female  HENT:  Head: Normocephalic and atraumatic.  Mouth/Throat: Oropharynx is clear and moist.  Eyes: Conjunctivae and EOM are normal. Pupils are equal, round, and reactive to light.  Neck: Normal range of motion. Neck supple. No JVD present. Carotid bruit is not present. No thyromegaly present.  Cardiovascular: Normal rate, regular rhythm, normal heart sounds and intact distal pulses.  Exam reveals no gallop.   Pulmonary/Chest: Effort normal. No respiratory distress. She has wheezes. She has no rales. She exhibits no tenderness.  No crackles Very distant bs  Mildly prolonged exp phase -baseline  No rales or rhonchi Wheeze only on  forced expiration   Abdominal: Soft. Bowel sounds are normal. She exhibits no distension, no abdominal bruit and no mass. There is no tenderness.  Musculoskeletal: She exhibits no edema.  L anterior knee tenderness  Lymphadenopathy:    She has no cervical adenopathy.  Neurological: She is alert. She has normal reflexes. No cranial nerve deficit. She exhibits normal muscle tone. Coordination normal.  Skin: Skin is warm and dry. No rash noted. No pallor.  Psychiatric: She has a normal mood and affect.          Assessment & Plan:   Problem List Items Addressed This Visit      Cardiovascular and Mediastinum   Aortic calcification ([place])    She has risk factors for atherosclerosis-will watch  bp controlled  On statin for cholesterol Former smoker       Essential hypertension    bp in fair control at this time  BP Readings from Last 1 Encounters:  11/30/16 116/78   No changes needed Disc lifstyle change with low sodium diet and exercise          Respiratory   Acute bronchitis with COPD [facility])    Improving  Reviewed hospital records, lab  results and studies in detail   Re assuring exam  Finished prednisone and azithro Expect 1-2 more wk of cough  Px tessalon  Update if not starting to improve in a week or if worsening   .      Relevant Medications   benzonatate (TESSALON) 200 MG capsule     Other   History of patellar fracture    Records are in care everywhere from [facility] clinic  Was in brace Improved Has OP and fall risk  Long disc re fall prevention today

## 2016-11-30 NOTE — Assessment & Plan Note (Signed)
Records are in care everywhere from [facility] clinic  Was in brace Improved Has OP and fall risk  Long disc re fall prevention today

## 2016-11-30 NOTE — Patient Instructions (Addendum)
Keep drinking lots of fluids and rest  Take the tessalon for cough  Robitussin is fine if you want to try it again  Inhaler as needed  If symptoms worsen or you get short of breath or a fever let us know   Update if not starting to improve in a week or if worsening    Use a cane or walker if you feel unsteady-I am worried about your fall risk

## 2016-11-30 NOTE — Assessment & Plan Note (Signed)
bp in fair control at this time  BP Readings from Last 1 Encounters:  11/30/16 116/78   No changes needed Disc lifstyle change with low sodium diet and exercise

## 2016-11-30 NOTE — Assessment & Plan Note (Signed)
She has risk factors for atherosclerosis-will watch  bp controlled  On statin for cholesterol Former smoker

## 2016-11-30 NOTE — Assessment & Plan Note (Addendum)
Improving  Reviewed hospital records, lab results and studies in detail   Re assuring exam  Finished prednisone and azithro Expect 1-2 more wk of cough  Px tessalon  Update if not starting to improve in a week or if worsening   .

## 2016-11-30 NOTE — Progress Notes (Signed)
Pre visit review using our clinic review tool, if applicable. No additional management support is needed unless otherwise documented below in the visit note. 

## 2016-12-08 DIAGNOSIS — S82002A Unspecified fracture of left patella, initial encounter for closed fracture: Secondary | ICD-10-CM | POA: Diagnosis not present

## 2016-12-08 DIAGNOSIS — M25562 Pain in left knee: Secondary | ICD-10-CM | POA: Diagnosis not present

## 2017-02-07 DIAGNOSIS — M2242 Chondromalacia patellae, left knee: Secondary | ICD-10-CM | POA: Diagnosis not present

## 2017-02-07 DIAGNOSIS — M1712 Unilateral primary osteoarthritis, left knee: Secondary | ICD-10-CM | POA: Diagnosis not present

## 2017-02-07 DIAGNOSIS — S82002D Unspecified fracture of left patella, subsequent encounter for closed fracture with routine healing: Secondary | ICD-10-CM | POA: Diagnosis not present

## 2017-02-14 ENCOUNTER — Other Ambulatory Visit: Payer: Self-pay | Admitting: Family Medicine

## 2017-05-05 ENCOUNTER — Other Ambulatory Visit: Payer: Self-pay | Admitting: Family Medicine

## 2017-05-05 NOTE — Telephone Encounter (Signed)
Last OV was a hospital f/u was on 11/30/16, no future appts., last filled on 11/05/16 #90 caps with 1 additional refill, if approved I would need printed Rx to fax to [facility] (mail order)

## 2017-05-06 NOTE — Telephone Encounter (Signed)
Rx faxed to [facility] 

## 2017-05-06 NOTE — Telephone Encounter (Signed)
Printed to fax  

## 2017-07-11 ENCOUNTER — Other Ambulatory Visit: Payer: Self-pay | Admitting: Family Medicine

## 2017-08-04 DIAGNOSIS — M545 Low back pain: Secondary | ICD-10-CM | POA: Diagnosis not present

## 2017-08-04 DIAGNOSIS — M47816 Spondylosis without myelopathy or radiculopathy, lumbar region: Secondary | ICD-10-CM | POA: Diagnosis not present

## 2017-08-08 DIAGNOSIS — H524 Presbyopia: Secondary | ICD-10-CM | POA: Diagnosis not present

## 2017-08-31 ENCOUNTER — Encounter: Payer: Self-pay | Admitting: Family Medicine

## 2017-08-31 ENCOUNTER — Ambulatory Visit: Payer: Medicare HMO | Admitting: Family Medicine

## 2017-08-31 ENCOUNTER — Ambulatory Visit (INDEPENDENT_AMBULATORY_CARE_PROVIDER_SITE_OTHER)
Admission: RE | Admit: 2017-08-31 | Discharge: 2017-08-31 | Disposition: A | Discharge status: Home / Self Care | Payer: Medicare HMO | Source: Ambulatory Visit | Attending: Family Medicine | Admitting: Family Medicine

## 2017-08-31 VITALS — BP 124/78 | HR 73 | Temp 98.2°F | Wt 111.5 lb

## 2017-08-31 DIAGNOSIS — M8000XS Age-related osteoporosis with current pathological fracture, unspecified site, sequela: Secondary | ICD-10-CM | POA: Diagnosis not present

## 2017-08-31 DIAGNOSIS — M545 Low back pain, unspecified: Secondary | ICD-10-CM

## 2017-08-31 DIAGNOSIS — Z23 Encounter for immunization: Secondary | ICD-10-CM

## 2017-08-31 DIAGNOSIS — G8929 Other chronic pain: Secondary | ICD-10-CM | POA: Diagnosis not present

## 2017-08-31 NOTE — Assessment & Plan Note (Signed)
Worsening lower back pain with localization to the right in prior h/o compression fracture. Check lumbar films to eval for recurrent compression fracture.

## 2017-08-31 NOTE — Progress Notes (Signed)
BP 124/78 (BP Location: Left Arm, Patient Position: Sitting, Cuff Size: Normal)   Pulse 73   Temp 98.2 F (36.8 C) (Oral)   Wt 111 lb 8 oz (50.6 kg)   SpO2 97%   BMI 19.44 kg/m    CC: back and leg pain Subjective:    Patient ID: [name] [name], female    DOB: 27-Oct-1945, 71 y.o.   MRN: [id number]  HPI: [name] [name] is a 71 y.o. female presenting on 08/31/2017 for Back Pain (Constant, dull achy pain in low right side. Started a few months ago. Tried Tylenol Extra Strength, not helpful) and Leg Pain (Sharp pain off and on in anterior upper righ LE into the knee. Started about 1 mo ago. )   Several month h/o R lower back pain described as throbbing ache. Denies inciting trauma/injury or falls. 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 More pain with transitions from sitting to standing. She also endorses intermittent R anterior thigh pain that is improving. Denies numbness or weakness of leg, bowel/bladder incontinence, fevers/chills. She has lost some weight over the last 6 months - not trying. She has been treating pain with tylenol and ibuprofen without significant improvement. Back brace helps.   H/o T9 vertebral body compression fracture 01/2016. Treated with miacalcin.  Osteoporosis - takes calcium and vit D daily. She also continues salmon calcitonin since 01/2016 as well as the evista for the last 1 year.   Requests jury duty release letter - advised this needs to come through PCP  Relevant past medical, surgical, family and social history reviewed and updated as indicated. Interim medical history since our last visit reviewed. Allergies and medications reviewed and updated. Outpatient Medications Prior to Visit  Medication Sig Dispense Refill  . albuterol (PROVENTIL HFA;VENTOLIN HFA) 108 (90 BASE) MCG/ACT inhaler Inhale 2 puffs into the lungs every 4 (four) hours as needed for wheezing. 1 Inhaler 11  . atorvastatin (LIPITOR) 40 MG tablet TAKE 1 TABLET EVERY DAY 90  tablet 0  . benzonatate (TESSALON) 200 MG capsule Take 1 capsule (200 mg total) by mouth 3 (three) times daily as needed. Swallow whole, do not bite pill 30 capsule 1  . Calcium Carbonate-Vit D-Min (CALCIUM 1200 PO) Take by mouth daily.      Marland Kitchen desipramine (NOPRAMIN) 100 MG tablet Take 100 mg by mouth at bedtime.    Marland Kitchen levothyroxine (SYNTHROID, LEVOTHROID) 100 MCG tablet TAKE 1 TABLET EVERY DAY BEFORE BREAKFAST 90 tablet 1  . lisinopril (PRINIVIL,ZESTRIL) 10 MG tablet TAKE 1/2 TABLET EVERY DAY 45 tablet 1  . loratadine (CLARITIN) 10 MG tablet Take 1 tablet (10 mg total) by mouth daily.    . mupirocin ointment (BACTROBAN) 2 % Apply to affected area twice daily 15 g 1  . nortriptyline (PAMELOR) 25 MG capsule TAKE 1 CAPSULE AT BEDTIME WITH A 75 MG CAPSULE 90 capsule 0  . nortriptyline (PAMELOR) 75 MG capsule TAKE 1 CAPSULE AT BEDTIME 90 capsule 0  . raloxifene (EVISTA) 60 MG tablet Take 1 tablet (60 mg total) by mouth daily. 90 tablet 3  . temazepam (RESTORIL) 30 MG capsule TAKE 1 CAPSULE AT BEDTIME 90 capsule 1  . calcitonin, salmon, (MIACALCIN) 200 UNIT/ACT nasal spray Place 1 spray into alternate nostrils daily. 3.7 mL 3   No facility-administered medications prior to visit.      Per HPI unless specifically indicated in ROS section below Review of Systems     Objective:    BP  124/78 (BP Location: Left Arm, Patient Position: Sitting, Cuff Size: Normal)   Pulse 73   Temp 98.2 F (36.8 C) (Oral)   Wt 111 lb 8 oz (50.6 kg)   SpO2 97%   BMI 19.44 kg/m   Wt Readings from Last 3 Encounters:  08/31/17 111 lb 8 oz (50.6 kg)  11/30/16 116 lb 8 oz (52.8 kg)  11/21/16 120 lb (54.4 kg)    Physical Exam  Constitutional: She is oriented to person, place, and time. She appears well-developed and well-nourished. No distress.  Musculoskeletal: Normal range of motion. She exhibits no edema.  Cervical kyphosis present Mild discomfort lower thoracic and lower lumbar midline spine  No significant  paraspinous mm tenderness Tender R>L SLR - pain localizes to anterior thigh on right No pain with int/ext rotation at hip. Neg FABER. No pain at GTB or sciatic notch bilaterally.  Tender at R SIJ  Neurological: She is alert and oriented to person, place, and time. She has normal strength. No sensory deficit.  5/5 strength BLE Decreased patellar DTRs bilaterally  Nursing note and vitals reviewed.  Lab Results  Component Value Date   CREATININE 0.75 10/17/2016       Assessment & Plan:   Problem List Items Addressed This Visit    Low back pain - Primary    Worsening lower back pain with localization to the right in prior h/o compression fracture. Check lumbar films to eval for recurrent compression fracture.      Relevant Orders   DG Lumbar Spine Complete (Completed)   Osteoporosis    Currently on evista, continues calcium and vitamin D daily. She also has been taking calcitonin since 12/2015. I asked her to stop this and just continue evista.  Intolerance to oral bisphosphonate. Consider IV bisphosphonate vs prolia.  Overdue for DEXA.      Relevant Orders   DG Lumbar Spine Complete (Completed)    Other Visit Diagnoses    Need for influenza vaccination       Relevant Orders   Flu Vaccine QUAD 6+ mos PF IM (Fluarix Quad PF) (Completed)       Follow up plan: Return if symptoms worsen or fail to improve.  Ria Bush, MD

## 2017-08-31 NOTE — Patient Instructions (Addendum)
Flu shot today Stop salmon calcitonin, continue evista and calcium/vitamin D.  Schedule physical with Dr Glori Bickers as soon as able. We will check lumbar xrays to rule out compression fracture.  Then we will be in touch with plan.

## 2017-09-01 ENCOUNTER — Telehealth: Payer: Self-pay | Admitting: Family Medicine

## 2017-09-01 ENCOUNTER — Other Ambulatory Visit: Payer: Self-pay | Admitting: Family Medicine

## 2017-09-01 NOTE — Telephone Encounter (Signed)
[place] with [facility] called in a lumbar spine x-ray result. It shows an  L4 Compression fracture of uncertain age.  It could be better characterized with a lumbar spine MRI, if needed. The is an old compression fracture at T-9.

## 2017-09-01 NOTE — Assessment & Plan Note (Addendum)
Currently on evista, continues calcium and vitamin D daily. She also has been taking calcitonin since 12/2015. I asked her to stop this and just continue evista.  Intolerance to oral bisphosphonate. Consider IV bisphosphonate vs prolia.  Overdue for DEXA.

## 2017-09-12 ENCOUNTER — Other Ambulatory Visit: Payer: Self-pay | Admitting: Family Medicine

## 2017-09-14 ENCOUNTER — Other Ambulatory Visit: Payer: Self-pay | Admitting: Family Medicine

## 2017-09-18 ENCOUNTER — Telehealth: Payer: Self-pay | Admitting: Family Medicine

## 2017-09-18 DIAGNOSIS — E7800 Pure hypercholesterolemia, unspecified: Secondary | ICD-10-CM

## 2017-09-18 DIAGNOSIS — E890 Postprocedural hypothyroidism: Secondary | ICD-10-CM

## 2017-09-18 DIAGNOSIS — I10 Essential (primary) hypertension: Secondary | ICD-10-CM

## 2017-09-18 NOTE — Telephone Encounter (Signed)
-----   Message from Eustace Pen, LPN sent at [phone]/68/0881  4:22 PM EST ----- Regarding: Labs 1/16 Lab orders needed. Thank you.  Insurance:  [employer]

## 2017-09-22 ENCOUNTER — Ambulatory Visit (INDEPENDENT_AMBULATORY_CARE_PROVIDER_SITE_OTHER): Payer: Medicare HMO

## 2017-09-22 VITALS — BP 110/70 | HR 70 | Temp 97.5°F | Ht 63.0 in | Wt 112.5 lb

## 2017-09-22 DIAGNOSIS — E7800 Pure hypercholesterolemia, unspecified: Secondary | ICD-10-CM

## 2017-09-22 DIAGNOSIS — Z0000 Encounter for general adult medical examination without abnormal findings: Secondary | ICD-10-CM | POA: Diagnosis not present

## 2017-09-22 DIAGNOSIS — I10 Essential (primary) hypertension: Secondary | ICD-10-CM | POA: Diagnosis not present

## 2017-09-22 DIAGNOSIS — E890 Postprocedural hypothyroidism: Secondary | ICD-10-CM

## 2017-09-22 LAB — CBC WITH DIFFERENTIAL/PLATELET
Basophils Absolute: 0 10*3/uL (ref 0.0–0.1)
Basophils Relative: 0.4 % (ref 0.0–3.0)
Eosinophils Absolute: 0.1 10*3/uL (ref 0.0–0.7)
Eosinophils Relative: 1.9 % (ref 0.0–5.0)
HCT: 43.1 % (ref 36.0–46.0)
Hemoglobin: 14.5 g/dL (ref 12.0–15.0)
Lymphocytes Relative: 41.2 % (ref 12.0–46.0)
Lymphs Abs: 2.3 10*3/uL (ref 0.7–4.0)
MCHC: 33.6 g/dL (ref 30.0–36.0)
MCV: 95.2 fl (ref 78.0–100.0)
Monocytes Absolute: 0.5 10*3/uL (ref 0.1–1.0)
Monocytes Relative: 8.2 % (ref 3.0–12.0)
Neutro Abs: 2.7 10*3/uL (ref 1.4–7.7)
Neutrophils Relative %: 48.3 % (ref 43.0–77.0)
Platelets: 267 10*3/uL (ref 150.0–400.0)
RBC: 4.53 Mil/uL (ref 3.87–5.11)
RDW: 14.3 % (ref 11.5–15.5)
WBC: 5.5 10*3/uL (ref 4.0–10.5)

## 2017-09-22 LAB — LIPID PANEL
Cholesterol: 240 mg/dL — ABNORMAL HIGH (ref 0–200)
HDL: 78 mg/dL (ref >39.00)
LDL Cholesterol: 148 mg/dL — ABNORMAL HIGH (ref 0–99)
NonHDL: 162.11
Total CHOL/HDL Ratio: 3
Triglycerides: 73 mg/dL (ref 0.0–149.0)
VLDL: 14.6 mg/dL (ref 0.0–40.0)

## 2017-09-22 LAB — COMPREHENSIVE METABOLIC PANEL
ALT: 10 U/L (ref 0–35)
AST: 20 U/L (ref 0–37)
Albumin: 4.1 g/dL (ref 3.5–5.2)
Alkaline Phosphatase: 65 U/L (ref 39–117)
BUN: 11 mg/dL (ref 6–23)
CO2: 31 mEq/L (ref 19–32)
Calcium: 9.1 mg/dL (ref 8.4–10.5)
Chloride: 104 mEq/L (ref 96–112)
Creatinine, Ser: 0.72 mg/dL (ref 0.40–1.20)
GFR: 84.62 mL/min (ref >60.00)
Glucose, Bld: 87 mg/dL (ref 70–99)
Potassium: 4.5 mEq/L (ref 3.5–5.1)
Sodium: 140 mEq/L (ref 135–145)
Total Bilirubin: 0.3 mg/dL (ref 0.2–1.2)
Total Protein: 6.7 g/dL (ref 6.0–8.3)

## 2017-09-22 LAB — TSH: TSH: 2.12 u[IU]/mL (ref 0.35–4.50)

## 2017-09-22 NOTE — Progress Notes (Signed)
PCP notes:   Health maintenance:  No gaps identified.  Abnormal screenings:   Fall risk - hx of fall with injury and medical treatment Mini-Cog score: 19/20  Patient concerns:   Chronic bilateral leg pain and acute neck pain. Pain scale: 8/10.  Nurse concerns:  None  Next PCP appt:   09/26/17 @ 1045

## 2017-09-22 NOTE — Progress Notes (Signed)
Subjective:   [name] [name] is a 72 y.o. female who presents for Medicare Annual (Subsequent) preventive examination.  Review of Systems:  N/A Cardiac Risk Factors include: advanced age (>17men, >15 women);dyslipidemia;hypertension     Objective:     Vitals: BP 110/70 (BP Location: Right Arm, Patient Position: Sitting, Cuff Size: Normal)   Pulse 70   Temp (!) 97.5 F (36.4 C) (Oral)   Ht 5\' 3"  (1.6 m) Comment: no shoes  Wt 112 lb 8 oz (51 kg)   SpO2 97%   BMI 19.93 kg/m   Body mass index is 19.93 kg/m.  Advanced Directives 09/22/2017 11/21/2016 10/17/2016 07/02/2016 05/08/2015  Does Patient Have a Medical Advance Directive? No No No No No  Would patient like information on creating a medical advance directive? Yes (MAU/Ambulatory/Procedural Areas - Information given) Yes (ED - Information included in AVS) - Yes - Educational materials given -    Tobacco Social History   Tobacco Use  Smoking Status Former Smoker  Smokeless Tobacco Never Used  Tobacco Comment   Quit 10/09     Counseling given: No Comment: Quit 10/09   Clinical Intake:  Pre-visit preparation completed: Yes  Pain Score: 8      Nutritional Status: BMI of 19-24  Normal Nutritional Risks: None Diabetes: No  How often do you need to have someone help you when you read instructions, pamphlets, or other written materials from your doctor or pharmacy?: 1 - Never What is the last grade level you completed in school?: 10th grade  Interpreter Needed?: No  Comments: pt is a widow and lives alone Information entered by :: LPinson, LPN  Past Medical History:  Diagnosis Date  . Depression   . Hypothyroidism   . Osteoarthritis   . Osteoporosis   . Shingles    arm 1/12  . Sleep disorder   . Tobacco abuse    past; quit 09   Past Surgical History:  Procedure Laterality Date  . BIOPSY THYROID     pos neoplasm, surgery 09/2006  . bunions  06/1998  . CXR-copd, ts partial compression fracture  12/2006   . dexa  12/1996  . hashimoto  02/1997  . hyerectomy- cervical ca cells    . left wrist fracture     surgery  . right thyroid lobectomy, goiter, thyroiditis  12/2006  . stress fractures foot    . THYROIDECTOMY  11/2009   Family History  Problem Relation Age of Onset  . Stroke Father   . Hypertension Father   . Diabetes Mother   . Coronary artery disease Mother    Social History   Socioeconomic History  . Marital status: Married    Spouse name: None  . Number of children: None  . Years of education: None  . Highest education level: None  Social Needs  . Financial resource strain: None  . Food insecurity - worry: None  . Food insecurity - inability: None  . Transportation needs - medical: None  . Transportation needs - non-medical: None  Occupational History  . None  Tobacco Use  . Smoking status: Former Research scientist (life sciences)  . Smokeless tobacco: Never Used  . Tobacco comment: Quit 10/09  Substance and Sexual Activity  . Alcohol use: No    Alcohol/week: 0.0 oz  . Drug use: No  . Sexual activity: None  Other Topics Concern  . None  Social History Narrative   Marital Status: widowed.    3 children   Lost husband to throat  cancer in 8/16 .     Outpatient Encounter Medications as of 09/22/2017  Medication Sig  . albuterol (PROVENTIL HFA;VENTOLIN HFA) 108 (90 BASE) MCG/ACT inhaler Inhale 2 puffs into the lungs every 4 (four) hours as needed for wheezing.  Marland Kitchen atorvastatin (LIPITOR) 40 MG tablet TAKE 1 TABLET EVERY DAY  . benzonatate (TESSALON) 200 MG capsule Take 1 capsule (200 mg total) by mouth 3 (three) times daily as needed. Swallow whole, do not bite pill  . Calcium Carbonate-Vit D-Min (CALCIUM 1200 PO) Take by mouth daily.    Marland Kitchen desipramine (NOPRAMIN) 100 MG tablet Take 100 mg by mouth at bedtime.  Marland Kitchen levothyroxine (SYNTHROID, LEVOTHROID) 100 MCG tablet TAKE 1 TABLET EVERY DAY BEFORE BREAKFAST  . lisinopril (PRINIVIL,ZESTRIL) 10 MG tablet TAKE 1/2 TABLET EVERY DAY  . loratadine  (CLARITIN) 10 MG tablet Take 1 tablet (10 mg total) by mouth daily.  . mupirocin ointment (BACTROBAN) 2 % Apply to affected area twice daily  . nortriptyline (PAMELOR) 25 MG capsule TAKE 1 CAPSULE AT BEDTIME WITH A 75MG  CAPSULE  . nortriptyline (PAMELOR) 75 MG capsule TAKE 1 CAPSULE AT BEDTIME  . raloxifene (EVISTA) 60 MG tablet Take 1 tablet (60 mg total) by mouth daily.  . temazepam (RESTORIL) 30 MG capsule TAKE 1 CAPSULE AT BEDTIME   No facility-administered encounter medications on file as of 09/22/2017.     Activities of Daily Living In your present state of health, do you have any difficulty performing the following activities: 09/22/2017  Hearing? N  Vision? N  Difficulty concentrating or making decisions? Y  Walking or climbing stairs? N  Dressing or bathing? N  Doing errands, shopping? N  Preparing Food and eating ? N  Using the Toilet? N  In the past six months, have you accidently leaked urine? N  Do you have problems with loss of bowel control? N  Managing your Medications? N  Managing your Finances? N  Housekeeping or managing your Housekeeping? N  Some recent data might be hidden    Patient Care Team: Tower, Wynelle Fanny, MD as PCP - General Kerin Ransom Stephannie Li, OD as Consulting Physician (Optometry)    Assessment:   This is a routine wellness examination for [name] [name].   Hearing Screening   125Hz  250Hz  500Hz  1000Hz  2000Hz  3000Hz  4000Hz  6000Hz  8000Hz   Right ear:   40 40 40  40    Left ear:   40 40 40  40    Vision Screening Comments: Last vision exam in Dec 2018 with Dr. Kerin Ransom    Exercise Activities and Dietary recommendations Current Exercise Habits: Home exercise routine, Type of exercise: stretching, Frequency (Times/Week): 5, Intensity: Mild, Exercise limited by: None identified  Goals    . Increase physical activity     Starting 09/22/2017, I will continue to do stretching and core exercises for 5 minutes daily and to resume walking when weather permits.         Fall Risk Fall Risk  09/22/2017 07/02/2016 01/02/2014  Falls in the past year? Yes No No  Comment fall with fracture of left knee - -  Number falls in past yr: 1 - -  Injury with Fall? Yes - -   Depression Screen PHQ 2/9 Scores 09/22/2017 07/02/2016 01/02/2014  PHQ - 2 Score 0 0 0  PHQ- 9 Score 0 - -     Cognitive Function MMSE - Mini Mental State Exam 09/22/2017 07/02/2016  Orientation to time 5 5  Orientation to Place 5 5  Registration  3 3  Attention/ Calculation 0 0  Recall 3 3  Language- name 2 objects 0 0  Language- repeat 1 1  Language- follow 3 step command 2 0  Language- follow 3 step command-comments unable to follow 1 step of 3 step command pt was unable to follow 3 steps of 3 step command  Language- read & follow direction 0 0  Write a sentence 0 0  Copy design 0 0  Total score 19 17     PLEASE NOTE: A Mini-Cog screen was completed. Maximum score is 20. A value of 0 denotes this part of Folstein MMSE was not completed or the patient failed this part of the Mini-Cog screening.   Mini-Cog Screening Orientation to Time - Max 5 pts Orientation to Place - Max 5 pts Registration - Max 3 pts Recall - Max 3 pts Language Repeat - Max 1 pts Language Follow 3 Step Command - Max 3 pts     Immunization History  Administered Date(s) Administered  . Influenza Split 05/24/2012  . Influenza Whole 06/16/2007  . Influenza,inj,Quad PF,6+ Mos 07/04/2013, 06/28/2014, 07/25/2015, 06/04/2016, 08/31/2017  . Pneumococcal Conjugate-13 08/08/2015  . Pneumococcal Polysaccharide-23 11/10/2007, 06/28/2014  . Td 11/10/2007    Screening Tests Health Maintenance  Topic Date Due  . MAMMOGRAM  05/24/2021 (Originally 09/12/1995)  . DEXA SCAN  05/24/2021 (Originally 09/11/2010)  . COLONOSCOPY  05/24/2021 (Originally 09/12/1995)  . TETANUS/TDAP  11/09/2017  . INFLUENZA VACCINE  Completed  . Hepatitis C Screening  Completed  . PNA vac Low Risk Adult  Completed       Plan:     I have  personally reviewed, addressed, and noted the following in the patient's chart:  A. Medical and social history B. Use of alcohol, tobacco or illicit drugs  C. Current medications and supplements D. Functional ability and status E.  Nutritional status F.  Physical activity G. Advance directives H. List of other physicians I.  Hospitalizations, surgeries, and ER visits in previous 12 months J.  [place] to include hearing, vision, cognitive, depression L. Referrals and appointments - none  In addition, I have reviewed and discussed with patient certain preventive protocols, quality metrics, and best practice recommendations. A written personalized care plan for preventive services as well as general preventive health recommendations were provided to patient.  See attached scanned questionnaire for additional information.   Signed,   Lindell Noe, MHA, BS, LPN Health Coach

## 2017-09-22 NOTE — Patient Instructions (Addendum)
Ms. Gest , Thank you for taking time to come for your Medicare Wellness Visit. I appreciate your ongoing commitment to your health goals. Please review the following plan we discussed and let me know if I can assist you in the future.   These are the goals we discussed: Goals    . Increase physical activity     Starting 09/22/2017, I will continue to do stretching and core exercises for 5 minutes daily and to resume walking when weather permits.        This is a list of the screening recommended for you and due dates:  Health Maintenance  Topic Date Due  . Mammogram  05/24/2021*  . DEXA scan (bone density measurement)  05/24/2021*  . Colon Cancer Screening  05/24/2021*  . Tetanus Vaccine  11/09/2017  . Flu Shot  Completed  .  Hepatitis C: One time screening is recommended by Center for Disease Control  (CDC) for  adults born from [birth year] through 1965.   Completed  . Pneumonia vaccines  Completed  *Topic was postponed. The date shown is not the original due date.   Preventive Care for Adults  A healthy lifestyle and preventive care can promote health and wellness. Preventive health guidelines for adults include the following key practices.  . A routine yearly physical is a good way to check with your health care provider about your health and preventive screening. It is a chance to share any concerns and updates on your health and to receive a thorough exam.  . Visit your dentist for a routine exam and preventive care every 6 months. Brush your teeth twice a day and floss once a day. Good oral hygiene prevents tooth decay and gum disease.  . The frequency of eye exams is based on your age, health, family medical history, use  of contact lenses, and other factors. Follow your health care provider's recommendations for frequency of eye exams.  . Eat a healthy diet. Foods like vegetables, fruits, whole grains, low-fat dairy products, and lean protein foods contain the nutrients you need  without too many calories. Decrease your intake of foods high in solid fats, added sugars, and salt. Eat the right amount of calories for you. Get information about a proper diet from your health care provider, if necessary.  . Regular physical exercise is one of the most important things you can do for your health. Most adults should get at least 150 minutes of moderate-intensity exercise (any activity that increases your heart rate and causes you to sweat) each week. In addition, most adults need muscle-strengthening exercises on 2 or more days a week.  Silver Sneakers may be a benefit available to you. To determine eligibility, you may visit the website: www.silversneakers.com or contact program at (351)865-6166 Mon-Fri between 8AM-8PM.   . Maintain a healthy weight. The body mass index (BMI) is a screening tool to identify possible weight problems. It provides an estimate of body fat based on height and weight. Your health care provider can find your BMI and can help you achieve or maintain a healthy weight.   For adults 20 years and older: ? A BMI below 18.5 is considered underweight. ? A BMI of 18.5 to 24.9 is normal. ? A BMI of 25 to 29.9 is considered overweight. ? A BMI of 30 and above is considered obese.   . Maintain normal blood lipids and cholesterol levels by exercising and minimizing your intake of saturated fat. Eat a balanced diet with plenty  of fruit and vegetables. Blood tests for lipids and cholesterol should begin at age 76 and be repeated every 5 years. If your lipid or cholesterol levels are high, you are over 50, or you are at high risk for heart disease, you may need your cholesterol levels checked more frequently. Ongoing high lipid and cholesterol levels should be treated with medicines if diet and exercise are not working.  . If you smoke, find out from your health care provider how to quit. If you do not use tobacco, please do not start.  . If you choose to drink  alcohol, please do not consume more than 2 drinks per day. One drink is considered to be 12 ounces (355 mL) of beer, 5 ounces (148 mL) of wine, or 1.5 ounces (44 mL) of liquor.  . If you are 23-33 years old, ask your health care provider if you should take aspirin to prevent strokes.  . Use sunscreen. Apply sunscreen liberally and repeatedly throughout the day. You should seek shade when your shadow is shorter than you. Protect yourself by wearing long sleeves, pants, a wide-brimmed hat, and sunglasses year round, whenever you are outdoors.  . Once a month, do a whole body skin exam, using a mirror to look at the skin on your back. Tell your health care provider of new moles, moles that have irregular borders, moles that are larger than a pencil eraser, or moles that have changed in shape or color.

## 2017-09-22 NOTE — Progress Notes (Signed)
I reviewed health advisor's note, was available for consultation, and agree with documentation and plan.  

## 2017-09-22 NOTE — Progress Notes (Signed)
Pre visit review using our clinic review tool, if applicable. No additional management support is needed unless otherwise documented below in the visit note. 

## 2017-09-26 ENCOUNTER — Ambulatory Visit (INDEPENDENT_AMBULATORY_CARE_PROVIDER_SITE_OTHER): Payer: Medicare HMO | Admitting: Family Medicine

## 2017-09-26 ENCOUNTER — Encounter: Payer: Self-pay | Admitting: Family Medicine

## 2017-09-26 VITALS — BP 136/78 | HR 69 | Temp 97.6°F | Ht 63.0 in | Wt 110.5 lb

## 2017-09-26 DIAGNOSIS — M79605 Pain in left leg: Secondary | ICD-10-CM

## 2017-09-26 DIAGNOSIS — M79604 Pain in right leg: Secondary | ICD-10-CM

## 2017-09-26 DIAGNOSIS — G479 Sleep disorder, unspecified: Secondary | ICD-10-CM

## 2017-09-26 DIAGNOSIS — Z0000 Encounter for general adult medical examination without abnormal findings: Secondary | ICD-10-CM | POA: Diagnosis not present

## 2017-09-26 DIAGNOSIS — E7800 Pure hypercholesterolemia, unspecified: Secondary | ICD-10-CM | POA: Diagnosis not present

## 2017-09-26 DIAGNOSIS — M8000XS Age-related osteoporosis with current pathological fracture, unspecified site, sequela: Secondary | ICD-10-CM | POA: Diagnosis not present

## 2017-09-26 DIAGNOSIS — E890 Postprocedural hypothyroidism: Secondary | ICD-10-CM | POA: Diagnosis not present

## 2017-09-26 DIAGNOSIS — Z8781 Personal history of (healed) traumatic fracture: Secondary | ICD-10-CM | POA: Diagnosis not present

## 2017-09-26 DIAGNOSIS — Z8639 Personal history of other endocrine, nutritional and metabolic disease: Secondary | ICD-10-CM

## 2017-09-26 DIAGNOSIS — E2839 Other primary ovarian failure: Secondary | ICD-10-CM

## 2017-09-26 DIAGNOSIS — I10 Essential (primary) hypertension: Secondary | ICD-10-CM

## 2017-09-26 DIAGNOSIS — R140 Abdominal distension (gaseous): Secondary | ICD-10-CM

## 2017-09-26 DIAGNOSIS — I700 Atherosclerosis of aorta: Secondary | ICD-10-CM | POA: Diagnosis not present

## 2017-09-26 MED ORDER — NORTRIPTYLINE HCL 75 MG PO CAPS: 75.0000 mg | ORAL_CAPSULE | Freq: Every day | ORAL | 3 refills | Status: DC

## 2017-09-26 MED ORDER — ATORVASTATIN CALCIUM 80 MG PO TABS: 80.0000 mg | ORAL_TABLET | Freq: Every day | ORAL | 3 refills | Status: DC

## 2017-09-26 MED ORDER — LISINOPRIL 10 MG PO TABS
5.0000 mg | ORAL_TABLET | Freq: Every day | ORAL | 3 refills | Status: DC
Start: 2017-09-26 — End: 2018-10-16

## 2017-09-26 MED ORDER — LEVOTHYROXINE SODIUM 100 MCG PO TABS: ORAL_TABLET | ORAL | 3 refills | Status: DC

## 2017-09-26 MED ORDER — NORTRIPTYLINE HCL 25 MG PO CAPS: ORAL_CAPSULE | ORAL | 3 refills | Status: DC

## 2017-09-26 NOTE — Assessment & Plan Note (Signed)
Chronic insomnia  Takes nortriptyline and also temazepam  I am not comfortable px any other sedating medications Disc meditation and sleep hygiene

## 2017-09-26 NOTE — Assessment & Plan Note (Signed)
Several in setting of OP   (as well as other fractures) Former smoker  On ca and D Finally convinced pt do do dexa  Holding evista due to leg pain  Intol of oral bisphosphonate Done with miacalcin   Want to see if she would qualify for Prolia in the future  Start with dexa

## 2017-09-26 NOTE — Patient Instructions (Addendum)
Hold the evista - to see if this helps with leg cramps and pain   We will refer you for a bone density test  I would like to look into a different osteoporosis medication (prolia) if it was covered   Call your local pharmacy to ask about the Shingrix vaccine for shingles prevention If affordable - get on a waiting list for it   For cholesterol Avoid red meat/ fried foods/ egg yolks/ fatty breakfast meats/ butter, cheese and high fat dairy/ and shellfish   Go up from 40 to 80 mg for the atorvastatin (for cholesterol)  If this makes leg pain worse please let us know

## 2017-09-26 NOTE — Assessment & Plan Note (Signed)
Reviewed health habits including diet and exercise and skin cancer prevention Reviewed appropriate screening tests for age  Also reviewed health mt list, fam hx and immunization status , as well as social and family history   Rev AMW dexa ordered Handicapped sticker paperwork done  She declines breast or colon cancer screening of any kind  Fall prev disc in detail  inst pt to call pharmacy to see if shingrix would be covered  Labs reviewed

## 2017-09-26 NOTE — Assessment & Plan Note (Signed)
Hypothyroidism  Pt has no clinical changes No change in energy level/ hair or skin/ edema and no tremor Lab Results  Component Value Date   TSH 2.12 09/22/2017     No change in levothyroxine

## 2017-09-26 NOTE — Assessment & Plan Note (Signed)
bp in fair control at this time  BP Readings from Last 1 Encounters:  09/26/17 136/78   No changes needed Disc lifstyle change with low sodium diet and exercise  Labs reviewed  Continue lisinopril

## 2017-09-26 NOTE — Assessment & Plan Note (Signed)
With constipation  Enc miralax use daily which helps

## 2017-09-26 NOTE — Progress Notes (Signed)
Subjective:    Patient ID: [name] [name], female    DOB: Apr 07, 1946, 72 y.o.   MRN: [id number]  HPI Here for health maintenance exam and to review chronic medical problems    She still sleeps poorly  nortryptyline as well as the temazepam   Had another spinal compression fracture   Wt Readings from Last 3 Encounters:  09/26/17 110 lb 8 oz (50.1 kg)  09/22/17 112 lb 8 oz (51 kg)  08/31/17 111 lb 8 oz (50.6 kg)  stays slim Appetite is very good  19.57 kg/m   amw on 1/17 Hx of fall noted  Mini cog 19/20   (improved from last year)   She noted bilat leg pain and neck pain   Breast cancer screening  Declines mammograms or any other screening  No lumps on self exam   dexa -declines in the past - now interested in it  Taking ca and D  She has had another spinal fracture/compression  Miacalcin was stopped  On evista  Intol of bisphosphenates  Knee she broke still bothers her  Also cramps    Fall occurred outdoors/tripped over a cord    Colon cancer screening -- declines screening of any kind   Zoster status - would be interested in Shingrix if covered   bp is stable today  No cp or palpitations or headaches or edema  No side effects to medicines  BP Readings from Last 3 Encounters:  09/26/17 136/78  09/22/17 110/70  08/31/17 124/78      Hypothyroidism  (post op)  Pt has no clinical changes No change in energy level/ hair or skin/ edema and no tremor Lab Results  Component Value Date   TSH 2.12 09/22/2017      Hyperlipidemia Lab Results  Component Value Date   CHOL 240 (H) 09/22/2017   CHOL 245 (H) 07/02/2016   CHOL 227 (H) 01/02/2016   Lab Results  Component Value Date   HDL 78.00 09/22/2017   HDL 81.20 07/02/2016   HDL 75.60 01/02/2016   Lab Results  Component Value Date   LDLCALC 148 (H) 09/22/2017   LDLCALC 142 (H) 07/02/2016   LDLCALC 138 (H) 01/02/2016   Lab Results  Component Value Date   TRIG 73.0 09/22/2017   TRIG 108.0  07/02/2016   TRIG 66.0 01/02/2016   Lab Results  Component Value Date   CHOLHDL 3 09/22/2017   CHOLHDL 3 07/02/2016   CHOLHDL 3 01/02/2016   Lab Results  Component Value Date   LDLDIRECT 140.9 07/04/2013   LDLDIRECT 140.9 07/10/2012   LDLDIRECT 208.7 05/24/2012   On statin and diet She stays away from fatty foods  LDL is 148  Has hx of aortic calcification as well   On 40 of lipitor-open to inc to 80  Needs handicapped parking form for chronic pain/ortho/legs   Lab Results  Component Value Date   CREATININE 0.72 09/22/2017   BUN 11 09/22/2017   NA 140 09/22/2017   K 4.5 09/22/2017   CL 104 09/22/2017   CO2 31 09/22/2017   Lab Results  Component Value Date   ALT 10 09/22/2017   AST 20 09/22/2017   ALKPHOS 65 09/22/2017   BILITOT 0.3 09/22/2017    Lab Results  Component Value Date   WBC 5.5 09/22/2017   HGB 14.5 09/22/2017   HCT 43.1 09/22/2017   MCV 95.2 09/22/2017   PLT 267.0 09/22/2017     Patient Active Problem List   Diagnosis  Date Noted  . Estrogen deficiency 09/26/2017  . Leg pain, bilateral 09/26/2017  . Aortic calcification ([place]) 11/30/2016  . History of patellar fracture 11/30/2016  . Routine general medical examination at a health care facility 06/27/2016  . Abdominal bloating 06/15/2016  . Cerumen impaction 06/15/2016  . Dyspepsia 06/04/2016  . Loss of weight 06/04/2016  . Cough 04/26/2016  . History of vertebral compression fracture 01/19/2016  . Grief reaction 08/08/2015  . Dysphagia 06/28/2014  . Sebaceous cyst 01/08/2013  . Chronic foot pain 10/27/2011  . Low back pain 04/14/2011  . FEVER BLISTER 11/17/2010  . Essential hypertension 08/18/2009  . BUNION 03/04/2009  . Allergic rhinitis 01/10/2008  . TOBACCO USE, QUIT 02/10/2007  . GOITER, MULTINODULAR 02/09/2007  . Hypothyroidism 02/09/2007  . HYPERCHOLESTEROLEMIA 02/09/2007  . DEPRESSION 02/09/2007  . OSTEOARTHRITIS 02/09/2007  . Osteoporosis 02/09/2007  . Disturbance in sleep  behavior 02/09/2007   Past Medical History:  Diagnosis Date  . Depression   . Hypothyroidism   . Osteoarthritis   . Osteoporosis   . Shingles    arm 1/12  . Sleep disorder   . Tobacco abuse    past; quit 09   Past Surgical History:  Procedure Laterality Date  . BIOPSY THYROID     pos neoplasm, surgery 09/2006  . bunions  06/1998  . CXR-copd, ts partial compression fracture  12/2006  . dexa  12/1996  . hashimoto  02/1997  . hyerectomy- cervical ca cells    . left wrist fracture     surgery  . right thyroid lobectomy, goiter, thyroiditis  12/2006  . stress fractures foot    . THYROIDECTOMY  11/2009   Social History   Tobacco Use  . Smoking status: Former Research scientist (life sciences)  . Smokeless tobacco: Never Used  . Tobacco comment: Quit 10/09  Substance Use Topics  . Alcohol use: No    Alcohol/week: 0.0 oz  . Drug use: No   Family History  Problem Relation Age of Onset  . Stroke Father   . Hypertension Father   . Diabetes Mother   . Coronary artery disease Mother    Allergies  Allergen Reactions  . Evista [Raloxifene Hcl]     Leg pain and cramps   . Fosamax [Alendronate Sodium]     Dysphagia and heartburn    Current Outpatient Medications on File Prior to Visit  Medication Sig Dispense Refill  . albuterol (PROVENTIL HFA;VENTOLIN HFA) 108 (90 BASE) MCG/ACT inhaler Inhale 2 puffs into the lungs every 4 (four) hours as needed for wheezing. 1 Inhaler 11  . benzonatate (TESSALON) 200 MG capsule Take 1 capsule (200 mg total) by mouth 3 (three) times daily as needed. Swallow whole, do not bite pill 30 capsule 1  . Calcium Carbonate-Vit D-Min (CALCIUM 1200 PO) Take by mouth daily.      Marland Kitchen desipramine (NOPRAMIN) 100 MG tablet Take 100 mg by mouth at bedtime.    Marland Kitchen loratadine (CLARITIN) 10 MG tablet Take 1 tablet (10 mg total) by mouth daily.    . mupirocin ointment (BACTROBAN) 2 % Apply to affected area twice daily 15 g 1  . temazepam (RESTORIL) 30 MG capsule TAKE 1 CAPSULE AT BEDTIME 90  capsule 1   No current facility-administered medications on file prior to visit.     Review of Systems  Constitutional: Positive for fatigue. Negative for activity change, appetite change, fever and unexpected weight change.  HENT: Negative for congestion, ear pain, rhinorrhea, sinus pressure and sore throat.  Eyes: Negative for pain, redness and visual disturbance.  Respiratory: Negative for cough, shortness of breath and wheezing.   Cardiovascular: Negative for chest pain and palpitations.  Gastrointestinal: Negative for abdominal pain, blood in stool, constipation and diarrhea.  Endocrine: Negative for polydipsia and polyuria.  Genitourinary: Negative for dysuria, frequency and urgency.  Musculoskeletal: Positive for arthralgias and back pain. Negative for myalgias.  Skin: Negative for pallor and rash.  Allergic/Immunologic: Negative for environmental allergies.  Neurological: Negative for dizziness, tremors, seizures, syncope, speech difficulty, weakness, light-headedness, numbness and headaches.  Hematological: Negative for adenopathy. Does not bruise/bleed easily.  Psychiatric/Behavioral: Positive for sleep disturbance. Negative for decreased concentration and dysphoric mood. The patient is not nervous/anxious.        Objective:   Physical Exam  Constitutional: She appears well-developed and well-nourished. No distress.  Slim and frail appearing elderly female   HENT:  Head: Normocephalic and atraumatic.  Right Ear: External ear normal.  Left Ear: External ear normal.  Mouth/Throat: Oropharynx is clear and moist.  Eyes: Conjunctivae and EOM are normal. Pupils are equal, round, and reactive to light. No scleral icterus.  Neck: Normal range of motion. Neck supple. No JVD present. Carotid bruit is not present. No thyromegaly present.  Cardiovascular: Normal rate, regular rhythm, normal heart sounds and intact distal pulses. Exam reveals no gallop.  Pulmonary/Chest: Effort normal  and breath sounds normal. No respiratory distress. She has no wheezes. She exhibits no tenderness.  Abdominal: Soft. Bowel sounds are normal. She exhibits no distension, no abdominal bruit, no pulsatile midline mass and no mass. There is no tenderness.  Genitourinary: No breast swelling, tenderness, discharge or bleeding.  Genitourinary Comments: Breast exam: No mass, nodules, thickening, tenderness, bulging, retraction, inflamation, nipple discharge or skin changes noted.  No axillary or clavicular LA.      Musculoskeletal: Normal range of motion. She exhibits no edema or tenderness.  Significant kyphosis   Lymphadenopathy:    She has no cervical adenopathy.  Neurological: She is alert. She has normal reflexes. No cranial nerve deficit. She exhibits normal muscle tone. Coordination normal.  Skin: Skin is warm and dry. No rash noted. No erythema. No pallor.  SKs diffusely Solar lentigines diffusely -trunk and back    Psychiatric: She has a normal mood and affect.          Assessment & Plan:   Problem List Items Addressed This Visit      Cardiovascular and Mediastinum   Aortic calcification ([place])    Aware Asymptomatic  Would like to have tighter control of lipids  Inc atorvastatin to 80 mg  If worse leg pain- update (unsure if this adds to that)  Diet disc  Thankfully no longer smokes       Relevant Medications   atorvastatin (LIPITOR) 80 MG tablet   lisinopril (PRINIVIL,ZESTRIL) 10 MG tablet   Essential hypertension    bp in fair control at this time  BP Readings from Last 1 Encounters:  09/26/17 136/78   No changes needed Disc lifstyle change with low sodium diet and exercise  Labs reviewed  Continue lisinopril       Relevant Medications   atorvastatin (LIPITOR) 80 MG tablet   lisinopril (PRINIVIL,ZESTRIL) 10 MG tablet     Endocrine   Hypothyroidism    Hypothyroidism  Pt has no clinical changes No change in energy level/ hair or skin/ edema and no  tremor Lab Results  Component Value Date   TSH 2.12 09/22/2017     No  change in levothyroxine       Relevant Medications   levothyroxine (SYNTHROID, LEVOTHROID) 100 MCG tablet     Musculoskeletal and Integument   History of vertebral compression fracture    Several in setting of OP   (as well as other fractures) Former smoker  On ca and D Finally convinced pt do do dexa  Holding evista due to leg pain  Intol of oral bisphosphonate Done with miacalcin   Want to see if she would qualify for Prolia in the future  Start with dexa         Osteoporosis    Intol of bisphosphonate  Holding evista due to leg pain (unsure if cause) Done with miacalcin Many fractures- recent compression spinal  Also kyphosis Former smoker On ca and D Convinced to get dexa - then with that data would like to see if Prolia would be covered  Info given for pt to read about medication        Other   Abdominal bloating    With constipation  Enc miralax use daily which helps       Disturbance in sleep behavior    Chronic insomnia  Takes nortriptyline and also temazepam  I am not comfortable px any other sedating medications Disc meditation and sleep hygiene       Estrogen deficiency    Ref for dexa Post menopausal      Relevant Orders   DG Bone Density   H/O goiter   HYPERCHOLESTEROLEMIA    LDL is still not at goal at 148 Will inc atorvastatin to 80 mg  If leg pain inc she will alert [place] immediately  Re check lipid in 2 mo  Diet rev       Relevant Medications   atorvastatin (LIPITOR) 80 MG tablet   lisinopril (PRINIVIL,ZESTRIL) 10 MG tablet   Leg pain, bilateral    Will hold evista first to see if that helps If not-consider statin as a cause?  No doubt arthritis and mechanical causes as well  Handicapped forms for dmv filled out for bad days Enc cane or walker for this and fall prev       Routine general medical examination at a health care facility - Primary    Reviewed  health habits including diet and exercise and skin cancer prevention Reviewed appropriate screening tests for age  Also reviewed health mt list, fam hx and immunization status , as well as social and family history   Rev AMW dexa ordered Handicapped sticker paperwork done  She declines breast or colon cancer screening of any kind  Fall prev disc in detail  inst pt to call pharmacy to see if shingrix would be covered  Labs reviewed

## 2017-09-26 NOTE — Assessment & Plan Note (Signed)
Intol of bisphosphonate  Holding evista due to leg pain (unsure if cause) Done with miacalcin Many fractures- recent compression spinal  Also kyphosis Former smoker On ca and D Convinced to get dexa - then with that data would like to see if Prolia would be covered  Info given for pt to read about medication

## 2017-09-26 NOTE — Assessment & Plan Note (Signed)
Ref for dexa Post menopausal

## 2017-09-26 NOTE — Assessment & Plan Note (Addendum)
Aware Asymptomatic  Would like to have tighter control of lipids  Inc atorvastatin to 80 mg  If worse leg pain- update (unsure if this adds to that)  Diet disc  Thankfully no longer smokes

## 2017-09-26 NOTE — Assessment & Plan Note (Signed)
Will hold evista first to see if that helps If not-consider statin as a cause?  No doubt arthritis and mechanical causes as well  Handicapped forms for dmv filled out for bad days Enc cane or walker for this and fall prev

## 2017-09-26 NOTE — Assessment & Plan Note (Signed)
LDL is still not at goal at 148 Will inc atorvastatin to 80 mg  If leg pain inc she will alert [place] immediately  Re check lipid in 2 mo  Diet rev

## 2017-10-20 ENCOUNTER — Ambulatory Visit
Admission: RE | Admit: 2017-10-20 | Discharge: 2017-10-20 | Disposition: A | Discharge status: Home / Self Care | Payer: Medicare HMO | Source: Ambulatory Visit | Attending: Family Medicine | Admitting: Family Medicine

## 2017-10-20 DIAGNOSIS — E2839 Other primary ovarian failure: Secondary | ICD-10-CM | POA: Diagnosis not present

## 2017-10-20 DIAGNOSIS — M810 Age-related osteoporosis without current pathological fracture: Secondary | ICD-10-CM | POA: Diagnosis not present

## 2017-10-20 DIAGNOSIS — M818 Other osteoporosis without current pathological fracture: Secondary | ICD-10-CM | POA: Diagnosis not present

## 2017-10-21 ENCOUNTER — Telehealth: Payer: Self-pay

## 2017-10-21 NOTE — Telephone Encounter (Signed)
Earl Lagos will you please start the process for her?   Thanks

## 2017-10-21 NOTE — Telephone Encounter (Signed)
Thank you :)

## 2017-10-21 NOTE — Telephone Encounter (Signed)
Yes, I already had her benefits ran. There was a question about her coverage and amgen, the reps for prolia suggested it be re-run. I will contact the pt directly with coverage information once received

## 2017-10-21 NOTE — Telephone Encounter (Signed)
Tower, [name] Fanny, MD  [place], [place] M, CMA        Bone density confirms osteoporosis  I would like to see if we could get prolia covered by her insurance to help prevent fractures  Let me know if she is agreeable  Last ca and renal panel nl  If ok- I will send to [facility]  She has had multiple fractures    MT-Pt agrees to Prolia and will await call about coverage/thx dmf

## 2017-10-24 ENCOUNTER — Other Ambulatory Visit: Payer: Self-pay | Admitting: Family Medicine

## 2017-10-24 NOTE — Telephone Encounter (Signed)
CPE on 09/26/17 and next years is scheduled, last filled on 05/06/17 #90 caps with 1 additional refills

## 2017-10-24 NOTE — Telephone Encounter (Signed)
Is it too early for a refill? Was 8/31

## 2017-10-26 NOTE — Telephone Encounter (Signed)
It's a little early because it has to go through her mail order pharmacy and it could take up to 14 days to ship med to her

## 2017-10-26 NOTE — Telephone Encounter (Signed)
Rx faxed

## 2017-10-26 NOTE — Telephone Encounter (Signed)
Printed to fax  

## 2017-11-01 ENCOUNTER — Ambulatory Visit (INDEPENDENT_AMBULATORY_CARE_PROVIDER_SITE_OTHER): Payer: Medicare HMO | Admitting: Family Medicine

## 2017-11-01 ENCOUNTER — Encounter: Payer: Self-pay | Admitting: Family Medicine

## 2017-11-01 VITALS — BP 126/74 | HR 66 | Temp 97.7°F | Ht 63.0 in | Wt 112.0 lb

## 2017-11-01 DIAGNOSIS — R197 Diarrhea, unspecified: Secondary | ICD-10-CM | POA: Diagnosis not present

## 2017-11-01 DIAGNOSIS — Z1211 Encounter for screening for malignant neoplasm of colon: Secondary | ICD-10-CM

## 2017-11-01 DIAGNOSIS — R140 Abdominal distension (gaseous): Secondary | ICD-10-CM

## 2017-11-01 DIAGNOSIS — R1084 Generalized abdominal pain: Secondary | ICD-10-CM | POA: Insufficient documentation

## 2017-11-01 LAB — CBC WITH DIFFERENTIAL/PLATELET
Basophils Absolute: 0 10*3/uL (ref 0.0–0.1)
Basophils Relative: 0.7 % (ref 0.0–3.0)
Eosinophils Absolute: 0.1 10*3/uL (ref 0.0–0.7)
Eosinophils Relative: 1.2 % (ref 0.0–5.0)
HCT: 43.1 % (ref 36.0–46.0)
Hemoglobin: 14.5 g/dL (ref 12.0–15.0)
Lymphocytes Relative: 31.5 % (ref 12.0–46.0)
Lymphs Abs: 1.7 10*3/uL (ref 0.7–4.0)
MCHC: 33.6 g/dL (ref 30.0–36.0)
MCV: 94 fl (ref 78.0–100.0)
Monocytes Absolute: 0.5 10*3/uL (ref 0.1–1.0)
Monocytes Relative: 8.3 % (ref 3.0–12.0)
Neutro Abs: 3.2 10*3/uL (ref 1.4–7.7)
Neutrophils Relative %: 58.3 % (ref 43.0–77.0)
Platelets: 251 10*3/uL (ref 150.0–400.0)
RBC: 4.59 Mil/uL (ref 3.87–5.11)
RDW: 14.3 % (ref 11.5–15.5)
WBC: 5.4 10*3/uL (ref 4.0–10.5)

## 2017-11-01 LAB — LIPASE: Lipase: 8 U/L — ABNORMAL LOW (ref 11.0–59.0)

## 2017-11-01 LAB — COMPREHENSIVE METABOLIC PANEL
ALT: 10 U/L (ref 0–35)
AST: 16 U/L (ref 0–37)
Albumin: 3.9 g/dL (ref 3.5–5.2)
Alkaline Phosphatase: 64 U/L (ref 39–117)
BUN: 9 mg/dL (ref 6–23)
CO2: 31 mEq/L (ref 19–32)
Calcium: 9.8 mg/dL (ref 8.4–10.5)
Chloride: 105 mEq/L (ref 96–112)
Creatinine, Ser: 0.71 mg/dL (ref 0.40–1.20)
GFR: 85.97 mL/min (ref >60.00)
Glucose, Bld: 99 mg/dL (ref 70–99)
Potassium: 5.5 mEq/L — ABNORMAL HIGH (ref 3.5–5.1)
Sodium: 141 mEq/L (ref 135–145)
Total Bilirubin: 0.4 mg/dL (ref 0.2–1.2)
Total Protein: 6.8 g/dL (ref 6.0–8.3)

## 2017-11-01 NOTE — Assessment & Plan Note (Signed)
Brief episode on Saturday with some low abd cramping Much improved now  Had nl bm this am  Suspect may have been viral  Lab today  Update if symptoms return

## 2017-11-01 NOTE — Assessment & Plan Note (Addendum)
Interested in screening colonoscopy Also has hx of chronic constipation and bloating  Will ref to GI in [place]

## 2017-11-01 NOTE — Assessment & Plan Note (Signed)
With chronic constipation  miralax did help  Is interested in screening colonoscopy Will ref to GI in [facility]

## 2017-11-01 NOTE — Progress Notes (Signed)
Subjective:    Patient ID: [name] [name], female    DOB: March 08, 1946, 72 y.o.   MRN: [id number]  HPI Here for abdominal pain and diarrhea   Wt Readings from Last 3 Encounters:  11/01/17 112 lb (50.8 kg)  09/26/17 110 lb 8 oz (50.1 kg)  09/22/17 112 lb 8 oz (51 kg)   19.84 kg/m   About a week of symptoms  Had pains coming and going in lower abdominal pain  All the way across  Severe at times- could double her over  Worse after eating (anything)  Feels like cramping  Then diarrhea started on Saturday - loose stool/ not watery  No blood  Frequent - every hour lasting the first half of the day   She still has trouble with constipation (miralax helped)   No fever  Some nausea comes and goes but no vomiting   Some fatigue (this is chronic) -does not sleep well at night   Did not take any medicines over the counter   No abdominal pain today -- last pain was yesterday about 3 pm  No diarrhea today  Normal bm this am  No nausea  Ate muffine and coffee this am   No sick exposures  She has never had a colonoscopy for screening- she is interested in that now    occ takes otc nsaid for ha -not lately and not often  Does get heartburn at times   Patient Active Problem List   Diagnosis Date Noted  . Generalized abdominal pain 11/01/2017  . Diarrhea 11/01/2017  . Colon cancer screening 11/01/2017  . Estrogen deficiency 09/26/2017  . Leg pain, bilateral 09/26/2017  . Aortic calcification ([place]) 11/30/2016  . History of patellar fracture 11/30/2016  . Routine general medical examination at a health care facility 06/27/2016  . Abdominal bloating 06/15/2016  . Dyspepsia 06/04/2016  . Loss of weight 06/04/2016  . History of vertebral compression fracture 01/19/2016  . Chronic foot pain 10/27/2011  . Low back pain 04/14/2011  . FEVER BLISTER 11/17/2010  . Essential hypertension 08/18/2009  . BUNION 03/04/2009  . Allergic rhinitis 01/10/2008  . TOBACCO USE, QUIT  02/10/2007  . H/O goiter 02/09/2007  . Hypothyroidism 02/09/2007  . HYPERCHOLESTEROLEMIA 02/09/2007  . DEPRESSION 02/09/2007  . OSTEOARTHRITIS 02/09/2007  . Osteoporosis 02/09/2007  . Disturbance in sleep behavior 02/09/2007   Past Medical History:  Diagnosis Date  . Depression   . Hypothyroidism   . Osteoarthritis   . Osteoporosis   . Shingles    arm 1/12  . Sleep disorder   . Tobacco abuse    past; quit 09   Past Surgical History:  Procedure Laterality Date  . BIOPSY THYROID     pos neoplasm, surgery 09/2006  . bunions  06/1998  . CXR-copd, ts partial compression fracture  12/2006  . dexa  12/1996  . hashimoto  02/1997  . hyerectomy- cervical ca cells    . left wrist fracture     surgery  . right thyroid lobectomy, goiter, thyroiditis  12/2006  . stress fractures foot    . THYROIDECTOMY  11/2009   Social History   Tobacco Use  . Smoking status: Former Research scientist (life sciences)  . Smokeless tobacco: Never Used  . Tobacco comment: Quit 10/09  Substance Use Topics  . Alcohol use: No    Alcohol/week: 0.0 oz  . Drug use: No   Family History  Problem Relation Age of Onset  . Stroke Father   . Hypertension Father   .  Diabetes Mother   . Coronary artery disease Mother    Allergies  Allergen Reactions  . Evista [Raloxifene Hcl]     Leg pain and cramps   . Fosamax [Alendronate Sodium]     Dysphagia and heartburn    Current Outpatient Medications on File Prior to Visit  Medication Sig Dispense Refill  . albuterol (PROVENTIL HFA;VENTOLIN HFA) 108 (90 BASE) MCG/ACT inhaler Inhale 2 puffs into the lungs every 4 (four) hours as needed for wheezing. 1 Inhaler 11  . atorvastatin (LIPITOR) 80 MG tablet Take 1 tablet (80 mg total) by mouth daily. 90 tablet 3  . benzonatate (TESSALON) 200 MG capsule Take 1 capsule (200 mg total) by mouth 3 (three) times daily as needed. Swallow whole, do not bite pill 30 capsule 1  . Calcium Carbonate-Vit D-Min (CALCIUM 1200 PO) Take by mouth daily.      Marland Kitchen  levothyroxine (SYNTHROID, LEVOTHROID) 100 MCG tablet TAKE 1 TABLET EVERY DAY BEFORE BREAKFAST 90 tablet 3  . lisinopril (PRINIVIL,ZESTRIL) 10 MG tablet Take 0.5 tablets (5 mg total) by mouth daily. 45 tablet 3  . loratadine (CLARITIN) 10 MG tablet Take 1 tablet (10 mg total) by mouth daily.    . mupirocin ointment (BACTROBAN) 2 % Apply to affected area twice daily 15 g 1  . nortriptyline (PAMELOR) 25 MG capsule TAKE 1 CAPSULE AT BEDTIME WITH A 75MG  CAPSULE 90 capsule 3  . nortriptyline (PAMELOR) 75 MG capsule Take 1 capsule (75 mg total) by mouth at bedtime. 90 capsule 3  . temazepam (RESTORIL) 30 MG capsule TAKE 1 CAPSULE AT BEDTIME 90 capsule 1   No current facility-administered medications on file prior to visit.     Review of Systems  Constitutional: Negative for activity change, appetite change, fatigue, fever and unexpected weight change.  HENT: Negative for congestion, ear pain, rhinorrhea, sinus pressure and sore throat.   Eyes: Negative for pain, redness and visual disturbance.  Respiratory: Negative for cough, shortness of breath and wheezing.   Cardiovascular: Negative for chest pain and palpitations.  Gastrointestinal: Positive for abdominal pain, constipation and nausea. Negative for abdominal distention, anal bleeding, blood in stool, diarrhea, rectal pain and vomiting.       Pos for intermittent heartburn and epigastric burning   Diarrhea is resolved today  Pos for chronic constipation   Endocrine: Negative for polydipsia and polyuria.  Genitourinary: Negative for dysuria, frequency and urgency.  Musculoskeletal: Negative for arthralgias, back pain and myalgias.  Skin: Negative for pallor and rash.  Allergic/Immunologic: Negative for environmental allergies.  Neurological: Negative for dizziness, syncope and headaches.  Hematological: Negative for adenopathy. Does not bruise/bleed easily.  Psychiatric/Behavioral: Positive for sleep disturbance. Negative for decreased  concentration and dysphoric mood. The patient is not nervous/anxious.        Objective:   Physical Exam  Constitutional: She appears well-developed and well-nourished. No distress.  Slim well appearing elderly female  HENT:  Head: Normocephalic and atraumatic.  Mouth/Throat: Oropharynx is clear and moist.  Eyes: Conjunctivae and EOM are normal. Pupils are equal, round, and reactive to light. No scleral icterus.  Neck: Normal range of motion. Neck supple.  Cardiovascular: Normal rate, regular rhythm and normal heart sounds.  Pulmonary/Chest: Breath sounds normal. No respiratory distress. She has no wheezes.  Abdominal: Soft. Bowel sounds are normal. She exhibits no distension, no abdominal bruit, no pulsatile midline mass and no mass. There is no hepatosplenomegaly. There is tenderness in the right lower quadrant, epigastric area, left upper quadrant and  left lower quadrant. There is no rigidity, no rebound, no guarding, no CVA tenderness, no tenderness at McBurney's point and negative Murphy's sign.  Musculoskeletal:  Kyphosis noted   Lymphadenopathy:    She has no cervical adenopathy.  Neurological: She is alert. No cranial nerve deficit. She exhibits normal muscle tone.  Skin: Skin is warm and dry. No rash noted. No erythema. No pallor.  Psychiatric: She has a normal mood and affect.          Assessment & Plan:   Problem List Items Addressed This Visit      Other   Abdominal bloating    With chronic constipation  miralax did help  Is interested in screening colonoscopy Will ref to GI in [place]       Relevant Orders   Ambulatory referral to Gastroenterology   Colon cancer screening    Interested in screening colonoscopy Also has hx of chronic constipation and bloating  Will ref to GI in [place]       Relevant Orders   Ambulatory referral to Gastroenterology   Diarrhea    Brief episode on Saturday with some low abd cramping Much improved now  Had nl bm  this am  Suspect may have been viral  Lab today  Update if symptoms return      Generalized abdominal pain - Primary    On and off for a week -none now  Some epigastric tenderness on exam -recommended otc ranitidine for epigastric pain or heartburn and avoidance of nsaids  Labs today  Also some mild LLQ tenderness (check cbc)  Diarrhea is resolved - ? Perhaps viral  Update from there       Relevant Orders   CBC with Differential/Platelet   Comprehensive metabolic panel   Lipase

## 2017-11-01 NOTE — Patient Instructions (Addendum)
If you have heartburn or upper abdomen pain (above the belly button)- take zantac over the counter 150 mg up to twice daily (store brand is fine - ranitidine)   miralax for constipation  I'm glad the diarrhea is better-you may have had a virus   Let's do some labs for abdominal pain today   Also a referral to GI - to discuss colonoscopy/screening and also chronic constipation

## 2017-11-01 NOTE — Assessment & Plan Note (Signed)
On and off for a week -none now  Some epigastric tenderness on exam -recommended otc ranitidine for epigastric pain or heartburn and avoidance of nsaids  Labs today  Also some mild LLQ tenderness (check cbc)  Diarrhea is resolved - ? Perhaps viral  Update from there

## 2017-11-08 ENCOUNTER — Other Ambulatory Visit (INDEPENDENT_AMBULATORY_CARE_PROVIDER_SITE_OTHER): Payer: Medicare HMO

## 2017-11-08 DIAGNOSIS — I10 Essential (primary) hypertension: Secondary | ICD-10-CM

## 2017-11-08 LAB — POTASSIUM: Potassium: 4.6 mEq/L (ref 3.5–5.1)

## 2017-11-10 NOTE — Telephone Encounter (Signed)
Verification of benefits have been processed and an approval has been received for pts prolia injection. Pts estimated cost are appx $245. This is only an estimate and cannot be confirmed until benefits are paid. Please advise pt and schedule if needed. If scheduled, once the injection is received, pls contact me back with the date it was received so that I am able to update prolia folder. thanks  

## 2017-11-10 NOTE — Telephone Encounter (Signed)
Spoke to pt who is agreeable to charge; Ca lab up to date and prolia injection scheduled

## 2017-11-14 ENCOUNTER — Encounter: Payer: Self-pay | Admitting: Gastroenterology

## 2017-11-14 ENCOUNTER — Ambulatory Visit: Payer: Medicare HMO | Admitting: Gastroenterology

## 2017-11-14 ENCOUNTER — Other Ambulatory Visit: Payer: Self-pay

## 2017-11-14 VITALS — BP 176/73 | HR 76 | Ht 63.0 in | Wt 116.0 lb

## 2017-11-14 DIAGNOSIS — R1030 Lower abdominal pain, unspecified: Secondary | ICD-10-CM | POA: Diagnosis not present

## 2017-11-14 DIAGNOSIS — R1310 Dysphagia, unspecified: Secondary | ICD-10-CM

## 2017-11-14 NOTE — Patient Instructions (Signed)
F/U 3 months Need cardiac clearance-c/o chest pain

## 2017-11-14 NOTE — Progress Notes (Signed)
[name] [name] [address]  [place], [place] [address]  Main: [phone]  Fax: [phone]   Gastroenterology Consultation  Referring Provider:     Abner Greenspan, MD Primary Care Physician:  Tower, Wynelle Fanny, MD Primary Gastroenterologist:  Dr. Vonda [name] Reason for Consultation:     Loose stools, colonoscopy, abdominal pain        HPI:   [name] [name] is a 72 y.o. y/o female referred for consultation & management  by Dr. Glori Bickers, Wynelle Fanny, MD.  Patient reports, she had one day last week, where she had multiple loose stools.  No blood associated with the loose stools.  This lasted for half a day and self resolved.  Prior to that she describes 1 to 2 well-formed bowel movements daily.  At times she describes having hard stools.  No blood in stools, even with straining.  Since that episode last week, she is back to her regular bowel, 1-2 bowel movements daily.  However, she describes some constipation over the last week.  No family history of colon cancer.  No previous colonoscopies.  Patient also reports 1-92-month history of intermittent nausea.  Describes 1-2 episodes of emesis a week, states this mostly consists of water.  No hematemesis.  Also describes dysphagia with solids over the last 1-2 months.  No episodes of food impactions.  Describes bilateral lower quadrant abdominal cramping or pain intermittently.  Unrelated to meals.  States bowel movements improves the pain but does not completely resolve it.  No epigastric pain.  States has heartburn daily and takes Tums for it which resolves the symptoms.  Also describes intermittent chest pain unrelated to exertion that she states she discussed with her primary care provider.  Does not have any chest pain at this time.  States she is seeing her primary care doctor tomorrow for this.  Past Medical History:  Diagnosis Date  . Depression   . Hypothyroidism   . Osteoarthritis   . Osteoporosis   . Shingles      arm 1/12  . Sleep disorder   . Tobacco abuse    past; quit 09    Past Surgical History:  Procedure Laterality Date  . BIOPSY THYROID     pos neoplasm, surgery 09/2006  . bunions  06/1998  . CXR-copd, ts partial compression fracture  12/2006  . dexa  12/1996  . hashimoto  02/1997  . hyerectomy- cervical ca cells    . left wrist fracture     surgery  . right thyroid lobectomy, goiter, thyroiditis  12/2006  . stress fractures foot    . THYROIDECTOMY  11/2009    Prior to Admission medications   Medication Sig Start Date End Date Taking? Authorizing Provider  albuterol (PROVENTIL HFA;VENTOLIN HFA) 108 (90 BASE) MCG/ACT inhaler Inhale 2 puffs into the lungs every 4 (four) hours as needed for wheezing. 06/28/14   Tower, Wynelle Fanny, MD  atorvastatin (LIPITOR) 80 MG tablet Take 1 tablet (80 mg total) by mouth daily. 09/26/17   Tower, Wynelle Fanny, MD  benzonatate (TESSALON) 200 MG capsule Take 1 capsule (200 mg total) by mouth 3 (three) times daily as needed. Swallow whole, do not bite pill 11/30/16   Tower, Wynelle Fanny, MD  Calcium Carbonate-Vit D-Min (CALCIUM 1200 PO) Take by mouth daily.      [provider]  levothyroxine (SYNTHROID, LEVOTHROID) 100 MCG tablet TAKE 1 TABLET EVERY DAY BEFORE BREAKFAST 09/26/17   Tower, Wynelle Fanny, MD  lisinopril (  PRINIVIL,ZESTRIL) 10 MG tablet Take 0.5 tablets (5 mg total) by mouth daily. 09/26/17   Tower, Wynelle Fanny, MD  loratadine (CLARITIN) 10 MG tablet Take 1 tablet (10 mg total) by mouth daily. 04/26/16   Tonia Ghent, MD  mupirocin ointment Drue Stager) 2 % Apply to affected area twice daily 01/02/14   Tower, Wynelle Fanny, MD  nortriptyline (PAMELOR) 25 MG capsule TAKE 1 CAPSULE AT BEDTIME WITH A 75MG  CAPSULE 09/26/17   Tower, Wynelle Fanny, MD  nortriptyline (PAMELOR) 75 MG capsule Take 1 capsule (75 mg total) by mouth at bedtime. 09/26/17   Tower, Wynelle Fanny, MD  temazepam (RESTORIL) 30 MG capsule TAKE 1 CAPSULE AT BEDTIME 10/26/17   Tower, Wynelle Fanny, MD    Family History   Problem Relation Age of Onset  . Stroke Father   . Hypertension Father   . Diabetes Mother   . Coronary artery disease Mother      Social History   Tobacco Use  . Smoking status: Former Research scientist (life sciences)  . Smokeless tobacco: Never Used  . Tobacco comment: Quit 10/09  Substance Use Topics  . Alcohol use: No    Alcohol/week: 0.0 oz  . Drug use: No    Allergies as of 11/14/2017 - Review Complete 11/01/2017  Allergen Reaction Noted  . Evista [raloxifene hcl]  09/26/2017  . Fosamax [alendronate sodium]  06/28/2014    Review of Systems:    [name] systems reviewed and negative except where noted in HPI.   Physical Exam:  BP (!) 176/73   Pulse 76   Ht 5\' 3"  (1.6 m)   Wt 116 lb (52.6 kg)   BMI 20.55 kg/m  No LMP recorded. Patient has had a hysterectomy. Psych:  Alert and cooperative. Normal mood and affect. General:   Alert,  Well-developed, well-nourished, pleasant and cooperative in NAD Head:  Normocephalic and atraumatic. Eyes:  Sclera clear, no icterus.   Conjunctiva pink. Ears:  Normal auditory acuity. Nose:  No deformity, discharge, or lesions. Mouth:  No deformity or lesions,oropharynx pink & moist. Neck:  Supple; no masses or thyromegaly. Lungs:  Respirations even and unlabored.  Clear throughout to auscultation.   No wheezes, crackles, or rhonchi. No acute distress. Heart:  Regular rate and rhythm; no murmurs, clicks, rubs, or gallops. Abdomen:  Normal bowel sounds.  No bruits.  Soft, non-tender and non-distended without masses, hepatosplenomegaly or hernias noted.  No guarding or rebound tenderness.    Msk:  Symmetrical without gross deformities. Good, equal movement & strength bilaterally. Pulses:  Normal pulses noted. Extremities:  No clubbing or edema.  No cyanosis. Neurologic:  Alert and oriented x3;  grossly normal neurologically. Skin:  Intact without significant lesions or rashes. No jaundice. Lymph Nodes:  No significant cervical adenopathy. Psych:  Alert and  cooperative. Normal mood and affect.   Labs: CBC    Component Value Date/Time   WBC 5.4 11/01/2017 1051   RBC 4.59 11/01/2017 1051   HGB 14.5 11/01/2017 1051   HGB 14.2 12/13/2013 1904   HCT 43.1 11/01/2017 1051   HCT 42.2 12/13/2013 1904   PLT 251.0 11/01/2017 1051   PLT 222 12/13/2013 1904   MCV 94.0 11/01/2017 1051   MCV 93 12/13/2013 1904   MCH 32.5 10/17/2016 2216   MCHC 33.6 11/01/2017 1051   RDW 14.3 11/01/2017 1051   RDW 14.2 12/13/2013 1904   LYMPHSABS 1.7 11/01/2017 1051   MONOABS 0.5 11/01/2017 1051   EOSABS 0.1 11/01/2017 1051   BASOSABS 0.0 11/01/2017 1051  CMP     Component Value Date/Time   NA 141 11/01/2017 1051   NA 138 12/13/2013 1904   K 4.6 11/08/2017 1009   K 3.6 12/13/2013 1904   CL 105 11/01/2017 1051   CL 104 12/13/2013 1904   CO2 31 11/01/2017 1051   CO2 29 12/13/2013 1904   GLUCOSE 99 11/01/2017 1051   GLUCOSE 136 (H) 12/13/2013 1904   BUN 9 11/01/2017 1051   BUN 12 12/13/2013 1904   CREATININE 0.71 11/01/2017 1051   CREATININE 0.72 12/13/2013 1904   CALCIUM 9.8 11/01/2017 1051   CALCIUM 8.6 12/13/2013 1904   PROT 6.8 11/01/2017 1051   ALBUMIN 3.9 11/01/2017 1051   AST 16 11/01/2017 1051   ALT 10 11/01/2017 1051   ALKPHOS 64 11/01/2017 1051   BILITOT 0.4 11/01/2017 1051   GFRNONAA >60 10/17/2016 2216   GFRNONAA >60 12/13/2013 1904   GFRAA >60 10/17/2016 2216   GFRAA >60 12/13/2013 1904    Imaging Studies: Dg Bone Density  Result Date: 10/20/2017 EXAM: DUAL X-RAY ABSORPTIOMETRY (DXA) FOR BONE MINERAL DENSITY IMPRESSION: Dear Dr. Glori Bickers, Your patient Kathrene Sinopoli completed a BMD test on 10/20/2017 using the [place] (analysis version: 14.10) manufactured by [employer]. The following summarizes the results of our evaluation. PATIENT BIOGRAPHICAL: Name: Shantell, Belongia Patient ID: [id number] Birth Date: [birth year] Height: 63.0 in. Gender: Female Exam Date: 10/20/2017 Weight: 113.3 lbs. Indications: Advanced Age,  Caucasian, COPD, History of Fracture (Adult), Hypothyroid, Hysterectomy, Oophorectomy Bilateral, Osteoarthritis, Osteoporotic, Previous Smoker, Surgical Induced Menopause Fractures: Left patella, Spine Treatments: Albuterol, calcium with vit D, Claritin, Synthroid ASSESSMENT: The BMD measured at Femur Total Right is 0.558 g/cm2 with a T-score of -3.6. This patient is considered osteoporotic according to [facility]) criteria. L-4 was excluded due to degenerative changes. Site Region Measured Measured WHO Young Adult BMD Date       Age      Classification T-score AP Spine L1-L3 10/20/2017 [age] Osteoporosis -3.4 0.759 g/cm2 DualFemur Total Right 10/20/2017 72.1 Osteoporosis -3.6 0.558 g/cm2 World Health Organization [facility]) criteria for post-menopausal, Caucasian Women: Normal:       T-score at or above -1 SD Osteopenia:   T-score between -1 and -2.5 SD Osteoporosis: T-score at or below -2.5 SD RECOMMENDATIONS: [place] recommends that FDA-approved medical therapies be considered in postmenopausal women and men age 36 or older with a: 1. Hip or vertebral (clinical or morphometric) fracture. 2. T-score of < -2.5 at the spine or hip. 3. Ten-year fracture probability by FRAX of 3% or greater for hip fracture or 20% or greater for major osteoporotic fracture. [name] treatment decisions require clinical judgment and consideration of individual patient factors, including patient preferences, co-morbidities, previous drug use, risk factors not captured in the FRAX model (e.g. falls, vitamin D deficiency, increased bone turnover, interval significant decline in bone density) and possible under - or over-estimation of fracture risk by FRAX. [name] patients should ensure an adequate intake of dietary calcium (1200 mg/d) and vitamin D (800 IU daily) unless contraindicated. FOLLOW-UP: People with diagnosed cases of osteoporosis or at high risk for fracture should have regular bone mineral  density tests. For patients eligible for Medicare, routine testing is allowed once every 2 years. The testing frequency can be increased to one year for patients who have rapidly progressing disease, those who are receiving or discontinuing medical therapy to restore bone mass, or have additional risk factors. I have reviewed this report, and agree with the  above findings. [facility] Electronically Signed   By: Lowella Grip III M.D.   On: 10/20/2017 09:24    Assessment and Plan:   KARESS HARNER is a 72 y.o. y/o female has been referred for one episode of loose stools that occurred 1 week ago and self resolved, no previous colonoscopies, intermittent bilateral lower quadrant abdominal pain  Patient needs a screening colonoscopy as she has never had one before.  She will also need an EGD due to her complaint of intermittent dysphasia She reports a 10 pound weight loss over the last 1-2 months, however documented weights do not reflect this   However, prior to colonoscopy and EGD she will need cardiac evaluation and workup by her primary care provider. She states she is been having intermittent chest pain, does not have any at this time, is not related to exertion.  She is seeing her primary care doctor tomorrow for this.  Will await their clearance prior to EGD and colonoscopy.  She was asked to contact our office after discussing this with her primary care provider.  If procedures will need to be delayed due to cardiac workup, or if she is not cleared for the procedures, CT abdomen and pelvis can be considered instead.  In the meantime, she was asked to continue MiraLAX as she is reporting hard stools over the last week. Patient educated extensively on acid reflux lifestyle modification, not eating 3 hrs before bedtime as well.   Will follow up in clinic to assess symptoms and timing or procedures etc.    Dr [name] [name]

## 2017-11-15 ENCOUNTER — Ambulatory Visit (INDEPENDENT_AMBULATORY_CARE_PROVIDER_SITE_OTHER): Payer: Medicare HMO

## 2017-11-15 DIAGNOSIS — M810 Age-related osteoporosis without current pathological fracture: Secondary | ICD-10-CM

## 2017-11-15 MED ORDER — DENOSUMAB 60 MG/ML ~~LOC~~ SOLN
60.0000 mg | Freq: Once | SUBCUTANEOUS | Status: AC
  Administered 2017-11-15: 60 mg via SUBCUTANEOUS

## 2017-11-22 ENCOUNTER — Encounter: Payer: Self-pay | Admitting: Family Medicine

## 2017-11-22 ENCOUNTER — Ambulatory Visit (INDEPENDENT_AMBULATORY_CARE_PROVIDER_SITE_OTHER): Payer: Medicare HMO | Admitting: Family Medicine

## 2017-11-22 VITALS — BP 136/68 | HR 77 | Temp 97.4°F | Ht 63.0 in | Wt 113.2 lb

## 2017-11-22 DIAGNOSIS — I10 Essential (primary) hypertension: Secondary | ICD-10-CM | POA: Diagnosis not present

## 2017-11-22 DIAGNOSIS — R0789 Other chest pain: Secondary | ICD-10-CM | POA: Diagnosis not present

## 2017-11-22 DIAGNOSIS — R079 Chest pain, unspecified: Secondary | ICD-10-CM | POA: Insufficient documentation

## 2017-11-22 NOTE — Progress Notes (Signed)
Subjective:    Patient ID: [name] [name], female    DOB: 14-Mar-1946, 72 y.o.   MRN: [id number]  HPI Here for concerns of heart problems   Pt saw GI 3/11 for loose stool and nausea and abd cramping   Also noted that she had exp intermittent cp (non exertional)  GI recommended she get this checked out before planning a colonoscopy or EGD  Wt Readings from Last 3 Encounters:  11/22/17 113 lb 4 oz (51.4 kg)  11/14/17 116 lb (52.6 kg)  11/01/17 112 lb (50.8 kg)   20.06 kg/m   Former smoker -quit in 09 Post menopausal  Takes atorvastatin for cholesterol- 80 mg  Known aortic calcification on imaging   bp is stable today  No cp or palpitations or headaches or edema  No side effects to medicines  BP Readings from Last 3 Encounters:  11/22/17 136/68  11/14/17 (!) 176/73  11/01/17 126/74    Takes lisinopril for this    EKG today is NSR with rate of 74 and no acute changes   Her chest discomfort comes 2 times per week avg  Always at rest  Sharp pain - L side of chest  Fleeting- lasting only a few seconds /sometimes she gets "swimmy headed" with it  Feels need to take a deep breath when it happens  No palpitations at all  Unsure if related to change of position  No pain to take a deep breath   No sob on exertion  No cp on exertion  No pressure  No cough  Chronic back pain - not assoc with cp   Last cxr 3/18 CLINICAL DATA:  Smoker with productive cough for the past 3 weeks. COPD.  EXAM: CHEST  2 VIEW  COMPARISON:  06/04/2016.  FINDINGS: Normal sized heart. Tortuous and calcified thoracic aorta. Hyperexpanded lungs with stable diffuse peribronchial thickening and accentuation of the interstitial markings. Stable mid to lower thoracic vertebral compression deformity. Diffuse osteopenia.  IMPRESSION: 1. No acute abnormality. 2. Stable changes of COPD and chronic bronchitis. 3. Aortic atherosclerosis.   Electronically Signed   By: Claudie Revering M.D.  On: 11/21/2016 10:40   BP Readings from Last 3 Encounters:  11/22/17 136/68  11/14/17 (!) 176/73  11/01/17 126/74   Patient Active Problem List   Diagnosis Date Noted  . Atypical chest pain 11/22/2017  . Generalized abdominal pain 11/01/2017  . Diarrhea 11/01/2017  . Colon cancer screening 11/01/2017  . Estrogen deficiency 09/26/2017  . Leg pain, bilateral 09/26/2017  . Aortic calcification ([place]) 11/30/2016  . History of patellar fracture 11/30/2016  . Routine general medical examination at a health care facility 06/27/2016  . Abdominal bloating 06/15/2016  . Dyspepsia 06/04/2016  . Loss of weight 06/04/2016  . History of vertebral compression fracture 01/19/2016  . Chronic foot pain 10/27/2011  . Low back pain 04/14/2011  . FEVER BLISTER 11/17/2010  . Essential hypertension 08/18/2009  . BUNION 03/04/2009  . Allergic rhinitis 01/10/2008  . TOBACCO USE, QUIT 02/10/2007  . H/O goiter 02/09/2007  . Hypothyroidism 02/09/2007  . HYPERCHOLESTEROLEMIA 02/09/2007  . DEPRESSION 02/09/2007  . OSTEOARTHRITIS 02/09/2007  . Osteoporosis 02/09/2007  . Disturbance in sleep behavior 02/09/2007   Past Medical History:  Diagnosis Date  . Depression   . Hypothyroidism   . Osteoarthritis   . Osteoporosis   . Shingles    arm 1/12  . Sleep disorder   . Tobacco abuse    past; quit 09   Past  Surgical History:  Procedure Laterality Date  . BIOPSY THYROID     pos neoplasm, surgery 09/2006  . bunions  06/1998  . CXR-copd, ts partial compression fracture  12/2006  . dexa  12/1996  . hashimoto  02/1997  . hyerectomy- cervical ca cells    . left wrist fracture     surgery  . right thyroid lobectomy, goiter, thyroiditis  12/2006  . stress fractures foot    . THYROIDECTOMY  11/2009   Social History   Tobacco Use  . Smoking status: Former Research scientist (life sciences)  . Smokeless tobacco: Never Used  . Tobacco comment: Quit 10/09  Substance Use Topics  . Alcohol use: No    Alcohol/week: 0.0 oz  . Drug  use: No   Family History  Problem Relation Age of Onset  . Stroke Father   . Hypertension Father   . Diabetes Mother   . Coronary artery disease Mother    Allergies  Allergen Reactions  . Evista [Raloxifene Hcl]     Leg pain and cramps   . Fosamax [Alendronate Sodium]     Dysphagia and heartburn    Current Outpatient Medications on File Prior to Visit  Medication Sig Dispense Refill  . albuterol (PROVENTIL HFA;VENTOLIN HFA) 108 (90 BASE) MCG/ACT inhaler Inhale 2 puffs into the lungs every 4 (four) hours as needed for wheezing. 1 Inhaler 11  . atorvastatin (LIPITOR) 80 MG tablet Take 1 tablet (80 mg total) by mouth daily. 90 tablet 3  . benzonatate (TESSALON) 200 MG capsule Take 1 capsule (200 mg total) by mouth 3 (three) times daily as needed. Swallow whole, do not bite pill 30 capsule 1  . Calcium Carbonate-Vit D-Min (CALCIUM 1200 PO) Take by mouth daily.      Marland Kitchen levothyroxine (SYNTHROID, LEVOTHROID) 100 MCG tablet TAKE 1 TABLET EVERY DAY BEFORE BREAKFAST 90 tablet 3  . lisinopril (PRINIVIL,ZESTRIL) 10 MG tablet Take 0.5 tablets (5 mg total) by mouth daily. 45 tablet 3  . loratadine (CLARITIN) 10 MG tablet Take 1 tablet (10 mg total) by mouth daily.    . mupirocin ointment (BACTROBAN) 2 % Apply to affected area twice daily 15 g 1  . nortriptyline (PAMELOR) 25 MG capsule TAKE 1 CAPSULE AT BEDTIME WITH A 75MG  CAPSULE 90 capsule 3  . nortriptyline (PAMELOR) 75 MG capsule Take 1 capsule (75 mg total) by mouth at bedtime. 90 capsule 3  . temazepam (RESTORIL) 30 MG capsule TAKE 1 CAPSULE AT BEDTIME 90 capsule 1   No current facility-administered medications on file prior to visit.     Review of Systems  Constitutional: Negative for activity change, appetite change, fatigue, fever and unexpected weight change.  HENT: Negative for congestion, ear pain, rhinorrhea, sinus pressure and sore throat.   Eyes: Negative for pain, redness and visual disturbance.  Respiratory: Negative for cough,  shortness of breath and wheezing.   Cardiovascular: Positive for chest pain. Negative for palpitations and leg swelling.  Gastrointestinal: Positive for diarrhea and nausea. Negative for abdominal pain, blood in stool and constipation.       Undergoing w/u for her GI symptoms  Endocrine: Negative for polydipsia and polyuria.  Genitourinary: Negative for dysuria, frequency and urgency.  Musculoskeletal: Positive for back pain. Negative for arthralgias and myalgias.  Skin: Negative for pallor and rash.  Allergic/Immunologic: Negative for environmental allergies.  Neurological: Negative for dizziness, syncope and headaches.  Hematological: Negative for adenopathy. Does not bruise/bleed easily.  Psychiatric/Behavioral: Negative for decreased concentration and dysphoric mood. The patient  is not nervous/anxious.        Objective:   Physical Exam  Constitutional: She appears well-developed and well-nourished. No distress.  underwt and well appearing   HENT:  Head: Normocephalic and atraumatic.  Mouth/Throat: Oropharynx is clear and moist.  Eyes: Conjunctivae and EOM are normal. Pupils are equal, round, and reactive to light.  Neck: Normal range of motion. Neck supple. No JVD present. Carotid bruit is not present. No thyromegaly present.  Cardiovascular: Normal rate, regular rhythm, normal heart sounds and intact distal pulses. Exam reveals no gallop.  Pulmonary/Chest: Effort normal and breath sounds normal. No respiratory distress. She has no wheezes. She has no rales. She exhibits tenderness.  No crackles  Some bilateral chest wall tenderness posteriorly  Abdominal: Soft. Bowel sounds are normal. She exhibits no distension, no abdominal bruit, no pulsatile midline mass and no mass. There is no tenderness.  Musculoskeletal: She exhibits no edema or tenderness.  No palp cords or tenderness of LEs   Lymphadenopathy:    She has no cervical adenopathy.  Neurological: She is alert. She has  normal reflexes. No cranial nerve deficit. She exhibits normal muscle tone. Coordination normal.  Skin: Skin is warm and dry. No rash noted. No erythema. No pallor.  Psychiatric: She has a normal mood and affect.          Assessment & Plan:   Problem List Items Addressed This Visit      Cardiovascular and Mediastinum   Essential hypertension    bp in fair control at this time  BP Readings from Last 1 Encounters:  11/22/17 136/68   No changes needed Disc lifstyle change with low sodium diet and exercise          Other   Atypical chest pain    Several episodes of cp at rest- fleeting  Atypical  She does have vascular risk factors with past hx of smoking and lipids and HTN and age  GI would like to do colonoscopy/egd -not until she is cleared from a cardiac perspective  Will ref to cardiology  Re assuring ekg and exam       Relevant Orders   Ambulatory referral to Cardiology    Other Visit Diagnoses    Chest pain, unspecified type    -  Primary   Relevant Orders   EKG 12-Lead (Completed)

## 2017-11-22 NOTE — Patient Instructions (Addendum)
EKG is re assuring  Blood pressure is controlled  We will refer you to cardiology to discuss your chest pain and possible screening for heart disease   If your symptoms return and persist /worsen please alert [place] and get to the ER

## 2017-11-23 NOTE — Assessment & Plan Note (Signed)
Several episodes of cp at rest- fleeting  Atypical  She does have vascular risk factors with past hx of smoking and lipids and HTN and age  GI would like to do colonoscopy/egd -not until she is cleared from a cardiac perspective  Will ref to cardiology  Re assuring ekg and exam

## 2017-11-23 NOTE — Progress Notes (Signed)
Cardiology Office Note  Date:  11/25/2017   ID:  [name] [name], DOB Apr 15, 1946, MRN [id number]  PCP:  Abner Greenspan, MD   Chief Complaint  Patient presents with  . New Patient (Initial Visit)    Referred by [facility] for atypical chest pain that comes and goes. Meds reviewed verbally with patient.     HPI:  [name] is a 72 year old woman with history of Former smoker -quit in 09 Known aortic calcification on imaging /CT scan COPD Chronic back pain  Referred by Dr. Glori Bickers for consultation of her intermittent chest pain  She reports having long history of chest pain symptoms  2 types of chest pain,  First type has lots of burning, acid in her throat.  Happens sporadically, takes Tums symptoms persist,  This typically happens more than once a week Symptoms can present at rest or with stress  Also reports having occasional burning in her chest when she exerts herself Comes and goes  Long history of smoking, hyperlipidemia Total cholesterol 230, reports she is taking Lipitor 80 daily  CT chest  2015 Images reviewed with her in detail Moderate aortic atherosclerosis particularly in the arch Very mild if any in the carotids Mild to moderate in the distal descending aorta, iliacs   at baseline is typically active in her garden  does her ADLs, shopping  Problems with insomnia  not sleeping, always tired Dragging in the daytime   EKG personally reviewed by myself on todays visit Shows normal sinus rhythm rate 74 bpm no significant ST or T wave changes EKG dated November 22, 2017  PMH:   has a past medical history of Depression, Hypothyroidism, Osteoarthritis, Osteoporosis, Shingles, Sleep disorder, and Tobacco abuse.  PSH:    Past Surgical History:  Procedure Laterality Date  . BIOPSY THYROID     pos neoplasm, surgery 09/2006  . bunions  06/1998  . CXR-copd, ts partial compression fracture  12/2006  . dexa  12/1996  . hashimoto  02/1997  . hyerectomy- cervical ca cells     . left wrist fracture     surgery  . right thyroid lobectomy, goiter, thyroiditis  12/2006  . stress fractures foot    . THYROIDECTOMY  11/2009    Current Outpatient Medications  Medication Sig Dispense Refill  . albuterol (PROVENTIL HFA;VENTOLIN HFA) 108 (90 BASE) MCG/ACT inhaler Inhale 2 puffs into the lungs every 4 (four) hours as needed for wheezing. 1 Inhaler 11  . atorvastatin (LIPITOR) 80 MG tablet Take 1 tablet (80 mg total) by mouth daily. 90 tablet 3  . benzonatate (TESSALON) 200 MG capsule Take 1 capsule (200 mg total) by mouth 3 (three) times daily as needed. Swallow whole, do not bite pill 30 capsule 1  . Calcium Carbonate-Vit D-Min (CALCIUM 1200 PO) Take by mouth daily.      Marland Kitchen levothyroxine (SYNTHROID, LEVOTHROID) 100 MCG tablet TAKE 1 TABLET EVERY DAY BEFORE BREAKFAST 90 tablet 3  . lisinopril (PRINIVIL,ZESTRIL) 10 MG tablet Take 0.5 tablets (5 mg total) by mouth daily. 45 tablet 3  . loratadine (CLARITIN) 10 MG tablet Take 1 tablet (10 mg total) by mouth daily.    . mupirocin ointment (BACTROBAN) 2 % Apply to affected area twice daily 15 g 1  . nortriptyline (PAMELOR) 25 MG capsule TAKE 1 CAPSULE AT BEDTIME WITH A 75MG  CAPSULE 90 capsule 3  . nortriptyline (PAMELOR) 75 MG capsule Take 1 capsule (75 mg total) by mouth at bedtime. 90 capsule 3  .  temazepam (RESTORIL) 30 MG capsule TAKE 1 CAPSULE AT BEDTIME 90 capsule 1  . omeprazole (PRILOSEC) 20 MG capsule Take 1 capsule (20 mg total) by mouth 2 (two) times daily before a meal. 60 capsule 1   No current facility-administered medications for this visit.      Allergies:   Evista [raloxifene hcl] and Fosamax [alendronate sodium]   Social History:  The patient  reports that she has quit smoking. She has never used smokeless tobacco. She reports that she does not drink alcohol or use drugs.   Family History:   family history includes Coronary artery disease in her mother; Diabetes in her mother; Hypertension in her father;  Stroke in her father.    Review of Systems: Review of Systems  Constitutional: Negative.   Respiratory: Negative.   Cardiovascular: Positive for chest pain.  Gastrointestinal: Positive for heartburn.  Musculoskeletal: Negative.   Neurological: Negative.   Psychiatric/Behavioral: Negative.   All other systems reviewed and are negative.    PHYSICAL EXAM: VS:  BP (!) 144/72 (BP Location: Right Arm, Patient Position: Sitting, Cuff Size: Normal)   Pulse 62   Ht 5' 3.5" (1.613 m)   Wt 113 lb 4 oz (51.4 kg)   BMI 19.75 kg/m  , BMI Body mass index is 19.75 kg/m. GEN: Well nourished, well developed, in no acute distress  HEENT: normal  Neck: no JVD, carotid bruits, or masses Cardiac: RRR; no murmurs, rubs, or gallops,no edema  Respiratory:  clear to auscultation bilaterally, normal work of breathing GI: soft, nontender, nondistended, + BS MS: no deformity or atrophy  Skin: warm and dry, no rash Neuro:  Strength and sensation are intact Psych: euthymic mood, full affect    Recent Labs: 09/22/2017: TSH 2.12 11/01/2017: ALT 10; BUN 9; Creatinine, Ser 0.71; Hemoglobin 14.5; Platelets 251.0; Sodium 141 11/08/2017: Potassium 4.6    Lipid Panel Lab Results  Component Value Date   CHOL 240 (H) 09/22/2017   HDL 78.00 09/22/2017   LDLCALC 148 (H) 09/22/2017   TRIG 73.0 09/22/2017      Wt Readings from Last 3 Encounters:  11/25/17 113 lb 4 oz (51.4 kg)  11/22/17 113 lb 4 oz (51.4 kg)  11/14/17 116 lb (52.6 kg)       ASSESSMENT AND PLAN:  Aortic calcification (HCC) Images reviewed with her in detail Reports that she stopped smoking 10 years ago Reports she is compliant with her Lipitor 80 daily  Surprisingly cholesterol continues to run high  Hyperlipidemia Recommend we confirm she is taking Lipitor 80 daily, she reports compliance For further lipid management we could add Zetia 10 mg daily  Chest pain, unspecified type - Plan: NM Myocar Multi W/Spect W/Wall Motion /  EF Some typical and atypical features Coronary calcifications seen on CT scan, significant aortic atherosclerosis Recommend pharmacological Myoview to rule out ischemia  Coronary artery calcification seen on CAT scan Calcification noted in the LAD Stress test as above  TOBACCO USE, QUIT Reports that she quit in 2009  Dyspepsia Significant acid and burning in her throat Only taking Tums Recommended she try omeprazole once a day, Twice a day if no improvement on daily dosing If symptoms persist might need EGD She reports years of acid In her throat  Essential hypertension Blood pressure borderline elevated, will monitor for now Recommended she check blood pressure at home  Disposition:   F/U  As needed   Total encounter time more than 60 minutes  Greater than 50% was spent in counseling  and coordination of care with the patient    Orders Placed This Encounter  Procedures  . NM Myocar Multi W/Spect W/Wall Motion / EF     Signed, Esmond Plants, M.D., Ph.D. 11/25/2017  [place], [place]

## 2017-11-23 NOTE — Assessment & Plan Note (Signed)
bp in fair control at this time  BP Readings from Last 1 Encounters:  11/22/17 136/68   No changes needed Disc lifstyle change with low sodium diet and exercise

## 2017-11-25 ENCOUNTER — Encounter: Payer: Self-pay | Admitting: Cardiovascular Disease

## 2017-11-25 ENCOUNTER — Ambulatory Visit (INDEPENDENT_AMBULATORY_CARE_PROVIDER_SITE_OTHER): Payer: Medicare HMO | Admitting: Cardiovascular Disease

## 2017-11-25 VITALS — BP 144/72 | HR 62 | Ht 63.5 in | Wt 113.2 lb

## 2017-11-25 DIAGNOSIS — I2510 Atherosclerotic heart disease of native coronary artery without angina pectoris: Secondary | ICD-10-CM | POA: Diagnosis not present

## 2017-11-25 DIAGNOSIS — R1013 Epigastric pain: Secondary | ICD-10-CM

## 2017-11-25 DIAGNOSIS — I10 Essential (primary) hypertension: Secondary | ICD-10-CM

## 2017-11-25 DIAGNOSIS — Z87891 Personal history of nicotine dependence: Secondary | ICD-10-CM | POA: Diagnosis not present

## 2017-11-25 DIAGNOSIS — R079 Chest pain, unspecified: Secondary | ICD-10-CM | POA: Diagnosis not present

## 2017-11-25 DIAGNOSIS — I700 Atherosclerosis of aorta: Secondary | ICD-10-CM

## 2017-11-25 MED ORDER — OMEPRAZOLE 20 MG PO CPDR: 20.0000 mg | DELAYED_RELEASE_CAPSULE | Freq: Two times a day (BID) | ORAL | 1 refills | Status: DC

## 2017-11-25 NOTE — Patient Instructions (Addendum)
Medication Instructions:   Please start omeprazole one a day  Labwork:  No new labs needed  Testing/Procedures:  We will schedule a lexiscan myoview for chest  pain and CAD on CT scan  [facility] MYOVIEW  Your caregiver has ordered a Stress Test with nuclear imaging. The purpose of this test is to evaluate the blood supply to your heart muscle. This procedure is referred to as a "Non-Invasive Stress Test." This is because other than having an IV started in your vein, nothing is inserted or "invades" your body. Cardiac stress tests are done to find areas of poor blood flow to the heart by determining the extent of coronary artery disease (CAD). Some patients exercise on a treadmill, which naturally increases the blood flow to your heart, while others who are  unable to walk on a treadmill due to physical limitations have a pharmacologic/chemical stress agent called Lexiscan . This medicine will mimic walking on a treadmill by temporarily increasing your coronary blood flow.   Please note: these test may take anywhere between 2-4 hours to complete  PLEASE REPORT TO [place] AT THE FIRST DESK WILL DIRECT YOU WHERE TO GO  Date of Procedure:_____________________________________  Arrival Time for Procedure:______________________________  Instructions regarding medication:   You may take your morning medications with a sip of water.   PLEASE NOTIFY THE OFFICE AT LEAST 86 HOURS IN ADVANCE IF YOU ARE UNABLE TO KEEP YOUR APPOINTMENT.  [phone] AND  PLEASE NOTIFY NUCLEAR MEDICINE AT [facility] AT LEAST 24 HOURS IN ADVANCE IF YOU ARE UNABLE TO KEEP YOUR APPOINTMENT. [phone]  How to prepare for your Myoview test:  1. Do not eat or drink after midnight 2. No caffeine for 24 hours prior to test 3. No smoking 24 hours prior to test. 4. Your medication may be taken with water.  If your doctor stopped a medication because of this test, do not take that  medication. 5. Ladies, please do not wear dresses.  Skirts or pants are appropriate. Please wear a short sleeve shirt. 6. No perfume, cologne or lotion. 7. Wear comfortable walking shoes. No heels!          Your physician has requested that you have a lexiscan myoview. For further information please visit [email or web address]. Please follow instruction sheet, as given.     Follow-Up: It was a pleasure seeing you in the office today. Please call us if you have new issues that need to be addressed before your next appt.  [phone]  Your physician wants you to follow-up in: as needed You will receive a reminder letter in the mail two months in advance. If you don't receive a letter, please call our office to schedule the follow-up appointment.  If you need a refill on your cardiac medications before your next appointment, please call your pharmacy.  For educational health videos Log in to : [email or web address] Or : [email or web address], password : [email or web address] Cardiac Nuclear Scan A cardiac nuclear scan is a test that measures blood flow to the heart when a person is resting and when he or she is exercising. The test looks for problems such as:  Not enough blood reaching a portion of the heart.  The heart muscle not working normally.  You may need this test if:  You have heart disease.  You have had abnormal lab results.  You have had heart surgery or angioplasty.  You have chest pain.  You have shortness of breath.  In this test, a  radioactive dye (tracer) is injected into your bloodstream. After the tracer has traveled to your heart, an imaging device is used to measure how much of the tracer is absorbed by or distributed to various areas of your heart. This procedure is usually done at a hospital and takes 2-4 hours. Tell a health care provider about:  Any allergies you have.  All medicines you are taking, including vitamins, herbs, eye drops, creams, and over-the-counter  medicines.  Any problems you or family members have had with the use of anesthetic medicines.  Any blood disorders you have.  Any surgeries you have had.  Any medical conditions you have.  Whether you are pregnant or may be pregnant. What are the risks? Generally, this is a safe procedure. However, problems may occur, including:  Serious chest pain and heart attack. This is only a risk if the stress portion of the test is done.  Rapid heartbeat.  Sensation of warmth in your chest. This usually passes quickly.  What happens before the procedure?  Ask your health care provider about changing or stopping your regular medicines. This is especially important if you are taking diabetes medicines or blood thinners.  Remove your jewelry on the day of the procedure. What happens during the procedure?  An IV tube will be inserted into one of your veins.  Your health care provider will inject a small amount of radioactive tracer through the tube.  You will wait for 20-40 minutes while the tracer travels through your bloodstream.  Your heart activity will be monitored with an electrocardiogram (ECG).  You will lie down on an exam table.  Images of your heart will be taken for about 15-20 minutes.  You may be asked to exercise on a treadmill or stationary bike. While you exercise, your heart's activity will be monitored with an ECG, and your blood pressure will be checked. If you are unable to exercise, you may be given a medicine to increase blood flow to parts of your heart.  When blood flow to your heart has peaked, a tracer will again be injected through the IV tube.  After 20-40 minutes, you will get back on the exam table and have more images taken of your heart.  When the procedure is over, your IV tube will be removed. The procedure may vary among health care providers and hospitals. Depending on the type of tracer used, scans may need to be repeated 3-4 hours later. What  happens after the procedure?  Unless your health care provider tells you otherwise, you may return to your normal schedule, including diet, activities, and medicines.  Unless your health care provider tells you otherwise, you may increase your fluid intake. This will help flush the contrast dye from your body. Drink enough fluid to keep your urine clear or pale yellow.  It is up to you to get your test results. Ask your health care provider, or the department that is doing the test, when your results will be ready. Summary  A cardiac nuclear scan measures the blood flow to the heart when a person is resting and when he or she is exercising.  You may need this test if you are at risk for heart disease.  Tell your health care provider if you are pregnant.  Unless your health care provider tells you otherwise, increase your fluid intake. This will help flush the contrast dye from your body. Drink enough fluid to keep your urine clear or pale yellow. This information is  not intended to replace advice given to you by your health care provider. Make sure you discuss any questions you have with your health care provider. Document Released: 09/17/2004 Document Revised: 08/25/2016 Document Reviewed: 08/01/2013 Elsevier Interactive Patient Education  2017 [employer].

## 2017-12-01 ENCOUNTER — Encounter
Admission: RE | Admit: 2017-12-01 | Discharge: 2017-12-01 | Disposition: A | Discharge status: Home / Self Care | Payer: Medicare HMO | Source: Ambulatory Visit | Attending: Cardiovascular Disease | Admitting: Cardiovascular Disease

## 2017-12-01 DIAGNOSIS — R079 Chest pain, unspecified: Secondary | ICD-10-CM | POA: Diagnosis not present

## 2017-12-01 LAB — NM MYOCAR MULTI W/SPECT W/WALL MOTION / EF
LV dias vol: 59 mL (ref 46–106)
LV sys vol: 22 mL
Peak HR: 100 {beats}/min
Percent HR: 67 %
Rest HR: 64 {beats}/min
SDS: 0
SRS: 4
SSS: 2
TID: 0.84

## 2017-12-01 MED ORDER — TECHNETIUM TC 99M TETROFOSMIN IV KIT
30.6700 | PACK | Freq: Once | INTRAVENOUS | Status: AC | PRN
  Administered 2017-12-01: 30.67 via INTRAVENOUS

## 2017-12-01 MED ORDER — REGADENOSON 0.4 MG/5ML IV SOLN
0.4000 mg | Freq: Once | INTRAVENOUS | Status: AC
  Administered 2017-12-01: 0.4 mg via INTRAVENOUS

## 2017-12-01 MED ORDER — TECHNETIUM TC 99M TETROFOSMIN IV KIT
12.9700 | PACK | Freq: Once | INTRAVENOUS | Status: AC | PRN
  Administered 2017-12-01: 12.97 via INTRAVENOUS

## 2017-12-05 ENCOUNTER — Telehealth: Payer: Self-pay | Admitting: Family Medicine

## 2017-12-05 NOTE — Telephone Encounter (Signed)
Copied from [id number] [id number]. Topic: Quick Communication - Other Results >> Dec 05, 2017  4:08 PM Hamilton Marinello, [place], [place] wrote: Patient calling because she would like to get the results of her stress test. If someone could give her a call back about this at [phone]

## 2017-12-06 NOTE — Telephone Encounter (Signed)
I do not see the reading in the chart yet but will forward to Dr Rockey Situ

## 2017-12-06 NOTE — Telephone Encounter (Signed)
Pt notified that someone at cardiologist office will call regarding results

## 2017-12-07 NOTE — Progress Notes (Signed)
Cardiology Office Note  Date:  12/08/2017   ID:  [name] [name], DOB 11/17/1945, MRN [id number]  PCP:  Abner Greenspan, MD   Chief Complaint  Patient presents with  . other    Follow up from stress test. Meds reviewed by the pt. verbally. Pt. c/o shortness of breath and weakness.     HPI:  [name] is a 72 year old woman with history of Former smoker -quit in 09 COPD Chronic back pain  Aortic atherosclerosis on CT Who presents for follow-up discussion of her intermittent chest pain and to go over results of her stress test   Stress test December 01, 2017 Details discussed with her in detail, Attenuation corrected images with ischemia noted mid to distal anterior wall Significant artifact on non-attenuation corrected images  She reports that she continues to have chest pain symptoms, most recently several days ago.  Concerned it could be from blockages Symptoms at rest as well as with exertion, sometimes described as a burning   Long history of smoking, hyperlipidemia  Total cholesterol 230, reports  On Lipitor 80 daily  CT chest  2015 Moderate aortic atherosclerosis particularly in the arch Very mild if any in the carotids Mild to moderate in the distal descending aorta, iliacs    PMH:   has a past medical history of Depression, Hypothyroidism, Osteoarthritis, Osteoporosis, Shingles, Sleep disorder, and Tobacco abuse.  PSH:    Past Surgical History:  Procedure Laterality Date  . BIOPSY THYROID     pos neoplasm, surgery 09/2006  . bunions  06/1998  . CXR-copd, ts partial compression fracture  12/2006  . dexa  12/1996  . hashimoto  02/1997  . hyerectomy- cervical ca cells    . left wrist fracture     surgery  . right thyroid lobectomy, goiter, thyroiditis  12/2006  . stress fractures foot    . THYROIDECTOMY  11/2009    Current Outpatient Medications  Medication Sig Dispense Refill  . albuterol (PROVENTIL HFA;VENTOLIN HFA) 108 (90 BASE) MCG/ACT inhaler Inhale 2  puffs into the lungs every 4 (four) hours as needed for wheezing. 1 Inhaler 11  . atorvastatin (LIPITOR) 80 MG tablet Take 1 tablet (80 mg total) by mouth daily. 90 tablet 3  . benzonatate (TESSALON) 200 MG capsule Take 1 capsule (200 mg total) by mouth 3 (three) times daily as needed. Swallow whole, do not bite pill 30 capsule 1  . Calcium Carbonate-Vit D-Min (CALCIUM 1200 PO) Take by mouth daily.      Marland Kitchen levothyroxine (SYNTHROID, LEVOTHROID) 100 MCG tablet TAKE 1 TABLET EVERY DAY BEFORE BREAKFAST 90 tablet 3  . lisinopril (PRINIVIL,ZESTRIL) 10 MG tablet Take 0.5 tablets (5 mg total) by mouth daily. 45 tablet 3  . loratadine (CLARITIN) 10 MG tablet Take 1 tablet (10 mg total) by mouth daily.    . mupirocin ointment (BACTROBAN) 2 % Apply to affected area twice daily 15 g 1  . nortriptyline (PAMELOR) 25 MG capsule TAKE 1 CAPSULE AT BEDTIME WITH A 75MG  CAPSULE 90 capsule 3  . nortriptyline (PAMELOR) 75 MG capsule Take 1 capsule (75 mg total) by mouth at bedtime. 90 capsule 3  . omeprazole (PRILOSEC) 20 MG capsule Take 1 capsule (20 mg total) by mouth 2 (two) times daily before a meal. 60 capsule 1  . temazepam (RESTORIL) 30 MG capsule TAKE 1 CAPSULE AT BEDTIME 90 capsule 1  . nitroGLYCERIN (NITROSTAT) 0.4 MG SL tablet Place 1 tablet (0.4 mg total) under the tongue every 5 (  five) minutes as needed for chest pain. 25 tablet 3   No current facility-administered medications for this visit.      Allergies:   Evista [raloxifene hcl] and Fosamax [alendronate sodium]   Social History:  The patient  reports that she has quit smoking. She has never used smokeless tobacco. She reports that she does not drink alcohol or use drugs.   Family History:   family history includes Coronary artery disease in her mother; Diabetes in her mother; Hypertension in her father; Stroke in her father.    Review of Systems: Review of Systems  Constitutional: Negative.   Respiratory: Negative.   Cardiovascular: Positive  for chest pain.  Gastrointestinal: Positive for heartburn.  Musculoskeletal: Negative.   Neurological: Negative.   Psychiatric/Behavioral: Negative.   All other systems reviewed and are negative.    PHYSICAL EXAM: VS:  BP 110/72 (BP Location: Left Arm, Patient Position: Sitting, Cuff Size: Normal)   Pulse 68   Ht 5' 3.5" (1.613 m)   Wt 112 lb 12 oz (51.1 kg)   BMI 19.66 kg/m  , BMI Body mass index is 19.66 kg/m. Constitutional:  oriented to person, place, and time. No distress.  HENT:  Head: Normocephalic and atraumatic.  Eyes:  no discharge. No scleral icterus.  Neck: Normal range of motion. Neck supple. No JVD present.  Cardiovascular: Normal rate, regular rhythm, normal heart sounds and intact distal pulses. Exam reveals no gallop and no friction rub. No edema No murmur heard. Pulmonary/Chest: [place] normal and breath sounds normal. No stridor. No respiratory distress.  no wheezes.  no rales.  no tenderness.  Abdominal: Soft.  no distension.  no tenderness.  Musculoskeletal: Normal range of motion.  no  tenderness or deformity.  Neurological:  normal muscle tone. Coordination normal. No atrophy Skin: Skin is warm and dry. No rash noted. not diaphoretic.  Psychiatric:  normal mood and affect. behavior is normal. Thought content normal.   Recent Labs: 09/22/2017: TSH 2.12 11/01/2017: ALT 10; BUN 9; Creatinine, Ser 0.71; Hemoglobin 14.5; Platelets 251.0; Sodium 141 11/08/2017: Potassium 4.6    Lipid Panel Lab Results  Component Value Date   CHOL 240 (H) 09/22/2017   HDL 78.00 09/22/2017   LDLCALC 148 (H) 09/22/2017   TRIG 73.0 09/22/2017      Wt Readings from Last 3 Encounters:  12/08/17 112 lb 12 oz (51.1 kg)  11/25/17 113 lb 4 oz (51.4 kg)  11/22/17 113 lb 4 oz (51.4 kg)       ASSESSMENT AND PLAN:  Aortic calcification (HCC)  stopped smoking 10 years ago on Lipitor 80 daily  We will likely need to add Zetia  Hyperlipidemia Consider adding Zetia 10 mg  daily  Chest pain, unspecified type -  Stress test concerning for ischemia anterior wall Discussed various treatment options with her including cardiac catheterization, cardiac CTA in [facility] or medical management She prefers cardiac catheterization I have reviewed the risks, indications, and alternatives to cardiac catheterization, possible angioplasty, and stenting with the patient. Risks include but are not limited to bleeding, infection, vascular injury, stroke, myocardial infection, arrhythmia, kidney injury, radiation-related injury in the case of prolonged fluoroscopy use, emergency cardiac surgery, and death. The patient understands the risks of serious complication is 1-2 in 5643 with diagnostic cardiac cath and 1-2% or less with angioplasty/stenting.  We will call her to schedule, tentatively given her several dates Daughter will give her a ride Labs have been ordered, chest x-ray ordered  TOBACCO USE, QUIT Reports that  she quit in 2009  Dyspepsia Recommended she try omeprazole once a day,  Essential hypertension Blood pressure is well controlled on today's visit. No changes made to the medications.  Disposition:   F/U  1 mon   Total encounter time more than 45 minutes  Greater than 50% was spent in counseling and coordination of care with the patient    Orders Placed This Encounter  Procedures  . DG Chest 2 View  . CBC with Differential/Platelet  . Basic metabolic panel  . INR/PT     Signed, Esmond Plants, M.D., Ph.D. 12/08/2017  [place], [place]

## 2017-12-08 ENCOUNTER — Encounter: Payer: Self-pay | Admitting: Cardiovascular Disease

## 2017-12-08 ENCOUNTER — Telehealth: Payer: Self-pay | Admitting: Cardiovascular Disease

## 2017-12-08 ENCOUNTER — Ambulatory Visit (INDEPENDENT_AMBULATORY_CARE_PROVIDER_SITE_OTHER): Payer: Medicare HMO | Admitting: Cardiovascular Disease

## 2017-12-08 VITALS — BP 110/72 | HR 68 | Ht 63.5 in | Wt 112.8 lb

## 2017-12-08 DIAGNOSIS — I10 Essential (primary) hypertension: Secondary | ICD-10-CM | POA: Diagnosis not present

## 2017-12-08 DIAGNOSIS — Z87891 Personal history of nicotine dependence: Secondary | ICD-10-CM | POA: Diagnosis not present

## 2017-12-08 DIAGNOSIS — R079 Chest pain, unspecified: Secondary | ICD-10-CM

## 2017-12-08 DIAGNOSIS — E7800 Pure hypercholesterolemia, unspecified: Secondary | ICD-10-CM | POA: Diagnosis not present

## 2017-12-08 DIAGNOSIS — I2510 Atherosclerotic heart disease of native coronary artery without angina pectoris: Secondary | ICD-10-CM

## 2017-12-08 DIAGNOSIS — I700 Atherosclerosis of aorta: Secondary | ICD-10-CM

## 2017-12-08 MED ORDER — NITROGLYCERIN 0.4 MG SL SUBL: 0.4000 mg | SUBLINGUAL_TABLET | SUBLINGUAL | 3 refills | Status: DC | PRN

## 2017-12-08 NOTE — Telephone Encounter (Signed)
Patient calling to talk to [facility] about setting up a procedure appt

## 2017-12-08 NOTE — Patient Instructions (Addendum)
Medication Instructions:   Please start asa 81 mg daily  NTG as needed under the tongue  Labwork:  When you call we will tell you when to go for your labs. Chest xray will also need to be done.  Testing/Procedures:  No further testing at this time  Dates that we could schedule this test would be April 10, 12, 18, or 19.    [facility] Cardiac Cath Instructions   You are scheduled for a Cardiac Cath on:_________________________  Please arrive at _______am on the day of your procedure  Please expect a call from our [place] to pre-register you  Do not eat/drink anything after midnight  Someone will need to drive you home  It is recommended someone be with you for the first 24 hours after your procedure  Wear clothes that are easy to get on/off and wear slip on shoes if possible   Medications bring a current list of all medications with you  _X__ You may take all of your medications the morning of your procedure with enough water to swallow safely    Day of your procedure: Arrive at the [facility] entrance.  Free valet service is available.  After entering the [facility] please check-in at the registration desk (1st desk on your right) to receive your armband. After receiving your armband someone will escort you to the cardiac cath/special procedures waiting area.  The usual length of stay after your procedure is about 2 to 3 hours.  This can vary.  If you have any questions, please call our office at [phone], or you may call the cardiac cath lab at [facility] directly at [phone]    Follow-Up: It was a pleasure seeing you in the office today. Please call us if you have new issues that need to be addressed before your next appt.  [phone]  Your physician wants you to follow-up in: 1 month.    If you need a refill on your cardiac medications before your next appointment, please call your pharmacy.  For educational health videos Log in  to : [email or web address] Or : [email or web address], password : [email or web address]  Coronary Angiogram With Stent Coronary angiogram with stent placement is a procedure to widen or open a narrow blood vessel of the heart (coronary artery). Arteries may become blocked by cholesterol buildup (plaques) in the lining of the wall. When a coronary artery becomes partially blocked, blood flow to that area decreases. This may lead to chest pain or a heart attack (myocardial infarction). A stent is a small piece of metal that looks like mesh or a spring. Stent placement may be done as treatment for a heart attack or right after a coronary angiogram in which a blocked artery is found. Let your health care provider know about:  Any allergies you have.  All medicines you are taking, including vitamins, herbs, eye drops, creams, and over-the-counter medicines.  Any problems you or family members have had with anesthetic medicines.  Any blood disorders you have.  Any surgeries you have had.  Any medical conditions you have.  Whether you are pregnant or may be pregnant. What are the risks? Generally, this is a safe procedure. However, problems may occur, including:  Damage to the heart or its blood vessels.  A return of blockage.  Bleeding, infection, or bruising at the insertion site.  A collection of blood under the skin (hematoma) at the insertion site.  A blood clot in another part of the body.  Kidney injury.  Allergic reaction to the dye or contrast that is used.  Bleeding into the abdomen (retroperitoneal bleeding).  What happens before the procedure? Staying hydrated Follow instructions from your health care provider about hydration, which may include:  Up to 2 hours before the procedure - you may continue to drink clear liquids, such as water, clear fruit juice, black coffee, and plain tea.  Eating and drinking restrictions Follow instructions from your health care provider about eating and  drinking, which may include:  8 hours before the procedure - stop eating heavy meals or foods such as meat, fried foods, or fatty foods.  6 hours before the procedure - stop eating light meals or foods, such as toast or cereal.  2 hours before the procedure - stop drinking clear liquids.  Ask your health care provider about:  Changing or stopping your regular medicines. This is especially important if you are taking diabetes medicines or blood thinners.  Taking medicines such as ibuprofen. These medicines can thin your blood. Do not take these medicines before your procedure if your health care provider instructs you not to. Generally, aspirin is recommended before a procedure of passing a small, thin tube (catheter) through a blood vessel and into the heart (cardiac catheterization).  What happens during the procedure?  An IV tube will be inserted into one of your veins.  You will be given one or more of the following: ? A medicine to help you relax (sedative). ? A medicine to numb the area where the catheter will be inserted into an artery (local anesthetic).  To reduce your risk of infection: ? Your health care team will wash or sanitize their hands. ? Your skin will be washed with soap. ? Hair may be removed from the area where the catheter will be inserted.  Using a guide wire, the catheter will be inserted into an artery. The location may be in your groin, in your wrist, or in the fold of your arm (near your elbow).  A type of X-ray (fluoroscopy) will be used to help guide the catheter to the opening of the arteries in the heart.  A dye will be injected into the catheter, and X-rays will be taken. The dye will help to show where any narrowing or blockages are located in the arteries.  A tiny wire will be guided to the blocked spot, and a balloon will be inflated to make the artery wider.  The stent will be expanded and will crush the plaques into the wall of the vessel. The  stent will hold the area open and improve the blood flow. Most stents have a drug coating to reduce the risk of the stent narrowing over time.  The artery may be made wider using a drill, laser, or other tools to remove plaques.  When the blood flow is better, the catheter will be removed. The lining of the artery will grow over the stent, which stays where it was placed. This procedure may vary among health care providers and hospitals. What happens after the procedure?  If the procedure is done through the leg, you will be kept in bed lying flat for about 6 hours. You will be instructed to not bend and not cross your legs.  The insertion site will be checked frequently.  The pulse in your foot or wrist will be checked frequently.  You may have additional blood tests, X-rays, and a test that records the electrical activity of your heart (electrocardiogram, or ECG). This information  is not intended to replace advice given to you by your health care provider. Make sure you discuss any questions you have with your health care provider. Document Released: 02/27/2003 Document Revised: 04/22/2016 Document Reviewed: 03/28/2016 Elsevier Interactive Patient Education  [employer].

## 2017-12-08 NOTE — Telephone Encounter (Signed)
Called patient back.  She is able to do Left heart cath on 12/23/17 at 0730 am. She is [facility]  SHe will go sometime next week (probably on Wednesday 4/10) for lab work and chest xray. Patient verbalized understanding of pre-procedural instructions as listed on AVS when we reviewed them. Message left with [facility] scheduling for procedure. Message sent to Pre-cert.

## 2017-12-09 NOTE — Telephone Encounter (Signed)
Thank you so much

## 2017-12-09 NOTE — Telephone Encounter (Signed)
Stress test showed ischemia but with significant attenuation artifact difficult study to interpret.  After long discussion she has elected for cardiac catheterization .  High risk given long history of smoking.  Scheduled for April 19 thx TG

## 2017-12-09 NOTE — Telephone Encounter (Signed)
Received message from scheduling that patient has been scheduled for left heart cath.

## 2017-12-14 ENCOUNTER — Other Ambulatory Visit
Admission: RE | Admit: 2017-12-14 | Discharge: 2017-12-14 | Disposition: A | Discharge status: Home / Self Care | Payer: Medicare HMO | Source: Ambulatory Visit | Attending: Cardiovascular Disease | Admitting: Cardiovascular Disease

## 2017-12-14 DIAGNOSIS — R079 Chest pain, unspecified: Secondary | ICD-10-CM

## 2017-12-14 DIAGNOSIS — I2510 Atherosclerotic heart disease of native coronary artery without angina pectoris: Secondary | ICD-10-CM | POA: Diagnosis not present

## 2017-12-14 DIAGNOSIS — E7800 Pure hypercholesterolemia, unspecified: Secondary | ICD-10-CM | POA: Diagnosis not present

## 2017-12-14 LAB — BASIC METABOLIC PANEL
Anion gap: 5 (ref 5–15)
BUN: 10 mg/dL (ref 6–20)
CO2: 30 mmol/L (ref 22–32)
Calcium: 8.6 mg/dL — ABNORMAL LOW (ref 8.9–10.3)
Chloride: 106 mmol/L (ref 101–111)
Creatinine, Ser: 0.62 mg/dL (ref 0.44–1.00)
GFR calc Af Amer: >60 mL/min (ref >60)
GFR calc non Af Amer: >60 mL/min (ref >60)
Glucose, Bld: 93 mg/dL (ref 65–99)
Potassium: 3.8 mmol/L (ref 3.5–5.1)
Sodium: 141 mmol/L (ref 135–145)

## 2017-12-14 LAB — CBC WITH DIFFERENTIAL/PLATELET
Basophils Absolute: 0 10*3/uL (ref 0–0.1)
Basophils Relative: 1 %
Eosinophils Absolute: 0.1 10*3/uL (ref 0–0.7)
Eosinophils Relative: 2 %
HCT: 41.1 % (ref 35.0–47.0)
Hemoglobin: 13.8 g/dL (ref 12.0–16.0)
Lymphocytes Relative: 37 %
Lymphs Abs: 1.4 10*3/uL (ref 1.0–3.6)
MCH: 31.4 pg (ref 26.0–34.0)
MCHC: 33.6 g/dL (ref 32.0–36.0)
MCV: 93.4 fL (ref 80.0–100.0)
Monocytes Absolute: 0.3 10*3/uL (ref 0.2–0.9)
Monocytes Relative: 8 %
Neutro Abs: 2.1 10*3/uL (ref 1.4–6.5)
Neutrophils Relative %: 52 %
Platelets: 236 10*3/uL (ref 150–440)
RBC: 4.4 MIL/uL (ref 3.80–5.20)
RDW: 14.2 % (ref 11.5–14.5)
WBC: 3.9 10*3/uL (ref 3.6–11.0)

## 2017-12-14 LAB — PROTIME-INR
INR: 0.89
Prothrombin Time: 12 seconds (ref 11.4–15.2)

## 2017-12-17 ENCOUNTER — Other Ambulatory Visit: Payer: Self-pay | Admitting: Cardiovascular Disease

## 2017-12-17 DIAGNOSIS — I200 Unstable angina: Secondary | ICD-10-CM

## 2017-12-20 ENCOUNTER — Other Ambulatory Visit: Payer: Self-pay | Admitting: Cardiovascular Disease

## 2017-12-20 DIAGNOSIS — I200 Unstable angina: Secondary | ICD-10-CM

## 2017-12-22 ENCOUNTER — Telehealth: Payer: Self-pay | Admitting: Cardiovascular Disease

## 2017-12-22 ENCOUNTER — Telehealth: Payer: Self-pay | Admitting: *Deleted

## 2017-12-22 NOTE — Telephone Encounter (Signed)
Left voicemail message with instructions and information for her scheduled procedure for tomorrow.

## 2017-12-22 NOTE — Telephone Encounter (Signed)
No answer. Left detailed message, ok per DPR, with reminder to arrive at [place] at 0630am tomorrow morning, do not eat or drink after midnight, and ok to take medications with a small sip of water.

## 2017-12-23 ENCOUNTER — Encounter
Admission: RE | Disposition: A | Discharge status: Home / Self Care | Payer: Self-pay | Source: Ambulatory Visit | Attending: Cardiovascular Disease

## 2017-12-23 ENCOUNTER — Ambulatory Visit
Admission: RE | Admit: 2017-12-23 | Discharge: 2017-12-23 | Disposition: A | Discharge status: Home / Self Care | Payer: Medicare HMO | Source: Ambulatory Visit | Attending: Cardiovascular Disease | Admitting: Cardiovascular Disease

## 2017-12-23 DIAGNOSIS — I200 Unstable angina: Secondary | ICD-10-CM | POA: Diagnosis not present

## 2017-12-23 DIAGNOSIS — Z8249 Family history of ischemic heart disease and other diseases of the circulatory system: Secondary | ICD-10-CM | POA: Diagnosis not present

## 2017-12-23 DIAGNOSIS — R9439 Abnormal result of other cardiovascular function study: Secondary | ICD-10-CM | POA: Diagnosis present

## 2017-12-23 DIAGNOSIS — I25110 Atherosclerotic heart disease of native coronary artery with unstable angina pectoris: Secondary | ICD-10-CM | POA: Insufficient documentation

## 2017-12-23 DIAGNOSIS — Z823 Family history of stroke: Secondary | ICD-10-CM | POA: Diagnosis not present

## 2017-12-23 DIAGNOSIS — R0789 Other chest pain: Secondary | ICD-10-CM | POA: Insufficient documentation

## 2017-12-23 DIAGNOSIS — J449 Chronic obstructive pulmonary disease, unspecified: Secondary | ICD-10-CM | POA: Diagnosis not present

## 2017-12-23 DIAGNOSIS — Z79890 Hormone replacement therapy (postmenopausal): Secondary | ICD-10-CM | POA: Diagnosis not present

## 2017-12-23 DIAGNOSIS — E785 Hyperlipidemia, unspecified: Secondary | ICD-10-CM | POA: Insufficient documentation

## 2017-12-23 DIAGNOSIS — I700 Atherosclerosis of aorta: Secondary | ICD-10-CM | POA: Insufficient documentation

## 2017-12-23 DIAGNOSIS — Z87891 Personal history of nicotine dependence: Secondary | ICD-10-CM | POA: Diagnosis not present

## 2017-12-23 DIAGNOSIS — E079 Disorder of thyroid, unspecified: Secondary | ICD-10-CM | POA: Diagnosis not present

## 2017-12-23 DIAGNOSIS — M810 Age-related osteoporosis without current pathological fracture: Secondary | ICD-10-CM | POA: Insufficient documentation

## 2017-12-23 DIAGNOSIS — Z888 Allergy status to other drugs, medicaments and biological substances status: Secondary | ICD-10-CM | POA: Insufficient documentation

## 2017-12-23 DIAGNOSIS — I10 Essential (primary) hypertension: Secondary | ICD-10-CM | POA: Insufficient documentation

## 2017-12-23 DIAGNOSIS — I2510 Atherosclerotic heart disease of native coronary artery without angina pectoris: Secondary | ICD-10-CM | POA: Diagnosis present

## 2017-12-23 DIAGNOSIS — R079 Chest pain, unspecified: Secondary | ICD-10-CM

## 2017-12-23 HISTORY — PX: LEFT HEART CATH AND CORONARY ANGIOGRAPHY: CATH118249

## 2017-12-23 SURGERY — LEFT HEART CATH AND CORONARY ANGIOGRAPHY
Anesthesia: Moderate Sedation

## 2017-12-23 SURGERY — LEFT HEART CATH AND CORONARY ANGIOGRAPHY
Anesthesia: Moderate Sedation | Laterality: Bilateral

## 2017-12-23 SURGERY — LEFT HEART CATH AND CORONARY ANGIOGRAPHY
Anesthesia: Moderate Sedation | Laterality: Left

## 2017-12-23 MED ORDER — FENTANYL CITRATE (PF) 100 MCG/2ML IJ SOLN
INTRAMUSCULAR | Status: AC
  Filled 2017-12-23: qty 2

## 2017-12-23 MED ORDER — MIDAZOLAM HCL 2 MG/2ML IJ SOLN
INTRAMUSCULAR | Status: DC | PRN
  Administered 2017-12-23: 1.5 mg via INTRAVENOUS

## 2017-12-23 MED ORDER — SODIUM CHLORIDE 0.9% FLUSH: 3.0000 mL | Freq: Two times a day (BID) | INTRAVENOUS | Status: DC

## 2017-12-23 MED ORDER — FENTANYL CITRATE (PF) 100 MCG/2ML IJ SOLN
INTRAMUSCULAR | Status: DC | PRN
  Administered 2017-12-23: 37.5 ug via INTRAVENOUS

## 2017-12-23 MED ORDER — HEPARIN (PORCINE) IN NACL 1000-0.9 UT/500ML-% IV SOLN
INTRAVENOUS | Status: AC
  Filled 2017-12-23: qty 1000

## 2017-12-23 MED ORDER — SODIUM CHLORIDE 0.9 % WEIGHT BASED INFUSION
3.0000 mL/kg/h | INTRAVENOUS | Status: AC
  Administered 2017-12-23: 3 mL/kg/h via INTRAVENOUS

## 2017-12-23 MED ORDER — ACETAMINOPHEN 325 MG PO TABS: 650.0000 mg | ORAL_TABLET | ORAL | Status: DC | PRN

## 2017-12-23 MED ORDER — ASPIRIN 81 MG PO CHEW
CHEWABLE_TABLET | ORAL | Status: AC
  Filled 2017-12-23: qty 1

## 2017-12-23 MED ORDER — ASPIRIN 81 MG PO CHEW
81.0000 mg | CHEWABLE_TABLET | ORAL | Status: AC
  Administered 2017-12-23: 81 mg via ORAL

## 2017-12-23 MED ORDER — ONDANSETRON HCL 4 MG/2ML IJ SOLN: 4.0000 mg | Freq: Four times a day (QID) | INTRAMUSCULAR | Status: DC | PRN

## 2017-12-23 MED ORDER — SODIUM CHLORIDE 0.9 % IV SOLN: 250.0000 mL | INTRAVENOUS | Status: DC | PRN

## 2017-12-23 MED ORDER — MIDAZOLAM HCL 2 MG/2ML IJ SOLN
INTRAMUSCULAR | Status: AC
  Filled 2017-12-23: qty 2

## 2017-12-23 MED ORDER — SODIUM CHLORIDE 0.9 % WEIGHT BASED INFUSION: 1.0000 mL/kg/h | INTRAVENOUS | Status: DC

## 2017-12-23 MED ORDER — IOPAMIDOL (ISOVUE-300) INJECTION 61%
INTRAVENOUS | Status: DC | PRN
  Administered 2017-12-23: 100 mL via INTRA_ARTERIAL

## 2017-12-23 MED ORDER — SODIUM CHLORIDE 0.9% FLUSH: 3.0000 mL | INTRAVENOUS | Status: DC | PRN

## 2017-12-23 SURGICAL SUPPLY — 9 items
CATH INFINITI 5FR ANG PIGTAIL (CATHETERS) ×2 IMPLANT
CATH INFINITI 5FR JL4 (CATHETERS) ×2 IMPLANT
CATH INFINITI JR4 5F (CATHETERS) ×2 IMPLANT
DEVICE CLOSURE MYNXGRIP 5F (Vascular Products) ×2 IMPLANT
KIT MANI 3VAL PERCEP (MISCELLANEOUS) ×2 IMPLANT
NEEDLE PERC 18GX7CM (NEEDLE) ×2 IMPLANT
PACK CARDIAC CATH (CUSTOM PROCEDURE TRAY) ×2 IMPLANT
SHEATH AVANTI 5FR X 11CM (SHEATH) ×2 IMPLANT
WIRE GUIDERIGHT .035X150 (WIRE) ×2 IMPLANT

## 2017-12-23 NOTE — Discharge Instructions (Signed)
Angiogram  An angiogram, also called angiography, is a procedure used to look at the blood vessels. In this procedure, dye is injected through a long, thin tube (catheter) into an artery. X-rays are then taken. The X-rays will show if there is a blockage or problem in a blood vessel.  Tell a health care provider about:   Any allergies you have, including allergies to shellfish or contrast dye.   All medicines you are taking, including vitamins, herbs, eye drops, creams, and over-the-counter medicines.   Any problems you or family members have had with anesthetic medicines.   Any blood disorders you have.   Any surgeries you have had.   Any previous kidney problems or failure you have had.   Any medical conditions you have.   Possibility of pregnancy, if this applies.  What are the risks?  Generally, an angiogram is a safe procedure. However, as with any procedure, problems can occur. Possible problems include:   Injury to the blood vessels, including rupture or bleeding.   Infection or bruising at the catheter site.   Allergic reaction to the dye or contrast used.   Kidney damage from the dye or contrast used.   Blood clots that can lead to a stroke or heart attack.    What happens before the procedure?   Do not eat or drink after midnight on the night before the procedure, or as directed by your health care provider.   Ask your health care provider if you may drink enough water to take any needed medicines the morning of the procedure.  What happens during the procedure?   You may be given a medicine to help you relax (sedative) before and during the procedure. This medicine is given through an IV access tube that is inserted into one of your veins.   The area where the catheter will be inserted will be washed and shaved. This is usually done in the groin but may be done in the fold of your arm (near your elbow) or in the wrist.   A medicine will be given to numb the area where the catheter will  be inserted (local anesthetic).   The catheter will be inserted with a guide wire into an artery. The catheter is guided by using a type of X-ray (fluoroscopy) to the blood vessel being examined.   Dye is then injected into the catheter, and X-rays are taken. The dye helps to show where any narrowing or blockages are located.  What happens after the procedure?   If the procedure is done through the leg, you will be kept in bed lying flat for several hours. You will be instructed to not bend or cross your legs.   The insertion site will be checked frequently.   The pulse in your feet or wrist will be checked frequently.   Additional blood tests, X-rays, and electrocardiography may be done.   You may need to stay in the hospital overnight for observation.  This information is not intended to replace advice given to you by your health care provider. Make sure you discuss any questions you have with your health care provider.  Document Released: 06/02/2005 Document Revised: 02/04/2016 Document Reviewed: 01/24/2013  Elsevier Interactive Patient Education  2017 Elsevier Inc.

## 2017-12-25 NOTE — H&P (Signed)
H&P Addendum, precardiac catheterization  Patient was seen and evaluated prior to Cardiac catheterization procedure Symptoms, prior testing details again confirmed with the patient Patient examined, no significant change from prior exam Lab work reviewed in detail personally by myself Patient understands risk and benefit of the procedure, willing to proceed  Signed, Tim Rasheem Figiel, MD, Ph.D [facility]    

## 2018-01-05 ENCOUNTER — Ambulatory Visit: Payer: Medicare HMO | Admitting: Cardiovascular Disease

## 2018-01-07 NOTE — Progress Notes (Signed)
Cardiology Office Note  Date:  01/09/2018   ID:  [name] [name], DOB 1945/10/05, MRN [id number]  PCP:  Abner Greenspan, MD   Chief Complaint  Patient presents with  . other    Follow up from cardiac cath. Meds reviewed by the pt. verbally. Pt. c/o shortness of breath.     HPI:  [name] is a 72 year old woman with history of Former smoker -quit in 09 Known aortic calcification on imaging /CT scan COPD Chronic back pain  Chronic chest pain Cardiac catheterization April 2019 with no significant coronary disease but she does have moderate diffuse calcification Presents for f/u of her chest pain, positive stress test, severe coronary calcification on CT scan,  cardiac catheterization  Cardiac catheterization December 23, 2017 No significant coronary disease noted, moderate diffuse calcification noted  Following the procedure we recommended Smoking cessation, Statin, Walking program,  Management of anxiety Monitoring of blood pressure  She denies any chest pain, active, overall feels well cardiac catheterization results discussed with her Tolerating Lipitor 80  active in her garden  does her ADLs, shopping  insomnia , not sleeping, always tired Dragging in the daytime  EKG personally reviewed by myself on todays visit Shows normal sinus rhythm rate 80 bpm no significant ST or T wave changes  Other past medical history reviewed Stress test 11/2017 significant GI uptake artifact noted Non-attenuation corrected images: no significant ischemia, significant GI uptake artifact Attenuation corrected images: Appearance of ischemia in the mid to distal anterior wall, apical region moderate in size and severity,  unable to exclude perfusion abnormality/ artifact from GI uptake Normal wall motion, EF estimated at 67% No EKG changes concerning for ischemia at peak stress or in recovery. PVCs noted Clinical correlation recommended, if indicated consider alternate study  She reports  having long history of chest pain symptoms  2 types of chest pain,  First type has lots of burning, acid in her throat.  Happens sporadically, takes Tums symptoms persist,  This typically happens more than once a week Symptoms can present at rest or with stress  Also reports having occasional burning in her chest when she exerts herself Comes and goes  Long history of smoking, hyperlipidemia Total cholesterol 230, reports she is taking Lipitor 80 daily  CT chest  2015 Images reviewed with her in detail Moderate aortic atherosclerosis particularly in the arch Very mild if any in the carotids Mild to moderate in the distal descending aorta, iliacs  PMH:   has a past medical history of Depression, Hypothyroidism, Osteoarthritis, Osteoporosis, Shingles, Sleep disorder, and Tobacco abuse.  PSH:    Past Surgical History:  Procedure Laterality Date  . BIOPSY THYROID     pos neoplasm, surgery 09/2006  . bunions  06/1998  . CXR-copd, ts partial compression fracture  12/2006  . dexa  12/1996  . hashimoto  02/1997  . hyerectomy- cervical ca cells    . LEFT HEART CATH AND CORONARY ANGIOGRAPHY Left 12/23/2017   Procedure: LEFT HEART CATH AND CORONARY ANGIOGRAPHY;  Surgeon: Minna Merritts, MD;  Location: [place] [facility];  Service: Cardiovascular;  Laterality: Left;  . left wrist fracture     surgery  . right thyroid lobectomy, goiter, thyroiditis  12/2006  . stress fractures foot    . THYROIDECTOMY  11/2009    Current Outpatient Medications  Medication Sig Dispense Refill  . albuterol (PROVENTIL HFA;VENTOLIN HFA) 108 (90 BASE) MCG/ACT inhaler Inhale 2 puffs into the lungs every 4 (four) hours  as needed for wheezing. 1 Inhaler 11  . atorvastatin (LIPITOR) 80 MG tablet Take 1 tablet (80 mg total) by mouth daily. 90 tablet 3  . Calcium Carbonate-Vit D-Min (CALCIUM 1200 PO) Take 1 tablet by mouth daily.     Marland Kitchen levothyroxine (SYNTHROID, LEVOTHROID) 100 MCG tablet TAKE 1 TABLET EVERY DAY  BEFORE BREAKFAST 90 tablet 3  . lisinopril (PRINIVIL,ZESTRIL) 10 MG tablet Take 0.5 tablets (5 mg total) by mouth daily. 45 tablet 3  . loratadine (CLARITIN) 10 MG tablet Take 1 tablet (10 mg total) by mouth daily.    . nitroGLYCERIN (NITROSTAT) 0.4 MG SL tablet Place 1 tablet (0.4 mg total) under the tongue every 5 (five) minutes as needed for chest pain. 25 tablet 3  . nortriptyline (PAMELOR) 25 MG capsule TAKE 1 CAPSULE AT BEDTIME WITH A 75MG  CAPSULE 90 capsule 3  . nortriptyline (PAMELOR) 75 MG capsule Take 1 capsule (75 mg total) by mouth at bedtime. 90 capsule 3  . omeprazole (PRILOSEC) 20 MG capsule Take 1 capsule (20 mg total) by mouth 2 (two) times daily before a meal. 60 capsule 1  . temazepam (RESTORIL) 30 MG capsule TAKE 1 CAPSULE AT BEDTIME 90 capsule 1  . ezetimibe (ZETIA) 10 MG tablet Take 1 tablet (10 mg total) by mouth daily. 90 tablet 3   No current facility-administered medications for this visit.      Allergies:   Evista [raloxifene hcl] and Fosamax [alendronate sodium]   Social History:  The patient  reports that she has quit smoking. She has never used smokeless tobacco. She reports that she does not drink alcohol or use drugs.   Family History:   family history includes Coronary artery disease in her mother; Diabetes in her mother; Hypertension in her father; Stroke in her father.    Review of Systems: Review of Systems  Constitutional: Negative.   Respiratory: Negative.   Cardiovascular: Positive for chest pain.  Gastrointestinal: Positive for heartburn.  Musculoskeletal: Negative.   Neurological: Negative.   Psychiatric/Behavioral: Negative.   All other systems reviewed and are negative.    PHYSICAL EXAM: VS:  BP 120/60 (BP Location: Left Arm, Patient Position: Sitting, Cuff Size: Normal)   Pulse 80   Ht 5' 3.5" (1.613 m)   Wt 112 lb 8 oz (51 kg)   BMI 19.62 kg/m  , BMI Body mass index is 19.62 kg/m. Constitutional:  oriented to person, place, and  time. No distress.  HENT:  Head: Normocephalic and atraumatic.  Eyes:  no discharge. No scleral icterus.  Neck: Normal range of motion. Neck supple. No JVD present.  Cardiovascular: Normal rate, regular rhythm, normal heart sounds and intact distal pulses. Exam reveals no gallop and no friction rub. No edema No murmur heard. Pulmonary/Chest: Effort normal and breath sounds normal. No stridor. No respiratory distress.  no wheezes.  no rales.  no tenderness.  Abdominal: Soft.  no distension.  no tenderness.  Musculoskeletal: Normal range of motion.  no  tenderness or deformity.  Neurological:  normal muscle tone. Coordination normal. No atrophy Skin: Skin is warm and dry. No rash noted. not diaphoretic.  Psychiatric:  normal mood and affect. behavior is normal. Thought content normal.    Recent Labs: 09/22/2017: TSH 2.12 11/01/2017: ALT 10 12/14/2017: BUN 10; Creatinine, Ser 0.62; Hemoglobin 13.8; Platelets 236; Potassium 3.8; Sodium 141    Lipid Panel Lab Results  Component Value Date   CHOL 240 (H) 09/22/2017   HDL 78.00 09/22/2017   LDLCALC 148 (H)  09/22/2017   TRIG 73.0 09/22/2017      Wt Readings from Last 3 Encounters:  01/09/18 112 lb 8 oz (51 kg)  12/23/17 112 lb (50.8 kg)  12/08/17 112 lb 12 oz (51.1 kg)       ASSESSMENT AND PLAN:  Aortic calcification (HCC) Recommended she continue on her Lipitor 80 and we will add Zetia 10 mg daily  Hyperlipidemia Continue Lipitor 80 daily,  add Zetia 10 mg daily  Chest pain, unspecified type -  Recent cardiac catheterization showing heavy  calcification with no significant stenosis We will continue aggressive lipid management  Coronary artery calcification seen on CAT scan Calcification noted in the LAD Catheterization results as above  TOBACCO USE, QUIT Reports that she quit in 2009 Denies shortness of breath on exertion  Dyspepsia Continue omeprazole  Essential hypertension Well-controlled on today's visit with  no medication changes made  Disposition:   F/U  12 months   Total encounter time more than 25 minutes  Greater than 50% was spent in counseling and coordination of care with the patient    Orders Placed This Encounter  Procedures  . EKG 12-Lead     Signed, Esmond Plants, M.D., Ph.D. 01/09/2018  [facility] [place], [place] [phone]

## 2018-01-09 ENCOUNTER — Encounter: Payer: Self-pay | Admitting: Cardiovascular Disease

## 2018-01-09 ENCOUNTER — Ambulatory Visit (INDEPENDENT_AMBULATORY_CARE_PROVIDER_SITE_OTHER): Payer: Medicare HMO | Admitting: Cardiovascular Disease

## 2018-01-09 VITALS — BP 120/60 | HR 80 | Ht 63.5 in | Wt 112.5 lb

## 2018-01-09 DIAGNOSIS — I10 Essential (primary) hypertension: Secondary | ICD-10-CM | POA: Diagnosis not present

## 2018-01-09 DIAGNOSIS — F17200 Nicotine dependence, unspecified, uncomplicated: Secondary | ICD-10-CM | POA: Diagnosis not present

## 2018-01-09 DIAGNOSIS — F419 Anxiety disorder, unspecified: Secondary | ICD-10-CM | POA: Diagnosis not present

## 2018-01-09 DIAGNOSIS — I2510 Atherosclerotic heart disease of native coronary artery without angina pectoris: Secondary | ICD-10-CM | POA: Diagnosis not present

## 2018-01-09 DIAGNOSIS — I700 Atherosclerosis of aorta: Secondary | ICD-10-CM | POA: Diagnosis not present

## 2018-01-09 DIAGNOSIS — E7800 Pure hypercholesterolemia, unspecified: Secondary | ICD-10-CM

## 2018-01-09 DIAGNOSIS — R079 Chest pain, unspecified: Secondary | ICD-10-CM | POA: Diagnosis not present

## 2018-01-09 MED ORDER — EZETIMIBE 10 MG PO TABS: 10.0000 mg | ORAL_TABLET | Freq: Every day | ORAL | 3 refills | Status: DC

## 2018-01-09 NOTE — Patient Instructions (Addendum)
Medication Instructions:   Please start zetia one a day  Labwork:  No new labs needed  Testing/Procedures:  No further testing at this time   Follow-Up: It was a pleasure seeing you in the office today. Please call us if you have new issues that need to be addressed before your next appt.  [phone]  Your physician wants you to follow-up in: 12 months.  You will receive a reminder letter in the mail two months in advance. If you don't receive a letter, please call our office to schedule the follow-up appointment.  If you need a refill on your cardiac medications before your next appointment, please call your pharmacy.  For educational health videos Log in to : [email or web address] Or : [email or web address], password : [email or web address]  

## 2018-01-19 ENCOUNTER — Encounter: Payer: Self-pay | Admitting: Emergency Medicine

## 2018-01-19 ENCOUNTER — Other Ambulatory Visit: Payer: Self-pay

## 2018-01-19 ENCOUNTER — Emergency Department
Admission: EM | Admit: 2018-01-19 | Discharge: 2018-01-19 | Disposition: A | Discharge status: Home / Self Care | Payer: Medicare HMO | Attending: Emergency Medicine | Admitting: Emergency Medicine

## 2018-01-19 ENCOUNTER — Emergency Department: Payer: Medicare HMO

## 2018-01-19 DIAGNOSIS — Y9301 Activity, walking, marching and hiking: Secondary | ICD-10-CM | POA: Insufficient documentation

## 2018-01-19 DIAGNOSIS — Z79899 Other long term (current) drug therapy: Secondary | ICD-10-CM | POA: Insufficient documentation

## 2018-01-19 DIAGNOSIS — E039 Hypothyroidism, unspecified: Secondary | ICD-10-CM | POA: Insufficient documentation

## 2018-01-19 DIAGNOSIS — M25551 Pain in right hip: Secondary | ICD-10-CM | POA: Diagnosis not present

## 2018-01-19 DIAGNOSIS — S32010A Wedge compression fracture of first lumbar vertebra, initial encounter for closed fracture: Secondary | ICD-10-CM | POA: Insufficient documentation

## 2018-01-19 DIAGNOSIS — Y92007 Garden or yard of unspecified non-institutional (private) residence as the place of occurrence of the external cause: Secondary | ICD-10-CM | POA: Diagnosis not present

## 2018-01-19 DIAGNOSIS — Y998 Other external cause status: Secondary | ICD-10-CM | POA: Diagnosis not present

## 2018-01-19 DIAGNOSIS — S34109A Unspecified injury to unspecified level of lumbar spinal cord, initial encounter: Secondary | ICD-10-CM | POA: Diagnosis present

## 2018-01-19 DIAGNOSIS — M545 Low back pain, unspecified: Secondary | ICD-10-CM

## 2018-01-19 DIAGNOSIS — S79911A Unspecified injury of right hip, initial encounter: Secondary | ICD-10-CM | POA: Diagnosis not present

## 2018-01-19 DIAGNOSIS — I10 Essential (primary) hypertension: Secondary | ICD-10-CM | POA: Insufficient documentation

## 2018-01-19 DIAGNOSIS — Z87891 Personal history of nicotine dependence: Secondary | ICD-10-CM | POA: Insufficient documentation

## 2018-01-19 DIAGNOSIS — W010XXA Fall on same level from slipping, tripping and stumbling without subsequent striking against object, initial encounter: Secondary | ICD-10-CM | POA: Insufficient documentation

## 2018-01-19 DIAGNOSIS — K5900 Constipation, unspecified: Secondary | ICD-10-CM | POA: Diagnosis not present

## 2018-01-19 LAB — URINALYSIS, COMPLETE (UACMP) WITH MICROSCOPIC
Bilirubin Urine: NEGATIVE
Glucose, UA: NEGATIVE mg/dL
Ketones, ur: 5 mg/dL — AB
Nitrite: NEGATIVE
Protein, ur: NEGATIVE mg/dL
Specific Gravity, Urine: 1.018 (ref 1.005–1.030)
pH: 5 (ref 5.0–8.0)

## 2018-01-19 MED ORDER — CELECOXIB 100 MG PO CAPS: 100.0000 mg | ORAL_CAPSULE | Freq: Every day | ORAL | 1 refills | Status: DC

## 2018-01-19 MED ORDER — CYCLOBENZAPRINE HCL 5 MG PO TABS: 5.0000 mg | ORAL_TABLET | Freq: Three times a day (TID) | ORAL | 0 refills | Status: DC | PRN

## 2018-01-19 MED ORDER — TRAMADOL HCL 50 MG PO TABS: 50.0000 mg | ORAL_TABLET | Freq: Three times a day (TID) | ORAL | 0 refills | Status: AC | PRN

## 2018-01-19 NOTE — Discharge Instructions (Signed)
Your exam and x-rays have revealed a new lumbar compression fracture. You also have indication of constipation. Increase your fiber intake and take OTC magnesium citrate to help induce a normal stool. You should take the prescription meds as directed. Be cautious with the pain medicine, as it can worsen constipation. Take Tylenol as needed for non-drowsy/non-narcotic pain relief during the day or when working. Take the muscle relaxant primarily at bedtime. Follow-up with [name] [name] as scheduled. See Dr. Glori Bickers for ongoing symptoms in the interim. Return to the ED for worsening back or abdominal pain.

## 2018-01-19 NOTE — ED Notes (Addendum)
Pt states she has been having back pain x 1 month with no improvement. Pt also states she has had some burning and discomfort with urination. Tender to palpation in flank region. Pt able to ambulate without difficulties.

## 2018-01-19 NOTE — ED Triage Notes (Signed)
Pt to ED via POV. Pt states that she twisted her back about 1 month ago and it has been hurting since then. Pt states that she has tried OTC medications without relief. Pt is in NAD at this time.

## 2018-01-19 NOTE — ED Provider Notes (Signed)
[facility] Emergency Department Provider Note ____________________________________________  Time seen: 1548  I have reviewed the triage vital signs and the nursing notes.  HISTORY  Chief Complaint  Back Pain  HPI [name] [name] is a 72 y.o. female presents herself to the ED for evaluation of a month of R>L lower back pain. She describes the pain as achy in nature. 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 She denies any referral, incontinence, or distal paresthesias. She does admit to a mechanical fall about a week earlier. She slipped on a loose stepping stone in the yard, and fell onto her buttocks. She sustained a bruise to the right hip. She denies that his fall has influenced or worsening her LBP. She has been taking Tylenol with limited benefit. She also complains of constipation, bloating, and flatuance for the last week. She has not dosed any medications to induce or improve her bowel symptoms. Her medical history is significant for remote T9 & L4 fractures.   Past Medical History:  Diagnosis Date  . Depression   . Hypothyroidism   . Osteoarthritis   . Osteoporosis   . Shingles    arm 1/12  . Sleep disorder   . Tobacco abuse    past; quit 09    Patient Active Problem List   Diagnosis Date Noted  . Smoker 01/09/2018  . Anxiety 01/09/2018  . Unstable angina ([place]) 12/23/2017  . Coronary artery calcification seen on CAT scan 11/25/2017  . Chest pain 11/22/2017  . Generalized abdominal pain 11/01/2017  . Diarrhea 11/01/2017  . Colon cancer screening 11/01/2017  . Estrogen deficiency 09/26/2017  . Leg pain, bilateral 09/26/2017  . Aortic calcification ([place]) 11/30/2016  . History of patellar fracture 11/30/2016  . Routine general medical examination at a health care facility 06/27/2016  . Abdominal bloating 06/15/2016  . Dyspepsia 06/04/2016  . Loss of weight 06/04/2016  . History of vertebral compression fracture  01/19/2016  . Chronic foot pain 10/27/2011  . Low back pain 04/14/2011  . FEVER BLISTER 11/17/2010  . Essential hypertension 08/18/2009  . BUNION 03/04/2009  . Allergic rhinitis 01/10/2008  . TOBACCO USE, QUIT 02/10/2007  . H/O goiter 02/09/2007  . Hypothyroidism 02/09/2007  . HYPERCHOLESTEROLEMIA 02/09/2007  . DEPRESSION 02/09/2007  . OSTEOARTHRITIS 02/09/2007  . Osteoporosis 02/09/2007  . Disturbance in sleep behavior 02/09/2007    Past Surgical History:  Procedure Laterality Date  . BIOPSY THYROID     pos neoplasm, surgery 09/2006  . bunions  06/1998  . CXR-copd, ts partial compression fracture  12/2006  . dexa  12/1996  . hashimoto  02/1997  . hyerectomy- cervical ca cells    . LEFT HEART CATH AND CORONARY ANGIOGRAPHY Left 12/23/2017   Procedure: LEFT HEART CATH AND CORONARY ANGIOGRAPHY;  Surgeon: Minna Merritts, MD;  Location: [place] [facility];  Service: Cardiovascular;  Laterality: Left;  . left wrist fracture     surgery  . right thyroid lobectomy, goiter, thyroiditis  12/2006  . stress fractures foot    . THYROIDECTOMY  11/2009    Prior to Admission medications   Medication Sig Start Date End Date Taking? Authorizing Provider  albuterol (PROVENTIL HFA;VENTOLIN HFA) 108 (90 BASE) MCG/ACT inhaler Inhale 2 puffs into the lungs every 4 (four) hours as needed for wheezing. 06/28/14   Tower, Wynelle Fanny, MD  atorvastatin (LIPITOR) 80 MG tablet Take 1 tablet (80 mg total) by mouth daily. 09/26/17   Tower, Wynelle Fanny,  MD  Calcium Carbonate-Vit D-Min (CALCIUM 1200 PO) Take 1 tablet by mouth daily.     [provider]  celecoxib (CELEBREX) 100 MG capsule Take 1 capsule (100 mg total) by mouth daily. 01/19/18 03/20/18  Tanekia Ryans, Dannielle Karvonen, PA-C  cyclobenzaprine (FLEXERIL) 5 MG tablet Take 1 tablet (5 mg total) by mouth 3 (three) times daily as needed for muscle spasms. 01/19/18   Carron Mcmurry, Dannielle Karvonen, PA-C  ezetimibe (ZETIA) 10 MG tablet Take 1 tablet (10 mg total) by  mouth daily. 01/09/18 04/09/18  Minna Merritts, MD  levothyroxine (SYNTHROID, LEVOTHROID) 100 MCG tablet TAKE 1 TABLET EVERY DAY BEFORE BREAKFAST 09/26/17   Tower, Wynelle Fanny, MD  lisinopril (PRINIVIL,ZESTRIL) 10 MG tablet Take 0.5 tablets (5 mg total) by mouth daily. 09/26/17   Tower, Wynelle Fanny, MD  loratadine (CLARITIN) 10 MG tablet Take 1 tablet (10 mg total) by mouth daily. 04/26/16   Tonia Ghent, MD  nitroGLYCERIN (NITROSTAT) 0.4 MG SL tablet Place 1 tablet (0.4 mg total) under the tongue every 5 (five) minutes as needed for chest pain. 12/08/17   Minna Merritts, MD  nortriptyline (PAMELOR) 25 MG capsule TAKE 1 CAPSULE AT BEDTIME WITH A 75MG  CAPSULE 09/26/17   Tower, Wynelle Fanny, MD  nortriptyline (PAMELOR) 75 MG capsule Take 1 capsule (75 mg total) by mouth at bedtime. 09/26/17   Tower, Wynelle Fanny, MD  omeprazole (PRILOSEC) 20 MG capsule Take 1 capsule (20 mg total) by mouth 2 (two) times daily before a meal. 11/25/17   Gollan, Kathlene November, MD  temazepam (RESTORIL) 30 MG capsule TAKE 1 CAPSULE AT BEDTIME 10/26/17   Tower, Wynelle Fanny, MD  traMADol (ULTRAM) 50 MG tablet Take 1 tablet (50 mg total) by mouth 3 (three) times daily as needed for up to 5 days for moderate pain (Dose primarily QHS when working). 01/19/18 01/24/18  Cleaven Demario, Dannielle Karvonen, PA-C    Allergies Evista [raloxifene hcl] and Fosamax [alendronate sodium]  Family History  Problem Relation Age of Onset  . Stroke Father   . Hypertension Father   . Diabetes Mother   . Coronary artery disease Mother     Social History Social History   Tobacco Use  . Smoking status: Former Research scientist (life sciences)  . Smokeless tobacco: Never Used  . Tobacco comment: Quit 10/09  Substance Use Topics  . Alcohol use: No    Alcohol/week: 0.0 oz  . Drug use: No    Review of Systems  Constitutional: Negative for fever. Cardiovascular: Negative for chest pain. Respiratory: Negative for shortness of breath. Gastrointestinal: Negative for abdominal pain, vomiting and  diarrhea.reports constipation. Genitourinary: Negative for dysuria. Musculoskeletal: Positivefor back pain. Skin: Negative for rash. Neurological: Negative for headaches, focal weakness or numbness. ____________________________________________  PHYSICAL EXAM:  VITAL SIGNS: ED Triage Vitals  Enc Vitals Group     BP 01/19/18 1527 (!) 159/67     Pulse Rate 01/19/18 1527 86     Resp 01/19/18 1527 16     Temp 01/19/18 1527 98.6 F (37 C)     Temp Source 01/19/18 1527 Oral     SpO2 01/19/18 1527 96 %     Weight 01/19/18 1528 112 lb (50.8 kg)     Height 01/19/18 1528 5' 3.5" (1.613 m)     Head Circumference --      Peak Flow --      Pain Score 01/19/18 1528 10     Pain Loc --      Pain Edu? --  Excl. in [place]? --     Constitutional: Alert and oriented. Well appearing and in no distress. Head: Normocephalic and atraumatic. Cardiovascular: Normal rate, regular rhythm. Normal distal pulses. Respiratory: Normal respiratory effort. No wheezes/rales/rhonchi. Gastrointestinal: Soft, distended and nontender.Hypoactive bowel sounds Musculoskeletal: Kyphotic deformity noted to the thoracic spine.  Normal spinal alignment without significant midline tenderness, spasm, deformity, or step-off.  Patient is mildly tender to palpation in the midline of the lumbar spine.  She is also with some tenderness to the bilateral lumbar sacral paraspinal musculature.  Nontender with normal range of motion in all extremities.  Neurologic:  Normal gait without ataxia. Normal speech and language. No gross focal neurologic deficits are appreciated. Skin:  Skin is warm, dry and intact. No rash noted. 5 cm diameter bruise to the lateral right buttock/hip. ____________________________________________   LABS (pertinent positives/negatives) Labs Reviewed  URINALYSIS, COMPLETE (UACMP) WITH MICROSCOPIC - Abnormal; Notable for the following components:      Result Value   Color, Urine AMBER (*)    APPearance CLEAR  (*)    Hgb urine dipstick MODERATE (*)    Ketones, ur 5 (*)    Leukocytes, UA SMALL (*)    Bacteria, UA RARE (*)    All other components within normal limits  ____________________________________________   RADIOLOGY  Lumbar Spine Complete  IMPRESSION: 1. New mild superior endplate compression fracture at L1. 2. Unchanged chronic L4 compression fracture. Moderate stool burden is also noted throughout the colon consistent with constipation.   Right Hip with Pelvis Negative ____________________________________________  INITIAL IMPRESSION / ASSESSMENT AND PLAN / ED COURSE  Geriatric patient with ED evaluation of 1 month complaint of low back pain that was recently aggravated by mechanical fall.  Her x-ray confirms a new mild superior endplate compression fracture at L1.  Patient's hip and pelvis x-rays are negative.  Her x-ray also reveals moderate stool burden, distant with the patient's complaints of constipation.  Patient will be discharged with prescriptions for Celebrex, Flexeril, and Ultram.  She will return to work with activities as tolerated and is cautioned about dosing the narcotic and muscle relaxant while working/driving.  She will follow-up with primary care provider as needed and also provider as scheduled.  Return precautions have been reviewed. ____________________________________________  FINAL CLINICAL IMPRESSION(S) / ED DIAGNOSES  Final diagnoses:  Acute right-sided low back pain without sciatica  Compression fracture of L1 lumbar vertebra, closed, initial encounter ([place])  Constipation, unspecified constipation type      Melvenia Needles, PA-C 01/19/18 [place], [place], MD 01/21/18 [phone]

## 2018-01-22 ENCOUNTER — Encounter: Payer: Self-pay | Admitting: Emergency Medicine

## 2018-01-22 ENCOUNTER — Other Ambulatory Visit: Payer: Self-pay

## 2018-01-22 ENCOUNTER — Emergency Department
Admission: EM | Admit: 2018-01-22 | Discharge: 2018-01-22 | Disposition: A | Discharge status: Home / Self Care | Payer: Medicare HMO | Attending: Emergency Medicine | Admitting: Emergency Medicine

## 2018-01-22 ENCOUNTER — Emergency Department: Payer: Medicare HMO

## 2018-01-22 DIAGNOSIS — Z87891 Personal history of nicotine dependence: Secondary | ICD-10-CM | POA: Diagnosis not present

## 2018-01-22 DIAGNOSIS — Z79899 Other long term (current) drug therapy: Secondary | ICD-10-CM | POA: Diagnosis not present

## 2018-01-22 DIAGNOSIS — E039 Hypothyroidism, unspecified: Secondary | ICD-10-CM | POA: Diagnosis not present

## 2018-01-22 DIAGNOSIS — R110 Nausea: Secondary | ICD-10-CM | POA: Diagnosis not present

## 2018-01-22 DIAGNOSIS — K5900 Constipation, unspecified: Secondary | ICD-10-CM | POA: Diagnosis not present

## 2018-01-22 MED ORDER — MAGNESIUM CITRATE PO SOLN
1.0000 | Freq: Once | ORAL | Status: AC
  Administered 2018-01-22: 1 via ORAL
  Filled 2018-01-22: qty 296

## 2018-01-22 MED ORDER — MAGNESIUM CITRATE PO SOLN: 1.0000 | Freq: Once | ORAL | 0 refills | Status: DC | PRN

## 2018-01-22 NOTE — ED Provider Notes (Signed)
[facility] Emergency Department Provider Note ____________________________________________   First MD Initiated Contact with Patient 01/22/18 1153     (approximate)  I have reviewed the triage vital signs and the nursing notes.   HISTORY  Chief Complaint Constipation    HPI [name] [name] is a 72 y.o. female PMH as noted below who presents with constipation over the last 2 weeks, persistent course, associated with some intermittent mild nausea but no vomiting, and not associated with abdominal pain.  Patient does report feeling bloated.  She states that she hurt her back and was started on pain medication and muscle relaxant last week, but the constipation had already started before then.  She has no prior history of constipation.  She states that she is passing gas.  Past Medical History:  Diagnosis Date  . Depression   . Hypothyroidism   . Osteoarthritis   . Osteoporosis   . Shingles    arm 1/12  . Sleep disorder   . Tobacco abuse    past; quit 09    Patient Active Problem List   Diagnosis Date Noted  . Smoker 01/09/2018  . Anxiety 01/09/2018  . Unstable angina ([place]) 12/23/2017  . Coronary artery calcification seen on CAT scan 11/25/2017  . Chest pain 11/22/2017  . Generalized abdominal pain 11/01/2017  . Diarrhea 11/01/2017  . Colon cancer screening 11/01/2017  . Estrogen deficiency 09/26/2017  . Leg pain, bilateral 09/26/2017  . Aortic calcification ([place]) 11/30/2016  . History of patellar fracture 11/30/2016  . Routine general medical examination at a health care facility 06/27/2016  . Abdominal bloating 06/15/2016  . Dyspepsia 06/04/2016  . Loss of weight 06/04/2016  . History of vertebral compression fracture 01/19/2016  . Chronic foot pain 10/27/2011  . Low back pain 04/14/2011  . FEVER BLISTER 11/17/2010  . Essential hypertension 08/18/2009  . BUNION 03/04/2009  . Allergic rhinitis 01/10/2008  . TOBACCO USE, QUIT 02/10/2007    . H/O goiter 02/09/2007  . Hypothyroidism 02/09/2007  . HYPERCHOLESTEROLEMIA 02/09/2007  . DEPRESSION 02/09/2007  . OSTEOARTHRITIS 02/09/2007  . Osteoporosis 02/09/2007  . Disturbance in sleep behavior 02/09/2007    Past Surgical History:  Procedure Laterality Date  . BIOPSY THYROID     pos neoplasm, surgery 09/2006  . bunions  06/1998  . CXR-copd, ts partial compression fracture  12/2006  . dexa  12/1996  . hashimoto  02/1997  . hyerectomy- cervical ca cells    . LEFT HEART CATH AND CORONARY ANGIOGRAPHY Left 12/23/2017   Procedure: LEFT HEART CATH AND CORONARY ANGIOGRAPHY;  Surgeon: Minna Merritts, MD;  Location: [place] [facility];  Service: Cardiovascular;  Laterality: Left;  . left wrist fracture     surgery  . right thyroid lobectomy, goiter, thyroiditis  12/2006  . stress fractures foot    . THYROIDECTOMY  11/2009    Prior to Admission medications   Medication Sig Start Date End Date Taking? Authorizing Provider  albuterol (PROVENTIL HFA;VENTOLIN HFA) 108 (90 BASE) MCG/ACT inhaler Inhale 2 puffs into the lungs every 4 (four) hours as needed for wheezing. 06/28/14   Tower, Wynelle Fanny, MD  atorvastatin (LIPITOR) 80 MG tablet Take 1 tablet (80 mg total) by mouth daily. 09/26/17   Tower, Wynelle Fanny, MD  Calcium Carbonate-Vit D-Min (CALCIUM 1200 PO) Take 1 tablet by mouth daily.     [provider]  celecoxib (CELEBREX) 100 MG capsule Take 1 capsule (100 mg total) by mouth daily. 01/19/18 03/20/18  Menshew,  Dannielle Karvonen, PA-C  cyclobenzaprine (FLEXERIL) 5 MG tablet Take 1 tablet (5 mg total) by mouth 3 (three) times daily as needed for muscle spasms. 01/19/18   Menshew, Dannielle Karvonen, PA-C  ezetimibe (ZETIA) 10 MG tablet Take 1 tablet (10 mg total) by mouth daily. 01/09/18 04/09/18  Minna Merritts, MD  levothyroxine (SYNTHROID, LEVOTHROID) 100 MCG tablet TAKE 1 TABLET EVERY DAY BEFORE BREAKFAST 09/26/17   Tower, Wynelle Fanny, MD  lisinopril (PRINIVIL,ZESTRIL) 10 MG tablet Take 0.5  tablets (5 mg total) by mouth daily. 09/26/17   Tower, Wynelle Fanny, MD  loratadine (CLARITIN) 10 MG tablet Take 1 tablet (10 mg total) by mouth daily. 04/26/16   Tonia Ghent, MD  nitroGLYCERIN (NITROSTAT) 0.4 MG SL tablet Place 1 tablet (0.4 mg total) under the tongue every 5 (five) minutes as needed for chest pain. 12/08/17   Minna Merritts, MD  nortriptyline (PAMELOR) 25 MG capsule TAKE 1 CAPSULE AT BEDTIME WITH A 75MG  CAPSULE 09/26/17   Tower, Wynelle Fanny, MD  nortriptyline (PAMELOR) 75 MG capsule Take 1 capsule (75 mg total) by mouth at bedtime. 09/26/17   Tower, Wynelle Fanny, MD  omeprazole (PRILOSEC) 20 MG capsule Take 1 capsule (20 mg total) by mouth 2 (two) times daily before a meal. 11/25/17   Gollan, Kathlene November, MD  temazepam (RESTORIL) 30 MG capsule TAKE 1 CAPSULE AT BEDTIME 10/26/17   Tower, Wynelle Fanny, MD  traMADol (ULTRAM) 50 MG tablet Take 1 tablet (50 mg total) by mouth 3 (three) times daily as needed for up to 5 days for moderate pain (Dose primarily QHS when working). 01/19/18 01/24/18  Menshew, Dannielle Karvonen, PA-C    Allergies Evista [raloxifene hcl] and Fosamax [alendronate sodium]  Family History  Problem Relation Age of Onset  . Stroke Father   . Hypertension Father   . Diabetes Mother   . Coronary artery disease Mother     Social History Social History   Tobacco Use  . Smoking status: Former Research scientist (life sciences)  . Smokeless tobacco: Never Used  . Tobacco comment: Quit 10/09  Substance Use Topics  . Alcohol use: No    Alcohol/week: 0.0 oz  . Drug use: No    Review of Systems  Constitutional: No fever/chills. Eyes: No redness. ENT: No sore throat. Cardiovascular: Denies chest pain. Respiratory: Denies shortness of breath. Gastrointestinal: Positive for nausea, no vomiting.  Genitourinary: Negative for dysuria or flank pain.  Musculoskeletal: Positive for back pain. Skin: Negative for rash. Neurological: Negative for  headache.   ____________________________________________   PHYSICAL EXAM:  VITAL SIGNS: ED Triage Vitals  Enc Vitals Group     BP 01/22/18 1052 136/67     Pulse Rate 01/22/18 1052 83     Resp 01/22/18 1052 18     Temp 01/22/18 1052 97.8 F (36.6 C)     Temp Source 01/22/18 1052 Oral     SpO2 01/22/18 1052 96 %     Weight 01/22/18 1052 112 lb (50.8 kg)     Height 01/22/18 1052 5' 3.5" (1.613 m)     Head Circumference --      Peak Flow --      Pain Score 01/22/18 1054 0     Pain Loc --      Pain Edu? --      Excl. in [place]? --     Constitutional: Alert and oriented. Well appearing and in no acute distress. Eyes: Conjunctivae are normal.  Head: Atraumatic. Nose: No congestion/rhinnorhea.  Mouth/Throat: Mucous membranes are moist.   Neck: Normal range of motion.  Cardiovascular:   Good peripheral circulation. Respiratory: Normal respiratory effort.  Gastrointestinal: Soft and nontender.  Mild distention.  No impacted stool on DRE. Genitourinary: No flank tenderness. Musculoskeletal: Extremities warm and well perfused.  Neurologic:  Normal speech and language. No gross focal neurologic deficits are appreciated.  Skin:  Skin is warm and dry. No rash noted. Psychiatric: Mood and affect are normal. Speech and behavior are normal.  ____________________________________________   LABS (all labs ordered are listed, but only abnormal results are displayed)  Labs Reviewed - No data to display ____________________________________________  EKG   ____________________________________________  RADIOLOGY  Normal x-ray: Stool throughout colon, with no evidence of obstruction.  ____________________________________________   PROCEDURES  Procedure(s) performed: No  Procedures  Critical Care performed: No ____________________________________________   INITIAL IMPRESSION / ASSESSMENT AND PLAN / ED COURSE  Pertinent labs & imaging results that were available during my care  of the patient were reviewed by me and considered in my medical decision making (see chart for details).  72 year old female with PMH as noted above presents with constipation over the last 2 weeks.  She has tried MiraLAX and stool softeners over-the-counter with no relief.  She reports a back strain several days ago and was started on Flexeril, Celebrex, and tramadol, however she had already been constipated before this.  On exam, the patient is well-appearing, vital signs are normal, abdomen is soft and nontender, and there is no impacted stool on rectal exam.  Presentation is consistent with simple constipation.  Will obtain an abdominal x-ray to rule out any signs of obstruction.  Anticipate discharge home with prescription for stronger laxative such as magnesium citrate.   ----------------------------------------- 1:03 PM on 01/22/2018 -----------------------------------------  X-ray shows stool but is otherwise unremarkable.  We will give magnesium citrate.  The patient is comfortable to go home.  Return precautions given, and she expresses understanding.  ____________________________________________   FINAL CLINICAL IMPRESSION(S) / ED DIAGNOSES  Final diagnoses:  Constipation, unspecified constipation type      NEW MEDICATIONS STARTED DURING THIS VISIT:  New Prescriptions   No medications on file     Note:  This document was prepared using Dragon voice recognition software and may include unintentional dictation errors.    Arta Silence, MD 01/22/18 1304

## 2018-01-22 NOTE — Discharge Instructions (Addendum)
Take the magnesium citrate that we gave you today.  If your symptoms persist you can take another bottle tomorrow.    Follow-up with your regular doctor within 1 to 2 weeks.    Return to the ER for new, worsening, persistent severe constipation, abdominal pain, blood in the stool, weakness, vomiting, or any other new or worsening symptoms that concern you.

## 2018-01-22 NOTE — ED Triage Notes (Signed)
Pt to ED via POV. Pt states that she has been constipated for 2 weeks. Pt states that she has tried OTC medication without relief. Pt states that she feels bloated. Pt states that she has nausea without vomiting. Pt is in NAD at this time.

## 2018-01-24 ENCOUNTER — Encounter: Payer: Self-pay | Admitting: Family Medicine

## 2018-01-24 ENCOUNTER — Ambulatory Visit (INDEPENDENT_AMBULATORY_CARE_PROVIDER_SITE_OTHER): Payer: Medicare HMO | Admitting: Family Medicine

## 2018-01-24 VITALS — BP 142/80 | HR 87 | Temp 98.3°F | Ht 63.5 in | Wt 110.5 lb

## 2018-01-24 DIAGNOSIS — K5903 Drug induced constipation: Secondary | ICD-10-CM

## 2018-01-24 DIAGNOSIS — S32010S Wedge compression fracture of first lumbar vertebra, sequela: Secondary | ICD-10-CM

## 2018-01-24 DIAGNOSIS — M8000XS Age-related osteoporosis with current pathological fracture, unspecified site, sequela: Secondary | ICD-10-CM

## 2018-01-24 DIAGNOSIS — K5900 Constipation, unspecified: Secondary | ICD-10-CM | POA: Insufficient documentation

## 2018-01-24 DIAGNOSIS — S32010A Wedge compression fracture of first lumbar vertebra, initial encounter for closed fracture: Secondary | ICD-10-CM | POA: Insufficient documentation

## 2018-01-24 MED ORDER — LACTULOSE 10 GM/15ML PO SOLN: ORAL | 0 refills | Status: DC

## 2018-01-24 NOTE — Progress Notes (Signed)
Subjective:    Patient ID: [name] [name], female    DOB: 1945-12-14, 72 y.o.   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  diag with new mild superior endplate compression fracture at L1 Noted unchanged chronic L4 comp fx as well  Constipation incidentally seen on xr as well  D/c on celebrex, flexeril and tramadol   Before all the medicine - had 2 bm per day  After medicine - no bm in a week   Has appt with ortho on 6/12 -- Dr Sallee Provencal   2nd ED visit was for constipation  Assoc with mild intermittent nausea but no vomiting or abd pain (more bloating)  Thought to be worse after starting pain medication for above spinal compression fx  She had tried miralax and stool softeners herself  No impaction on rectal exam  No signs of obstruction on xray  Was d/c for laxative- mag citrate (caused her to vomit but no bm)     No bm so far  At times some cramping  Still using some miralax -- twice daily   She is drinking lots of fluids Eating normally  Eating fruits and vegetables   Back pain is bad  Worse in R low back -she stopped taking tramadol Never took flexeril  Has not taken celebrex    Known hx of osteoporosis  Last dexa was 2/19 with lowest T score of -2.9 She has taken evista and miacalcin  Intolerant of oral bisphosphenate Had Prolia injection on 3/12    Results for orders placed or performed during the hospital encounter of 01/19/18  Urinalysis, Complete w Microscopic  Result Value Ref Range   Color, Urine AMBER (A) YELLOW   APPearance CLEAR (A) CLEAR   Specific Gravity, Urine 1.018 1.005 - 1.030   pH 5.0 5.0 - 8.0   Glucose, UA NEGATIVE NEGATIVE mg/dL   Hgb urine dipstick MODERATE (A) NEGATIVE   Bilirubin Urine NEGATIVE NEGATIVE   Ketones, ur 5 (A) NEGATIVE mg/dL   Protein, ur NEGATIVE NEGATIVE mg/dL   Nitrite NEGATIVE NEGATIVE   Leukocytes, UA SMALL (A) NEGATIVE   RBC / HPF 21-50 0 - 5 RBC/hpf   WBC, UA 0-5 0 - 5 WBC/hpf   Bacteria, UA RARE (A) NONE SEEN   Squamous Epithelial / LPF 0-5 0 - 5   Mucus PRESENT      Lab Results  Component Value Date   CREATININE 0.62 12/14/2017   BUN 10 12/14/2017   NA 141 12/14/2017   K 3.8 12/14/2017   CL 106 12/14/2017   CO2 30 12/14/2017   Lab Results  Component Value Date   WBC 3.9 12/14/2017   HGB 13.8 12/14/2017   HCT 41.1 12/14/2017   MCV 93.4 12/14/2017   PLT 236 12/14/2017   Lab Results  Component Value Date   ALT 10 11/01/2017   AST 16 11/01/2017   ALKPHOS 64 11/01/2017   BILITOT 0.4 11/01/2017    Lab Results  Component Value Date   TSH 2.12 09/22/2017     Dg Lumbar Spine Complete  Result Date: 01/19/2018 CLINICAL DATA:  Low back pain after fall 1 week ago. EXAM:  LUMBAR SPINE - COMPLETE 4+ VIEW COMPARISON:  Lumbar spine x-rays dated August 31, 2017. FINDINGS: Five lumbar type vertebral bodies. New mild superior endplate compression deformity at L1. Unchanged L4 compression fracture. Alignment is normal. Intervertebral disc spaces are maintained. Aortic atherosclerosis. IMPRESSION: 1. New mild superior endplate compression fracture at L1. 2. Unchanged chronic L4 compression fracture. Electronically Signed   By: Titus Dubin M.D.   On: 01/19/2018 17:07   Dg Abdomen 1 View  Result Date: 01/22/2018 CLINICAL DATA:  Constipation for 2 weeks EXAM: ABDOMEN - 1 VIEW COMPARISON:  None. FINDINGS: Moderate volume stool in the ascending, transverse and descending colon. Moderate volume stool in the rectum. No dilated loops of large or small bowel. IMPRESSION: Moderate volume stool throughout the colon similar to prior. Findings consistent with constipation. Electronically Signed   By: Suzy Bouchard M.D.   On: 01/22/2018  12:29   Dg Hip Unilat W Or Wo Pelvis 2-3 Views Right  Result Date: 01/19/2018 CLINICAL DATA:  Right hip pain after fall a week ago. EXAM: DG HIP (WITH OR WITHOUT PELVIS) 2-3V RIGHT COMPARISON:  None. FINDINGS: No acute fracture or dislocation. Hip joint spaces are relatively preserved. Diffuse osteopenia. Soft tissues are unremarkable. IMPRESSION: Negative. Electronically Signed   By: Titus Dubin M.D.   On: 01/19/2018 17:03     Patient Active Problem List   Diagnosis Date Noted  . Constipation 01/24/2018  . Compression fracture of L1 vertebra (HCC) 01/24/2018  . Smoker 01/09/2018  . Anxiety 01/09/2018  . Unstable angina ([place]) 12/23/2017  . Coronary artery calcification seen on CAT scan 11/25/2017  . Chest pain 11/22/2017  . Generalized abdominal pain 11/01/2017  . Diarrhea 11/01/2017  . Colon cancer screening 11/01/2017  . Estrogen deficiency 09/26/2017  . Leg pain, bilateral 09/26/2017  . Aortic calcification ([place]) 11/30/2016  . History of patellar fracture 11/30/2016  . Routine general medical examination at a health care facility 06/27/2016  . Abdominal bloating 06/15/2016  . Dyspepsia 06/04/2016  . Loss of weight 06/04/2016  . History of vertebral compression fracture 01/19/2016  . Chronic foot pain 10/27/2011  . Low back pain 04/14/2011  . FEVER BLISTER 11/17/2010  . Essential hypertension 08/18/2009  . BUNION 03/04/2009  . Allergic rhinitis 01/10/2008  . TOBACCO USE, QUIT 02/10/2007  . H/O goiter 02/09/2007  . Hypothyroidism 02/09/2007  . HYPERCHOLESTEROLEMIA 02/09/2007  . DEPRESSION 02/09/2007  . OSTEOARTHRITIS 02/09/2007  . Osteoporosis 02/09/2007  . Disturbance in sleep behavior 02/09/2007   Past Medical History:  Diagnosis Date  . Depression   . Hypothyroidism   . Osteoarthritis   . Osteoporosis   . Shingles    arm 1/12  . Sleep disorder   . Tobacco abuse    past; quit 09   Past Surgical History:  Procedure Laterality Date  . BIOPSY THYROID      pos neoplasm, surgery 09/2006  . bunions  06/1998  . CXR-copd, ts partial compression fracture  12/2006  . dexa  12/1996  . hashimoto  02/1997  . hyerectomy- cervical ca cells    . LEFT HEART CATH AND CORONARY ANGIOGRAPHY Left 12/23/2017   Procedure: LEFT HEART CATH AND CORONARY ANGIOGRAPHY;  Surgeon: Minna Merritts, MD;  Location: [facility];  Service: Cardiovascular;  Laterality: Left;  . left wrist fracture     surgery  . right thyroid lobectomy, goiter, thyroiditis  12/2006  . stress fractures foot    . THYROIDECTOMY  11/2009   Social History   Tobacco  Use  . Smoking status: Former Smoker  . Smokeless tobacco: Never Used  . Tobacco comment: Quit 10/09  Substance Use Topics  . Alcohol use: No    Alcohol/week: 0.0 oz  . Drug use: No   Family History  Problem Relation Age of Onset  . Stroke Father   . Hypertension Father   . Diabetes Mother   . Coronary artery disease Mother    Allergies  Allergen Reactions  . Evista [Raloxifene Hcl]     Leg pain and cramps   . Fosamax [Alendronate Sodium]     Dysphagia and heartburn    Current Outpatient Medications on File Prior to Visit  Medication Sig Dispense Refill  . albuterol (PROVENTIL HFA;VENTOLIN HFA) 108 (90 BASE) MCG/ACT inhaler Inhale 2 puffs into the lungs every 4 (four) hours as needed for wheezing. 1 Inhaler 11  . atorvastatin (LIPITOR) 80 MG tablet Take 1 tablet (80 mg total) by mouth daily. 90 tablet 3  . Calcium Carbonate-Vit D-Min (CALCIUM 1200 PO) Take 1 tablet by mouth daily.     Marland Kitchen ezetimibe (ZETIA) 10 MG tablet Take 1 tablet (10 mg total) by mouth daily. 90 tablet 3  . levothyroxine (SYNTHROID, LEVOTHROID) 100 MCG tablet TAKE 1 TABLET EVERY DAY BEFORE BREAKFAST 90 tablet 3  . lisinopril (PRINIVIL,ZESTRIL) 10 MG tablet Take 0.5 tablets (5 mg total) by mouth daily. 45 tablet 3  . loratadine (CLARITIN) 10 MG tablet Take 1 tablet (10 mg total) by mouth daily.    . magnesium citrate SOLN Take 296 mLs (1 Bottle  total) by mouth once as needed for up to 1 dose (Constipation). 296 mL 0  . nitroGLYCERIN (NITROSTAT) 0.4 MG SL tablet Place 1 tablet (0.4 mg total) under the tongue every 5 (five) minutes as needed for chest pain. 25 tablet 3  . nortriptyline (PAMELOR) 25 MG capsule TAKE 1 CAPSULE AT BEDTIME WITH A 75MG  CAPSULE 90 capsule 3  . nortriptyline (PAMELOR) 75 MG capsule Take 1 capsule (75 mg total) by mouth at bedtime. 90 capsule 3  . omeprazole (PRILOSEC) 20 MG capsule Take 1 capsule (20 mg total) by mouth 2 (two) times daily before a meal. 60 capsule 1  . temazepam (RESTORIL) 30 MG capsule TAKE 1 CAPSULE AT BEDTIME 90 capsule 1   No current facility-administered medications on file prior to visit.     Review of Systems  Constitutional: Positive for fatigue. Negative for activity change, appetite change, fever and unexpected weight change.  HENT: Negative for congestion, ear pain, rhinorrhea, sinus pressure and sore throat.   Eyes: Negative for pain, redness and visual disturbance.  Respiratory: Negative for cough, shortness of breath and wheezing.   Cardiovascular: Negative for chest pain and palpitations.  Gastrointestinal: Positive for constipation. Negative for abdominal pain, blood in stool, diarrhea, nausea, rectal pain and vomiting.       No further vomiting after trying mag citrate   Endocrine: Negative for polydipsia and polyuria.  Genitourinary: Negative for dysuria, frequency and urgency.  Musculoskeletal: Positive for back pain. Negative for arthralgias and myalgias.  Skin: Negative for pallor and rash.  Allergic/Immunologic: Negative for environmental allergies.  Neurological: Negative for dizziness, syncope and headaches.  Hematological: Negative for adenopathy. Does not bruise/bleed easily.  Psychiatric/Behavioral: Negative for decreased concentration and dysphoric mood. The patient is not nervous/anxious.        Objective:   Physical Exam  Constitutional: She appears  well-developed and well-nourished. No distress.  Underweight frail appearing elderly female   HENT:  Head: Normocephalic and atraumatic.  Mouth/Throat: Oropharynx is clear and moist.  Eyes: Pupils are equal, round, and reactive to light. Conjunctivae and EOM are normal.  Neck: Normal range of motion. Neck supple. No JVD present. Carotid bruit is not present. No thyromegaly present.  Thyroid scar baseline   Cardiovascular: Normal rate, regular rhythm, normal heart sounds and intact distal pulses. Exam reveals no gallop.  Pulmonary/Chest: Effort normal and breath sounds normal. No respiratory distress. She has no wheezes. She has no rales.  No crackles  Abdominal: Soft. Bowel sounds are normal. She exhibits no shifting dullness, no distension, no abdominal bruit, no pulsatile midline mass and no mass. There is no hepatosplenomegaly. There is no tenderness. There is no rigidity, no rebound, no guarding, no CVA tenderness, no tenderness at McBurney's point and negative Murphy's sign. No hernia.  Musculoskeletal: She exhibits tenderness. She exhibits no edema.  Tender over entire lumbar spine -worse higher  R lumbar musculature spasm  Lymphadenopathy:    She has no cervical adenopathy.  Neurological: She is alert. She has normal reflexes. She displays normal reflexes. Coordination normal.  Skin: Skin is warm and dry. No rash noted.  Psychiatric: She has a normal mood and affect.  Pleasant           Assessment & Plan:   Problem List Items Addressed This Visit      Musculoskeletal and Integument   Compression fracture of L1 vertebra (HCC)    L1 Numerous others in the past On prolia for OP  Has ortho f/u  Unable to take tramadol due to constipation  Disc heat and avoidance of high impact activity       Osteoporosis    Now on prolia tx        Other   Constipation - Primary    Worsened by recent tramadol Disc a miralax regimen- every 4 h while awake until bm Also lactulose  15-30 cc daily for 3 d or until bm  (may have to check K) Prunes/fiber/fluids Watch for abd pain or vomiting Reviewed hospital records, lab results and studies in detail   Update if not starting to improve in a week or if worsening

## 2018-01-24 NOTE — Patient Instructions (Addendum)
Get miralax (store band is fine) and take one dose (capful in fluid as directed) up to every 4 hours while awake until your bowels start moving   Also 15-30 mL of lactulose once daily for 3 days (or stop once you have a bowel movement)   If vomiting or severe abdominal pain- get to the emergency room   Prunes and prune juice are also helpful   Get some extra potassium  Take a multi vitamin without iron and take daily  Eat cantaloupe and drink orange juice since they are high in potassium  If you get muscle cramps please let us know   We may need to check your potassium level next week - let us know how you are doing in a few days

## 2018-01-25 NOTE — Assessment & Plan Note (Addendum)
L1 Numerous others in the past On prolia for OP  Has ortho f/u  Unable to take tramadol due to constipation  Disc heat and avoidance of high impact activity

## 2018-01-25 NOTE — Assessment & Plan Note (Signed)
Now on prolia tx

## 2018-01-25 NOTE — Assessment & Plan Note (Signed)
Worsened by recent tramadol Disc a miralax regimen- every 4 h while awake until bm Also lactulose 15-30 cc daily for 3 d or until bm  (may have to check K) Prunes/fiber/fluids Watch for abd pain or vomiting Reviewed hospital records, lab results and studies in detail   Update if not starting to improve in a week or if worsening

## 2018-01-26 ENCOUNTER — Telehealth: Payer: Self-pay | Admitting: *Deleted

## 2018-01-26 NOTE — Telephone Encounter (Signed)
Do they have any temazepam at all of another strength?

## 2018-01-26 NOTE — Telephone Encounter (Signed)
Received fax from [facility] saying that pt's temazepam 15mg  and 30mg  is on back order and they requested that you send in a new Rx for an alt medicaiton

## 2018-01-27 MED ORDER — TEMAZEPAM 7.5 MG PO CAPS: 30.0000 mg | ORAL_CAPSULE | Freq: Every evening | ORAL | 0 refills | Status: DC | PRN

## 2018-01-27 NOTE — Telephone Encounter (Signed)
I'm sending the 7.5 mg and giving 4 pills at bedtime   Please ask the pharmacy to let us know when the 30 mg is avail again so we can switch back

## 2018-01-27 NOTE — Telephone Encounter (Signed)
Pharmacy tech advise me that have a 7.5 mg caps and a 22.5 mg caps available

## 2018-02-02 ENCOUNTER — Other Ambulatory Visit: Payer: Self-pay | Admitting: Family Medicine

## 2018-02-03 MED ORDER — LORAZEPAM 1 MG PO TABS: 1.0000 mg | ORAL_TABLET | Freq: Every day | ORAL | 0 refills | Status: DC

## 2018-02-03 NOTE — Telephone Encounter (Signed)
Pt is aware.  

## 2018-02-03 NOTE — Telephone Encounter (Signed)
Pt stated Temazepam is too expensive $400. Please help change to something cheaper.

## 2018-02-03 NOTE — Telephone Encounter (Signed)
I am not doing the standard 90 day supply - because this is new and we are not sure how it will work (and being a controlled substance that is not optimal)   I will send 30 d  Supply to see how it works and she can let me know   Caution of sedation and falls  - like the temazepam   Thanks

## 2018-02-03 NOTE — Telephone Encounter (Signed)
Please advise, pharmacy sent a message want to see if Dr. Glori Bickers wants to change to :  LORAZEPAM 0.5MG , 1MG  OR 2MG  TAB , COPAY $8.00, TRAZODON50MG , 100MG  OR 150MG  TAB/2.00.

## 2018-02-03 NOTE — Telephone Encounter (Signed)
I would only change if pt wants me to = we had to inc the # of pills due to a back order if I remember completely and would rather not change it if we do not have to (however if not affordable I understand)  Please ask pt if we need to change it for affordability and let me know  Hopefully her past dose of temazepam will become available again soon

## 2018-02-14 ENCOUNTER — Ambulatory Visit: Payer: Medicare HMO | Admitting: Gastroenterology

## 2018-02-15 ENCOUNTER — Ambulatory Visit: Payer: Medicare HMO | Admitting: Gastroenterology

## 2018-02-15 ENCOUNTER — Encounter: Payer: Self-pay | Admitting: Gastroenterology

## 2018-02-15 VITALS — BP 115/69 | HR 77 | Ht 63.0 in | Wt 111.4 lb

## 2018-02-15 DIAGNOSIS — S32010A Wedge compression fracture of first lumbar vertebra, initial encounter for closed fracture: Secondary | ICD-10-CM | POA: Diagnosis not present

## 2018-02-15 DIAGNOSIS — R1319 Other dysphagia: Secondary | ICD-10-CM

## 2018-02-15 DIAGNOSIS — R1310 Dysphagia, unspecified: Secondary | ICD-10-CM

## 2018-02-15 DIAGNOSIS — M7061 Trochanteric bursitis, right hip: Secondary | ICD-10-CM | POA: Diagnosis not present

## 2018-02-16 NOTE — Progress Notes (Signed)
[name] Antigua, MD [address]  [place]  [place], [place] [address]  Main: [phone]  Fax: [phone]   Primary Care Physician: Tower, Wynelle Fanny, MD  Primary Gastroenterologist:  Dr. Vonda [name]  Chief Complaint  Patient presents with  . Follow-up    3 month for constipation which has improved.    HPI: [name] [name] is a 72 y.o. female initially referred in March 2019 for one episode of loose stools that self resolved and intermittent bilateral lower quadrant abdominal pain.  She was asked to start taking MiraLAX as she was reporting hard stools the week prior to presentation.  She has been using this, and reports regular bowel movements without diarrhea or constipation at this time.  No blood in stool, no melena.  Patient needs a screening colonoscopy as she has never had one before.  We are awaiting cardiac clearance, as she had reported intermittent chest pain has undergone stress test and cardiac cath by her cardiologist.  She will also need an EGD due to intermittent dysphagia as well.  Current Outpatient Medications  Medication Sig Dispense Refill  . albuterol (PROVENTIL HFA;VENTOLIN HFA) 108 (90 BASE) MCG/ACT inhaler Inhale 2 puffs into the lungs every 4 (four) hours as needed for wheezing. 1 Inhaler 11  . atorvastatin (LIPITOR) 80 MG tablet Take 1 tablet (80 mg total) by mouth daily. 90 tablet 3  . Calcium Carbonate-Vit D-Min (CALCIUM 1200 PO) Take 1 tablet by mouth daily.     Marland Kitchen ezetimibe (ZETIA) 10 MG tablet Take 1 tablet (10 mg total) by mouth daily. 90 tablet 3  . lactulose (CHRONULAC) 10 GM/15ML solution Take 15-30 mL by mouth once daily for 3 days or until you have a bowel movement 100 mL 0  . levothyroxine (SYNTHROID, LEVOTHROID) 100 MCG tablet TAKE 1 TABLET EVERY DAY BEFORE BREAKFAST 90 tablet 3  . lisinopril (PRINIVIL,ZESTRIL) 10 MG tablet Take 0.5 tablets (5 mg total) by mouth daily. 45 tablet 3  . loratadine (CLARITIN) 10 MG tablet Take 1 tablet  (10 mg total) by mouth daily.    Marland Kitchen LORazepam (ATIVAN) 1 MG tablet Take 1 tablet (1 mg total) by mouth at bedtime. 30 tablet 0  . magnesium citrate SOLN Take 296 mLs (1 Bottle total) by mouth once as needed for up to 1 dose (Constipation). 296 mL 0  . nitroGLYCERIN (NITROSTAT) 0.4 MG SL tablet Place 1 tablet (0.4 mg total) under the tongue every 5 (five) minutes as needed for chest pain. 25 tablet 3  . nortriptyline (PAMELOR) 25 MG capsule TAKE 1 CAPSULE AT BEDTIME WITH A 75MG  CAPSULE 90 capsule 3  . nortriptyline (PAMELOR) 75 MG capsule Take 1 capsule (75 mg total) by mouth at bedtime. 90 capsule 3  . omeprazole (PRILOSEC) 20 MG capsule Take 1 capsule (20 mg total) by mouth 2 (two) times daily before a meal. 60 capsule 1  . temazepam (RESTORIL) 7.5 MG capsule Take 4 capsules (30 mg total) by mouth at bedtime as needed for sleep. 360 capsule 0   No current facility-administered medications for this visit.     Allergies as of 02/15/2018 - Review Complete 02/15/2018  Allergen Reaction Noted  . Fosamax [alendronate sodium]  06/28/2014  . Raloxifene hcl Other (See Comments) 09/26/2017    ROS:  General: Negative for anorexia, weight loss, fever, chills, fatigue, weakness. ENT: Negative for hoarseness, difficulty swallowing , nasal congestion. CV: Negative for chest pain, angina, palpitations, dyspnea on exertion, peripheral edema.  Respiratory: Negative for  dyspnea at rest, dyspnea on exertion, cough, sputum, wheezing.  GI: See history of present illness. GU:  Negative for dysuria, hematuria, urinary incontinence, urinary frequency, nocturnal urination.  Endo: Negative for unusual weight change.    Physical Examination:   BP 115/69   Pulse 77   Ht 5\' 3"  (1.6 m)   Wt 111 lb 6.4 oz (50.5 kg)   BMI 19.73 kg/m   General: Well-nourished, well-developed in no acute distress.  Eyes: No icterus. Conjunctivae pink. Mouth: Oropharyngeal mucosa moist and pink , no lesions erythema or  exudate. Neck: Supple, Trachea midline Abdomen: Bowel sounds are normal, nontender, nondistended, no hepatosplenomegaly or masses, no abdominal bruits or hernia , no rebound or guarding.   Extremities: No lower extremity edema. No clubbing or deformities. Neuro: Alert and oriented x 3.  Grossly intact. Skin: Warm and dry, no jaundice.   Psych: Alert and cooperative, normal mood and affect.   Labs: CMP     Component Value Date/Time   NA 141 12/14/2017 0946   NA 138 12/13/2013 1904   K 3.8 12/14/2017 0946   K 3.6 12/13/2013 1904   CL 106 12/14/2017 0946   CL 104 12/13/2013 1904   CO2 30 12/14/2017 0946   CO2 29 12/13/2013 1904   GLUCOSE 93 12/14/2017 0946   GLUCOSE 136 (H) 12/13/2013 1904   BUN 10 12/14/2017 0946   BUN 12 12/13/2013 1904   CREATININE 0.62 12/14/2017 0946   CREATININE 0.72 12/13/2013 1904   CALCIUM 8.6 (L) 12/14/2017 0946   CALCIUM 8.6 12/13/2013 1904   PROT 6.8 11/01/2017 1051   ALBUMIN 3.9 11/01/2017 1051   AST 16 11/01/2017 1051   ALT 10 11/01/2017 1051   ALKPHOS 64 11/01/2017 1051   BILITOT 0.4 11/01/2017 1051   GFRNONAA >60 12/14/2017 0946   GFRNONAA >60 12/13/2013 1904   GFRAA >60 12/14/2017 0946   GFRAA >60 12/13/2013 1904   Lab Results  Component Value Date   WBC 3.9 12/14/2017   HGB 13.8 12/14/2017   HCT 41.1 12/14/2017   MCV 93.4 12/14/2017   PLT 236 12/14/2017    Imaging Studies: Dg Lumbar Spine Complete  Result Date: 01/19/2018 CLINICAL DATA:  Low back pain after fall 1 week ago. EXAM: LUMBAR SPINE - COMPLETE 4+ VIEW COMPARISON:  Lumbar spine x-rays dated August 31, 2017. FINDINGS: Five lumbar type vertebral bodies. New mild superior endplate compression deformity at L1. Unchanged L4 compression fracture. Alignment is normal. Intervertebral disc spaces are maintained. Aortic atherosclerosis. IMPRESSION: 1. New mild superior endplate compression fracture at L1. 2. Unchanged chronic L4 compression fracture. Electronically Signed   By:  Titus Dubin M.D.   On: 01/19/2018 17:07   Dg Abdomen 1 View  Result Date: 01/22/2018 CLINICAL DATA:  Constipation for 2 weeks EXAM: ABDOMEN - 1 VIEW COMPARISON:  None. FINDINGS: Moderate volume stool in the ascending, transverse and descending colon. Moderate volume stool in the rectum. No dilated loops of large or small bowel. IMPRESSION: Moderate volume stool throughout the colon similar to prior. Findings consistent with constipation. Electronically Signed   By: Suzy Bouchard M.D.   On: 01/22/2018 12:29   Dg Hip Unilat W Or Wo Pelvis 2-3 Views Right  Result Date: 01/19/2018 CLINICAL DATA:  Right hip pain after fall a week ago. EXAM: DG HIP (WITH OR WITHOUT PELVIS) 2-3V RIGHT COMPARISON:  None. FINDINGS: No acute fracture or dislocation. Hip joint spaces are relatively preserved. Diffuse osteopenia. Soft tissues are unremarkable. IMPRESSION: Negative. Electronically Signed  By: Titus Dubin M.D.   On: 01/19/2018 17:03    Assessment and Plan:   MYLISSA LAMBE is a 72 y.o. y/o female with constipation, and one episode of loose stool, now with regular bowel habits with MiraLAX daily, and also with intermittent dysphasia  EGD indicated for dysphagia, and colonoscopy indicated for screening This will be scheduled We will also await cardiac clearance prior to the procedures I have discussed alternative options, risks & benefits,  which include, but are not limited to, bleeding, infection, perforation,respiratory complication & drug reaction.  The patient agrees with this plan & written consent will be obtained.    Continue MiraLAX   Dr [name] [name]

## 2018-02-17 ENCOUNTER — Other Ambulatory Visit: Payer: Self-pay

## 2018-02-17 DIAGNOSIS — Z1211 Encounter for screening for malignant neoplasm of colon: Secondary | ICD-10-CM

## 2018-02-17 DIAGNOSIS — R1310 Dysphagia, unspecified: Secondary | ICD-10-CM

## 2018-02-17 DIAGNOSIS — R1319 Other dysphagia: Secondary | ICD-10-CM

## 2018-02-17 MED ORDER — PEG 3350-KCL-NA BICARB-NACL 420 G PO SOLR: ORAL | 0 refills | Status: DC

## 2018-02-17 MED ORDER — BISACODYL 5 MG PO TBEC: 10.0000 mg | DELAYED_RELEASE_TABLET | Freq: Once | ORAL | 0 refills | Status: AC

## 2018-02-22 NOTE — Progress Notes (Signed)
Acceptable risk for GI procedure No further testing needed

## 2018-03-14 ENCOUNTER — Other Ambulatory Visit: Payer: Self-pay | Admitting: *Deleted

## 2018-03-14 MED ORDER — LORAZEPAM 1 MG PO TABS: 1.0000 mg | ORAL_TABLET | Freq: Every day | ORAL | 0 refills | Status: DC

## 2018-03-14 NOTE — Telephone Encounter (Signed)
**  fax request 90 day supply since pt is still on med and pharmacy is a mail order Name of Medication: lorazepam  Name of Pharmacy: [facility] Mail order Last Fill or Written Date and Quantity: 02/03/18 #30 with 0 refills  Last Office Visit and Type: 01/24/18 ER f/u Next Office Visit and Type: 09/29/18 CPE Last Controlled Substance Agreement Date: 07/09/16 Last UDS:07/09/16

## 2018-03-15 ENCOUNTER — Ambulatory Visit: Payer: Medicare HMO | Admitting: Registered Nurse

## 2018-03-15 ENCOUNTER — Encounter
Admission: RE | Disposition: A | Discharge status: Home / Self Care | Payer: Self-pay | Source: Ambulatory Visit | Attending: Gastroenterology

## 2018-03-15 ENCOUNTER — Ambulatory Visit
Admission: RE | Admit: 2018-03-15 | Discharge: 2018-03-15 | Disposition: A | Discharge status: Home / Self Care | Payer: Medicare HMO | Source: Ambulatory Visit | Attending: Gastroenterology | Admitting: Gastroenterology

## 2018-03-15 ENCOUNTER — Other Ambulatory Visit: Payer: Self-pay | Admitting: Gastroenterology

## 2018-03-15 ENCOUNTER — Encounter: Payer: Self-pay | Admitting: *Deleted

## 2018-03-15 DIAGNOSIS — E039 Hypothyroidism, unspecified: Secondary | ICD-10-CM | POA: Diagnosis not present

## 2018-03-15 DIAGNOSIS — J449 Chronic obstructive pulmonary disease, unspecified: Secondary | ICD-10-CM | POA: Diagnosis not present

## 2018-03-15 DIAGNOSIS — K3189 Other diseases of stomach and duodenum: Secondary | ICD-10-CM | POA: Diagnosis not present

## 2018-03-15 DIAGNOSIS — K2950 Unspecified chronic gastritis without bleeding: Secondary | ICD-10-CM | POA: Insufficient documentation

## 2018-03-15 DIAGNOSIS — K23 Disorders of esophagus in diseases classified elsewhere: Secondary | ICD-10-CM | POA: Diagnosis not present

## 2018-03-15 DIAGNOSIS — I10 Essential (primary) hypertension: Secondary | ICD-10-CM | POA: Diagnosis not present

## 2018-03-15 DIAGNOSIS — F17210 Nicotine dependence, cigarettes, uncomplicated: Secondary | ICD-10-CM | POA: Insufficient documentation

## 2018-03-15 DIAGNOSIS — E785 Hyperlipidemia, unspecified: Secondary | ICD-10-CM | POA: Diagnosis not present

## 2018-03-15 DIAGNOSIS — M1990 Unspecified osteoarthritis, unspecified site: Secondary | ICD-10-CM | POA: Diagnosis not present

## 2018-03-15 DIAGNOSIS — R1319 Other dysphagia: Secondary | ICD-10-CM

## 2018-03-15 DIAGNOSIS — D122 Benign neoplasm of ascending colon: Secondary | ICD-10-CM

## 2018-03-15 DIAGNOSIS — Z888 Allergy status to other drugs, medicaments and biological substances status: Secondary | ICD-10-CM | POA: Diagnosis not present

## 2018-03-15 DIAGNOSIS — Z8249 Family history of ischemic heart disease and other diseases of the circulatory system: Secondary | ICD-10-CM | POA: Diagnosis not present

## 2018-03-15 DIAGNOSIS — Z1211 Encounter for screening for malignant neoplasm of colon: Secondary | ICD-10-CM

## 2018-03-15 DIAGNOSIS — M810 Age-related osteoporosis without current pathological fracture: Secondary | ICD-10-CM | POA: Diagnosis not present

## 2018-03-15 DIAGNOSIS — K228 Other specified diseases of esophagus: Secondary | ICD-10-CM

## 2018-03-15 DIAGNOSIS — F329 Major depressive disorder, single episode, unspecified: Secondary | ICD-10-CM | POA: Insufficient documentation

## 2018-03-15 DIAGNOSIS — E7800 Pure hypercholesterolemia, unspecified: Secondary | ICD-10-CM | POA: Diagnosis not present

## 2018-03-15 DIAGNOSIS — K635 Polyp of colon: Secondary | ICD-10-CM | POA: Diagnosis not present

## 2018-03-15 DIAGNOSIS — Z79899 Other long term (current) drug therapy: Secondary | ICD-10-CM | POA: Insufficient documentation

## 2018-03-15 DIAGNOSIS — K6389 Other specified diseases of intestine: Secondary | ICD-10-CM | POA: Diagnosis not present

## 2018-03-15 DIAGNOSIS — R1310 Dysphagia, unspecified: Secondary | ICD-10-CM | POA: Insufficient documentation

## 2018-03-15 DIAGNOSIS — D123 Benign neoplasm of transverse colon: Secondary | ICD-10-CM

## 2018-03-15 DIAGNOSIS — Z9861 Coronary angioplasty status: Secondary | ICD-10-CM | POA: Insufficient documentation

## 2018-03-15 DIAGNOSIS — K2289 Other specified disease of esophagus: Secondary | ICD-10-CM

## 2018-03-15 HISTORY — PX: ESOPHAGOGASTRODUODENOSCOPY (EGD) WITH PROPOFOL: SHX5813

## 2018-03-15 HISTORY — DX: Chronic obstructive pulmonary disease, unspecified: J44.9

## 2018-03-15 HISTORY — DX: Hyperlipidemia, unspecified: E78.5

## 2018-03-15 HISTORY — PX: COLONOSCOPY WITH PROPOFOL: SHX5780

## 2018-03-15 HISTORY — DX: Essential (primary) hypertension: I10

## 2018-03-15 LAB — KOH PREP

## 2018-03-15 SURGERY — COLONOSCOPY WITH PROPOFOL
Anesthesia: General

## 2018-03-15 MED ORDER — GLYCOPYRROLATE 0.2 MG/ML IJ SOLN
INTRAMUSCULAR | Status: DC | PRN
  Administered 2018-03-15: 0.2 mg via INTRAVENOUS

## 2018-03-15 MED ORDER — FLUCONAZOLE 200 MG PO TABS: ORAL_TABLET | ORAL | 0 refills | Status: AC

## 2018-03-15 MED ORDER — GLYCOPYRROLATE 0.2 MG/ML IJ SOLN
INTRAMUSCULAR | Status: AC
  Filled 2018-03-15: qty 1

## 2018-03-15 MED ORDER — SODIUM CHLORIDE 0.9 % IV SOLN
INTRAVENOUS | Status: DC
  Administered 2018-03-15: 1000 mL via INTRAVENOUS

## 2018-03-15 MED ORDER — LIDOCAINE HCL (CARDIAC) PF 100 MG/5ML IV SOSY
PREFILLED_SYRINGE | INTRAVENOUS | Status: DC | PRN
  Administered 2018-03-15: 40 mg via INTRAVENOUS

## 2018-03-15 MED ORDER — PROPOFOL 500 MG/50ML IV EMUL
INTRAVENOUS | Status: AC
  Filled 2018-03-15: qty 50

## 2018-03-15 MED ORDER — PROPOFOL 500 MG/50ML IV EMUL
INTRAVENOUS | Status: DC | PRN
  Administered 2018-03-15: 150 ug/kg/min via INTRAVENOUS

## 2018-03-15 MED ORDER — PROPOFOL 10 MG/ML IV BOLUS
INTRAVENOUS | Status: DC | PRN
  Administered 2018-03-15 (×2): 10 mg via INTRAVENOUS
  Administered 2018-03-15: 70 mg via INTRAVENOUS

## 2018-03-15 MED ORDER — FENTANYL CITRATE (PF) 100 MCG/2ML IJ SOLN
INTRAMUSCULAR | Status: AC
  Filled 2018-03-15: qty 2

## 2018-03-15 MED ORDER — FENTANYL CITRATE (PF) 100 MCG/2ML IJ SOLN
INTRAMUSCULAR | Status: DC | PRN
  Administered 2018-03-15: 25 ug via INTRAVENOUS

## 2018-03-15 NOTE — Anesthesia Procedure Notes (Signed)
Date/Time: 03/15/2018 9:05 AM Performed by: Doreen Salvage, CRNA Pre-anesthesia Checklist: Patient identified, Emergency Drugs available, Suction available and Patient being monitored Patient Re-evaluated:Patient Re-evaluated prior to induction Oxygen Delivery Method: Nasal cannula Induction Type: IV induction Dental Injury: Teeth and Oropharynx as per pre-operative assessment  Comments: Nasal cannula with etCO2 monitoring

## 2018-03-15 NOTE — Op Note (Signed)
[facility] G[facility]enterology Patient Name: [name] [name] Procedure Date: 03/15/2018 9:03 AM MRN: [id number] Account #: [id number] Date of Birth: 01/17/1946 Admit Type: Outpatient Age: 71 Room: [facility] ENDO ROOM 2 Gender: Female Note Status: Finalized Procedure:            Upper GI endoscopy Indications:          Dysphagia Providers:            Erastus Bartolomei B. Bonna Gains MD, MD Referring MD:         Wynelle Fanny. Tower (Referring MD) Medicines:            Monitored Anesthesia Care Complications:        No immediate complications. Procedure:            Pre-Anesthesia Assessment:                       - Prior to the procedure, a History and Physical was                        performed, and patient medications, allergies and                        sensitivities were reviewed. The patient's tolerance of                        previous anesthesia was reviewed.                       - The risks and benefits of the procedure and the                        sedation options and risks were discussed with the                        patient. All questions were answered and informed                        consent was obtained.                       - Patient identification and proposed procedure were                        verified prior to the procedure by the physician, the                        nurse, the anesthesiologist, the anesthetist and the                        technician. The procedure was verified in the procedure                        room.                       - ASA Grade Assessment: III - A patient with severe                        systemic disease.                       After obtaining  informed consent, the endoscope was                        passed under direct vision. Throughout the procedure,                        the patient's blood pressure, pulse, and oxygen                        saturations were monitored continuously. The Endoscope                        was  introduced through the mouth, and advanced to the                        second part of duodenum. The upper GI endoscopy was                        accomplished with ease. The patient tolerated the                        procedure well. Findings:      White nummular lesions were noted in the entire esophagus. Biopsies were       taken with a cold forceps for histology. Brushings for KOH prep were       obtained. Biopsies were obtained from the proximal and distal esophagus       with cold forceps for histology of suspected eosinophilic esophagitis.      Patchy mildly erythematous mucosa without bleeding was found in the       gastric antrum. Biopsies were taken with a cold forceps for histology.       Biopsies were obtained in the gastric body, at the incisura and in the       gastric antrum with cold forceps for histology.      A single localized, 2 mm non-bleeding erosion was found in the gastric       body. There were no stigmata of recent bleeding.      The duodenal bulb, second portion of the duodenum and examined duodenum       were normal. Impression:           - White nummular lesions in esophageal mucosa.                        Biopsied. Brushings performed. These likely represent                        glycogen acanthosis                       - Erythematous mucosa in the antrum. Biopsied.                       - Non-bleeding erosive gastropathy.                       - Normal duodenal bulb, second portion of the duodenum                        and examined duodenum.                       -  Biopsies were obtained in the gastric body, at the                        incisura and in the gastric antrum. Recommendation:       - Await pathology results.                       - Discharge patient to home (with escort).                       - Advance diet as tolerated.                       - Continue present medications.                       - Patient has a contact number available  for                        emergencies. The signs and symptoms of potential                        delayed complications were discussed with the patient.                        Return to normal activities tomorrow. Written discharge                        instructions were provided to the patient.                       - Discharge patient to home (with escort).                       - The findings and recommendations were discussed with                        the patient.                       - The findings and recommendations were discussed with                        the patient's family. Procedure Code(s):    --- Professional ---                       [phone], Esophagogastroduodenoscopy, flexible, transoral;                        with biopsy, single or multiple Diagnosis Code(s):    --- Professional ---                       K22.8, Other specified diseases of esophagus                       K31.89, Other diseases of stomach and duodenum                       R13.10, Dysphagia, unspecified CPT copyright 2017 American Medical Association. All rights reserved. The codes documented in this report are preliminary and upon coder review may  be revised to meet  current compliance requirements.  Vonda Antigua, MD Margretta Sidle B. Bonna Gains MD, MD 03/15/2018 9:29:02 AM This report has been signed electronically. Number of Addenda: 0 Note Initiated On: 03/15/2018 9:03 AM Estimated Blood Loss: Estimated blood loss: none.      [facility]

## 2018-03-15 NOTE — Anesthesia Preprocedure Evaluation (Addendum)
Anesthesia Evaluation  Patient identified by MRN, date of birth, ID band Patient awake    Reviewed: Allergy & Precautions, H&P , NPO status , Patient's Chart, lab work & pertinent test results, reviewed documented beta blocker date and time   History of Anesthesia Complications Negative for: history of anesthetic complications  Airway Mallampati: II  TM Distance: >3 FB Neck ROM: full    Dental no notable dental hx. (+) Dental Advidsory Given, Upper Dentures, Lower Dentures   Pulmonary neg pulmonary ROS, COPD (no recent exacerbations, at baseline per pt),  COPD inhaler, Current Smoker,     + decreased breath sounds      Cardiovascular Exercise Tolerance: Good hypertension, negative cardio ROS Normal cardiovascular exam(-) dysrhythmias      Neuro/Psych negative neurological ROS  negative psych ROS   GI/Hepatic negative GI ROS, Neg liver ROS,   Endo/Other  negative endocrine ROSHypothyroidism   Renal/GU negative Renal ROS  negative genitourinary   Musculoskeletal   Abdominal   Peds  Hematology negative hematology ROS (+)   Anesthesia Other Findings Past Medical History: No date: COPD (chronic obstructive pulmonary disease) (HCC) No date: Depression No date: Hyperlipidemia No date: Hypertension No date: Hypothyroidism No date: Osteoarthritis No date: Osteoporosis No date: Shingles     Comment:  arm 1/12 No date: Sleep disorder No date: Tobacco abuse     Comment:  past; quit 09  Past Surgical History: No date: ABDOMINAL HYSTERECTOMY No date: BIOPSY THYROID     Comment:  pos neoplasm, surgery 09/2006 06/1998: bunions No date: CORONARY ANGIOPLASTY 12/2006: CXR-copd, ts partial compression fracture 12/1996: dexa 02/1997: hashimoto No date: hyerectomy- cervical ca cells 12/23/2017: LEFT HEART CATH AND CORONARY ANGIOGRAPHY; Left     Comment:  Procedure: LEFT HEART CATH AND CORONARY ANGIOGRAPHY;    Surgeon: Minna Merritts, MD;  Location: [place]               CV LAB;  Service: Cardiovascular;  Laterality: Left; No date: left wrist fracture     Comment:  surgery 12/2006: right thyroid lobectomy, goiter, thyroiditis No date: stress fractures foot 11/2009: THYROIDECTOMY  BMI    Body Mass Index:  19.49 kg/m     Reproductive/Obstetrics negative OB ROS                             Anesthesia Physical Anesthesia Plan  ASA: III  Anesthesia Plan: General   Post-op Pain Management:    Induction:   PONV Risk Score and Plan:   Airway Management Planned:   Additional Equipment:   Intra-op Plan:   Post-operative Plan:   Informed Consent:   Dental Advisory Given  Plan Discussed with: Anesthesiologist, CRNA and Surgeon  Anesthesia Plan Comments:        Anesthesia Quick Evaluation

## 2018-03-15 NOTE — Op Note (Signed)
[facility] Gastroenterology Patient Name: [name] [name] Procedure Date: 03/15/2018 9:04 AM MRN: [id number] Account #: [id number] Date of Birth: 1945-11-26 Admit Type: Outpatient Age: 72 Room: [facility] ENDO ROOM 2 Gender: Female Note Status: Finalized Procedure:            Colonoscopy Indications:          Screening for colorectal malignant neoplasm Providers:            Cambelle Suchecki B. Bonna Gains MD, MD Referring MD:         Wynelle Fanny. Tower (Referring MD) Medicines:            Monitored Anesthesia Care Complications:        No immediate complications. Procedure:            Pre-Anesthesia Assessment:                       - ASA Grade Assessment: III - A patient with severe                        systemic disease.                       - Prior to the procedure, a History and Physical was                        performed, and patient medications, allergies and                        sensitivities were reviewed. The patient's tolerance of                        previous anesthesia was reviewed.                       - The risks and benefits of the procedure and the                        sedation options and risks were discussed with the                        patient. All questions were answered and informed                        consent was obtained.                       - Patient identification and proposed procedure were                        verified prior to the procedure by the physician, the                        nurse, the anesthesiologist, the anesthetist and the                        technician. The procedure was verified in the procedure                        room.  After obtaining informed consent, the colonoscope was                        passed under direct vision. Throughout the procedure,                        the patient's blood pressure, pulse, and oxygen                        saturations were monitored continuously. The                 Colonoscope was introduced through the anus and                        advanced to the the cecum, identified by appendiceal                        orifice and ileocecal valve. The colonoscopy was                        performed with ease. The patient tolerated the                        procedure well. The quality of the bowel preparation                        was poor. Findings:      The perianal and digital rectal examinations were normal.      A 3 mm polyp was found in the ascending colon. The polyp was sessile.       The polyp was removed with a cold biopsy forceps. Resection and       retrieval were complete.      A 5 mm polyp was found in the ascending colon. The polyp was flat. The       polyp was removed with a cold snare. Resection and retrieval were       complete.      A 10 mm polyp was found in the transverse colon. The polyp was flat.       Area was successfully injected with saline for lesion assessment, and       this injection appeared to lift the lesion adequately. The polyp was       removed with a cold snare. Resection and retrieval were complete. To       prevent bleeding after the polypectomy, one hemostatic clip was       successfully placed. There was no bleeding at the end of the procedure.      A patchy area of moderate melanosis was found in the ascending colon and       in the cecum.      The exam was otherwise without abnormality.      The rectum, sigmoid colon, descending colon, transverse colon, ascending       colon and cecum appeared normal.      The retroflexed view of the distal rectum and anal verge was normal and       showed no anal or rectal abnormalities. Impression:           - Preparation of the colon was poor.                       -  One 3 mm polyp in the ascending colon, removed with a                        cold biopsy forceps. Resected and retrieved.                       - One 5 mm polyp in the ascending colon, removed with a                         cold snare. Resected and retrieved.                       - One 10 mm polyp in the transverse colon, removed with                        a cold snare. Resected and retrieved. Injected. Clip                        was placed.                       - Melanosis in the colon.                       - The examination was otherwise normal.                       - The rectum, sigmoid colon, descending colon,                        transverse colon, ascending colon and cecum are normal.                       - The distal rectum and anal verge are normal on                        retroflexion view. Recommendation:       - Discharge patient to home (with escort).                       - Advance diet as tolerated.                       - Continue present medications.                       - Await pathology results.                       - Repeat colonoscopy 3-6 months for surveillance with 2                        day prep.                       - The findings and recommendations were discussed with                        the patient.                       - The findings and recommendations were discussed with  the patient's family.                       - Return to primary care physician as previously                        scheduled. Procedure Code(s):    --- Professional ---                       [phone], Colonoscopy, flexible; with removal of tumor(s),                        polyp(s), or other lesion(s) by snare technique                       45380, 61, Colonoscopy, flexible; with biopsy, single                        or multiple                       45381, Colonoscopy, flexible; with directed submucosal                        injection(s), any substance Diagnosis Code(s):    --- Professional ---                       Z12.11, Encounter for screening for malignant neoplasm                        of colon                       D12.2, Benign neoplasm  of ascending colon                       D12.3, Benign neoplasm of transverse colon (hepatic                        flexure or splenic flexure)                       K63.89, Other specified diseases of intestine CPT copyright 2017 American Medical Association. All rights reserved. The codes documented in this report are preliminary and upon coder review may  be revised to meet current compliance requirements.  Vonda Antigua, MD Margretta Sidle B. Bonna Gains MD, MD 03/15/2018 10:10:57 AM This report has been signed electronically. Number of Addenda: 0 Note Initiated On: 03/15/2018 9:04 AM Scope Withdrawal Time: 0 hours 22 minutes 38 seconds  Total Procedure Duration: 0 hours 32 minutes 55 seconds  Estimated Blood Loss: Estimated blood loss: none.      [facility]

## 2018-03-15 NOTE — H&P (Signed)
Vonda Antigua, MD [address] [place], [place], [place], [address] [id number], [place], [place], [place], [address] Phone: [phone]  Fax: [phone]  Primary Care Physician:  Tower, Wynelle Fanny, MD   Pre-Procedure History & Physical: HPI:  [name] [name] is a 72 y.o. female is here for a colonoscopy and EGD.   Past Medical History:  Diagnosis Date  . COPD (chronic obstructive pulmonary disease) ([place])   . Depression   . Hyperlipidemia   . Hypertension   . Hypothyroidism   . Osteoarthritis   . Osteoporosis   . Shingles    arm 1/12  . Sleep disorder   . Tobacco abuse    past; quit 09    Past Surgical History:  Procedure Laterality Date  . ABDOMINAL HYSTERECTOMY    . BIOPSY THYROID     pos neoplasm, surgery 09/2006  . bunions  06/1998  . CORONARY ANGIOPLASTY    . CXR-copd, ts partial compression fracture  12/2006  . dexa  12/1996  . hashimoto  02/1997  . hyerectomy- cervical ca cells    . LEFT HEART CATH AND CORONARY ANGIOGRAPHY Left 12/23/2017   Procedure: LEFT HEART CATH AND CORONARY ANGIOGRAPHY;  Surgeon: Minna Merritts, MD;  Location: [place] CV LAB;  Service: Cardiovascular;  Laterality: Left;  . left wrist fracture     surgery  . right thyroid lobectomy, goiter, thyroiditis  12/2006  . stress fractures foot    . THYROIDECTOMY  11/2009    Prior to Admission medications   Medication Sig Start Date End Date Taking? Authorizing Provider  albuterol (PROVENTIL HFA;VENTOLIN HFA) 108 (90 BASE) MCG/ACT inhaler Inhale 2 puffs into the lungs every 4 (four) hours as needed for wheezing. 06/28/14  Yes Tower, Wynelle Fanny, MD  atorvastatin (LIPITOR) 80 MG tablet Take 1 tablet (80 mg total) by mouth daily. 09/26/17  Yes Tower, Wynelle Fanny, MD  Calcium Carbonate-Vit D-Min (CALCIUM 1200 PO) Take 1 tablet by mouth daily.    Yes [provider]  lactulose (CHRONULAC) 10 GM/15ML solution Take 15-30 mL by mouth once daily for 3 days or until you have a bowel movement  01/24/18  Yes Tower, Wynelle Fanny, MD  levothyroxine (SYNTHROID, LEVOTHROID) 100 MCG tablet TAKE 1 TABLET EVERY DAY BEFORE BREAKFAST 09/26/17  Yes Tower, Marne A, MD  lisinopril (PRINIVIL,ZESTRIL) 10 MG tablet Take 0.5 tablets (5 mg total) by mouth daily. 09/26/17  Yes Tower, Wynelle Fanny, MD  nortriptyline (PAMELOR) 25 MG capsule TAKE 1 CAPSULE AT BEDTIME WITH A 75MG  CAPSULE 09/26/17  Yes Tower, Wynelle Fanny, MD  ezetimibe (ZETIA) 10 MG tablet Take 1 tablet (10 mg total) by mouth daily. 01/09/18 04/09/18  Minna Merritts, MD  loratadine (CLARITIN) 10 MG tablet Take 1 tablet (10 mg total) by mouth daily. 04/26/16   Tonia Ghent, MD  LORazepam (ATIVAN) 1 MG tablet Take 1 tablet (1 mg total) by mouth at bedtime. 03/14/18   Tower, Wynelle Fanny, MD  magnesium citrate SOLN Take 296 mLs (1 Bottle total) by mouth once as needed for up to 1 dose (Constipation). 01/22/18   Arta Silence, MD  nitroGLYCERIN (NITROSTAT) 0.4 MG SL tablet Place 1 tablet (0.4 mg total) under the tongue every 5 (five) minutes as needed for chest pain. 12/08/17   Minna Merritts, MD  nortriptyline (PAMELOR) 75 MG capsule Take 1 capsule (75 mg total) by mouth at bedtime. 09/26/17   Tower, Wynelle Fanny, MD  polyethylene glycol-electrolytes (NULYTELY/GOLYTELY) 420 g solution Take as  directed in split doses. 02/17/18   Virgel Manifold, MD  temazepam (RESTORIL) 7.5 MG capsule Take 4 capsules (30 mg total) by mouth at bedtime as needed for sleep. 01/27/18   Tower, Wynelle Fanny, MD    Allergies as of 02/17/2018 - Review Complete 02/15/2018  Allergen Reaction Noted  . Fosamax [alendronate sodium]  06/28/2014  . Raloxifene hcl Other (See Comments) 09/26/2017    Family History  Problem Relation Age of Onset  . Stroke Father   . Hypertension Father   . Diabetes Mother   . Coronary artery disease Mother     Social History   Socioeconomic History  . Marital status: Widowed    Spouse name: Not on file  . Number of children: Not on file  . Years of  education: Not on file  . Highest education level: Not on file  Occupational History  . Not on file  Social Needs  . Financial resource strain: Not on file  . Food insecurity:    Worry: Not on file    Inability: Not on file  . Transportation needs:    Medical: Not on file    Non-medical: Not on file  Tobacco Use  . Smoking status: Current Every Day Smoker    Packs/day: 0.50    Types: Cigarettes  . Smokeless tobacco: Never Used  . Tobacco comment: Quit 10/09  Substance and Sexual Activity  . Alcohol use: No    Alcohol/week: 0.0 oz  . Drug use: No  . Sexual activity: Not on file  Lifestyle  . Physical activity:    Days per week: Not on file    Minutes per session: Not on file  . Stress: Not on file  Relationships  . Social connections:    Talks on phone: Not on file    Gets together: Not on file    Attends religious service: Not on file    Active member of club or organization: Not on file    Attends meetings of clubs or organizations: Not on file    Relationship status: Not on file  . Intimate partner violence:    Fear of current or ex partner: Not on file    Emotionally abused: Not on file    Physically abused: Not on file    Forced sexual activity: Not on file  Other Topics Concern  . Not on file  Social History Narrative   Marital Status: widowed.    3 children   Lost husband to throat cancer in 8/16 .     Review of Systems: See HPI, otherwise negative ROS  Physical Exam: BP (!) 158/77   Pulse 80   Temp 97.8 F (36.6 C) (Tympanic)   Resp 18   Ht 5\' 3"  (1.6 m)   Wt 110 lb (49.9 kg)   SpO2 99%   BMI 19.49 kg/m  General:   Alert,  pleasant and cooperative in NAD Head:  Normocephalic and atraumatic. Neck:  Supple; no masses or thyromegaly. Lungs:  Clear throughout to auscultation, normal respiratory effort.    Heart:  +S1, +S2, Regular rate and rhythm, No edema. Abdomen:  Soft, nontender and nondistended. Normal bowel sounds, without guarding, and  without rebound.   Neurologic:  Alert and  oriented x4;  grossly normal neurologically.  Impression/Plan: [name] [name] is here for a colonoscopy to be performed for average risk screening and EGD for dysphagia.  Risks, benefits, limitations, and alternatives regarding the procedures have been reviewed with the patient.  Questions have been answered.  All parties agreeable.   Virgel Manifold, MD  03/15/2018, 8:46 AM

## 2018-03-15 NOTE — Anesthesia Post-op Follow-up Note (Signed)
Anesthesia QCDR form completed.        

## 2018-03-15 NOTE — Transfer of Care (Addendum)
Immediate Anesthesia Transfer of Care Note  Patient: Naureen L Grattan  Procedure(s) Performed: Procedure(s): COLONOSCOPY WITH PROPOFOL (N/A) ESOPHAGOGASTRODUODENOSCOPY (EGD) WITH PROPOFOL (N/A)  Patient Location: PACU and Endoscopy Unit  Anesthesia Type:General  Level of Consciousness: sedated  Airway & Oxygen Therapy: Patient Spontanous Breathing and Patient connected to nasal cannula oxygen  Post-op Assessment: Report given to RN and Post -op Vital signs reviewed and stable  Post vital signs: Reviewed and stable  Last Vitals:  Vitals:   03/15/18 0826 03/15/18 1010  BP: (!) 158/77 117/63  Pulse: 80 73  Resp: 18 16  Temp: 36.6 C (!) 35.7 C  SpO2: 49% 753%    Complications: No apparent anesthesia complications

## 2018-03-15 NOTE — Anesthesia Postprocedure Evaluation (Signed)
Anesthesia Post Note  Patient: [name] [name]  Procedure(s) Performed: COLONOSCOPY WITH PROPOFOL (N/A ) ESOPHAGOGASTRODUODENOSCOPY (EGD) WITH PROPOFOL (N/A )  Patient location during evaluation: PACU Anesthesia Type: General Level of consciousness: awake and alert Pain management: pain level controlled Vital Signs Assessment: post-procedure vital signs reviewed and stable Respiratory status: spontaneous breathing, nonlabored ventilation, respiratory function stable and patient connected to nasal cannula oxygen Cardiovascular status: blood pressure returned to baseline and stable Postop Assessment: no apparent nausea or vomiting Anesthetic complications: no     Last Vitals:  Vitals:   03/15/18 0826 03/15/18 1010  BP: (!) 158/77 117/63  Pulse: 80 73  Resp: 18 16  Temp: 36.6 C (!) 35.7 C  SpO2: 99% 100%    Last Pain:  Vitals:   03/15/18 1010  TempSrc: Tympanic  PainSc:                  [name]

## 2018-03-16 LAB — SURGICAL PATHOLOGY

## 2018-03-19 ENCOUNTER — Encounter: Payer: Self-pay | Admitting: Gastroenterology

## 2018-03-23 ENCOUNTER — Other Ambulatory Visit: Payer: Self-pay | Admitting: *Deleted

## 2018-03-23 NOTE — Telephone Encounter (Signed)
Too early for a refill  Is she changing pharmacies?

## 2018-03-23 NOTE — Telephone Encounter (Addendum)
Name of Medication: Lorazepam Name of Pharmacy: [facility] or Written Date and Quantity:  30 tablet 0 03/14/2018  Last Office Visit and Type: 01/24/18 Acute Next Office Visit and Type:  09/2018 AWV and CPE Last Controlled Substance Agreement Date: 07/19/16 Last UDS:  07/09/16

## 2018-03-24 MED ORDER — LORAZEPAM 1 MG PO TABS: 1.0000 mg | ORAL_TABLET | Freq: Every day | ORAL | 0 refills | Status: DC

## 2018-03-24 NOTE — Telephone Encounter (Signed)
No change in pharmacy. Still using [facility] mail order. The last rx was #30.  This request is for #90

## 2018-03-29 DIAGNOSIS — S32010A Wedge compression fracture of first lumbar vertebra, initial encounter for closed fracture: Secondary | ICD-10-CM | POA: Diagnosis not present

## 2018-03-29 DIAGNOSIS — M7061 Trochanteric bursitis, right hip: Secondary | ICD-10-CM | POA: Diagnosis not present

## 2018-04-05 ENCOUNTER — Emergency Department: Payer: Medicare HMO

## 2018-04-05 ENCOUNTER — Encounter: Payer: Self-pay | Admitting: *Deleted

## 2018-04-05 ENCOUNTER — Other Ambulatory Visit: Payer: Self-pay

## 2018-04-05 ENCOUNTER — Emergency Department
Admission: EM | Admit: 2018-04-05 | Discharge: 2018-04-05 | Disposition: A | Discharge status: Home / Self Care | Payer: Medicare HMO | Attending: Emergency Medicine | Admitting: Emergency Medicine

## 2018-04-05 DIAGNOSIS — R1110 Vomiting, unspecified: Secondary | ICD-10-CM | POA: Diagnosis not present

## 2018-04-05 DIAGNOSIS — I10 Essential (primary) hypertension: Secondary | ICD-10-CM | POA: Diagnosis not present

## 2018-04-05 DIAGNOSIS — J449 Chronic obstructive pulmonary disease, unspecified: Secondary | ICD-10-CM | POA: Diagnosis not present

## 2018-04-05 DIAGNOSIS — N3001 Acute cystitis with hematuria: Secondary | ICD-10-CM

## 2018-04-05 DIAGNOSIS — R109 Unspecified abdominal pain: Secondary | ICD-10-CM | POA: Insufficient documentation

## 2018-04-05 DIAGNOSIS — E039 Hypothyroidism, unspecified: Secondary | ICD-10-CM | POA: Insufficient documentation

## 2018-04-05 DIAGNOSIS — R05 Cough: Secondary | ICD-10-CM | POA: Insufficient documentation

## 2018-04-05 DIAGNOSIS — F17210 Nicotine dependence, cigarettes, uncomplicated: Secondary | ICD-10-CM | POA: Diagnosis not present

## 2018-04-05 DIAGNOSIS — J069 Acute upper respiratory infection, unspecified: Secondary | ICD-10-CM | POA: Diagnosis not present

## 2018-04-05 DIAGNOSIS — R6883 Chills (without fever): Secondary | ICD-10-CM | POA: Insufficient documentation

## 2018-04-05 DIAGNOSIS — R079 Chest pain, unspecified: Secondary | ICD-10-CM | POA: Diagnosis not present

## 2018-04-05 DIAGNOSIS — R197 Diarrhea, unspecified: Secondary | ICD-10-CM | POA: Diagnosis not present

## 2018-04-05 DIAGNOSIS — R070 Pain in throat: Secondary | ICD-10-CM | POA: Diagnosis not present

## 2018-04-05 LAB — COMPREHENSIVE METABOLIC PANEL
ALT: 23 U/L (ref 0–44)
AST: 29 U/L (ref 15–41)
Albumin: 4.1 g/dL (ref 3.5–5.0)
Alkaline Phosphatase: 51 U/L (ref 38–126)
Anion gap: 7 (ref 5–15)
BUN: 17 mg/dL (ref 8–23)
CO2: 29 mmol/L (ref 22–32)
Calcium: 8.9 mg/dL (ref 8.9–10.3)
Chloride: 106 mmol/L (ref 98–111)
Creatinine, Ser: 0.85 mg/dL (ref 0.44–1.00)
GFR calc Af Amer: >60 mL/min (ref >60)
GFR calc non Af Amer: >60 mL/min (ref >60)
Glucose, Bld: 94 mg/dL (ref 70–99)
Potassium: 3.6 mmol/L (ref 3.5–5.1)
Sodium: 142 mmol/L (ref 135–145)
Total Bilirubin: 0.5 mg/dL (ref 0.3–1.2)
Total Protein: 7.1 g/dL (ref 6.5–8.1)

## 2018-04-05 LAB — URINALYSIS, COMPLETE (UACMP) WITH MICROSCOPIC
Bilirubin Urine: NEGATIVE
Glucose, UA: NEGATIVE mg/dL
Ketones, ur: 5 mg/dL — AB
Nitrite: NEGATIVE
Protein, ur: NEGATIVE mg/dL
Specific Gravity, Urine: 1.024 (ref 1.005–1.030)
pH: 6 (ref 5.0–8.0)

## 2018-04-05 LAB — CBC
HCT: 42.1 % (ref 35.0–47.0)
Hemoglobin: 14.4 g/dL (ref 12.0–16.0)
MCH: 32 pg (ref 26.0–34.0)
MCHC: 34.1 g/dL (ref 32.0–36.0)
MCV: 93.7 fL (ref 80.0–100.0)
Platelets: 262 10*3/uL (ref 150–440)
RBC: 4.5 MIL/uL (ref 3.80–5.20)
RDW: 15.5 % — ABNORMAL HIGH (ref 11.5–14.5)
WBC: 8.4 10*3/uL (ref 3.6–11.0)

## 2018-04-05 LAB — LIPASE, BLOOD: Lipase: 107 U/L — ABNORMAL HIGH (ref 11–51)

## 2018-04-05 LAB — TROPONIN I: Troponin I: <0.03 ng/mL (ref <0.03)

## 2018-04-05 MED ORDER — ONDANSETRON 4 MG PO TBDP
4.0000 mg | ORAL_TABLET | Freq: Once | ORAL | Status: AC
  Administered 2018-04-05: 4 mg via ORAL
  Filled 2018-04-05: qty 1

## 2018-04-05 MED ORDER — NITROFURANTOIN MONOHYD MACRO 100 MG PO CAPS: 100.0000 mg | ORAL_CAPSULE | Freq: Two times a day (BID) | ORAL | 0 refills | Status: DC

## 2018-04-05 MED ORDER — HYDROCOD POLST-CPM POLST ER 10-8 MG/5ML PO SUER
5.0000 mL | Freq: Once | ORAL | Status: AC
  Administered 2018-04-05: 5 mL via ORAL
  Filled 2018-04-05: qty 5

## 2018-04-05 MED ORDER — ONDANSETRON 4 MG PO TBDP: 4.0000 mg | ORAL_TABLET | Freq: Three times a day (TID) | ORAL | 0 refills | Status: DC | PRN

## 2018-04-05 MED ORDER — HYDROCOD POLST-CPM POLST ER 10-8 MG/5ML PO SUER: 5.0000 mL | Freq: Two times a day (BID) | ORAL | 0 refills | Status: DC

## 2018-04-05 NOTE — ED Provider Notes (Addendum)
[facility] Emergency Department Provider Note       Time seen: ----------------------------------------- 4:25 PM on 04/05/2018 -----------------------------------------   I have reviewed the triage vital signs and the nursing notes.  HISTORY   Chief Complaint Chest Pain and Abdominal Pain    HPI Ariday L Wethington is a 72 y.o. female with a history of COPD, depression, hyperlipidemia, hypertension, osteoarthritis who presents to the ED for chest pain, cough, sore throat, diarrhea, vomiting, abdominal pain and chills.  Patient reports she is not sure if she had a fever at home, arrives afebrile.  She has had a cough but no significant production.  Nothing is made her symptoms better.  Past Medical History:  Diagnosis Date  . COPD (chronic obstructive pulmonary disease) ([place])   . Depression   . Hyperlipidemia   . Hypertension   . Hypothyroidism   . Osteoarthritis   . Osteoporosis   . Shingles    arm 1/12  . Sleep disorder   . Tobacco abuse    past; quit 09    Patient Active Problem List   Diagnosis Date Noted  . Benign neoplasm of ascending colon   . Benign neoplasm of transverse colon   . Melanosis, colon   . Other specified diseases of esophagus   . Stomach irritation   . Constipation 01/24/2018  . Compression fracture of L1 vertebra (HCC) 01/24/2018  . Smoker 01/09/2018  . Anxiety 01/09/2018  . Unstable angina ([place]) 12/23/2017  . Coronary artery calcification seen on CAT scan 11/25/2017  . Chest pain 11/22/2017  . Generalized abdominal pain 11/01/2017  . Diarrhea 11/01/2017  . Special screening for malignant neoplasms, colon 11/01/2017  . Estrogen deficiency 09/26/2017  . Leg pain, bilateral 09/26/2017  . Aortic calcification ([place]) 11/30/2016  . History of patellar fracture 11/30/2016  . Routine general medical examination at a health care facility 06/27/2016  . Abdominal bloating 06/15/2016  . Dyspepsia 06/04/2016  . Loss of weight  06/04/2016  . History of vertebral compression fracture 01/19/2016  . Esophageal dysphagia 06/28/2014  . Chronic foot pain 10/27/2011  . Low back pain 04/14/2011  . FEVER BLISTER 11/17/2010  . Essential hypertension 08/18/2009  . BUNION 03/04/2009  . Allergic rhinitis 01/10/2008  . TOBACCO USE, QUIT 02/10/2007  . H/O goiter 02/09/2007  . Hypothyroidism 02/09/2007  . HYPERCHOLESTEROLEMIA 02/09/2007  . DEPRESSION 02/09/2007  . OSTEOARTHRITIS 02/09/2007  . Osteoporosis 02/09/2007  . Disturbance in sleep behavior 02/09/2007    Past Surgical History:  Procedure Laterality Date  . ABDOMINAL HYSTERECTOMY    . BIOPSY THYROID     pos neoplasm, surgery 09/2006  . bunions  06/1998  . COLONOSCOPY WITH PROPOFOL N/A 03/15/2018   Procedure: COLONOSCOPY WITH PROPOFOL;  Surgeon: Virgel Manifold, MD;  Location: [facility] ENDOSCOPY;  Service: Endoscopy;  Laterality: N/A;  . CORONARY ANGIOPLASTY    . CXR-copd, ts partial compression fracture  12/2006  . dexa  12/1996  . ESOPHAGOGASTRODUODENOSCOPY (EGD) WITH PROPOFOL N/A 03/15/2018   Procedure: ESOPHAGOGASTRODUODENOSCOPY (EGD) WITH PROPOFOL;  Surgeon: Virgel Manifold, MD;  Location: [facility] ENDOSCOPY;  Service: Endoscopy;  Laterality: N/A;  . hashimoto  02/1997  . hyerectomy- cervical ca cells    . LEFT HEART CATH AND CORONARY ANGIOGRAPHY Left 12/23/2017   Procedure: LEFT HEART CATH AND CORONARY ANGIOGRAPHY;  Surgeon: Minna Merritts, MD;  Location: [facility];  Service: Cardiovascular;  Laterality: Left;  . left wrist fracture     surgery  . right thyroid lobectomy, goiter,  thyroiditis  12/2006  . stress fractures foot    . THYROIDECTOMY  11/2009    Allergies Fosamax [alendronate sodium] and Raloxifene hcl  Social History Social History   Tobacco Use  . Smoking status: Current Every Day Smoker    Packs/day: 0.50    Types: Cigarettes  . Smokeless tobacco: Never Used  . Tobacco comment: Quit 10/09  Substance Use Topics  .  Alcohol use: No    Alcohol/week: 0.0 oz  . Drug use: No   Review of Systems Constitutional: Positive for fever and chills ENT: Positive for sore throat Cardiovascular: Positive for chest pain Respiratory: Positive for shortness of breath and cough Gastrointestinal: Positive for abdominal pain, vomiting or diarrhea Skin: Negative for rash. Neurological: Positive for weakness  All systems negative/normal/unremarkable except as stated in the HPI  ____________________________________________   PHYSICAL EXAM:  VITAL SIGNS: ED Triage Vitals  Enc Vitals Group     BP 04/05/18 1524 (!) 168/85     Pulse Rate 04/05/18 1524 94     Resp 04/05/18 1524 16     Temp 04/05/18 1524 98.3 F (36.8 C)     Temp Source 04/05/18 1524 Oral     SpO2 04/05/18 1524 100 %     Weight 04/05/18 1525 110 lb (49.9 kg)     Height 04/05/18 1525 5\' 3"  (1.6 m)     Head Circumference --      Peak Flow --      Pain Score 04/05/18 1524 10     Pain Loc --      Pain Edu? --      Excl. in [place]? --    Constitutional: Alert and oriented. Well appearing and in no distress. Eyes: Conjunctivae are normal. Normal extraocular movements. ENT   Head: Normocephalic and atraumatic.   Nose: No congestion/rhinnorhea.   Mouth/Throat: Mucous membranes are moist.   Neck: No stridor. Cardiovascular: Normal rate, regular rhythm. No murmurs, rubs, or gallops. Respiratory: Normal respiratory effort without tachypnea nor retractions.  Trace rhonchi are noted Gastrointestinal: Soft and nontender. Normal bowel sounds Musculoskeletal: Nontender with normal range of motion in extremities. No lower extremity tenderness nor edema. Neurologic:  Normal speech and language. No gross focal neurologic deficits are appreciated.  Skin:  Skin is warm, dry and intact. No rash noted. Psychiatric: Depressed mood and affect ____________________________________________  EKG: Interpreted by me.  Sinus rhythm with a rate of 9 9 bpm,  normal PR interval, normal QRS, normal QT  ____________________________________________  ED COURSE:  As part of my medical decision making, I reviewed the following data within the [place] History obtained from family if available, nursing notes, old chart and ekg, as well as notes from prior ED visits. Patient presented for multiple complaints, we will assess with labs and imaging as indicated at this time.   Procedures ____________________________________________   LABS (pertinent positives/negatives)  Labs Reviewed  LIPASE, BLOOD - Abnormal; Notable for the following components:      Result Value   Lipase 107 (*)    All other components within normal limits  CBC - Abnormal; Notable for the following components:   RDW 15.5 (*)    All other components within normal limits  URINALYSIS, COMPLETE (UACMP) WITH MICROSCOPIC - Abnormal; Notable for the following components:   Color, Urine YELLOW (*)    APPearance CLEAR (*)    Hgb urine dipstick MODERATE (*)    Ketones, ur 5 (*)    Leukocytes, UA SMALL (*)  Bacteria, UA RARE (*)    All other components within normal limits  COMPREHENSIVE METABOLIC PANEL  TROPONIN I    RADIOLOGY  Chest x-ray is unremarkable  ____________________________________________  DIFFERENTIAL DIAGNOSIS   Viral infection, pneumonia, depression, constipation, pancreatitis  FINAL ASSESSMENT AND PLAN  Viral illness, UTI   Plan: The patient had presented for multiple complaints and likely viral illness. Patient's labs were negative with the exception of elevated lipase of uncertain significance and urinary tract infection. Patient's imaging not reveal any acute process.  She will be discharged with symptomatic treatment with Macrobid and is stable for outpatient follow-up with her doctor.   Laurence Aly, MD   Note: This note was generated in part or whole with voice recognition software. Voice recognition is usually  quite accurate but there are transcription errors that can and very often do occur. I apologize for any typographical errors that were not detected and corrected.     Earleen Newport, MD 04/05/18 1657    Earleen Newport, MD 04/05/18 [phone]

## 2018-04-05 NOTE — ED Notes (Signed)
Patient ambulatory to lobby with steady gait and NAD. Verbalized understanding of discharge instructions and follow-up care. 

## 2018-04-05 NOTE — ED Triage Notes (Signed)
Pt to ED reporting chest pains, cough, sore throat, diarrhea, vomiting and abd pain for the past week. Pt reports feeling a chill but is unsure if she had a fever at home. PT is afebrile today. No pain with urination.

## 2018-04-09 IMAGING — DX DG CHEST 2V
2 series · 2 of 2 positions shown · non-contrast
Comparison: Radiographs December 17, 2013.

CLINICAL DATA: Unexplained weight loss.

EXAM:
CHEST  2 VIEW

[chest pa]
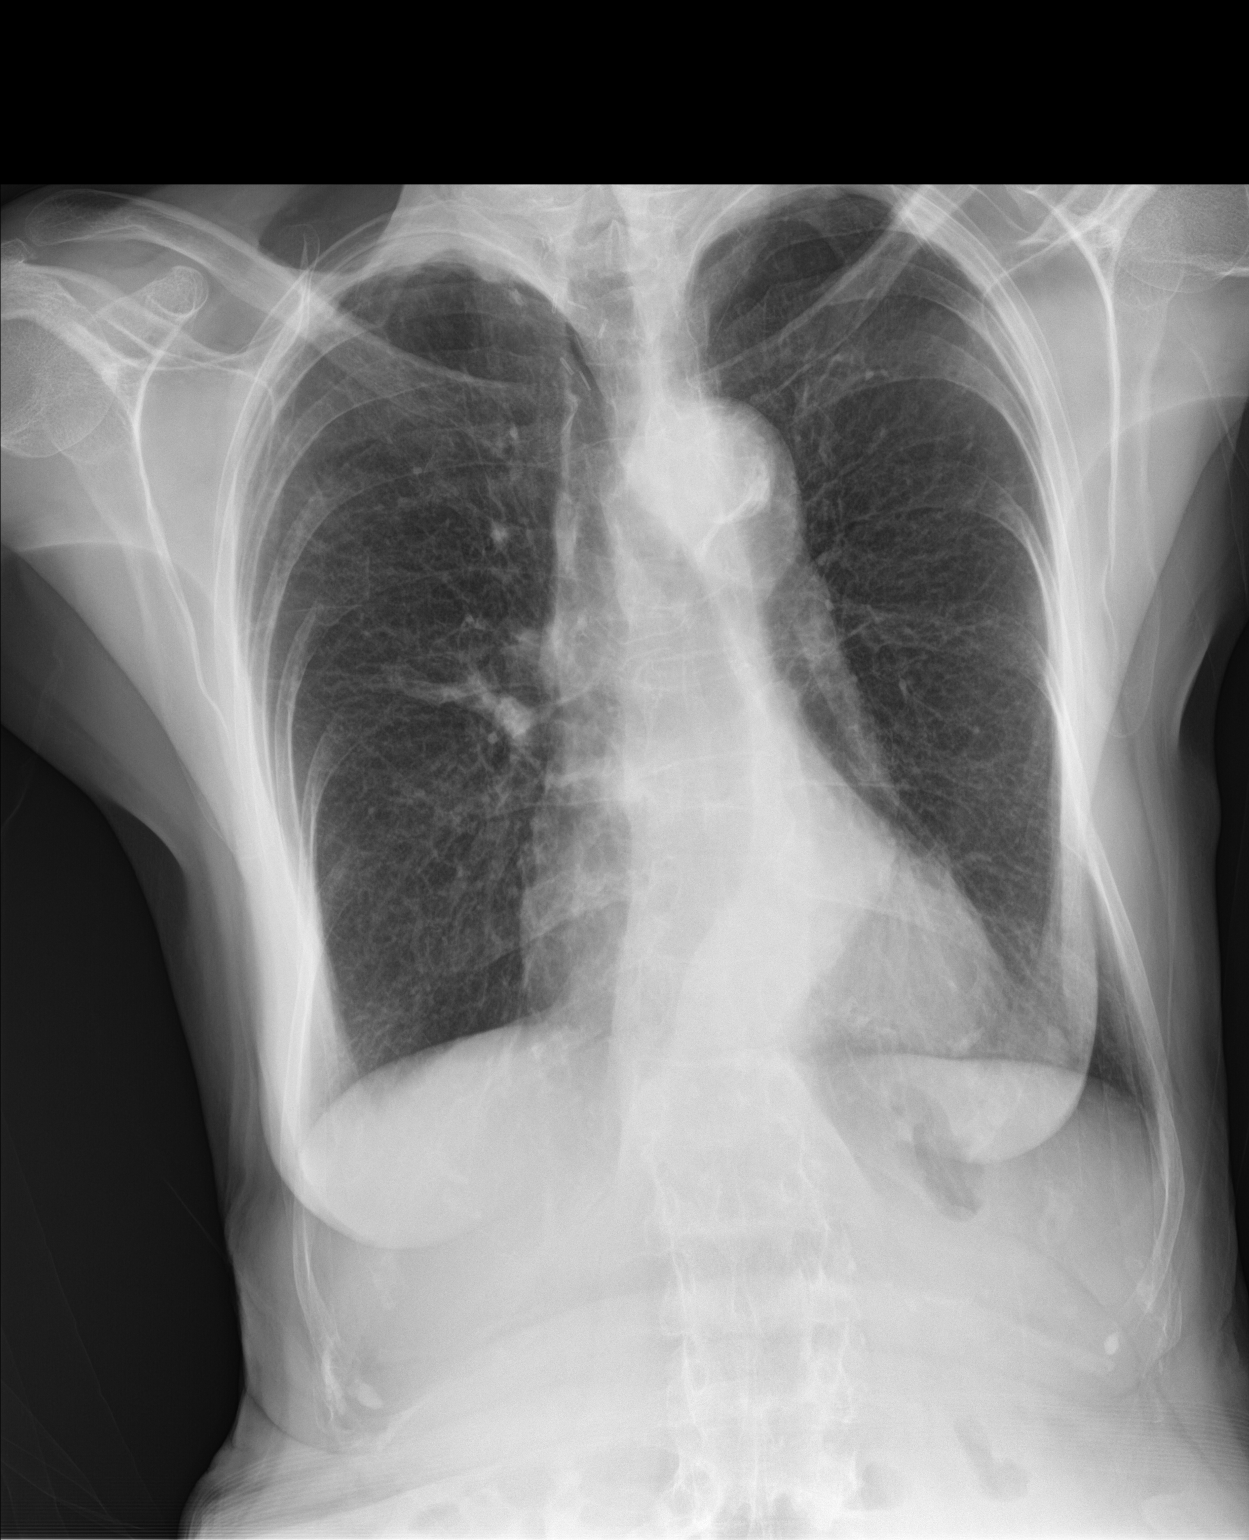

[chest lat]
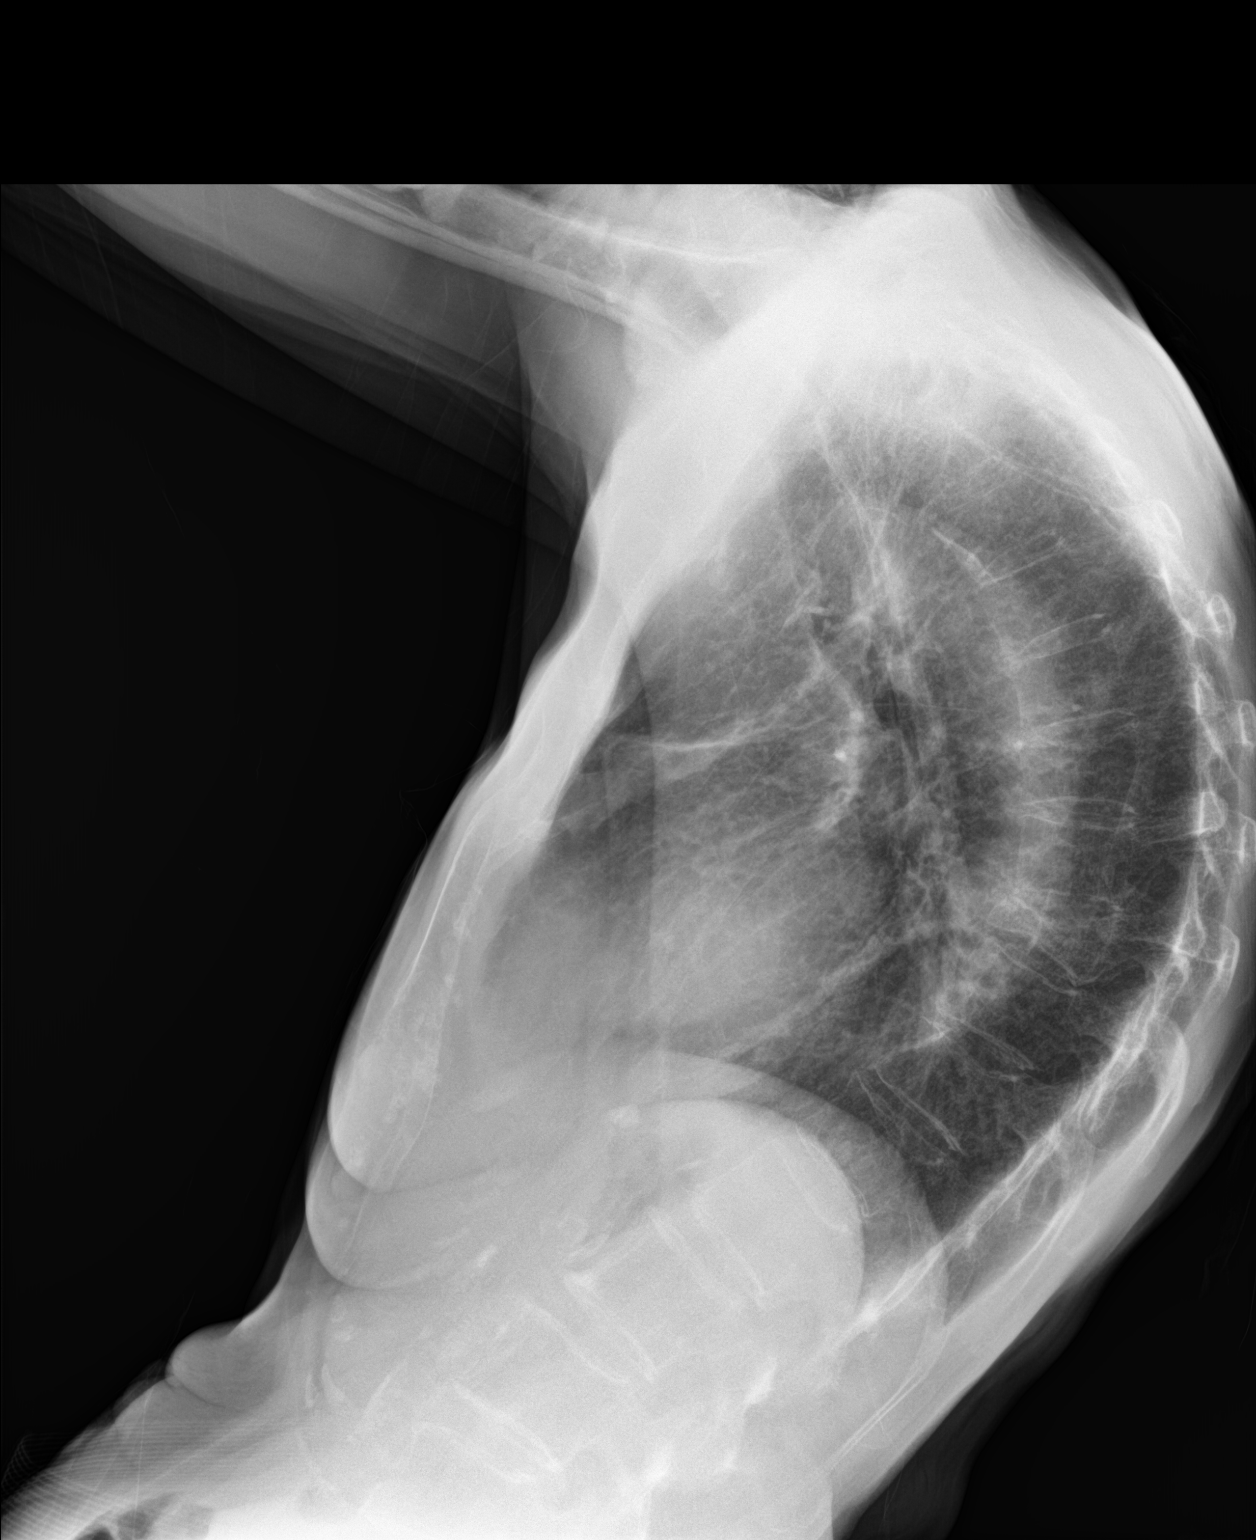

[2 of 2 positions shown; findings below may reference images not displayed]

FINDINGS: Stable cardiomediastinal silhouette. Atherosclerosis of thoracic
aorta is noted. No pneumothorax or pleural effusion is noted.
Interval development of severe compression deformity of mid thoracic
vertebral body consistent with fracture of indeterminate age. Right
midlung subsegmental atelectasis or scarring is noted.
IMPRESSION: Aortic atherosclerosis. Mild right midlung scarring or subsegmental
atelectasis. Interval development of severe compression deformity of
mid thoracic vertebral body consistent with fracture of
indeterminate age; if patient is symptomatic in this area, MRI would
be recommended for further evaluation.

## 2018-04-10 ENCOUNTER — Encounter: Payer: Self-pay | Admitting: Family Medicine

## 2018-04-10 ENCOUNTER — Ambulatory Visit (INDEPENDENT_AMBULATORY_CARE_PROVIDER_SITE_OTHER): Payer: Medicare HMO | Admitting: Family Medicine

## 2018-04-10 VITALS — BP 130/70 | HR 65 | Temp 98.3°F | Ht 63.5 in | Wt 107.5 lb

## 2018-04-10 DIAGNOSIS — J209 Acute bronchitis, unspecified: Secondary | ICD-10-CM | POA: Insufficient documentation

## 2018-04-10 DIAGNOSIS — Z87891 Personal history of nicotine dependence: Secondary | ICD-10-CM | POA: Diagnosis not present

## 2018-04-10 DIAGNOSIS — J0110 Acute frontal sinusitis, unspecified: Secondary | ICD-10-CM | POA: Diagnosis not present

## 2018-04-10 DIAGNOSIS — J440 Chronic obstructive pulmonary disease with acute lower respiratory infection: Secondary | ICD-10-CM | POA: Diagnosis not present

## 2018-04-10 DIAGNOSIS — J019 Acute sinusitis, unspecified: Secondary | ICD-10-CM | POA: Insufficient documentation

## 2018-04-10 MED ORDER — AMOXICILLIN-POT CLAVULANATE 875-125 MG PO TABS: 1.0000 | ORAL_TABLET | Freq: Two times a day (BID) | ORAL | 0 refills | Status: DC

## 2018-04-10 MED ORDER — PREDNISONE 10 MG PO TABS: ORAL_TABLET | ORAL | 0 refills | Status: DC

## 2018-04-10 NOTE — Patient Instructions (Signed)
Take the macrobid for uti 3 more days then stop  Drink lots of fluids Continue to avoid smoking and 2nd hand smoke  Take augmentin- for bronchitis and sinus infection  Prednisone -for bronchitis wheezing and sinus pressure You can still take tylenol if needed  Use the cough medicine as needed  Use your inhaler as needed   Get rest  Update if not starting to improve in a week or if worsening   Especially if fever/ increased cough or worse shortness of breath

## 2018-04-10 NOTE — Progress Notes (Signed)
Subjective:    Patient ID: [name] [name], female    DOB: [birth year], 72 y.o.   MRN: [id number]  HPI Here for uri symptoms   Started with ST and body aches-that got better  Now has cough and congestion   Was seen in ED on 7/31 diag with viral illness as well as uti (treated with macrobic)   Chest xray done  Dg Chest 2 View  Result Date: 04/05/2018 CLINICAL DATA:  Chest pain and cough EXAM: CHEST - 2 VIEW COMPARISON:  11/21/2016 chest radiograph 01/22/2018 lumbar spine radiograph FINDINGS: The lungs are hyperinflated with diffuse interstitial prominence. No focal airspace consolidation or pulmonary edema. No pleural effusion or pneumothorax. Normal cardiomediastinal contours. Midthoracic compression deformity is unchanged. L1 compression fracture is unchanged compared to 01/22/2018. IMPRESSION: COPD without acute airspace disease. Electronically Signed   By: Ulyses Jarred M.D.   On: 04/05/2018 15:51    Known hx of copd  Smoking status - she did quit   Pulse ox is 92% today   Feels some better  Throat does not hurt and no chills and no aches   Right now - congestion and cough  Phlegm- is yellow  Some wheezing - worse at night- albuterol inhaler helps  Sinuses are hurting  Nasal d/c is also yellow (some clear)  Throat is better Ears feel ok   Has nausea- likely from the drainage  Had some vomiting initially-better now    Patient Active Problem List   Diagnosis Date Noted  . Acute bronchitis with COPD ([place]) 04/10/2018  . Acute sinusitis 04/10/2018  . Benign neoplasm of ascending colon   . Benign neoplasm of transverse colon   . Melanosis, colon   . Other specified diseases of esophagus   . Stomach irritation   . Constipation 01/24/2018  . Compression fracture of L1 vertebra ([facility]) 01/24/2018  . Smoker 01/09/2018  . Anxiety 01/09/2018  . Unstable angina ([place]) 12/23/2017  . Coronary artery calcification seen on CAT scan 11/25/2017  . Chest pain 11/22/2017  .  Generalized abdominal pain 11/01/2017  . Diarrhea 11/01/2017  . Special screening for malignant neoplasms, colon 11/01/2017  . Estrogen deficiency 09/26/2017  . Leg pain, bilateral 09/26/2017  . Aortic calcification ([place]) 11/30/2016  . History of patellar fracture 11/30/2016  . Routine general medical examination at a health care facility 06/27/2016  . Abdominal bloating 06/15/2016  . Dyspepsia 06/04/2016  . Loss of weight 06/04/2016  . History of vertebral compression fracture 01/19/2016  . Esophageal dysphagia 06/28/2014  . Chronic foot pain 10/27/2011  . Low back pain 04/14/2011  . FEVER BLISTER 11/17/2010  . Essential hypertension 08/18/2009  . BUNION 03/04/2009  . Allergic rhinitis 01/10/2008  . TOBACCO USE, QUIT 02/10/2007  . H/O goiter 02/09/2007  . Hypothyroidism 02/09/2007  . HYPERCHOLESTEROLEMIA 02/09/2007  . DEPRESSION 02/09/2007  . OSTEOARTHRITIS 02/09/2007  . Osteoporosis 02/09/2007  . Disturbance in sleep behavior 02/09/2007   Past Medical History:  Diagnosis Date  . COPD (chronic obstructive pulmonary disease) ([place])   . Depression   . Hyperlipidemia   . Hypertension   . Hypothyroidism   . Osteoarthritis   . Osteoporosis   . Shingles    arm 1/12  . Sleep disorder   . Tobacco abuse    past; quit 09   Past Surgical History:  Procedure Laterality Date  . ABDOMINAL HYSTERECTOMY    . BIOPSY THYROID     pos neoplasm, surgery 09/2006  . bunions  06/1998  . COLONOSCOPY  WITH PROPOFOL N/A 03/15/2018   Procedure: COLONOSCOPY WITH PROPOFOL;  Surgeon: Virgel Manifold, MD;  Location: [facility] ENDOSCOPY;  Service: Endoscopy;  Laterality: N/A;  . CORONARY ANGIOPLASTY    . CXR-copd, ts partial compression fracture  12/2006  . dexa  12/1996  . ESOPHAGOGASTRODUODENOSCOPY (EGD) WITH PROPOFOL N/A 03/15/2018   Procedure: ESOPHAGOGASTRODUODENOSCOPY (EGD) WITH PROPOFOL;  Surgeon: Virgel Manifold, MD;  Location: [facility] ENDOSCOPY;  Service: Endoscopy;  Laterality: N/A;  .  hashimoto  02/1997  . hyerectomy- cervical ca cells    . LEFT HEART CATH AND CORONARY ANGIOGRAPHY Left 12/23/2017   Procedure: LEFT HEART CATH AND CORONARY ANGIOGRAPHY;  Surgeon: Minna Merritts, MD;  Location: [facility];  Service: Cardiovascular;  Laterality: Left;  . left wrist fracture     surgery  . right thyroid lobectomy, goiter, thyroiditis  12/2006  . stress fractures foot    . THYROIDECTOMY  11/2009   Social History   Tobacco Use  . Smoking status: Former Smoker    Packs/day: 0.50    Types: Cigarettes  . Smokeless tobacco: Never Used  . Tobacco comment: Quit 10/09  Substance Use Topics  . Alcohol use: No    Alcohol/week: 0.0 oz  . Drug use: No   Family History  Problem Relation Age of Onset  . Stroke Father   . Hypertension Father   . Diabetes Mother   . Coronary artery disease Mother    Allergies  Allergen Reactions  . Fosamax [Alendronate Sodium]     Dysphagia and heartburn   . Raloxifene Hcl Other (See Comments)    Leg pain and cramps  Leg pain and cramps    Current Outpatient Medications on File Prior to Visit  Medication Sig Dispense Refill  . albuterol (PROVENTIL HFA;VENTOLIN HFA) 108 (90 BASE) MCG/ACT inhaler Inhale 2 puffs into the lungs every 4 (four) hours as needed for wheezing. 1 Inhaler 11  . atorvastatin (LIPITOR) 80 MG tablet Take 1 tablet (80 mg total) by mouth daily. 90 tablet 3  . Calcium Carbonate-Vit D-Min (CALCIUM 1200 PO) Take 1 tablet by mouth daily.     . chlorpheniramine-HYDROcodone (TUSSIONEX PENNKINETIC ER) 10-8 MG/5ML SUER Take 5 mLs by mouth 2 (two) times daily. 140 mL 0  . lactulose (CHRONULAC) 10 GM/15ML solution Take 15-30 mL by mouth once daily for 3 days or until you have a bowel movement 100 mL 0  . levothyroxine (SYNTHROID, LEVOTHROID) 100 MCG tablet TAKE 1 TABLET EVERY DAY BEFORE BREAKFAST 90 tablet 3  . lisinopril (PRINIVIL,ZESTRIL) 10 MG tablet Take 0.5 tablets (5 mg total) by mouth daily. 45 tablet 3  .  loratadine (CLARITIN) 10 MG tablet Take 1 tablet (10 mg total) by mouth daily.    Marland Kitchen LORazepam (ATIVAN) 1 MG tablet Take 1 tablet (1 mg total) by mouth at bedtime. 90 tablet 0  . magnesium citrate SOLN Take 296 mLs (1 Bottle total) by mouth once as needed for up to 1 dose (Constipation). 296 mL 0  . nitrofurantoin, macrocrystal-monohydrate, (MACROBID) 100 MG capsule Take 1 capsule (100 mg total) by mouth 2 (two) times daily. 20 capsule 0  . nitroGLYCERIN (NITROSTAT) 0.4 MG SL tablet Place 1 tablet (0.4 mg total) under the tongue every 5 (five) minutes as needed for chest pain. 25 tablet 3  . nortriptyline (PAMELOR) 25 MG capsule TAKE 1 CAPSULE AT BEDTIME WITH A 75MG  CAPSULE 90 capsule 3  . nortriptyline (PAMELOR) 75 MG capsule Take 1 capsule (75 mg total) by mouth  at bedtime. 90 capsule 3  . ondansetron (ZOFRAN ODT) 4 MG disintegrating tablet Take 1 tablet (4 mg total) by mouth every 8 (eight) hours as needed for nausea or vomiting. 20 tablet 0  . polyethylene glycol-electrolytes (NULYTELY/GOLYTELY) 420 g solution Take as directed in split doses. 4000 mL 0  . temazepam (RESTORIL) 7.5 MG capsule Take 4 capsules (30 mg total) by mouth at bedtime as needed for sleep. 360 capsule 0  . ezetimibe (ZETIA) 10 MG tablet Take 1 tablet (10 mg total) by mouth daily. 90 tablet 3   No current facility-administered medications on file prior to visit.     Review of Systems  Constitutional: Positive for appetite change and fatigue. Negative for fever.  HENT: Positive for congestion, postnasal drip, rhinorrhea, sinus pressure, sinus pain, sneezing and sore throat. Negative for ear pain and nosebleeds.   Eyes: Negative for pain, discharge, redness and itching.  Respiratory: Positive for cough. Negative for shortness of breath, wheezing and stridor.   Cardiovascular: Negative for chest pain.  Gastrointestinal: Negative for abdominal pain, diarrhea, nausea and vomiting.  Endocrine: Negative for polyuria.    Genitourinary: Negative for dysuria, frequency, hematuria and urgency.  Musculoskeletal: Negative for arthralgias and myalgias.  Skin: Negative for rash.  Allergic/Immunologic: Negative for immunocompromised state.  Neurological: Positive for headaches. Negative for dizziness, tremors, syncope, weakness, light-headedness and numbness.  Hematological: Negative for adenopathy. Does not bruise/bleed easily.  Psychiatric/Behavioral: Negative for confusion and dysphoric mood. The patient is not nervous/anxious.        Objective:   Physical Exam  Constitutional: She appears well-developed and well-nourished. No distress.  Underweight  Not ill appearing today  HENT:  Head: Normocephalic and atraumatic.  Right Ear: External ear normal.  Left Ear: External ear normal.  Mouth/Throat: Oropharynx is clear and moist. No oropharyngeal exudate.  Nares are injected and congested  Bilateral maxillary sinus tenderness  Post nasal drip   Eyes: Pupils are equal, round, and reactive to light. Conjunctivae and EOM are normal. Right eye exhibits no discharge. Left eye exhibits no discharge.  Neck: Normal range of motion. Neck supple.  Cardiovascular: Normal rate and regular rhythm.  Pulmonary/Chest: Effort normal. No stridor. No respiratory distress. She has wheezes. She has no rales. She exhibits no tenderness.  Good air exch  Wheeze on forced expiration  Congested cough  Scattered rhonchi No rales   Abdominal: Soft. Bowel sounds are normal.  Musculoskeletal: She exhibits no edema.  Lymphadenopathy:    She has no cervical adenopathy.  Neurological: She is alert. Coordination normal.  Skin: Skin is warm and dry. No rash noted.  Psychiatric: She has a normal mood and affect.  Pleasant           Assessment & Plan:   Problem List Items Addressed This Visit      Respiratory   Acute bronchitis with COPD ([place]) - Primary    Reviewed ED note from 7/31 Reviewed hospital records, lab results  and studies in detail   Now primarily respiratory symptoms Cover with augmentin (this and sinusitis) Prednisone taper Prior cough medicine  Rest/fluids Update if not starting to improve in a week or if worsening   Commended on again quitting smoking       Relevant Medications   predniSONE (DELTASONE) 10 MG tablet   Acute sinusitis    S/p viral syndrome (also copd with bronchitis)  Facial pain and purulent nasal d/c Recent smoking cessation  Cover with augmentin  Prednisone will also help  Disc  symptomatic care - see instructions on AVS  Update if not starting to improve in a week or if worsening        Relevant Medications   amoxicillin-clavulanate (AUGMENTIN) 875-125 MG tablet   predniSONE (DELTASONE) 10 MG tablet     Other   TOBACCO USE, QUIT    Commended on recently quitting again with resp illness She will make an attempt to stay that way

## 2018-04-10 NOTE — Assessment & Plan Note (Signed)
Reviewed ED note from 7/31 Reviewed hospital records, lab results and studies in detail   Now primarily respiratory symptoms Cover with augmentin (this and sinusitis) Prednisone taper Prior cough medicine  Rest/fluids Update if not starting to improve in a week or if worsening   Commended on again quitting smoking

## 2018-04-10 NOTE — Assessment & Plan Note (Signed)
Commended on recently quitting again with resp illness She will make an attempt to stay that way

## 2018-04-10 NOTE — Assessment & Plan Note (Signed)
S/p viral syndrome (also copd with bronchitis)  Facial pain and purulent nasal d/c Recent smoking cessation  Cover with augmentin  Prednisone will also help  Disc symptomatic care - see instructions on AVS  Update if not starting to improve in a week or if worsening

## 2018-04-19 ENCOUNTER — Ambulatory Visit (INDEPENDENT_AMBULATORY_CARE_PROVIDER_SITE_OTHER): Payer: Medicare HMO | Admitting: Family Medicine

## 2018-04-19 ENCOUNTER — Encounter: Payer: Self-pay | Admitting: Family Medicine

## 2018-04-19 VITALS — BP 136/76 | HR 71 | Temp 98.4°F | Ht 63.5 in | Wt 112.0 lb

## 2018-04-19 DIAGNOSIS — L089 Local infection of the skin and subcutaneous tissue, unspecified: Secondary | ICD-10-CM

## 2018-04-19 DIAGNOSIS — M8000XS Age-related osteoporosis with current pathological fracture, unspecified site, sequela: Secondary | ICD-10-CM | POA: Diagnosis not present

## 2018-04-19 DIAGNOSIS — Z87310 Personal history of (healed) osteoporosis fracture: Secondary | ICD-10-CM

## 2018-04-19 DIAGNOSIS — M810 Age-related osteoporosis without current pathological fracture: Secondary | ICD-10-CM | POA: Diagnosis not present

## 2018-04-19 MED ORDER — CEPHALEXIN 500 MG PO CAPS: 500.0000 mg | ORAL_CAPSULE | Freq: Three times a day (TID) | ORAL | 0 refills | Status: DC
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
## 2018-04-19 NOTE — Patient Instructions (Signed)
Keep toe very clean with soap and water  You do not need to soak it  Cover with gauze if needed  You will likely loose toe nail   Take the keflex (cephalexin) as directed  If increased redness/pain/swelling let me know  We will work on ordering Prolia for a month

## 2018-04-19 NOTE — Assessment & Plan Note (Signed)
Due for prolia in a month
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
## 2018-04-20 ENCOUNTER — Telehealth: Payer: Self-pay | Admitting: *Deleted

## 2018-04-20 NOTE — Telephone Encounter (Signed)
Information has been submitted to pts insurance for verification of benefits. Awaiting response for coverage  

## 2018-05-15 ENCOUNTER — Other Ambulatory Visit: Payer: Self-pay | Admitting: Family Medicine

## 2018-05-15 DIAGNOSIS — M8000XS Age-related osteoporosis with current pathological fracture, unspecified site, sequela: Secondary | ICD-10-CM

## 2018-05-15 NOTE — Telephone Encounter (Signed)
Spoke to pt who is agreeable to out of pocket costs. Ca lab and prolia inj scheduled.

## 2018-05-15 NOTE — Telephone Encounter (Signed)
Verification of benefits have been processed and an approval has been received for pts prolia injection. Pts estimated cost are appx $250. This is only an estimate and cannot be confirmed until benefits are paid. Please advise pt and schedule if needed. If scheduled, once the injection is received, pls contact me back with the date it was received so that I am able to update prolia folder. thanks  

## 2018-05-16 ENCOUNTER — Other Ambulatory Visit (INDEPENDENT_AMBULATORY_CARE_PROVIDER_SITE_OTHER): Payer: Medicare HMO

## 2018-05-16 DIAGNOSIS — M8000XS Age-related osteoporosis with current pathological fracture, unspecified site, sequela: Secondary | ICD-10-CM

## 2018-05-17 LAB — CALCIUM: Calcium: 8.8 mg/dL (ref 8.4–10.5)

## 2018-05-24 ENCOUNTER — Encounter: Payer: Self-pay | Admitting: Family Medicine

## 2018-05-24 ENCOUNTER — Ambulatory Visit (INDEPENDENT_AMBULATORY_CARE_PROVIDER_SITE_OTHER): Payer: Medicare HMO | Admitting: Family Medicine

## 2018-05-24 ENCOUNTER — Ambulatory Visit: Payer: Medicare HMO

## 2018-05-24 VITALS — BP 110/72 | HR 81 | Temp 98.6°F | Ht 63.5 in | Wt 108.5 lb

## 2018-05-24 DIAGNOSIS — R238 Other skin changes: Secondary | ICD-10-CM | POA: Diagnosis not present

## 2018-05-24 DIAGNOSIS — M8000XS Age-related osteoporosis with current pathological fracture, unspecified site, sequela: Secondary | ICD-10-CM | POA: Diagnosis not present

## 2018-05-24 MED ORDER — TRIAMCINOLONE ACETONIDE 0.025 % EX CREA: 1.0000 "application " | TOPICAL_CREAM | Freq: Two times a day (BID) | CUTANEOUS | 1 refills | Status: DC

## 2018-05-24 MED ORDER — DENOSUMAB 60 MG/ML ~~LOC~~ SOSY
60.0000 mg | PREFILLED_SYRINGE | Freq: Once | SUBCUTANEOUS | Status: AC
  Administered 2018-05-24: 60 mg via SUBCUTANEOUS

## 2018-05-24 NOTE — Patient Instructions (Addendum)
I think you may have an allergic reaction to insect bites (perhaps chiggers or mosquitos)  Clean each area well with soap and water  Careful about sun exposure Use insect repellent if needed   Use the triamcinolone cream on the affected areas twice daily   Avoid scented products if you can   Alert [place] if no improvement or if worse or if you develop more spots in different areas  Also if redness or fever - let us know   Prolia shot today

## 2018-05-24 NOTE — Assessment & Plan Note (Signed)
Several blisters (all but one less than 10mm in diameter) scattered on LEs  Some itching and sensitivity  One on R dorsal foot about 1 cm diam-it has drained  Given distribution (low) suspect insect bites  She denies exp to fleas or bed bugs Chiggers are possible due to outdoor time  Px triamcinolone cream 0.025% to use bid Keep areas clean with soap and water and avoid scented products  If worse or areas enlarge or spread to other areas - differential would include bullous pemphigoid (unlikely)  Will update /derm ref if no imp or worse

## 2018-05-24 NOTE — Assessment & Plan Note (Signed)
Prolia shot today

## 2018-05-24 NOTE — Progress Notes (Signed)
Subjective:    Patient ID: [name] [name], female    DOB: 1946/02/15, 72 y.o.   MRN: [id number]  HPI Here for blister - lower extremities  R foot  L ankle  R upper leg   They came up about 2 weeks ago  Itchy , occ burn a bit  No trauma  One on foot drained - clear fluid  No redness   No fever  No other symptoms   No sunburn  No new topical products or soaps Uses a lightly scented body wash   No blisters on upper body   Likes to be outdoors [place]  ? If bug bites    Also wants prolia shot if she can get it today    Wt Readings from Last 3 Encounters:  05/24/18 108 lb 8 oz (49.2 kg)  04/19/18 112 lb (50.8 kg)  04/10/18 107 lb 8 oz (48.8 kg)   18.92 kg/m   Patient Active Problem List   Diagnosis Date Noted  . Blisters of multiple sites 05/24/2018  . Infection of toe 04/19/2018  . Acute bronchitis with COPD ([place]) 04/10/2018  . Benign neoplasm of ascending colon   . Benign neoplasm of transverse colon   . Melanosis, colon   . Other specified diseases of esophagus   . Stomach irritation   . Constipation 01/24/2018  . Compression fracture of L1 vertebra ([facility]) 01/24/2018  . Anxiety 01/09/2018  . Unstable angina ([place]) 12/23/2017  . Coronary artery calcification seen on CAT scan 11/25/2017  . Chest pain 11/22/2017  . Generalized abdominal pain 11/01/2017  . Diarrhea 11/01/2017  . Special screening for malignant neoplasms, colon 11/01/2017  . Estrogen deficiency 09/26/2017  . Leg pain, bilateral 09/26/2017  . Aortic calcification ([place]) 11/30/2016  . History of patellar fracture 11/30/2016  . Routine general medical examination at a health care facility 06/27/2016  . Abdominal bloating 06/15/2016  . Dyspepsia 06/04/2016  . Loss of weight 06/04/2016  . History of vertebral compression fracture 01/19/2016  . Esophageal dysphagia 06/28/2014  . Chronic foot pain 10/27/2011  . Low back pain 04/14/2011  . FEVER BLISTER 11/17/2010  . Essential hypertension  08/18/2009  . BUNION 03/04/2009  . Allergic rhinitis 01/10/2008  . TOBACCO USE, QUIT 02/10/2007  . H/O goiter 02/09/2007  . Hypothyroidism 02/09/2007  . HYPERCHOLESTEROLEMIA 02/09/2007  . DEPRESSION 02/09/2007  . OSTEOARTHRITIS 02/09/2007  . Osteoporosis 02/09/2007  . Disturbance in sleep behavior 02/09/2007   Past Medical History:  Diagnosis Date  . COPD (chronic obstructive pulmonary disease) ([place])   . Depression   . Hyperlipidemia   . Hypertension   . Hypothyroidism   . Osteoarthritis   . Osteoporosis   . Shingles    arm 1/12  . Sleep disorder   . Tobacco abuse    past; quit 09   Past Surgical History:  Procedure Laterality Date  . ABDOMINAL HYSTERECTOMY    . BIOPSY THYROID     pos neoplasm, surgery 09/2006  . bunions  06/1998  . COLONOSCOPY WITH PROPOFOL N/A 03/15/2018   Procedure: COLONOSCOPY WITH PROPOFOL;  Surgeon: Virgel Manifold, MD;  Location: [facility];  Service: Endoscopy;  Laterality: N/A;  . CORONARY ANGIOPLASTY    . CXR-copd, ts partial compression fracture  12/2006  . dexa  12/1996  . ESOPHAGOGASTRODUODENOSCOPY (EGD) WITH PROPOFOL N/A 03/15/2018   Procedure: ESOPHAGOGASTRODUODENOSCOPY (EGD) WITH PROPOFOL;  Surgeon: Virgel Manifold, MD;  Location: [facility];  Service: Endoscopy;  Laterality: N/A;  . hashimoto  02/1997  .  hyerectomy- cervical ca cells    . LEFT HEART CATH AND CORONARY ANGIOGRAPHY Left 12/23/2017   Procedure: LEFT HEART CATH AND CORONARY ANGIOGRAPHY;  Surgeon: Minna Merritts, MD;  Location: [place] [facility];  Service: Cardiovascular;  Laterality: Left;  . left wrist fracture     surgery  . right thyroid lobectomy, goiter, thyroiditis  12/2006  . stress fractures foot    . THYROIDECTOMY  11/2009   Social History   Tobacco Use  . Smoking status: Former Smoker    Packs/day: 0.50    Types: Cigarettes  . Smokeless tobacco: Never Used  . Tobacco comment: Quit 10/09  Substance Use Topics  . Alcohol use: No     Alcohol/week: 0.0 standard drinks  . Drug use: No   Family History  Problem Relation Age of Onset  . Stroke Father   . Hypertension Father   . Diabetes Mother   . Coronary artery disease Mother    Allergies  Allergen Reactions  . Fosamax [Alendronate Sodium]     Dysphagia and heartburn   . Raloxifene Hcl Other (See Comments)    Leg pain and cramps  Leg pain and cramps    Current Outpatient Medications on File Prior to Visit  Medication Sig Dispense Refill  . albuterol (PROVENTIL HFA;VENTOLIN HFA) 108 (90 BASE) MCG/ACT inhaler Inhale 2 puffs into the lungs every 4 (four) hours as needed for wheezing. 1 Inhaler 11  . atorvastatin (LIPITOR) 80 MG tablet Take 1 tablet (80 mg total) by mouth daily. 90 tablet 3  . Calcium Carbonate-Vit D-Min (CALCIUM 1200 PO) Take 1 tablet by mouth daily.     Marland Kitchen lactulose (CHRONULAC) 10 GM/15ML solution Take 15-30 mL by mouth once daily for 3 days or until you have a bowel movement 100 mL 0  . levothyroxine (SYNTHROID, LEVOTHROID) 100 MCG tablet TAKE 1 TABLET EVERY DAY BEFORE BREAKFAST 90 tablet 3  . lisinopril (PRINIVIL,ZESTRIL) 10 MG tablet Take 0.5 tablets (5 mg total) by mouth daily. 45 tablet 3  . loratadine (CLARITIN) 10 MG tablet Take 1 tablet (10 mg total) by mouth daily.    Marland Kitchen LORazepam (ATIVAN) 1 MG tablet Take 1 tablet (1 mg total) by mouth at bedtime. 90 tablet 0  . magnesium citrate SOLN Take 296 mLs (1 Bottle total) by mouth once as needed for up to 1 dose (Constipation). 296 mL 0  . nitroGLYCERIN (NITROSTAT) 0.4 MG SL tablet Place 1 tablet (0.4 mg total) under the tongue every 5 (five) minutes as needed for chest pain. 25 tablet 3  . nortriptyline (PAMELOR) 25 MG capsule TAKE 1 CAPSULE AT BEDTIME WITH A 75MG  CAPSULE 90 capsule 3  . nortriptyline (PAMELOR) 75 MG capsule Take 1 capsule (75 mg total) by mouth at bedtime. 90 capsule 3  . ondansetron (ZOFRAN ODT) 4 MG disintegrating tablet Take 1 tablet (4 mg total) by mouth every 8 (eight) hours as  needed for nausea or vomiting. 20 tablet 0  . polyethylene glycol-electrolytes (NULYTELY/GOLYTELY) 420 g solution Take as directed in split doses. 4000 mL 0  . temazepam (RESTORIL) 7.5 MG capsule Take 4 capsules (30 mg total) by mouth at bedtime as needed for sleep. 360 capsule 0  . ezetimibe (ZETIA) 10 MG tablet Take 1 tablet (10 mg total) by mouth daily. 90 tablet 3   No current facility-administered medications on file prior to visit.     Review of Systems  Constitutional: Negative for activity change, appetite change, fatigue, fever and unexpected weight change.  HENT: Negative for congestion, ear pain, rhinorrhea, sinus pressure and sore throat.   Eyes: Negative for pain, redness and visual disturbance.  Respiratory: Negative for cough, shortness of breath and wheezing.   Cardiovascular: Negative for chest pain and palpitations.  Gastrointestinal: Negative for abdominal pain, blood in stool, constipation and diarrhea.  Endocrine: Negative for polydipsia and polyuria.  Genitourinary: Negative for dysuria, frequency and urgency.  Musculoskeletal: Positive for arthralgias. Negative for back pain and myalgias.  Skin: Negative for pallor and rash.       Blisters   Allergic/Immunologic: Negative for environmental allergies.  Neurological: Negative for dizziness, syncope and headaches.  Hematological: Negative for adenopathy. Does not bruise/bleed easily.  Psychiatric/Behavioral: Negative for decreased concentration and dysphoric mood. The patient is not nervous/anxious.        Objective:   Physical Exam  Constitutional: She appears well-developed and well-nourished. No distress.  Slim and well appearing   HENT:  Head: Normocephalic and atraumatic.  Mouth/Throat: Oropharynx is clear and moist.  Eyes: Pupils are equal, round, and reactive to light. Conjunctivae and EOM are normal.  Neck: Normal range of motion.  Cardiovascular: Normal rate, regular rhythm and normal heart sounds.    Pulmonary/Chest: Effort normal and breath sounds normal. She has no wheezes.  Good air exch  Musculoskeletal:  Baseline kyphosis   Lymphadenopathy:    She has no cervical adenopathy.  Neurological: She is alert. No cranial nerve deficit. Coordination normal.  Skin: Skin is warm. No pallor.  Multiple tiny blisters on LEs- L ankle , R ankle, R lateral thigh - less than 10mm / slt redness Also in between R 2,3rd toes  Larger blister on dorsal R foot at base of 2nd toe - about 1 cm in diameter (recently popped)-only scab remains  No scale  No pattern to suggest poison ivy  No excoriation Tanned with lentigines and solar aging   Psychiatric: She has a normal mood and affect.          Assessment & Plan:   Problem List Items Addressed This Visit      Musculoskeletal and Integument   Osteoporosis    Prolia shot today         Other   Blisters of multiple sites - Primary    Several blisters (all but one less than 39mm in diameter) scattered on LEs  Some itching and sensitivity  One on R dorsal foot about 1 cm diam-it has drained  Given distribution (low) suspect insect bites  She denies exp to fleas or bed bugs Chiggers are possible due to outdoor time  Px triamcinolone cream 0.025% to use bid Keep areas clean with soap and water and avoid scented products  If worse or areas enlarge or spread to other areas - differential would include bullous pemphigoid (unlikely)  Will update /derm ref if no imp or worse

## 2018-08-10 ENCOUNTER — Ambulatory Visit: Payer: Medicare HMO

## 2018-08-21 ENCOUNTER — Ambulatory Visit: Payer: Medicare HMO | Admitting: Gastroenterology

## 2018-08-21 ENCOUNTER — Other Ambulatory Visit: Payer: Self-pay | Admitting: Family Medicine

## 2018-08-22 ENCOUNTER — Ambulatory Visit: Payer: Medicare HMO | Admitting: Gastroenterology

## 2018-08-22 ENCOUNTER — Encounter: Payer: Self-pay | Admitting: Gastroenterology

## 2018-08-22 NOTE — Telephone Encounter (Signed)
Name of Medication: Lorazepam Name of Pharmacy: [facility] or Written Date and Quantity: 03/14/18 #90 tabs with 0 refills Last Office Visit and Type: Blisters on 05/24/18 Next Office Visit and Type: CPE on 09/29/18 (AWV scheduled too) Last Controlled Substance Agreement Date: 07/09/16 Last UDS:07/09/16

## 2018-09-24 ENCOUNTER — Telehealth: Payer: Self-pay | Admitting: Family Medicine

## 2018-09-24 DIAGNOSIS — M8000XS Age-related osteoporosis with current pathological fracture, unspecified site, sequela: Secondary | ICD-10-CM

## 2018-09-24 DIAGNOSIS — E7800 Pure hypercholesterolemia, unspecified: Secondary | ICD-10-CM

## 2018-09-24 DIAGNOSIS — I10 Essential (primary) hypertension: Secondary | ICD-10-CM

## 2018-09-24 DIAGNOSIS — E890 Postprocedural hypothyroidism: Secondary | ICD-10-CM

## 2018-09-24 NOTE — Telephone Encounter (Signed)
-----   Message from Eustace Pen, LPN sent at [phone]/81/7711  3:46 PM EST ----- Regarding: Labs 1/20 Lab orders needed. Thank you.

## 2018-09-25 ENCOUNTER — Telehealth: Payer: Self-pay

## 2018-09-25 ENCOUNTER — Ambulatory Visit: Payer: Medicare HMO

## 2018-09-25 NOTE — Telephone Encounter (Signed)
Multiple phone calls made to reach patient to reschedule AWV appointment. Rescheduled to 09/26/18 @ 1530.  Updated primary phone number.

## 2018-09-26 ENCOUNTER — Ambulatory Visit: Payer: Medicare HMO

## 2018-09-29 ENCOUNTER — Encounter: Payer: Medicare HMO | Admitting: Family Medicine

## 2018-09-29 DIAGNOSIS — Z0289 Encounter for other administrative examinations: Secondary | ICD-10-CM

## 2018-10-12 ENCOUNTER — Other Ambulatory Visit: Payer: Self-pay | Admitting: Family Medicine

## 2018-10-14 ENCOUNTER — Other Ambulatory Visit: Payer: Self-pay | Admitting: Family Medicine

## 2018-10-16 ENCOUNTER — Ambulatory Visit (INDEPENDENT_AMBULATORY_CARE_PROVIDER_SITE_OTHER): Payer: Medicare HMO | Admitting: Family Medicine

## 2018-10-16 ENCOUNTER — Encounter: Payer: Self-pay | Admitting: Family Medicine

## 2018-10-16 VITALS — BP 166/94 | HR 78 | Temp 97.7°F | Ht 62.5 in | Wt 108.2 lb

## 2018-10-16 DIAGNOSIS — Z0001 Encounter for general adult medical examination with abnormal findings: Secondary | ICD-10-CM | POA: Diagnosis not present

## 2018-10-16 DIAGNOSIS — E890 Postprocedural hypothyroidism: Secondary | ICD-10-CM

## 2018-10-16 DIAGNOSIS — Z23 Encounter for immunization: Secondary | ICD-10-CM | POA: Diagnosis not present

## 2018-10-16 DIAGNOSIS — R634 Abnormal weight loss: Secondary | ICD-10-CM

## 2018-10-16 DIAGNOSIS — I700 Atherosclerosis of aorta: Secondary | ICD-10-CM

## 2018-10-16 DIAGNOSIS — E7800 Pure hypercholesterolemia, unspecified: Secondary | ICD-10-CM | POA: Diagnosis not present

## 2018-10-16 DIAGNOSIS — Z8659 Personal history of other mental and behavioral disorders: Secondary | ICD-10-CM

## 2018-10-16 DIAGNOSIS — I10 Essential (primary) hypertension: Secondary | ICD-10-CM

## 2018-10-16 DIAGNOSIS — L989 Disorder of the skin and subcutaneous tissue, unspecified: Secondary | ICD-10-CM | POA: Insufficient documentation

## 2018-10-16 DIAGNOSIS — Z0000 Encounter for general adult medical examination without abnormal findings: Secondary | ICD-10-CM | POA: Insufficient documentation

## 2018-10-16 DIAGNOSIS — M8000XS Age-related osteoporosis with current pathological fracture, unspecified site, sequela: Secondary | ICD-10-CM

## 2018-10-16 DIAGNOSIS — G479 Sleep disorder, unspecified: Secondary | ICD-10-CM | POA: Diagnosis not present

## 2018-10-16 MED ORDER — LISINOPRIL 10 MG PO TABS: 10.0000 mg | ORAL_TABLET | Freq: Every day | ORAL | 3 refills | Status: DC

## 2018-10-16 MED ORDER — ATORVASTATIN CALCIUM 80 MG PO TABS: 80.0000 mg | ORAL_TABLET | Freq: Every day | ORAL | 3 refills | Status: DC

## 2018-10-16 MED ORDER — LEVOTHYROXINE SODIUM 100 MCG PO TABS: ORAL_TABLET | ORAL | 3 refills | Status: DC

## 2018-10-16 MED ORDER — NORTRIPTYLINE HCL 75 MG PO CAPS: 75.0000 mg | ORAL_CAPSULE | Freq: Every day | ORAL | 3 refills | Status: DC

## 2018-10-16 MED ORDER — EZETIMIBE 10 MG PO TABS: 10.0000 mg | ORAL_TABLET | Freq: Every day | ORAL | 3 refills | Status: DC

## 2018-10-16 MED ORDER — NORTRIPTYLINE HCL 25 MG PO CAPS: 25.0000 mg | ORAL_CAPSULE | Freq: Every day | ORAL | 3 refills | Status: DC

## 2018-10-16 NOTE — Assessment & Plan Note (Signed)
Still takes nortryptiline (aware of risks with elderly)  This continues to help mood and sleep

## 2018-10-16 NOTE — Assessment & Plan Note (Signed)
Continue to work on bp and cholesterol control No clinical changes

## 2018-10-16 NOTE — Assessment & Plan Note (Signed)
TSH today  No clinical changes  Adj levothyroxine dose if needed

## 2018-10-16 NOTE — Assessment & Plan Note (Signed)
In pt with h/o vascular dz and past smoking  Disc goals for lipids and reasons to control them Rev last labs with pt Rev low sat fat diet in detail Lipid panel today- on high dose atorvastatin and zetia Also good diet

## 2018-10-16 NOTE — Patient Instructions (Addendum)
If you are interested in the new shingles vaccine (Shingrix) - call your local pharmacy to check on coverage and availability  If affordable, get on a wait list at your pharmacy to get the vaccine.    please look at the blue hand out regarding advance directed (living will and power of attorney)- finish it /get it notarized and then we will put a copy in your chart   Increase lisinopril to a whole pill once daily  Follow up in approx 2 weeks to re check blood pressure   Labs today  Flu shot today

## 2018-10-16 NOTE — Assessment & Plan Note (Signed)
bp is up today  BP: (!) 166/94   repeat and both arms  Will inc lisinopril from 5 to 10 mg daily  inst to alert [place] if side effects or hypotension F/u 2 wk  Disc DASH eating

## 2018-10-16 NOTE — Assessment & Plan Note (Signed)
Reviewed health habits including diet and exercise and skin cancer prevention Reviewed appropriate screening tests for age  Also reviewed health mt list, fam hx and immunization status , as well as social and family history   See HPI Labs reviewed  Pt continues to decline mammogram (nl breast exam today)  Medicare does not pay for tetanus shot- she will return if she has a wound  Declines zoster vaccine dexa utd -tx with prolia  Disc adv directive and materials given to complete this No red flags for cognitive status  Overall doing well  Labs today  Flu shot today

## 2018-10-16 NOTE — Assessment & Plan Note (Signed)
Reviewed health habits including diet and exercise and skin cancer prevention amw reviewed today Reviewed appropriate screening tests for age  Also reviewed health mt list, fam hx and immunization status , as well as social and family history   See HPI Labs reviewed  Pt continues to decline mammogram (nl breast exam today)  Medicare does not pay for tetanus shot- she will return if she has a wound  Declines zoster vaccine dexa utd -tx with prolia  Disc adv directive and materials given to complete this No red flags for cognitive status  Overall doing well  Labs today  Flu shot today

## 2018-10-16 NOTE — Assessment & Plan Note (Signed)
With past hx of vertebral compression fx as well as other fragility fx and past h/o smoking  Taking ca and D D level today No new falls or fractures  Tolerating Prolia well  Plan to continue tx

## 2018-10-16 NOTE — Progress Notes (Addendum)
Subjective:    Patient ID: [name] [name], female    DOB: 1945-09-23, 73 y.o.   MRN: [id number]  HPI  Here for health maintenance exam and to review chronic medical problems  As well as amw   She is still sitting with elderly lady for work  Enjoys it  [employer] ok most of the time  Aches and pains   Wt Readings from Last 3 Encounters:  10/16/18 108 lb 4 oz (49.1 kg)  05/24/18 108 lb 8 oz (49.2 kg)  04/19/18 112 lb (50.8 kg)   19.48 kg/m   I have personally reviewed the Medicare Annual Wellness questionnaire and have noted 1. The patient's medical and social history 2. Their use of alcohol, tobacco or illicit drugs 3. Their current medications and supplements 4. The patient's functional ability including ADL's, fall risks, home safety risks and hearing or visual             impairment. 5. Diet and physical activities 6. Evidence for depression or mood disorders  The patients weight, height, BMI have been recorded in the chart and visual acuity is per eye clinic.  I have made referrals, counseling and provided education to the patient based review of the above and I have provided the pt with a written personalized care plan for preventive services. Reviewed and updated provider list, see scanned forms.  See scanned forms.  Routine anticipatory guidance given to patient.  See health maintenance. Colon cancer screening colonoscopy 7/19 Breast cancer screening -not interested in mammograms  Self breast exam-no lumps  Wants a breast exam today  Flu vaccine -given today  Tetanus vaccine 3/09- postponed due to ins coverage  Pneumovax has had both Zoster vaccine-not interested  dexa 2/19  OP (has had fractures) Prolia  No new fractures  One fall -slipped on step / no injury - she moved that stepping stone and does not use them any more  Advance directive- does not have living will or poa /will work on that  Cognitive function addressed- see scanned forms- and if abnormal then  additional documentation follows. No issues with memory at all  She does misplace things  Not getting confused or lost   PMH and SH reviewed  Meds, vitals, and allergies reviewed.   ROS: See HPI.  Otherwise negative.     Hearing Screening   125Hz  250Hz  500Hz  1000Hz  2000Hz  3000Hz  4000Hz  6000Hz  8000Hz   Right ear:   40 40 40  40    Left ear:   40 40 40  40    Vision Screening Comments: Pt had eye exam with Dr. Kerin Ransom in Dec 2019  bp is up today  No cp or palpitations or headaches or edema  No side effects to medicines  BP Readings from Last 3 Encounters:  10/16/18 (!) 166/94  05/24/18 110/72  04/19/18 136/76    No missed doses of medicine Sees cardiology regularly   Hypothyroidism  Pt has no clinical changes No change in energy level/ hair or skin/ edema and no tremor Lab Results  Component Value Date   TSH 2.12 09/22/2017     Due for labs today   Spot on L face to look at   Continues 1 mg lorazepam for long term sleep issues (ins no longer pays for temazepam)  Still has trouble sleeping /life long   Patient Active Problem List   Diagnosis Date Noted  . Medicare annual wellness visit, subsequent 10/16/2018  . Skin lesion of face 10/16/2018  .  Blisters of multiple sites 05/24/2018  . Infection of toe 04/19/2018  . Benign neoplasm of ascending colon   . Benign neoplasm of transverse colon   . Melanosis, colon   . Other specified diseases of esophagus   . Stomach irritation   . Constipation 01/24/2018  . Compression fracture of L1 vertebra (HCC) 01/24/2018  . Anxiety 01/09/2018  . Unstable angina ([place]) 12/23/2017  . Coronary artery calcification seen on CAT scan 11/25/2017  . Chest pain 11/22/2017  . Generalized abdominal pain 11/01/2017  . Diarrhea 11/01/2017  . Special screening for malignant neoplasms, colon 11/01/2017  . Estrogen deficiency 09/26/2017  . Leg pain, bilateral 09/26/2017  . Aortic calcification ([place]) 11/30/2016  . History of patellar fracture  11/30/2016  . Routine general medical examination at a health care facility 06/27/2016  . Abdominal bloating 06/15/2016  . Dyspepsia 06/04/2016  . Loss of weight 06/04/2016  . History of vertebral compression fracture 01/19/2016  . Esophageal dysphagia 06/28/2014  . Chronic foot pain 10/27/2011  . Low back pain 04/14/2011  . FEVER BLISTER 11/17/2010  . Essential hypertension 08/18/2009  . BUNION 03/04/2009  . Allergic rhinitis 01/10/2008  . TOBACCO USE, QUIT 02/10/2007  . H/O goiter 02/09/2007  . Hypothyroidism 02/09/2007  . HYPERCHOLESTEROLEMIA 02/09/2007  . DEPRESSION 02/09/2007  . OSTEOARTHRITIS 02/09/2007  . Osteoporosis 02/09/2007  . Disturbance in sleep behavior 02/09/2007   Past Medical History:  Diagnosis Date  . COPD (chronic obstructive pulmonary disease) ([place])   . Depression   . Hyperlipidemia   . Hypertension   . Hypothyroidism   . Osteoarthritis   . Osteoporosis   . Shingles    arm 1/12  . Sleep disorder   . Tobacco abuse    past; quit 09   Past Surgical History:  Procedure Laterality Date  . ABDOMINAL HYSTERECTOMY    . BIOPSY THYROID     pos neoplasm, surgery 09/2006  . bunions  06/1998  . COLONOSCOPY WITH PROPOFOL N/A 03/15/2018   Procedure: COLONOSCOPY WITH PROPOFOL;  Surgeon: Virgel Manifold, MD;  Location: [facility] ENDOSCOPY;  Service: Endoscopy;  Laterality: N/A;  . CORONARY ANGIOPLASTY    . CXR-copd, ts partial compression fracture  12/2006  . dexa  12/1996  . ESOPHAGOGASTRODUODENOSCOPY (EGD) WITH PROPOFOL N/A 03/15/2018   Procedure: ESOPHAGOGASTRODUODENOSCOPY (EGD) WITH PROPOFOL;  Surgeon: Virgel Manifold, MD;  Location: [facility] ENDOSCOPY;  Service: Endoscopy;  Laterality: N/A;  . hashimoto  02/1997  . hyerectomy- cervical ca cells    . LEFT HEART CATH AND CORONARY ANGIOGRAPHY Left 12/23/2017   Procedure: LEFT HEART CATH AND CORONARY ANGIOGRAPHY;  Surgeon: Minna Merritts, MD;  Location: [place] [facility];  Service: Cardiovascular;   Laterality: Left;  . left wrist fracture     surgery  . right thyroid lobectomy, goiter, thyroiditis  12/2006  . stress fractures foot    . THYROIDECTOMY  11/2009   Social History   Tobacco Use  . Smoking status: Former Smoker    Packs/day: 0.50    Types: Cigarettes  . Smokeless tobacco: Never Used  . Tobacco comment: Quit 10/09  Substance Use Topics  . Alcohol use: No    Alcohol/week: 0.0 standard drinks  . Drug use: No   Family History  Problem Relation Age of Onset  . Stroke Father   . Hypertension Father   . Diabetes Mother   . Coronary artery disease Mother    Allergies  Allergen Reactions  . Fosamax [Alendronate Sodium]     Dysphagia and  heartburn   . Raloxifene Hcl Other (See Comments)    Leg pain and cramps  Leg pain and cramps    Current Outpatient Medications on File Prior to Visit  Medication Sig Dispense Refill  . albuterol (PROVENTIL HFA;VENTOLIN HFA) 108 (90 BASE) MCG/ACT inhaler Inhale 2 puffs into the lungs every 4 (four) hours as needed for wheezing. 1 Inhaler 11  . Calcium Carbonate-Vit D-Min (CALCIUM 1200 PO) Take 1 tablet by mouth daily.     Marland Kitchen lactulose (CHRONULAC) 10 GM/15ML solution Take 15-30 mL by mouth once daily for 3 days or until you have a bowel movement 100 mL 0  . loratadine (CLARITIN) 10 MG tablet Take 1 tablet (10 mg total) by mouth daily.    Marland Kitchen LORazepam (ATIVAN) 1 MG tablet Take 1 tablet (1 mg total) by mouth at bedtime as needed for sleep. 90 tablet 0  . magnesium citrate SOLN Take 296 mLs (1 Bottle total) by mouth once as needed for up to 1 dose (Constipation). 296 mL 0  . nitroGLYCERIN (NITROSTAT) 0.4 MG SL tablet Place 1 tablet (0.4 mg total) under the tongue every 5 (five) minutes as needed for chest pain. 25 tablet 3  . ondansetron (ZOFRAN ODT) 4 MG disintegrating tablet Take 1 tablet (4 mg total) by mouth every 8 (eight) hours as needed for nausea or vomiting. 20 tablet 0  . polyethylene glycol-electrolytes (NULYTELY/GOLYTELY) 420 g  solution Take as directed in split doses. 4000 mL 0  . triamcinolone (KENALOG) 0.025 % cream Apply 1 application topically 2 (two) times daily. To affected itchy areas 15 g 1   No current facility-administered medications on file prior to visit.      Review of Systems  Constitutional: Negative for activity change, appetite change, fatigue, fever and unexpected weight change.  HENT: Negative for congestion, ear pain, rhinorrhea, sinus pressure and sore throat.   Eyes: Negative for pain, redness and visual disturbance.  Respiratory: Negative for cough, shortness of breath and wheezing.   Cardiovascular: Negative for chest pain and palpitations.  Gastrointestinal: Negative for abdominal pain, blood in stool, constipation and diarrhea.  Endocrine: Negative for polydipsia and polyuria.  Genitourinary: Negative for dysuria, frequency and urgency.  Musculoskeletal: Positive for arthralgias. Negative for back pain and myalgias.       Aches and pains   Skin: Negative for pallor and rash.  Allergic/Immunologic: Negative for environmental allergies.  Neurological: Negative for dizziness, syncope and headaches.  Hematological: Negative for adenopathy. Does not bruise/bleed easily.  Psychiatric/Behavioral: Negative for decreased concentration and dysphoric mood. The patient is not nervous/anxious.        Objective:   Physical Exam Constitutional:      General: She is not in acute distress.    Appearance: Normal appearance. She is well-developed and normal weight. She is not ill-appearing.     Comments: Slim   HENT:     Head: Normocephalic and atraumatic.     Right Ear: Tympanic membrane, ear canal and external ear normal.     Left Ear: Tympanic membrane, ear canal and external ear normal.     Nose: Nose normal.     Mouth/Throat:     Mouth: Mucous membranes are moist.     Pharynx: Oropharynx is clear.  Eyes:     General: No scleral icterus.    Conjunctiva/sclera: Conjunctivae normal.      Pupils: Pupils are equal, round, and reactive to light.  Neck:     Musculoskeletal: Normal range of motion and neck  supple. No muscular tenderness.     Thyroid: No thyromegaly.     Vascular: No carotid bruit or JVD.     Comments: Thyroidectomy scar unchanged  Cardiovascular:     Rate and Rhythm: Normal rate and regular rhythm.     Pulses: Normal pulses.     Heart sounds: Normal heart sounds. No gallop.   Pulmonary:     Effort: Pulmonary effort is normal. No respiratory distress.     Breath sounds: Normal breath sounds. No stridor. No wheezing or rhonchi.     Comments: Diffusely distant bs  Good air exch Chest:     Chest wall: No tenderness.  Abdominal:     General: Bowel sounds are normal. There is no distension or abdominal bruit.     Palpations: Abdomen is soft. There is no mass.     Tenderness: There is no abdominal tenderness.     Hernia: No hernia is present.  Genitourinary:    Comments: Breast exam: No mass, nodules, thickening, tenderness, bulging, retraction, inflamation, nipple discharge or skin changes noted.  No axillary or clavicular LA.     Musculoskeletal: Normal range of motion.        General: No tenderness.     Right lower leg: No edema.     Left lower leg: No edema.     Comments: Kyphosis   Hammer toes Bunion deformities  Lymphadenopathy:     Cervical: No cervical adenopathy.  Skin:    General: Skin is warm and dry.     Capillary Refill: Capillary refill takes less than 2 seconds.     Coloration: Skin is not pale.     Findings: No erythema or rash.     Comments: Solar lentigines diffusely Some sks   Keratotic lesion-pale and 3 mm in diameter L cheek  Smooth papule on tip of nose  Neurological:     General: No focal deficit present.     Mental Status: She is alert.     Cranial Nerves: No cranial nerve deficit.     Motor: No abnormal muscle tone.     Coordination: Coordination normal.     Deep Tendon Reflexes: Reflexes are normal and symmetric.    Psychiatric:        Mood and Affect: Mood normal.     Comments: Mentally sharp           Assessment & Plan:   Problem List Items Addressed This Visit      Cardiovascular and Mediastinum   Essential hypertension    bp is up today  BP: (!) 166/94   repeat and both arms  Will inc lisinopril from 5 to 10 mg daily  inst to alert [place] if side effects or hypotension F/u 2 wk  Disc DASH eating      Relevant Medications   lisinopril (PRINIVIL,ZESTRIL) 10 MG tablet   atorvastatin (LIPITOR) 80 MG tablet   ezetimibe (ZETIA) 10 MG tablet   Other Relevant Orders   CBC with Differential/Platelet   Comprehensive metabolic panel   Lipid panel   TSH   Aortic calcification (HCC)    Continue to work on bp and cholesterol control No clinical changes       Relevant Medications   lisinopril (PRINIVIL,ZESTRIL) 10 MG tablet   atorvastatin (LIPITOR) 80 MG tablet   ezetimibe (ZETIA) 10 MG tablet     Endocrine   Hypothyroidism    TSH today  No clinical changes  Adj levothyroxine dose if needed  Relevant Medications   levothyroxine (SYNTHROID, LEVOTHROID) 100 MCG tablet   Other Relevant Orders   TSH     Musculoskeletal and Integument   Osteoporosis    With past hx of vertebral compression fx as well as other fragility fx and past h/o smoking  Taking ca and D D level today No new falls or fractures  Tolerating Prolia well  Plan to continue tx       Relevant Orders   VITAMIN D 25 Hydroxy (Vit-D Deficiency, Fractures)   Skin lesion of face    Keratotic enlarging area on L cheek Smoother papule on tip of nose  Will ref to derm for eval of both  Past hx of sun exposure        Relevant Orders   Ambulatory referral to Dermatology     Other   HYPERCHOLESTEROLEMIA    In pt with h/o vascular dz and past smoking  Disc goals for lipids and reasons to control them Rev last labs with pt Rev low sat fat diet in detail Lipid panel today- on high dose atorvastatin and  zetia Also good diet       Relevant Medications   lisinopril (PRINIVIL,ZESTRIL) 10 MG tablet   atorvastatin (LIPITOR) 80 MG tablet   ezetimibe (ZETIA) 10 MG tablet   Other Relevant Orders   Lipid panel   History of depression    Still takes nortryptiline (aware of risks with elderly)  This continues to help mood and sleep      Disturbance in sleep behavior   Loss of weight    Pt has always struggled to keep weight on  Disc imp of regular protein with meals and supplements if needed       Routine general medical examination at a health care facility    Reviewed health habits including diet and exercise and skin cancer prevention amw reviewed today Reviewed appropriate screening tests for age  Also reviewed health mt list, fam hx and immunization status , as well as social and family history   See HPI Labs reviewed  Pt continues to decline mammogram (nl breast exam today)  Medicare does not pay for tetanus shot- she will return if she has a wound  Declines zoster vaccine dexa utd -tx with prolia  Disc adv directive and materials given to complete this No red flags for cognitive status  Overall doing well  Labs today  Flu shot today       Medicare annual wellness visit, subsequent - Primary    Reviewed health habits including diet and exercise and skin cancer prevention Reviewed appropriate screening tests for age  Also reviewed health mt list, fam hx and immunization status , as well as social and family history   See HPI Labs reviewed  Pt continues to decline mammogram (nl breast exam today)  Medicare does not pay for tetanus shot- she will return if she has a wound  Declines zoster vaccine dexa utd -tx with prolia  Disc adv directive and materials given to complete this No red flags for cognitive status  Overall doing well  Labs today  Flu shot today         Other Visit Diagnoses    Need for influenza vaccination       Relevant Orders   Flu Vaccine QUAD 6+  mos PF IM (Fluarix Quad PF) (Completed)

## 2018-10-16 NOTE — Assessment & Plan Note (Signed)
Keratotic enlarging area on L cheek Smoother papule on tip of nose  Will ref to derm for eval of both  Past hx of sun exposure

## 2018-10-16 NOTE — Assessment & Plan Note (Signed)
Pt has always struggled to keep weight on  Disc imp of regular protein with meals and supplements if needed

## 2018-10-17 LAB — COMPREHENSIVE METABOLIC PANEL
ALT: 11 U/L (ref 0–35)
AST: 20 U/L (ref 0–37)
Albumin: 4.2 g/dL (ref 3.5–5.2)
Alkaline Phosphatase: 45 U/L (ref 39–117)
BUN: 13 mg/dL (ref 6–23)
CO2: 28 mEq/L (ref 19–32)
Calcium: 9 mg/dL (ref 8.4–10.5)
Chloride: 104 mEq/L (ref 96–112)
Creatinine, Ser: 0.82 mg/dL (ref 0.40–1.20)
GFR: 68.32 mL/min (ref >60.00)
Glucose, Bld: 89 mg/dL (ref 70–99)
Potassium: 4.2 mEq/L (ref 3.5–5.1)
Sodium: 140 mEq/L (ref 135–145)
Total Bilirubin: 0.5 mg/dL (ref 0.2–1.2)
Total Protein: 6.7 g/dL (ref 6.0–8.3)

## 2018-10-17 LAB — CBC WITH DIFFERENTIAL/PLATELET
Basophils Absolute: 0.1 10*3/uL (ref 0.0–0.1)
Basophils Relative: 0.9 % (ref 0.0–3.0)
Eosinophils Absolute: 0.1 10*3/uL (ref 0.0–0.7)
Eosinophils Relative: 1.2 % (ref 0.0–5.0)
HCT: 41.3 % (ref 36.0–46.0)
Hemoglobin: 13.8 g/dL (ref 12.0–15.0)
Lymphocytes Relative: 45.9 % (ref 12.0–46.0)
Lymphs Abs: 2.8 10*3/uL (ref 0.7–4.0)
MCHC: 33.5 g/dL (ref 30.0–36.0)
MCV: 93.1 fl (ref 78.0–100.0)
Monocytes Absolute: 0.6 10*3/uL (ref 0.1–1.0)
Monocytes Relative: 9.6 % (ref 3.0–12.0)
Neutro Abs: 2.6 10*3/uL (ref 1.4–7.7)
Neutrophils Relative %: 42.4 % — ABNORMAL LOW (ref 43.0–77.0)
Platelets: 254 10*3/uL (ref 150.0–400.0)
RBC: 4.43 Mil/uL (ref 3.87–5.11)
RDW: 14.5 % (ref 11.5–15.5)
WBC: 6.2 10*3/uL (ref 4.0–10.5)

## 2018-10-17 LAB — LIPID PANEL
Cholesterol: 218 mg/dL — ABNORMAL HIGH (ref 0–200)
HDL: 75.3 mg/dL (ref >39.00)
LDL Cholesterol: 131 mg/dL — ABNORMAL HIGH (ref 0–99)
NonHDL: 142.53
Total CHOL/HDL Ratio: 3
Triglycerides: 58 mg/dL (ref 0.0–149.0)
VLDL: 11.6 mg/dL (ref 0.0–40.0)

## 2018-10-17 LAB — VITAMIN D 25 HYDROXY (VIT D DEFICIENCY, FRACTURES): VITD: 36.66 ng/mL (ref 30.00–100.00)

## 2018-10-17 LAB — TSH: TSH: 0.16 u[IU]/mL — ABNORMAL LOW (ref 0.35–4.50)

## 2018-10-19 ENCOUNTER — Telehealth: Payer: Self-pay | Admitting: *Deleted

## 2018-10-19 MED ORDER — LEVOTHYROXINE SODIUM 88 MCG PO TABS: 88.0000 ug | ORAL_TABLET | Freq: Every day | ORAL | 11 refills | Status: DC

## 2018-10-19 NOTE — Telephone Encounter (Signed)
Pt notified of lab results and Dr. Marliss Coots comments. Lab appt scheduled and Rx sent to pharmacy

## 2018-10-19 NOTE — Telephone Encounter (Signed)
-----   Message from Abner Greenspan, MD sent at 10/17/2018  9:20 PM EST ----- We need to cut levothy from 100 to 88 mcg  Please send in #30 with 11 ref 1 po qd  Re check tsh 6 wk Other labs are stable

## 2018-10-31 ENCOUNTER — Ambulatory Visit (INDEPENDENT_AMBULATORY_CARE_PROVIDER_SITE_OTHER): Payer: Medicare HMO | Admitting: Family Medicine

## 2018-10-31 ENCOUNTER — Encounter: Payer: Self-pay | Admitting: Family Medicine

## 2018-10-31 VITALS — BP 135/70 | HR 51 | Temp 98.1°F | Ht 62.5 in | Wt 112.2 lb

## 2018-10-31 DIAGNOSIS — I10 Essential (primary) hypertension: Secondary | ICD-10-CM

## 2018-10-31 NOTE — Patient Instructions (Signed)
Blood pressure is improved  Keep eating a healthy balanced diet   Stay on current medicines   Let me know if dizziness persists or worsens /returns   Take care of yourself !

## 2018-10-31 NOTE — Assessment & Plan Note (Addendum)
Improved bp with inc in lisinopril to 10 mg  Good habits and also eating more and healthier  Enc her to continue to be active  Nl exam today- voices occ light headedness- will update if this continues (mildly bradycardic today not on beta blocker- will watch this)

## 2018-10-31 NOTE — Progress Notes (Signed)
Subjective:    Patient ID: [name] [name], female    DOB: 06-Apr-1946, 73 y.o.   MRN: [id number]  HPI Here for a blood pressure check   Wt Readings from Last 3 Encounters:  10/31/18 112 lb 4 oz (50.9 kg)  10/16/18 108 lb 4 oz (49.1 kg)  05/24/18 108 lb 8 oz (49.2 kg)   20.20 kg/m   Last visit bp was up  We increased her lisinopril from 5 to 10 mg daily -no problem  No headaches  occ light headed   Also discussed DASH eating plan   BP Readings from Last 3 Encounters:  10/31/18 (!) 144/86  10/16/18 (!) 166/94  05/24/18 110/72  2nd check today 135/70  Very good   Pulse Readings from Last 3 Encounters:  10/31/18 (!) 51  10/16/18 78  05/24/18 81     Patient Active Problem List   Diagnosis Date Noted  . Medicare annual wellness visit, subsequent 10/16/2018  . Skin lesion of face 10/16/2018  . Benign neoplasm of ascending colon   . Benign neoplasm of transverse colon   . Melanosis, colon   . Other specified diseases of esophagus   . Stomach irritation   . Constipation 01/24/2018  . Compression fracture of L1 vertebra (HCC) 01/24/2018  . Anxiety 01/09/2018  . Coronary artery calcification seen on CAT scan 11/25/2017  . Chest pain 11/22/2017  . Generalized abdominal pain 11/01/2017  . Special screening for malignant neoplasms, colon 11/01/2017  . Estrogen deficiency 09/26/2017  . Aortic calcification ([place]) 11/30/2016  . History of patellar fracture 11/30/2016  . Routine general medical examination at a health care facility 06/27/2016  . Abdominal bloating 06/15/2016  . Dyspepsia 06/04/2016  . Loss of weight 06/04/2016  . History of vertebral compression fracture 01/19/2016  . Esophageal dysphagia 06/28/2014  . Chronic foot pain 10/27/2011  . Low back pain 04/14/2011  . FEVER BLISTER 11/17/2010  . Essential hypertension 08/18/2009  . BUNION 03/04/2009  . Allergic rhinitis 01/10/2008  . TOBACCO USE, QUIT 02/10/2007  . H/O goiter 02/09/2007  . Hypothyroidism  02/09/2007  . HYPERCHOLESTEROLEMIA 02/09/2007  . History of depression 02/09/2007  . OSTEOARTHRITIS 02/09/2007  . Osteoporosis 02/09/2007  . Disturbance in sleep behavior 02/09/2007   Past Medical History:  Diagnosis Date  . COPD (chronic obstructive pulmonary disease) ([place])   . Depression   . Hyperlipidemia   . Hypertension   . Hypothyroidism   . Osteoarthritis   . Osteoporosis   . Shingles    arm 1/12  . Sleep disorder   . Tobacco abuse    past; quit 09   Past Surgical History:  Procedure Laterality Date  . ABDOMINAL HYSTERECTOMY    . BIOPSY THYROID     pos neoplasm, surgery 09/2006  . bunions  06/1998  . COLONOSCOPY WITH PROPOFOL N/A 03/15/2018   Procedure: COLONOSCOPY WITH PROPOFOL;  Surgeon: Virgel Manifold, MD;  Location: [facility] ENDOSCOPY;  Service: Endoscopy;  Laterality: N/A;  . CORONARY ANGIOPLASTY    . CXR-copd, ts partial compression fracture  12/2006  . dexa  12/1996  . ESOPHAGOGASTRODUODENOSCOPY (EGD) WITH PROPOFOL N/A 03/15/2018   Procedure: ESOPHAGOGASTRODUODENOSCOPY (EGD) WITH PROPOFOL;  Surgeon: Virgel Manifold, MD;  Location: [facility] ENDOSCOPY;  Service: Endoscopy;  Laterality: N/A;  . hashimoto  02/1997  . hyerectomy- cervical ca cells    . LEFT HEART CATH AND CORONARY ANGIOGRAPHY Left 12/23/2017   Procedure: LEFT HEART CATH AND CORONARY ANGIOGRAPHY;  Surgeon: Minna Merritts, MD;  Location:  [facility];  Service: Cardiovascular;  Laterality: Left;  . left wrist fracture     surgery  . right thyroid lobectomy, goiter, thyroiditis  12/2006  . stress fractures foot    . THYROIDECTOMY  11/2009   Social History   Tobacco Use  . Smoking status: Former Smoker    Packs/day: 0.50    Types: Cigarettes  . Smokeless tobacco: Never Used  . Tobacco comment: Quit 10/09  Substance Use Topics  . Alcohol use: No    Alcohol/week: 0.0 standard drinks  . Drug use: No   Family History  Problem Relation Age of Onset  . Stroke Father   . Hypertension  Father   . Diabetes Mother   . Coronary artery disease Mother    Allergies  Allergen Reactions  . Fosamax [Alendronate Sodium]     Dysphagia and heartburn   . Raloxifene Hcl Other (See Comments)    Leg pain and cramps  Leg pain and cramps    Current Outpatient Medications on File Prior to Visit  Medication Sig Dispense Refill  . albuterol (PROVENTIL HFA;VENTOLIN HFA) 108 (90 BASE) MCG/ACT inhaler Inhale 2 puffs into the lungs every 4 (four) hours as needed for wheezing. 1 Inhaler 11  . atorvastatin (LIPITOR) 80 MG tablet Take 1 tablet (80 mg total) by mouth daily. 90 tablet 3  . Calcium Carbonate-Vit D-Min (CALCIUM 1200 PO) Take 1 tablet by mouth daily.     Marland Kitchen ezetimibe (ZETIA) 10 MG tablet Take 1 tablet (10 mg total) by mouth daily. 90 tablet 3  . lactulose (CHRONULAC) 10 GM/15ML solution Take 15-30 mL by mouth once daily for 3 days or until you have a bowel movement 100 mL 0  . levothyroxine (SYNTHROID, LEVOTHROID) 88 MCG tablet Take 1 tablet (88 mcg total) by mouth daily. 30 tablet 11  . lisinopril (PRINIVIL,ZESTRIL) 10 MG tablet Take 1 tablet (10 mg total) by mouth daily. 90 tablet 3  . loratadine (CLARITIN) 10 MG tablet Take 1 tablet (10 mg total) by mouth daily.    Marland Kitchen LORazepam (ATIVAN) 1 MG tablet Take 1 tablet (1 mg total) by mouth at bedtime as needed for sleep. 90 tablet 0  . magnesium citrate SOLN Take 296 mLs (1 Bottle total) by mouth once as needed for up to 1 dose (Constipation). 296 mL 0  . nitroGLYCERIN (NITROSTAT) 0.4 MG SL tablet Place 1 tablet (0.4 mg total) under the tongue every 5 (five) minutes as needed for chest pain. 25 tablet 3  . nortriptyline (PAMELOR) 25 MG capsule Take 1 capsule (25 mg total) by mouth at bedtime. Along with the 75 mg capsule 90 capsule 3  . nortriptyline (PAMELOR) 75 MG capsule Take 1 capsule (75 mg total) by mouth at bedtime. 90 capsule 3  . ondansetron (ZOFRAN ODT) 4 MG disintegrating tablet Take 1 tablet (4 mg total) by mouth every 8 (eight)  hours as needed for nausea or vomiting. 20 tablet 0  . polyethylene glycol-electrolytes (NULYTELY/GOLYTELY) 420 g solution Take as directed in split doses. 4000 mL 0  . triamcinolone (KENALOG) 0.025 % cream Apply 1 application topically 2 (two) times daily. To affected itchy areas 15 g 1   No current facility-administered medications on file prior to visit.      Review of Systems  Constitutional: Negative for activity change, appetite change, fatigue, fever and unexpected weight change.  HENT: Negative for congestion, ear pain, rhinorrhea, sinus pressure and sore throat.   Eyes: Negative for pain, redness and  visual disturbance.  Respiratory: Negative for cough, shortness of breath and wheezing.   Cardiovascular: Negative for chest pain and palpitations.  Gastrointestinal: Negative for abdominal pain, blood in stool, constipation and diarrhea.  Endocrine: Negative for polydipsia and polyuria.  Genitourinary: Negative for dysuria, frequency and urgency.  Musculoskeletal: Negative for arthralgias, back pain and myalgias.  Skin: Negative for pallor and rash.  Allergic/Immunologic: Negative for environmental allergies.  Neurological: Positive for light-headedness. Negative for dizziness, syncope and headaches.       Occ light headed Does not think it is positional  Hematological: Negative for adenopathy. Does not bruise/bleed easily.  Psychiatric/Behavioral: Negative for decreased concentration and dysphoric mood. The patient is not nervous/anxious.        Objective:   Physical Exam Constitutional:      General: She is not in acute distress.    Appearance: Normal appearance. She is well-developed and normal weight. She is not ill-appearing.  HENT:     Head: Normocephalic and atraumatic.     Right Ear: Tympanic membrane and ear canal normal.     Left Ear: Tympanic membrane and ear canal normal.     Mouth/Throat:     Mouth: Mucous membranes are moist.     Pharynx: Oropharynx is clear.   Eyes:     Conjunctiva/sclera: Conjunctivae normal.     Pupils: Pupils are equal, round, and reactive to light.     Comments: No nystagmus  Neck:     Musculoskeletal: Normal range of motion and neck supple.     Thyroid: No thyromegaly.     Vascular: No carotid bruit or JVD.  Cardiovascular:     Rate and Rhythm: Regular rhythm. Bradycardia present.     Heart sounds: Normal heart sounds. No gallop.   Pulmonary:     Effort: Pulmonary effort is normal. No respiratory distress.     Breath sounds: Normal breath sounds. No wheezing or rales.  Abdominal:     General: Bowel sounds are normal. There is no distension or abdominal bruit.     Palpations: Abdomen is soft. There is no mass.     Tenderness: There is no abdominal tenderness.  Musculoskeletal:     Right lower leg: No edema.     Left lower leg: No edema.     Comments: Kyphosis   Lymphadenopathy:     Cervical: No cervical adenopathy.  Skin:    General: Skin is warm and dry.     Capillary Refill: Capillary refill takes less than 2 seconds.     Findings: No rash.  Neurological:     General: No focal deficit present.     Mental Status: She is alert.     Motor: No weakness.     Coordination: Coordination normal.     Gait: Gait normal.     Deep Tendon Reflexes: Reflexes are normal and symmetric. Reflexes normal.  Psychiatric:        Mood and Affect: Mood normal.           Assessment & Plan:   Problem List Items Addressed This Visit      Cardiovascular and Mediastinum   Essential hypertension - Primary    Improved bp with inc in lisinopril to 10 mg  Good habits and also eating more and healthier  Enc her to continue to be active  Nl exam today- voices occ light headedness- will update if this continues (mildly bradycardic today not on beta blocker- will watch this)

## 2018-11-10 ENCOUNTER — Telehealth: Payer: Self-pay

## 2018-11-10 NOTE — Telephone Encounter (Signed)
[employer] Benefits submitted to insurance for review, awaiting cost approval.  Once benefits identified will contact patient to review and move forward with scheduling.

## 2018-11-28 ENCOUNTER — Other Ambulatory Visit: Payer: Self-pay | Admitting: *Deleted

## 2018-11-28 MED ORDER — LORAZEPAM 1 MG PO TABS: 1.0000 mg | ORAL_TABLET | Freq: Every evening | ORAL | 0 refills | Status: DC | PRN

## 2018-11-28 NOTE — Telephone Encounter (Signed)
Name of Medication: Lorazepam Name of Pharmacy: [facility] or Written Date and Quantity: 08/22/18 #90 tabs with 0 refills Last Office Visit and Type: Blisters on CPE 10/16/18 Next Office Visit and Type: none scheduled Last Controlled Substance Agreement Date: 07/09/16 Last UDS:07/09/16

## 2018-11-29 DIAGNOSIS — M67431 Ganglion, right wrist: Secondary | ICD-10-CM | POA: Diagnosis not present

## 2018-11-29 DIAGNOSIS — M25531 Pain in right wrist: Secondary | ICD-10-CM | POA: Diagnosis not present

## 2018-11-30 ENCOUNTER — Other Ambulatory Visit: Payer: Self-pay

## 2018-11-30 ENCOUNTER — Other Ambulatory Visit (INDEPENDENT_AMBULATORY_CARE_PROVIDER_SITE_OTHER): Payer: Medicare HMO

## 2018-11-30 DIAGNOSIS — L570 Actinic keratosis: Secondary | ICD-10-CM | POA: Diagnosis not present

## 2018-11-30 DIAGNOSIS — E890 Postprocedural hypothyroidism: Secondary | ICD-10-CM | POA: Diagnosis not present

## 2018-11-30 DIAGNOSIS — I10 Essential (primary) hypertension: Secondary | ICD-10-CM | POA: Diagnosis not present

## 2018-11-30 DIAGNOSIS — L578 Other skin changes due to chronic exposure to nonionizing radiation: Secondary | ICD-10-CM | POA: Diagnosis not present

## 2018-11-30 DIAGNOSIS — L821 Other seborrheic keratosis: Secondary | ICD-10-CM | POA: Diagnosis not present

## 2018-11-30 DIAGNOSIS — L820 Inflamed seborrheic keratosis: Secondary | ICD-10-CM | POA: Diagnosis not present

## 2018-11-30 LAB — TSH: TSH: 0.31 u[IU]/mL — ABNORMAL LOW (ref 0.35–4.50)

## 2018-12-01 ENCOUNTER — Telehealth: Payer: Self-pay | Admitting: *Deleted

## 2018-12-01 ENCOUNTER — Other Ambulatory Visit: Payer: Medicare HMO

## 2018-12-01 MED ORDER — LEVOTHYROXINE SODIUM 75 MCG PO TABS: 75.0000 ug | ORAL_TABLET | Freq: Every day | ORAL | 5 refills | Status: DC

## 2018-12-01 NOTE — Telephone Encounter (Signed)
-----   Message from Abner Greenspan, MD sent at 11/30/2018  8:58 PM EDT ----- I need to cut thyroid dose again  Is on 88 mcg and I want to cut to 75 mcg Please send in levothyroxine 75 mcg 1 po qd #30 5 ref  Re check tsh in about 5-6 wk please

## 2018-12-01 NOTE — Telephone Encounter (Signed)
Pt notified of lab results and Dr. Tower's comments Rx sent to pharmacy and lab appt scheduled  

## 2018-12-25 ENCOUNTER — Ambulatory Visit (INDEPENDENT_AMBULATORY_CARE_PROVIDER_SITE_OTHER): Payer: Medicare HMO | Admitting: Family Medicine

## 2018-12-25 ENCOUNTER — Encounter: Payer: Self-pay | Admitting: Family Medicine

## 2018-12-25 ENCOUNTER — Other Ambulatory Visit: Payer: Self-pay

## 2018-12-25 DIAGNOSIS — B9789 Other viral agents as the cause of diseases classified elsewhere: Secondary | ICD-10-CM

## 2018-12-25 DIAGNOSIS — J069 Acute upper respiratory infection, unspecified: Secondary | ICD-10-CM | POA: Diagnosis not present

## 2018-12-25 NOTE — Progress Notes (Addendum)
Virtual Visit via Video Note  I connected with [name] [name] on 12/25/18 at 10:45 AM EDT by a video enabled telemedicine application and verified that I am speaking with the correct person using two identifiers. The patient is at home today  I am in my office at [place]  Video was attempted and failed today so the visit was conducted by phone    I discussed the limitations of evaluation and management by telemedicine and the availability of in person appointments. The patient expressed understanding and agreed to proceed.  History of Present Illness: Cough and headache  She was exposed to a pt with covid-19  Found out brother and his wife have corona 40 (both in the hospital)  She was around them 2 weeks ago and they were sick at the time   She started having resp symptoms 2 d ago  Cough - is dry /not productive  No wheezing  Gets slt sob when she first gets up in am (no more than usual)  Saturday had diarrhea -gone now  No n/v or abd pain  Some headache (whole head)  No fever (no body aches or chills)  Does not feel very sick  She is getting as much sleep as she can  No otc medicine   Some allergy symptoms sneezing /runny nose and pnd   Prior smoker with h/o COPD   she is up to date with flu and pneumonia vaccines   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  HENT: Positive for congestion. Negative for ear discharge, ear pain, nosebleeds, sinus pain and sore throat.   Eyes: Negative for discharge and redness.  Respiratory: Positive for cough. Negative for hemoptysis, sputum production and wheezing.        Her sob is baseline -just in am   Cardiovascular: Negative for chest pain and palpitations.  Gastrointestinal: Negative for diarrhea, nausea and  vomiting.       Diarrhea is better  Genitourinary: Negative for dysuria.  Musculoskeletal: Negative for myalgias.  Skin: Negative for itching and rash.  Neurological: Positive for headaches. Negative for dizziness and weakness.       Mild headache comes and goes   Endo/Heme/Allergies: Positive for environmental allergies.    Observations/Objective: Patient sounds like herself (pleasant/talkative) No distressed or sick sounding  Not sob with speech and no wheezing heard  No cough during the interview  Not hoarse    Assessment and Plan: Problem List Items Addressed This Visit      Respiratory   Viral URI with cough - Primary    Cannot r/u covid-19 infection (esp in light of exposure 2 wk ago)  Symptoms for 2 d -mainly cough  Some headache/runny nose (she thinks from allergies) No fever or malaise  Re assuring but told pt she needs to isolate herself until cough is gone  inst to call if symptoms worsen at all  Worse cough/wheeze or sob-go to ED if necessary  Continue inhaler if needed  DM cough med like delsym is ok prn Also antihistamine like zyrtec Fluids and rest  Watch for fever No work until symptoms are entirely better We will check in with her in 1-2 days as well           Follow Up Instructions: Stay out of work until  symptoms are better (completely) and isolate yourself  I am glad symptoms are mild but cannot guarantee that you do not have the covid 19 infection at this time DM cough medicine is ok as needed Antihistamine for nasal symptoms ok as well  Lots of fluids and rest  Call asap if symptoms worsen If short of breath go to ED Check your temperature daily or more often to watch for fever We will check in with you in a few days as well     I discussed the assessment and treatment plan with the patient. The patient was provided an opportunity to ask questions and all were answered. The patient agreed with the plan and demonstrated an understanding of the  instructions.   The patient was advised to call back or seek an in-person evaluation if the symptoms worsen or if the condition fails to improve as anticipated.  I spent 15 minutes of non face time with patient for this visit   Loura Pardon, MD

## 2018-12-25 NOTE — Assessment & Plan Note (Signed)
Cannot r/u covid-19 infection (esp in light of exposure 2 wk ago)  Symptoms for 2 d -mainly cough  Some headache/runny nose (she thinks from allergies) No fever or malaise  Re assuring but told pt she needs to isolate herself until cough is gone  inst to call if symptoms worsen at all  Worse cough/wheeze or sob-go to ED if necessary  Continue inhaler if needed  DM cough med like delsym is ok prn Also antihistamine like zyrtec Fluids and rest  Watch for fever No work until symptoms are entirely better We will check in with her in 1-2 days as well

## 2018-12-26 ENCOUNTER — Emergency Department
Admission: EM | Admit: 2018-12-26 | Discharge: 2018-12-26 | Disposition: A | Discharge status: Home / Self Care | Payer: Medicare HMO | Attending: Emergency Medicine | Admitting: Emergency Medicine

## 2018-12-26 ENCOUNTER — Emergency Department: Payer: Medicare HMO

## 2018-12-26 ENCOUNTER — Encounter: Payer: Self-pay | Admitting: Emergency Medicine

## 2018-12-26 ENCOUNTER — Other Ambulatory Visit: Payer: Self-pay

## 2018-12-26 DIAGNOSIS — I10 Essential (primary) hypertension: Secondary | ICD-10-CM | POA: Insufficient documentation

## 2018-12-26 DIAGNOSIS — F17210 Nicotine dependence, cigarettes, uncomplicated: Secondary | ICD-10-CM | POA: Insufficient documentation

## 2018-12-26 DIAGNOSIS — R05 Cough: Secondary | ICD-10-CM | POA: Diagnosis not present

## 2018-12-26 DIAGNOSIS — Z79899 Other long term (current) drug therapy: Secondary | ICD-10-CM | POA: Insufficient documentation

## 2018-12-26 DIAGNOSIS — J449 Chronic obstructive pulmonary disease, unspecified: Secondary | ICD-10-CM | POA: Insufficient documentation

## 2018-12-26 DIAGNOSIS — E039 Hypothyroidism, unspecified: Secondary | ICD-10-CM | POA: Diagnosis not present

## 2018-12-26 DIAGNOSIS — R509 Fever, unspecified: Secondary | ICD-10-CM | POA: Diagnosis not present

## 2018-12-26 LAB — CBC WITH DIFFERENTIAL/PLATELET
Abs Immature Granulocytes: 0.01 10*3/uL (ref 0.00–0.07)
Basophils Absolute: 0 10*3/uL (ref 0.0–0.1)
Basophils Relative: 0 %
Eosinophils Absolute: 0 10*3/uL (ref 0.0–0.5)
Eosinophils Relative: 1 %
HCT: 43.7 % (ref 36.0–46.0)
Hemoglobin: 14.4 g/dL (ref 12.0–15.0)
Immature Granulocytes: 0 %
Lymphocytes Relative: 37 %
Lymphs Abs: 1.4 10*3/uL (ref 0.7–4.0)
MCH: 30.6 pg (ref 26.0–34.0)
MCHC: 33 g/dL (ref 30.0–36.0)
MCV: 93 fL (ref 80.0–100.0)
Monocytes Absolute: 0.4 10*3/uL (ref 0.1–1.0)
Monocytes Relative: 10 %
Neutro Abs: 2 10*3/uL (ref 1.7–7.7)
Neutrophils Relative %: 52 %
Platelets: 201 10*3/uL (ref 150–400)
RBC: 4.7 MIL/uL (ref 3.87–5.11)
RDW: 14 % (ref 11.5–15.5)
WBC: 3.9 10*3/uL — ABNORMAL LOW (ref 4.0–10.5)
nRBC: 0 % (ref 0.0–0.2)

## 2018-12-26 LAB — COMPREHENSIVE METABOLIC PANEL
ALT: 18 U/L (ref 0–44)
AST: 25 U/L (ref 15–41)
Albumin: 3.9 g/dL (ref 3.5–5.0)
Alkaline Phosphatase: 59 U/L (ref 38–126)
Anion gap: 9 (ref 5–15)
BUN: 18 mg/dL (ref 8–23)
CO2: 26 mmol/L (ref 22–32)
Calcium: 8.9 mg/dL (ref 8.9–10.3)
Chloride: 104 mmol/L (ref 98–111)
Creatinine, Ser: 0.74 mg/dL (ref 0.44–1.00)
GFR calc Af Amer: >60 mL/min (ref >60)
GFR calc non Af Amer: >60 mL/min (ref >60)
Glucose, Bld: 97 mg/dL (ref 70–99)
Potassium: 4.3 mmol/L (ref 3.5–5.1)
Sodium: 139 mmol/L (ref 135–145)
Total Bilirubin: 0.5 mg/dL (ref 0.3–1.2)
Total Protein: 7.4 g/dL (ref 6.5–8.1)

## 2018-12-26 LAB — LACTIC ACID, PLASMA: Lactic Acid, Venous: 1.2 mmol/L (ref 0.5–1.9)

## 2018-12-26 LAB — TROPONIN I: Troponin I: <0.03 ng/mL (ref <0.03)

## 2018-12-26 LAB — SARS CORONAVIRUS 2 BY RT PCR (HOSPITAL ORDER, PERFORMED IN ~~LOC~~ HOSPITAL LAB): SARS Coronavirus 2: POSITIVE — AB

## 2018-12-26 MED ORDER — ONDANSETRON 4 MG PO TBDP: 4.0000 mg | ORAL_TABLET | Freq: Three times a day (TID) | ORAL | 0 refills | Status: DC | PRN

## 2018-12-26 MED ORDER — SODIUM CHLORIDE 0.9 % IV BOLUS
1000.0000 mL | Freq: Once | INTRAVENOUS | Status: AC
  Administered 2018-12-26: 1000 mL via INTRAVENOUS

## 2018-12-26 MED ORDER — ONDANSETRON HCL 4 MG/2ML IJ SOLN
4.0000 mg | Freq: Once | INTRAMUSCULAR | Status: AC
  Administered 2018-12-26: 4 mg via INTRAVENOUS
  Filled 2018-12-26: qty 2

## 2018-12-26 MED ORDER — ACETAMINOPHEN 500 MG PO TABS
1000.0000 mg | ORAL_TABLET | Freq: Once | ORAL | Status: AC
  Administered 2018-12-26: 1000 mg via ORAL
  Filled 2018-12-26: qty 2

## 2018-12-26 NOTE — ED Notes (Signed)
Ambulatory sats 100%. resp rate 22 with ambulation.

## 2018-12-26 NOTE — ED Notes (Signed)
Dr. Alfred Levins in to speak with pt.

## 2018-12-26 NOTE — ED Triage Notes (Signed)
Patient arrives via POV with s/s of COVID. Patient says she has been in contact with 3 people who have tested positive for the virus. Reports chills, weakness, diarrhea, upset stomach, and cough.

## 2018-12-26 NOTE — ED Notes (Signed)
Patient discharged home with family, instructed to quarantine self for 14 days due to positive COVID test. Patient provided extensive information regarding ways to prevent spread of virus and verbalized understanding of information. Agrees to quarantine at home. Patient ambulatory, VSS at discharge.

## 2018-12-26 NOTE — ED Provider Notes (Signed)
[facility] Emergency Department Provider Note  ____________________________________________  Time seen: Approximately 8:51 PM  I have reviewed the triage vital signs and the nursing notes.   HISTORY  Chief Complaint Cough; Fever; and Nausea   HPI [name] [name] is a 73 y.o. female with history of COPD, hypertension, hyperlipidemia who presents for evaluation of flulike symptoms.  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  Patient has had 3 days of a dry cough, chills, low-grade fever, nausea, and 1 daily episode of diarrhea.  No chest pain or shortness of breath, no dizziness, no abdominal pain.  Past Medical History:  Diagnosis Date  . COPD (chronic obstructive pulmonary disease) ([place])   . Depression   . Hyperlipidemia   . Hypertension   . Hypothyroidism   . Osteoarthritis   . Osteoporosis   . Shingles    arm 1/12  . Sleep disorder   . Tobacco abuse    past; quit 09    Patient Active Problem List   Diagnosis Date Noted  . Viral URI with cough 12/25/2018  . Medicare annual wellness visit, subsequent 10/16/2018  . Skin lesion of face 10/16/2018  . Benign neoplasm of ascending colon   . Benign neoplasm of transverse colon   . Melanosis, colon   . Other specified diseases of esophagus   . Stomach irritation   . Constipation 01/24/2018  . Compression fracture of L1 vertebra (HCC) 01/24/2018  . Anxiety 01/09/2018  . Coronary artery calcification seen on CAT scan 11/25/2017  . Chest pain 11/22/2017  . Generalized abdominal pain 11/01/2017  . Special screening for malignant neoplasms, colon 11/01/2017  . Estrogen deficiency 09/26/2017  . Aortic calcification ([place]) 11/30/2016  . History of patellar fracture 11/30/2016  . Routine general medical examination at a health care facility 06/27/2016  . Abdominal bloating 06/15/2016  . Dyspepsia  06/04/2016  . Loss of weight 06/04/2016  . History of vertebral compression fracture 01/19/2016  . Esophageal dysphagia 06/28/2014  . Chronic foot pain 10/27/2011  . Low back pain 04/14/2011  . FEVER BLISTER 11/17/2010  . Essential hypertension 08/18/2009  . BUNION 03/04/2009  . Allergic rhinitis 01/10/2008  . TOBACCO USE, QUIT 02/10/2007  . H/O goiter 02/09/2007  . Hypothyroidism 02/09/2007  . HYPERCHOLESTEROLEMIA 02/09/2007  . History of depression 02/09/2007  . OSTEOARTHRITIS 02/09/2007  . Osteoporosis 02/09/2007  . Disturbance in sleep behavior 02/09/2007    Past Surgical History:  Procedure Laterality Date  . ABDOMINAL HYSTERECTOMY    . BIOPSY THYROID     pos neoplasm, surgery 09/2006  . bunions  06/1998  . COLONOSCOPY WITH PROPOFOL N/A 03/15/2018   Procedure: COLONOSCOPY WITH PROPOFOL;  Surgeon: Virgel Manifold, MD;  Location: [facility] ENDOSCOPY;  Service: Endoscopy;  Laterality: N/A;  . CORONARY ANGIOPLASTY    . CXR-copd, ts partial compression fracture  12/2006  . dexa  12/1996  . ESOPHAGOGASTRODUODENOSCOPY (EGD) WITH PROPOFOL N/A 03/15/2018   Procedure: ESOPHAGOGASTRODUODENOSCOPY (EGD) WITH PROPOFOL;  Surgeon: Virgel Manifold, MD;  Location: [facility] ENDOSCOPY;  Service: Endoscopy;  Laterality: N/A;  . hashimoto  02/1997  . hyerectomy- cervical ca cells    . LEFT HEART CATH AND CORONARY ANGIOGRAPHY Left 12/23/2017   Procedure: LEFT HEART CATH AND CORONARY ANGIOGRAPHY;  Surgeon: Minna Merritts, MD;  Location: [facility];  Service: Cardiovascular;  Laterality: Left;  . left wrist fracture     surgery  .  right thyroid lobectomy, goiter, thyroiditis  12/2006  . stress fractures foot    . THYROIDECTOMY  11/2009    Prior to Admission medications   Medication Sig Start Date End Date Taking? Authorizing Provider  albuterol (PROVENTIL HFA;VENTOLIN HFA) 108 (90 BASE) MCG/ACT inhaler Inhale 2 puffs into the lungs every 4 (four) hours as needed for wheezing. 06/28/14    Tower, Wynelle Fanny, MD  atorvastatin (LIPITOR) 80 MG tablet Take 1 tablet (80 mg total) by mouth daily. 10/16/18   Tower, Wynelle Fanny, MD  Calcium Carbonate-Vit D-Min (CALCIUM 1200 PO) Take 1 tablet by mouth daily.     [provider]  ezetimibe (ZETIA) 10 MG tablet Take 1 tablet (10 mg total) by mouth daily. 10/16/18 01/14/19  Tower, Wynelle Fanny, MD  lactulose (CHRONULAC) 10 GM/15ML solution Take 15-30 mL by mouth once daily for 3 days or until you have a bowel movement 01/24/18   Tower, Wynelle Fanny, MD  levothyroxine (SYNTHROID, LEVOTHROID) 75 MCG tablet Take 1 tablet (75 mcg total) by mouth daily. 12/01/18   Tower, Wynelle Fanny, MD  lisinopril (PRINIVIL,ZESTRIL) 10 MG tablet Take 1 tablet (10 mg total) by mouth daily. 10/16/18   Tower, Wynelle Fanny, MD  loratadine (CLARITIN) 10 MG tablet Take 1 tablet (10 mg total) by mouth daily. 04/26/16   Tonia Ghent, MD  LORazepam (ATIVAN) 1 MG tablet Take 1 tablet (1 mg total) by mouth at bedtime as needed for sleep. 11/28/18   Tower, Wynelle Fanny, MD  magnesium citrate SOLN Take 296 mLs (1 Bottle total) by mouth once as needed for up to 1 dose (Constipation). 01/22/18   Arta Silence, MD  nitroGLYCERIN (NITROSTAT) 0.4 MG SL tablet Place 1 tablet (0.4 mg total) under the tongue every 5 (five) minutes as needed for chest pain. 12/08/17   Minna Merritts, MD  nortriptyline (PAMELOR) 25 MG capsule Take 1 capsule (25 mg total) by mouth at bedtime. Along with the 75 mg capsule 10/16/18   Tower, Roque Lias A, MD  nortriptyline (PAMELOR) 75 MG capsule Take 1 capsule (75 mg total) by mouth at bedtime. 10/16/18   Tower, Wynelle Fanny, MD  ondansetron (ZOFRAN ODT) 4 MG disintegrating tablet Take 1 tablet (4 mg total) by mouth every 8 (eight) hours as needed. 12/26/18   Rudene Re, MD  polyethylene glycol-electrolytes (NULYTELY/GOLYTELY) 420 g solution Take as directed in split doses. 02/17/18   Virgel Manifold, MD  triamcinolone (KENALOG) 0.025 % cream Apply 1 application topically 2 (two)  times daily. To affected itchy areas 05/24/18   Tower, Wynelle Fanny, MD    Allergies Fosamax [alendronate sodium] and Raloxifene hcl  Family History  Problem Relation Age of Onset  . Stroke Father   . Hypertension Father   . Diabetes Mother   . Coronary artery disease Mother     Social History Social History   Tobacco Use  . Smoking status: Current Every Day Smoker    Packs/day: 0.50    Types: Cigarettes  . Smokeless tobacco: Never Used  Substance Use Topics  . Alcohol use: No    Alcohol/week: 0.0 standard drinks  . Drug use: No    Review of Systems  Constitutional: + fever, chills Eyes: Negative for visual changes. ENT: Negative for sore throat. Neck: No neck pain  Cardiovascular: Negative for chest pain. Respiratory: Negative for shortness of breath. + cough Gastrointestinal: Negative for abdominal pain, vomiting. + nausea and diarrhea. Genitourinary: Negative for dysuria. Musculoskeletal: Negative for back pain. Skin: Negative for  rash. Neurological: Negative for headaches, weakness or numbness. Psych: No SI or HI  ____________________________________________   PHYSICAL EXAM:  VITAL SIGNS: ED Triage Vitals  Enc Vitals Group     BP 12/26/18 2019 (!) 164/82     Pulse Rate 12/26/18 2019 (!) 103     Resp 12/26/18 2019 20     Temp 12/26/18 2019 100 F (37.8 C)     Temp Source 12/26/18 2019 Oral     SpO2 12/26/18 2019 100 %     Weight 12/26/18 2020 112 lb (50.8 kg)     Height 12/26/18 2020 5\' 2"  (1.575 m)     Head Circumference --      Peak Flow --      Pain Score 12/26/18 2031 0     Pain Loc --      Pain Edu? --      Excl. in [place]? --     Constitutional: Alert and oriented. Well appearing and in no apparent distress. HEENT:      Head: Normocephalic and atraumatic.         Eyes: Conjunctivae are normal. Sclera is non-icteric.       Mouth/Throat: Mucous membranes are moist.       Neck: Supple with no signs of meningismus. Cardiovascular: Regular rate and  rhythm. No murmurs, gallops, or rubs. 2+ symmetrical distal pulses are present in all extremities. No JVD. Respiratory: Normal respiratory effort. Normal sats Gastrointestinal: Soft, non tender, and non distended with positive bowel sounds. No rebound or guarding. Musculoskeletal: Nontender with normal range of motion in all extremities. No edema, cyanosis, or erythema of extremities. Neurologic: Normal speech and language. Face is symmetric. Moving all extremities. No gross focal neurologic deficits are appreciated. Skin: Skin is warm, dry and intact. No rash noted. Psychiatric: Mood and affect are normal. Speech and behavior are normal.  ____________________________________________   LABS (all labs ordered are listed, but only abnormal results are displayed)  Labs Reviewed  SARS CORONAVIRUS 2 (HOSPITAL ORDER, [facility]) - Abnormal; Notable for the following components:      Result Value   SARS Coronavirus 2 POSITIVE (*)    All other components within normal limits  CBC WITH DIFFERENTIAL/PLATELET - Abnormal; Notable for the following components:   WBC 3.9 (*)    All other components within normal limits  LACTIC ACID, PLASMA  COMPREHENSIVE METABOLIC PANEL  TROPONIN I   ____________________________________________  EKG  ED ECG REPORT I, Rudene Re, the attending physician, personally viewed and interpreted this ECG.  Normal sinus rhythm, rate of 89, normal intervals, normal axis, no ST elevations or depressions.  Normal EKG. ____________________________________________  RADIOLOGY  I have personally reviewed the images performed during this visit and I agree with the Radiologist's read.   Interpretation by Radiologist:  Dg Chest Portable 1 View  Result Date: 12/26/2018 CLINICAL DATA:  Cough and fever. EXAM: PORTABLE CHEST 1 VIEW COMPARISON:  04/05/2018 FINDINGS: Heart size and vascularity normal.  Atherosclerotic aortic arch. COPD with  pulmonary hyperinflation and apically scarring bilaterally. No acute infiltrate or effusion. IMPRESSION: COPD with scarring.  No acute abnormality. Electronically Signed   By: Franchot Gallo M.D.   On: 12/26/2018 21:05      ____________________________________________   PROCEDURES  Procedure(s) performed: None Procedures Critical Care performed:  None ____________________________________________   INITIAL IMPRESSION / ASSESSMENT AND PLAN / ED COURSE   73 y.o. female with history of COPD, hypertension, hyperlipidemia who presents for evaluation of flulike  symptoms x 3 days with several known Covid-19 exposures.  Patient is well-appearing and in no distress, normal work of breathing, normal sats, abdomen is soft with no tenderness.  She has a low-grade fever of 100F.  Will check COVID swab, chest x-ray to eval for pneumonia.  EKG and troponin to eval for demand ischemia.  Labs to rule out dehydration, AKI, electrolyte abnormalities.    Clinical Course as of Dec 26 2243  Tue Dec 26, 2018  2243 Covid +. No hypoxia, sating 100% with ambulation on RA, normal WOB, no CP or SOB, normal labs and CXR otherwise. Discussed quarantine with patient for 14 days or 7 days with no symptoms whichever is the longest. Discussed strict return precautions for CP or SOB. Will provide prescription of zofran.   [CV]    Clinical Course User Index [CV] Alfred Levins [place], MD     As part of my medical decision making, I reviewed the following data within the [facility] notes reviewed and incorporated, Labs reviewed , EKG interpreted , Old EKG reviewed, Old chart reviewed, Radiograph reviewed , Notes from prior ED visits and [place] Controlled Substance Database    Pertinent labs & imaging results that were available during my care of the patient were reviewed by me and considered in my medical decision making (see chart for details).    ____________________________________________    FINAL CLINICAL IMPRESSION(S) / ED DIAGNOSES  Final diagnoses:  [id number] virus infection      NEW MEDICATIONS STARTED DURING THIS VISIT:  ED Discharge Orders         Ordered    ondansetron (ZOFRAN ODT) 4 MG disintegrating tablet  Every 8 hours PRN     12/26/18 2245           Note:  This document was prepared using Dragon voice recognition software and may include unintentional dictation errors.    Alfred Levins, [place], MD 12/26/18 [phone]

## 2018-12-27 ENCOUNTER — Emergency Department (HOSPITAL_COMMUNITY)
Admission: EM | Admit: 2018-12-27 | Discharge: 2018-12-27 | Disposition: A | Discharge status: Home / Self Care | Payer: Medicare HMO | Attending: Emergency Medicine | Admitting: Emergency Medicine

## 2018-12-27 ENCOUNTER — Telehealth: Payer: Self-pay | Admitting: Family Medicine

## 2018-12-27 ENCOUNTER — Other Ambulatory Visit: Payer: Self-pay

## 2018-12-27 ENCOUNTER — Encounter (HOSPITAL_COMMUNITY): Payer: Self-pay

## 2018-12-27 DIAGNOSIS — E039 Hypothyroidism, unspecified: Secondary | ICD-10-CM | POA: Insufficient documentation

## 2018-12-27 DIAGNOSIS — I10 Essential (primary) hypertension: Secondary | ICD-10-CM | POA: Insufficient documentation

## 2018-12-27 DIAGNOSIS — B342 Coronavirus infection, unspecified: Secondary | ICD-10-CM | POA: Diagnosis not present

## 2018-12-27 DIAGNOSIS — F17210 Nicotine dependence, cigarettes, uncomplicated: Secondary | ICD-10-CM | POA: Diagnosis not present

## 2018-12-27 DIAGNOSIS — J988 Other specified respiratory disorders: Secondary | ICD-10-CM

## 2018-12-27 DIAGNOSIS — M791 Myalgia, unspecified site: Secondary | ICD-10-CM | POA: Diagnosis present

## 2018-12-27 DIAGNOSIS — Z79899 Other long term (current) drug therapy: Secondary | ICD-10-CM | POA: Diagnosis not present

## 2018-12-27 MED ORDER — BENZONATATE 100 MG PO CAPS: 100.0000 mg | ORAL_CAPSULE | Freq: Three times a day (TID) | ORAL | 0 refills | Status: DC

## 2018-12-27 NOTE — ED Notes (Signed)
Pt educated that corona virus does not have cure at this time . Pt informed that she should stay home and quarantine unless she is having difficulty breathing, rather than exposing more people to the virus. Pt states 'I just feel bad".

## 2018-12-27 NOTE — Telephone Encounter (Signed)
Patient called and says she was in the ED today and was told to call and schedule an appointment with her doctor for Friday. I advised the office is closed and someone will call when they open to schedule the appointment, she verbalized understanding.

## 2018-12-27 NOTE — ED Provider Notes (Signed)
[name] DEPT Provider Note   CSN: [id number] Arrival date & time: 12/27/18  1124    History   Chief Complaint Chief Complaint  Patient presents with  . Generalized [name] [name]    HPI [name] [name] is a 73 y.o. female presenting for evaluation of [name], [name] [name], [name] [name], [name] weakness.  Patient states for the past 4 days, she has been having symptoms [name] not feeling well.  She reports generalized [name] [name] [name] weakness.  She had a [name] last night, unknown T-max.  She reports a nonproductive [name] [name].  She reports associated diarrhea.  She denies sore [name], [name] [name], [name] [name], vomiting, abdominal [name].  She was seen yesterday at [facility], had a full work-up including COVID test, which was positive.  Her x-ray at the time was reassuring.  She was discharged [name] given instructions for quarantine.  Patient states she is here today, she does not feel any better.  She does not feel worse than yesterday.  No increased shortness of breath.  She has not taken anything for her symptoms including Tylenol or ibuprofen.  Patient reports a history of COPD, has been using her inhaler with mild improvement of her symptoms.   Additional history obtained from chart review.  Labs [name] imaging from yesterday reviewed.  Positive COVID test, but otherwise labs are reassuring.  X-ray viewed interpreted by me, no pneumonia.      HPI  Past Medical History:  Diagnosis Date  . COPD (chronic obstructive pulmonary disease) ([place])   . Depression   . Hyperlipidemia   . Hypertension   . Hypothyroidism   . Osteoarthritis   . Osteoporosis   . Shingles    arm 1/12  . Sleep disorder   . Tobacco abuse    past; quit 09    Patient Active Problem List   Diagnosis Date Noted  . Viral URI with [name] [name] 12/25/2018  . Medicare annual wellness visit, subsequent 10/16/2018  . Skin lesion of face 10/16/2018  . Benign neoplasm of ascending colon   . Benign neoplasm of transverse colon   .  Melanosis, colon   . Other specified diseases of esophagus   . Stomach irritation   . Constipation 01/24/2018  . Compression fracture of L1 vertebra (HCC) 01/24/2018  . Anxiety 01/09/2018  . Coronary artery calcification seen on CAT scan 11/25/2017  . [name] [name] 11/22/2017  . Generalized abdominal [name] 11/01/2017  . Special screening for malignant neoplasms, colon 11/01/2017  . Estrogen deficiency 09/26/2017  . Aortic calcification ([place]) 11/30/2016  . History of patellar fracture 11/30/2016  . Routine general medical examination at a health care facility 06/27/2016  . Abdominal bloating 06/15/2016  . Dyspepsia 06/04/2016  . Loss of weight 06/04/2016  . History of vertebral compression fracture 01/19/2016  . Esophageal dysphagia 06/28/2014  . Chronic foot [name] 10/27/2011  . Low back [name] 04/14/2011  . [name] BLISTER 11/17/2010  . Essential hypertension 08/18/2009  . BUNION 03/04/2009  . Allergic rhinitis 01/10/2008  . TOBACCO USE, QUIT 02/10/2007  . H/O goiter 02/09/2007  . Hypothyroidism 02/09/2007  . HYPERCHOLESTEROLEMIA 02/09/2007  . History of depression 02/09/2007  . OSTEOARTHRITIS 02/09/2007  . Osteoporosis 02/09/2007  . Disturbance in sleep behavior 02/09/2007    Past Surgical History:  Procedure Laterality Date  . ABDOMINAL HYSTERECTOMY    . BIOPSY THYROID     pos neoplasm, surgery 09/2006  . bunions  06/1998  . COLONOSCOPY WITH PROPOFOL N/A 03/15/2018   Procedure: COLONOSCOPY WITH PROPOFOL;  Surgeon: Virgel Manifold, MD;  Location: [facility] ENDOSCOPY;  Service: Endoscopy;  Laterality: N/A;  . CORONARY ANGIOPLASTY    . CXR-copd, ts partial compression fracture  12/2006  . dexa  12/1996  . ESOPHAGOGASTRODUODENOSCOPY (EGD) WITH PROPOFOL N/A 03/15/2018   Procedure: ESOPHAGOGASTRODUODENOSCOPY (EGD) WITH PROPOFOL;  Surgeon: Virgel Manifold, MD;  Location: [facility] ENDOSCOPY;  Service: Endoscopy;  Laterality: N/A;  . hashimoto  02/1997  . hyerectomy- cervical ca cells     . LEFT HEART CATH [name] CORONARY ANGIOGRAPHY Left 12/23/2017   Procedure: LEFT HEART CATH [name] CORONARY ANGIOGRAPHY;  Surgeon: Minna Merritts, MD;  Location: [place] [facility];  Service: Cardiovascular;  Laterality: Left;  . left wrist fracture     surgery  . right thyroid lobectomy, goiter, thyroiditis  12/2006  . stress fractures foot    . THYROIDECTOMY  11/2009     OB History   No obstetric history on file.      Home Medications    Prior to Admission medications   Medication Sig Start Date End Date Taking? Authorizing Provider  albuterol (PROVENTIL HFA;VENTOLIN HFA) 108 (90 BASE) MCG/ACT inhaler Inhale 2 puffs into the lungs every 4 (four) hours as needed for wheezing. 06/28/14   Tower, Wynelle Fanny, MD  atorvastatin (LIPITOR) 80 MG tablet Take 1 tablet (80 mg total) by mouth daily. 10/16/18   Tower, Wynelle Fanny, MD  benzonatate (TESSALON) 100 MG capsule Take 1 capsule (100 mg total) by mouth every 8 (eight) hours. 12/27/18   Tanveer Dobberstein, PA-C  Calcium Carbonate-Vit D-Min (CALCIUM 1200 PO) Take 1 tablet by mouth daily.     [provider]  ezetimibe (ZETIA) 10 MG tablet Take 1 tablet (10 mg total) by mouth daily. 10/16/18 01/14/19  Tower, Wynelle Fanny, MD  lactulose (CHRONULAC) 10 GM/15ML solution Take 15-30 mL by mouth once daily for 3 days or until you have a bowel movement 01/24/18   Tower, Wynelle Fanny, MD  levothyroxine (SYNTHROID, LEVOTHROID) 75 MCG tablet Take 1 tablet (75 mcg total) by mouth daily. 12/01/18   Tower, Wynelle Fanny, MD  lisinopril (PRINIVIL,ZESTRIL) 10 MG tablet Take 1 tablet (10 mg total) by mouth daily. 10/16/18   Tower, Wynelle Fanny, MD  loratadine (CLARITIN) 10 MG tablet Take 1 tablet (10 mg total) by mouth daily. 04/26/16   Tonia Ghent, MD  LORazepam (ATIVAN) 1 MG tablet Take 1 tablet (1 mg total) by mouth at bedtime as needed for sleep. 11/28/18   Tower, Wynelle Fanny, MD  magnesium citrate SOLN Take 296 mLs (1 Bottle total) by mouth once as needed for up to 1 dose  (Constipation). 01/22/18   Arta Silence, MD  nitroGLYCERIN (NITROSTAT) 0.4 MG SL tablet Place 1 tablet (0.4 mg total) under the tongue every 5 (five) minutes as needed for [name] [name]. 12/08/17   Minna Merritts, MD  nortriptyline (PAMELOR) 25 MG capsule Take 1 capsule (25 mg total) by mouth at bedtime. Along with the 75 mg capsule 10/16/18   Tower, Roque Lias A, MD  nortriptyline (PAMELOR) 75 MG capsule Take 1 capsule (75 mg total) by mouth at bedtime. 10/16/18   Tower, Wynelle Fanny, MD  ondansetron (ZOFRAN ODT) 4 MG disintegrating tablet Take 1 tablet (4 mg total) by mouth every 8 (eight) hours as needed. 12/26/18   Rudene Re, MD  polyethylene glycol-electrolytes (NULYTELY/GOLYTELY) 420 g solution Take as directed in split doses. 02/17/18   Virgel Manifold, MD  triamcinolone (KENALOG) 0.025 % cream Apply 1 application topically 2 (  two) times daily. To affected itchy areas 05/24/18   Tower, Wynelle Fanny, MD    Family History Family History  Problem Relation Age of Onset  . Stroke Father   . Hypertension Father   . Diabetes Mother   . Coronary artery disease Mother     Social History Social History   Tobacco Use  . Smoking status: Current Every Day Smoker    Packs/day: 0.50    Types: Cigarettes  . Smokeless tobacco: Never Used  Substance Use Topics  . Alcohol use: No    Alcohol/week: 0.0 standard drinks  . Drug use: No     Allergies   Fosamax [alendronate sodium] [name] Raloxifene hcl   Review of Systems Review of Systems  Constitutional: Positive for [name].  Respiratory: Positive for [name] [name] [name] shortness of breath.   Gastrointestinal: Positive for diarrhea.  Musculoskeletal: Positive for myalgias.  Neurological: Positive for weakness.  All other systems reviewed [name] are negative.    Physical Exam Updated Vital Signs BP 118/64   Pulse 75   Temp (!) 97.4 F (36.3 C) (Oral)   Resp 14   Ht 5\' 2"  (1.575 m)   Wt 50.8 kg   SpO2 100%   BMI 20.48 kg/m   Physical Exam  Vitals signs [name] nursing note reviewed.  Constitutional:      General: She is not in acute distress.    Appearance: She is well-developed.     Comments: Elderly female resting comfortably in the bed in no acute distress  HENT:     Head: Normocephalic [name] atraumatic.  Eyes:     Conjunctiva/sclera: Conjunctivae normal.     Pupils: Pupils are equal, round, [name] reactive to light.  Neck:     Musculoskeletal: Normal range of motion [name] neck supple.  Cardiovascular:     Rate [name] Rhythm: Normal rate [name] regular rhythm.  Pulmonary:     Effort: Pulmonary effort is normal. No respiratory distress.     Comments: Speaking in full sentences.  No signs of respiratory distress or increased work of breathing Abdominal:     General: There is no distension.     Palpations: Abdomen is soft.  Musculoskeletal: Normal range of motion.  Skin:    General: Skin is warm [name] dry.     Capillary Refill: Capillary refill takes less than 2 seconds.  Neurological:     Mental Status: She is alert [name] oriented to person, place, [name] time.      ED Treatments / Results  Labs (all labs ordered are listed, but only abnormal results are displayed) Labs Reviewed - No data to display  EKG None  Radiology Dg [name] Portable 1 View  Result Date: 12/26/2018 CLINICAL DATA:  [name] [name] [name] [name]. EXAM: PORTABLE [name] 1 VIEW COMPARISON:  04/05/2018 FINDINGS: Heart size [name] vascularity normal.  Atherosclerotic aortic arch. COPD with pulmonary hyperinflation [name] apically scarring bilaterally. No acute infiltrate or effusion. IMPRESSION: COPD with scarring.  No acute abnormality. Electronically Signed   By: Franchot Gallo M.D.   On: 12/26/2018 21:05    Procedures Procedures (including critical care time)  Medications Ordered in ED Medications - No data to display   Initial Impression / Assessment [name] Plan / ED Course  I have reviewed the triage vital signs [name] the nursing notes.  Pertinent labs & imaging results that  were available during my care of the patient were reviewed by me [name] considered in my medical decision making (see chart for details).  Patient resenting for continued COVID symptoms.  Symptoms have been present for 4 days, are not worsening.  Physical exam reassuring, she is afebrile, sats 100%, no signs of respiratory distress.  Work-up yesterday was reassuring.  Discussed findings from yesterday [name] today.  Discussed that at this time, I do not believe she needs admission to the hospital.  Discussed typical course of COVID symptoms, [name] that she will likely continue to feel poorly.  Discussed symptomatic treatment.  Discussed that she is high risk due to age, COPD, [name] hypertension.  As such, encourage patient to make a telephone appointment with her PCP on Friday for recheck of her symptoms. Case discussed with attending, Dr. Regenia Skeeter agrees to plan.  At this time, patient appears safe for discharge.  Strict return precautions given.  Patient states she understands [name] agrees plan.  Mileydi L Blandon was evaluated in Emergency Department on 12/27/2018 for the symptoms described in the history of present illness. She was evaluated in the context of the global COVID-19 pandemic, which necessitated consideration that the patient might be at risk for infection with the SARS-CoV-2 virus that causes COVID-19. Institutional protocols [name] algorithms that pertain to the evaluation of patients at risk for COVID-19 are in a state of rapid change based on information released by regulatory bodies including the CDC [name] federal [name] state organizations. These policies [name] algorithms were followed during the patient's care in the ED.   Final Clinical Impressions(s) / ED Diagnoses   Final diagnoses:  COVID-19    ED Discharge Orders         Ordered    benzonatate (TESSALON) 100 MG capsule  Every 8 hours     12/27/18 1157           Demorris Choyce, PA-C 12/27/18 1226    Sherwood Gambler, MD  12/27/18 1420

## 2018-12-27 NOTE — ED Triage Notes (Signed)
Pt was dx yesterday with covid. Pt states that she does not feel better today and has body aches. Pt informed that these symptoms will last several days. Pt states that she is looking for pain meds and is SHOB.

## 2018-12-27 NOTE — Discharge Instructions (Signed)
Continue taking home medications as prescribed.  Use tylenol and/or ibuprofen as needed for body aches and fever.  Make sure you are staying well hydrated with water.  You will likely continue to feel poorly for the next few days.  Call your doctor today to set up a telephone appointment for Friday for recheck of your symptoms.  Return to the ER if you develop increased shortness of breath, you feel like you need to be admitted to the hospital, or with any new, worsening, or concerning symptoms.

## 2018-12-27 NOTE — ED Notes (Signed)
Bed: WA12 Expected date:  Expected time:  Means of arrival:  Comments: 

## 2018-12-28 NOTE — Telephone Encounter (Signed)
I spoke to patient and scheduled her ER f/u appointment for 12/29/18 @ 3:15.  Patient doesn't have video capability, so it will be for a phone visit.

## 2018-12-28 NOTE — Telephone Encounter (Signed)
Aware, thanks!

## 2018-12-29 ENCOUNTER — Encounter: Payer: Self-pay | Admitting: Family Medicine

## 2018-12-29 ENCOUNTER — Other Ambulatory Visit: Payer: Self-pay

## 2018-12-29 ENCOUNTER — Ambulatory Visit (INDEPENDENT_AMBULATORY_CARE_PROVIDER_SITE_OTHER): Payer: Medicare HMO | Admitting: Family Medicine

## 2018-12-29 DIAGNOSIS — F17200 Nicotine dependence, unspecified, uncomplicated: Secondary | ICD-10-CM | POA: Diagnosis not present

## 2018-12-29 NOTE — Progress Notes (Signed)
Virtual Visit via Video Note  I connected with [name] [name] on 12/29/18 at  3:15 PM EDT by a video enabled telemedicine application and verified that I am speaking with the correct person using two identifiers. The patient is at home today  I am in my office  She does not have the ability to do video/virtual visit so this was conducted by phone    I discussed the limitations of evaluation and management by telemedicine and the availability of in person appointments. The patient expressed understanding and agreed to proceed.  History of Present Illness: Pt has been to the ED for resp symptoms and gen body aches Her covid-19 test was positive  Last visit was 4/22   last cxr Dg Chest Portable 1 View  Result Date: 12/26/2018 CLINICAL DATA:  Cough and fever. EXAM: PORTABLE CHEST 1 VIEW COMPARISON:  04/05/2018 FINDINGS: Heart size and vascularity normal.  Atherosclerotic aortic arch. COPD with pulmonary hyperinflation and apically scarring bilaterally. No acute infiltrate or effusion. IMPRESSION: COPD with scarring.  No acute abnormality. Electronically Signed   By: Franchot Gallo M.D.   On: 12/26/2018 21:05   Last labs Results for orders placed or performed during the hospital encounter of 12/26/18  SARS Coronavirus 2 [facility] order, Performed in [facility] hospital lab)  Result Value Ref Range   SARS Coronavirus 2 POSITIVE (A) NEGATIVE  CBC with Differential/Platelet  Result Value Ref Range   WBC 3.9 (L) 4.0 - 10.5 K/uL   RBC 4.70 3.87 - 5.11 MIL/uL   Hemoglobin 14.4 12.0 - 15.0 g/dL   HCT 43.7 36.0 - 46.0 %   MCV 93.0 80.0 - 100.0 fL   MCH 30.6 26.0 - 34.0 pg   MCHC 33.0 30.0 - 36.0 g/dL   RDW 14.0 11.5 - 15.5 %   Platelets 201 150 - 400 K/uL   nRBC 0.0 0.0 - 0.2 %   Neutrophils Relative % 52 %   Neutro Abs 2.0 1.7 - 7.7 K/uL   Lymphocytes Relative 37 %   Lymphs Abs 1.4 0.7 - 4.0 K/uL   Monocytes Relative 10 %   Monocytes Absolute 0.4 0.1 - 1.0 K/uL   Eosinophils Relative 1  %   Eosinophils Absolute 0.0 0.0 - 0.5 K/uL   Basophils Relative 0 %   Basophils Absolute 0.0 0.0 - 0.1 K/uL   Immature Granulocytes 0 %   Abs Immature Granulocytes 0.01 0.00 - 0.07 K/uL  Lactic acid, plasma  Result Value Ref Range   Lactic Acid, Venous 1.2 0.5 - 1.9 mmol/L  Comprehensive metabolic panel  Result Value Ref Range   Sodium 139 135 - 145 mmol/L   Potassium 4.3 3.5 - 5.1 mmol/L   Chloride 104 98 - 111 mmol/L   CO2 26 22 - 32 mmol/L   Glucose, Bld 97 70 - 99 mg/dL   BUN 18 8 - 23 mg/dL   Creatinine, Ser 0.74 0.44 - 1.00 mg/dL   Calcium 8.9 8.9 - 10.3 mg/dL   Total Protein 7.4 6.5 - 8.1 g/dL   Albumin 3.9 3.5 - 5.0 g/dL   AST 25 15 - 41 U/L   ALT 18 0 - 44 U/L   Alkaline Phosphatase 59 38 - 126 U/L   Total Bilirubin 0.5 0.3 - 1.2 mg/dL   GFR calc non Af Amer >60 >60 mL/min   GFR calc Af Amer >60 >60 mL/min   Anion gap 9 5 - 15  Troponin I - ONCE - STAT  Result  Value Ref Range   Troponin I <0.03 <0.03 ng/mL    Virals were stable bp of 118/64 Pulse 75 Temp 97.4 resp rate 14 02 of 100% on RA  No thermometer to check temp  Looking for one  Still has chills /aches   She still feels very weak Some loose stool  Some headache - not taking otc / has tylenol on hand and it helps Is taking fluids - some nausea   Has gagged but no vomiting  No blood in her stool   A little sob-no worse than she was  Some wheezing  Has albuterol inhaler if needed   Cough - is not severe / comes and goes in spells  Dry cough -no phlegm  Has tessalon from the hospital -they help a little    She is resting at home  Sleeps on and off  -the best she can  By herself right now - son is close by    No rash or skin changes   Last cigarette was a week ago  None in the house   Review of Systems  Constitutional: Positive for chills and malaise/fatigue. Negative for diaphoresis.  HENT: Positive for congestion. Negative for ear discharge, ear pain, sinus pain and sore throat.    Eyes: Negative for discharge and redness.  Respiratory: Positive for cough, shortness of breath and wheezing. Negative for hemoptysis and sputum production.   Cardiovascular: Negative for chest pain, palpitations and leg swelling.  Gastrointestinal: Positive for diarrhea and nausea. Negative for abdominal pain, blood in stool, constipation, heartburn, melena and vomiting.  Genitourinary: Negative for dysuria.  Musculoskeletal: Positive for myalgias.  Skin: Negative for itching and rash.  Neurological: Positive for headaches. Negative for dizziness and tremors.  Psychiatric/Behavioral: Negative for depression. The patient is not nervous/anxious.      Patient Active Problem List   Diagnosis Date Noted  . COVID-19 virus infection 12/25/2018  . Medicare annual wellness visit, subsequent 10/16/2018  . Skin lesion of face 10/16/2018  . Benign neoplasm of ascending colon   . Benign neoplasm of transverse colon   . Melanosis, colon   . Other specified diseases of esophagus   . Stomach irritation   . Constipation 01/24/2018  . Compression fracture of L1 vertebra (HCC) 01/24/2018  . Smoker 01/09/2018  . Anxiety 01/09/2018  . Coronary artery calcification seen on CAT scan 11/25/2017  . Chest pain 11/22/2017  . Generalized abdominal pain 11/01/2017  . Special screening for malignant neoplasms, colon 11/01/2017  . Estrogen deficiency 09/26/2017  . Aortic calcification ([place]) 11/30/2016  . History of patellar fracture 11/30/2016  . Routine general medical examination at a health care facility 06/27/2016  . Abdominal bloating 06/15/2016  . Dyspepsia 06/04/2016  . Loss of weight 06/04/2016  . History of vertebral compression fracture 01/19/2016  . Esophageal dysphagia 06/28/2014  . Chronic foot pain 10/27/2011  . Low back pain 04/14/2011  . FEVER BLISTER 11/17/2010  . Essential hypertension 08/18/2009  . BUNION 03/04/2009  . Allergic rhinitis 01/10/2008  . H/O goiter 02/09/2007  .  Hypothyroidism 02/09/2007  . HYPERCHOLESTEROLEMIA 02/09/2007  . History of depression 02/09/2007  . OSTEOARTHRITIS 02/09/2007  . Osteoporosis 02/09/2007  . Disturbance in sleep behavior 02/09/2007   Past Medical History:  Diagnosis Date  . COPD (chronic obstructive pulmonary disease) ([place])   . Depression   . Hyperlipidemia   . Hypertension   . Hypothyroidism   . Osteoarthritis   . Osteoporosis   . Shingles    arm  1/12  . Sleep disorder   . Tobacco abuse    past; quit 09   Past Surgical History:  Procedure Laterality Date  . ABDOMINAL HYSTERECTOMY    . BIOPSY THYROID     pos neoplasm, surgery 09/2006  . bunions  06/1998  . COLONOSCOPY WITH PROPOFOL N/A 03/15/2018   Procedure: COLONOSCOPY WITH PROPOFOL;  Surgeon: Virgel Manifold, MD;  Location: [facility] ENDOSCOPY;  Service: Endoscopy;  Laterality: N/A;  . CORONARY ANGIOPLASTY    . CXR-copd, ts partial compression fracture  12/2006  . dexa  12/1996  . ESOPHAGOGASTRODUODENOSCOPY (EGD) WITH PROPOFOL N/A 03/15/2018   Procedure: ESOPHAGOGASTRODUODENOSCOPY (EGD) WITH PROPOFOL;  Surgeon: Virgel Manifold, MD;  Location: [facility] ENDOSCOPY;  Service: Endoscopy;  Laterality: N/A;  . hashimoto  02/1997  . hyerectomy- cervical ca cells    . LEFT HEART CATH AND CORONARY ANGIOGRAPHY Left 12/23/2017   Procedure: LEFT HEART CATH AND CORONARY ANGIOGRAPHY;  Surgeon: Minna Merritts, MD;  Location: [place] [facility];  Service: Cardiovascular;  Laterality: Left;  . left wrist fracture     surgery  . right thyroid lobectomy, goiter, thyroiditis  12/2006  . stress fractures foot    . THYROIDECTOMY  11/2009   Social History   Tobacco Use  . Smoking status: Current Every Day Smoker    Packs/day: 0.50    Types: Cigarettes  . Smokeless tobacco: Never Used  Substance Use Topics  . Alcohol use: No    Alcohol/week: 0.0 standard drinks  . Drug use: No   Family History  Problem Relation Age of Onset  . Stroke Father   . Hypertension Father    . Diabetes Mother   . Coronary artery disease Mother    Allergies  Allergen Reactions  . Fosamax [Alendronate Sodium]     Dysphagia and heartburn   . Raloxifene Hcl Other (See Comments)    Leg pain and cramps  Leg pain and cramps    Current Outpatient Medications on File Prior to Visit  Medication Sig Dispense Refill  . albuterol (PROVENTIL HFA;VENTOLIN HFA) 108 (90 BASE) MCG/ACT inhaler Inhale 2 puffs into the lungs every 4 (four) hours as needed for wheezing. 1 Inhaler 11  . atorvastatin (LIPITOR) 80 MG tablet Take 1 tablet (80 mg total) by mouth daily. 90 tablet 3  . benzonatate (TESSALON) 100 MG capsule Take 1 capsule (100 mg total) by mouth every 8 (eight) hours. 21 capsule 0  . Calcium Carbonate-Vit D-Min (CALCIUM 1200 PO) Take 1 tablet by mouth daily.     Marland Kitchen ezetimibe (ZETIA) 10 MG tablet Take 1 tablet (10 mg total) by mouth daily. 90 tablet 3  . lactulose (CHRONULAC) 10 GM/15ML solution Take 15-30 mL by mouth once daily for 3 days or until you have a bowel movement 100 mL 0  . levothyroxine (SYNTHROID, LEVOTHROID) 75 MCG tablet Take 1 tablet (75 mcg total) by mouth daily. 30 tablet 5  . lisinopril (PRINIVIL,ZESTRIL) 10 MG tablet Take 1 tablet (10 mg total) by mouth daily. 90 tablet 3  . loratadine (CLARITIN) 10 MG tablet Take 1 tablet (10 mg total) by mouth daily.    Marland Kitchen LORazepam (ATIVAN) 1 MG tablet Take 1 tablet (1 mg total) by mouth at bedtime as needed for sleep. 90 tablet 0  . magnesium citrate SOLN Take 296 mLs (1 Bottle total) by mouth once as needed for up to 1 dose (Constipation). 296 mL 0  . nitroGLYCERIN (NITROSTAT) 0.4 MG SL tablet Place 1 tablet (0.4 mg  total) under the tongue every 5 (five) minutes as needed for chest pain. 25 tablet 3  . nortriptyline (PAMELOR) 25 MG capsule Take 1 capsule (25 mg total) by mouth at bedtime. Along with the 75 mg capsule 90 capsule 3  . nortriptyline (PAMELOR) 75 MG capsule Take 1 capsule (75 mg total) by mouth at bedtime. 90 capsule 3   . ondansetron (ZOFRAN ODT) 4 MG disintegrating tablet Take 1 tablet (4 mg total) by mouth every 8 (eight) hours as needed. 20 tablet 0  . polyethylene glycol-electrolytes (NULYTELY/GOLYTELY) 420 g solution Take as directed in split doses. 4000 mL 0  . triamcinolone (KENALOG) 0.025 % cream Apply 1 application topically 2 (two) times daily. To affected itchy areas 15 g 1   No current facility-administered medications on file prior to visit.     Observations/Objective: Patient sounds like her usual self  Not anxious but voices some fatigue Not sob with speech Not hoarse One dry cough during interview    Assessment and Plan: Problem List Items Addressed This Visit      Other   Smoker    Pt still has an occasional cigarette = none in over a week now (with covid infection)  Has occ wheezing-not bad enough for albuterol but she has it if she needs it  Feels like she can quit Enc her strongly to do so  Will continue to monitor       COVID-19 virus infection    With respiratory symptoms and fever -recovering at home  Rev ED notes for both visits-reassuring lab/cxr and vitals disc symptomatic care and need to isolate  Pt does not use computer/mychart so we will monitor by phone  Disc parameters for ED visit-sob/fever/severe weakness/symptoms of dehydration  Strongly enc to quit smoking as well (no cig in the house and none in over a week)  Update if not starting to improve in the next several days  or if worsening             Follow Up Instructions: Take tylenol as needed up to every 4 hours for fever/aches/chills and headache  Keep drinking fluids  Delsym or robitussin DM or mucinex DM over the counter are useful for cough  Keep resting  Isolate yourself for 2 weeks and until fever is gone for 3 days and other symptoms are better (call your son if you need anything) Continue NOT SMOKING! -try to quit for good If symptoms worsen (especially fever or shortness of breath) go to  the ER and let us know  We will check in with you next week      I discussed the assessment and treatment plan with the patient. The patient was provided an opportunity to ask questions and all were answered. The patient agreed with the plan and demonstrated an understanding of the instructions.   The patient was advised to call back or seek an in-person evaluation if the symptoms worsen or if the condition fails to improve as anticipated.   I spent 22 minutes of contact time with patient by phone for this visit.  Loura Pardon, MD

## 2018-12-29 NOTE — Assessment & Plan Note (Signed)
With respiratory symptoms and fever -recovering at home  Rev ED notes for both visits-reassuring lab/cxr and vitals disc symptomatic care and need to isolate  Pt does not use computer/mychart so we will monitor by phone  Disc parameters for ED visit-sob/fever/severe weakness/symptoms of dehydration  Strongly enc to quit smoking as well (no cig in the house and none in over a week)  Update if not starting to improve in the next several days  or if worsening

## 2018-12-29 NOTE — Assessment & Plan Note (Signed)
Pt still has an occasional cigarette = none in over a week now (with covid infection)  Has occ wheezing-not bad enough for albuterol but she has it if she needs it  Feels like she can quit Enc her strongly to do so  Will continue to monitor

## 2019-01-01 ENCOUNTER — Other Ambulatory Visit: Payer: Self-pay | Admitting: *Deleted

## 2019-01-01 MED ORDER — LORAZEPAM 1 MG PO TABS: 1.0000 mg | ORAL_TABLET | Freq: Every evening | ORAL | 0 refills | Status: DC | PRN

## 2019-01-01 NOTE — Telephone Encounter (Signed)
When we had our visit she stated she had diarrhea already for close to a week  I want to watch her closely  If worse /or more sob please go to the ED Continue drinking fluids  If diarrhea becomes severe also let us know or go to ER Thanks  Please check on her again  I sent #15 of the lorazepam

## 2019-01-01 NOTE — Telephone Encounter (Signed)
Per Covid-19 protocol, I called to check on pt:   Pt said she started having diarrhea this morning which is a new sxs. She still is dealing with the diarrhea now. Pt said overall she not feeling good and really fatigued, pt is still dealing with a cough/congestion but feels like she can't cough anything up like it's stuck in her. She was also a little SOB this morning but that has improved since this morning   Also we did send Rx of lorazepam 1mg  to mail order pharmacy but since pt has been dealing with Covid-19 she didn't get a chance to request them to mail Rx out, pt is requesting a small supply of ativan to [facility] [address] because she is out an that helps her sleep at night with all her Covid-19 sxs

## 2019-01-03 ENCOUNTER — Telehealth: Payer: Self-pay | Admitting: Family Medicine

## 2019-01-03 DIAGNOSIS — E890 Postprocedural hypothyroidism: Secondary | ICD-10-CM

## 2019-01-03 NOTE — Telephone Encounter (Signed)
-----   Message from Cloyd Stagers, RT sent at 01/03/2019 11:09 AM EDT ----- Regarding: Lab Order CLarification for Tuesday 5.5.2020 The Appt note says "recheck TSH", there are multiples other lab orders but no TSH...  Would you like the other orders plus TSH or only TSH?   Thank you, Dyke Maes RT(R)

## 2019-01-09 ENCOUNTER — Other Ambulatory Visit: Payer: Medicare HMO

## 2019-01-11 ENCOUNTER — Telehealth: Payer: Self-pay | Admitting: Family Medicine

## 2019-01-11 NOTE — Telephone Encounter (Signed)
She cannot come through for the shot until her symptoms are better and no fever (w/o medicine) for at least 3 days.  It may take her a while to get better but I do not want to start the medicine until she is totally better.   Thanks

## 2019-01-11 NOTE — Telephone Encounter (Signed)
Discussed Prolia benefits w/pt.  Pt understands and agrees her cost will be approximately $275 (Prolia 20% - $230/Admin Fee $45).  Pt has Covid 19.  Today is day 15 since she has had the virus.  She is still not quite all the way feeling 100%.  When should pt have Prolia

## 2019-01-12 NOTE — Telephone Encounter (Signed)
Spoke w/pt.  She will c/b after she has had no fever w/o medicine and she has no Covid sxs and is feeling totally better.

## 2019-01-22 ENCOUNTER — Telehealth: Payer: Self-pay

## 2019-01-22 NOTE — Telephone Encounter (Signed)
Pt left v/m requesting cb to change 01/23/19 appt. FYI to [place].

## 2019-01-22 NOTE — Telephone Encounter (Signed)
I spoke to patient. Patient had Covid and she said she's not feeling well enough to come to the appointment. Patient rescheduled appointment to 02/06/19.

## 2019-01-23 ENCOUNTER — Other Ambulatory Visit: Payer: Medicare HMO

## 2019-02-01 ENCOUNTER — Telehealth: Payer: Self-pay | Admitting: *Deleted

## 2019-02-01 NOTE — Telephone Encounter (Signed)
I called pt.   I asked if she would be willing to donate plasma since she was recovered from the COVID-19.   I explained about the development of antibodies in people who have had the virus and how we use the plasma to assist in the recovery of other people with COVID-19.  He responded,  "I'm not interested in doing that for now".   I thanked her for her time.

## 2019-02-06 ENCOUNTER — Other Ambulatory Visit (INDEPENDENT_AMBULATORY_CARE_PROVIDER_SITE_OTHER): Payer: Medicare HMO

## 2019-02-06 DIAGNOSIS — E890 Postprocedural hypothyroidism: Secondary | ICD-10-CM

## 2019-02-06 DIAGNOSIS — I10 Essential (primary) hypertension: Secondary | ICD-10-CM

## 2019-02-06 DIAGNOSIS — M8000XS Age-related osteoporosis with current pathological fracture, unspecified site, sequela: Secondary | ICD-10-CM

## 2019-02-06 DIAGNOSIS — E7800 Pure hypercholesterolemia, unspecified: Secondary | ICD-10-CM

## 2019-02-06 LAB — COMPREHENSIVE METABOLIC PANEL
ALT: 12 U/L (ref 0–35)
AST: 19 U/L (ref 0–37)
Albumin: 3.7 g/dL (ref 3.5–5.2)
Alkaline Phosphatase: 48 U/L (ref 39–117)
BUN: 11 mg/dL (ref 6–23)
CO2: 28 mEq/L (ref 19–32)
Calcium: 8.8 mg/dL (ref 8.4–10.5)
Chloride: 104 mEq/L (ref 96–112)
Creatinine, Ser: 0.7 mg/dL (ref 0.40–1.20)
GFR: 81.93 mL/min (ref >60.00)
Glucose, Bld: 108 mg/dL — ABNORMAL HIGH (ref 70–99)
Potassium: 3.8 mEq/L (ref 3.5–5.1)
Sodium: 139 mEq/L (ref 135–145)
Total Bilirubin: 0.4 mg/dL (ref 0.2–1.2)
Total Protein: 6.3 g/dL (ref 6.0–8.3)

## 2019-02-06 LAB — CBC WITH DIFFERENTIAL/PLATELET
Basophils Absolute: 0 10*3/uL (ref 0.0–0.1)
Basophils Relative: 0.8 % (ref 0.0–3.0)
Eosinophils Absolute: 0.1 10*3/uL (ref 0.0–0.7)
Eosinophils Relative: 2.3 % (ref 0.0–5.0)
HCT: 39.3 % (ref 36.0–46.0)
Hemoglobin: 13.5 g/dL (ref 12.0–15.0)
Lymphocytes Relative: 40.5 % (ref 12.0–46.0)
Lymphs Abs: 2 10*3/uL (ref 0.7–4.0)
MCHC: 34.3 g/dL (ref 30.0–36.0)
MCV: 93.7 fl (ref 78.0–100.0)
Monocytes Absolute: 0.4 10*3/uL (ref 0.1–1.0)
Monocytes Relative: 8.2 % (ref 3.0–12.0)
Neutro Abs: 2.3 10*3/uL (ref 1.4–7.7)
Neutrophils Relative %: 48.2 % (ref 43.0–77.0)
Platelets: 246 10*3/uL (ref 150.0–400.0)
RBC: 4.2 Mil/uL (ref 3.87–5.11)
RDW: 15.3 % (ref 11.5–15.5)
WBC: 4.8 10*3/uL (ref 4.0–10.5)

## 2019-02-06 LAB — VITAMIN D 25 HYDROXY (VIT D DEFICIENCY, FRACTURES): VITD: 33.36 ng/mL (ref 30.00–100.00)

## 2019-02-06 LAB — LIPID PANEL
Cholesterol: 198 mg/dL (ref 0–200)
HDL: 71.6 mg/dL (ref >39.00)
LDL Cholesterol: 112 mg/dL — ABNORMAL HIGH (ref 0–99)
NonHDL: 126.26
Total CHOL/HDL Ratio: 3
Triglycerides: 73 mg/dL (ref 0.0–149.0)
VLDL: 14.6 mg/dL (ref 0.0–40.0)

## 2019-02-06 LAB — TSH: TSH: 3.03 u[IU]/mL (ref 0.35–4.50)

## 2019-02-14 ENCOUNTER — Telehealth: Payer: Self-pay

## 2019-02-14 NOTE — Telephone Encounter (Signed)
Virtual Visit Pre-Appointment Phone Call  "[name] [name], I am calling you today to discuss your upcoming appointment. We are currently trying to limit exposure to the virus that causes COVID-19 by seeing patients at home rather than in the office."  1. "What is the BEST phone number to call the day of the visit?" - include this in appointment notes  2. Do you have or have access to (through a family member/friend) a smartphone with video capability that we can use for your visit?" a. If yes - list this number in appt notes as cell (if different from BEST phone #) and list the appointment type as a VIDEO visit in appointment notes b. If no - list the appointment type as a PHONE visit in appointment notes  3. Confirm consent - "In the setting of the current Covid19 crisis, you are scheduled for a phone visit with your provider on 03/05/2019 at 10:20AM.  Just as we do with many in-office visits, in order for you to participate in this visit, we must obtain consent.  If you'd like, I can send this to your mychart (if signed up) or email for you to review.  Otherwise, I can obtain your verbal consent now.  All virtual visits are billed to your insurance company just like a normal visit would be.  By agreeing to a virtual visit, we'd like you to understand that the technology does not allow for your provider to perform an examination, and thus may limit your provider's ability to fully assess your condition. If your provider identifies any concerns that need to be evaluated in person, we will make arrangements to do so.  Finally, though the technology is pretty good, we cannot assure that it will always work on either your or our end, and in the setting of a video visit, we may have to convert it to a phone-only visit.  In either situation, we cannot ensure that we have a secure connection.  Are you willing to proceed?" STAFF: Did the patient verbally acknowledge consent to telehealth visit? Document YES/NO  here: YES  4. Advise patient to be prepared - "Two hours prior to your appointment, go ahead and check your blood pressure, pulse, oxygen saturation, and your weight (if you have the equipment to check those) and write them all down. When your visit starts, your provider will ask you for this information. If you have an Apple Watch or Kardia device, please plan to have heart rate information ready on the day of your appointment. Please have a pen and paper handy nearby the day of the visit as well."  5. Give patient instructions for MyChart download to smartphone OR [email or web address] as below if video visit (depending on what platform provider is using)  6. Inform patient they will receive a phone call 15 minutes prior to their appointment time (may be from unknown caller ID) so they should be prepared to answer    TELEPHONE CALL NOTE  [name] [name] has been deemed a candidate for a follow-up tele-health visit to limit community exposure during the Covid-19 pandemic. I spoke with the patient via phone to ensure availability of phone/video source, confirm preferred email & phone number, and discuss instructions and expectations.  I reminded [name] [name] to be prepared with any vital sign and/or heart rhythm information that could potentially be obtained via home monitoring, at the time of her visit. I reminded [name] [name] to expect a phone call prior to her visit.  Horton Finer 02/14/2019 8:38 AM    FULL LENGTH CONSENT FOR TELE-HEALTH VISIT   I hereby voluntarily request, consent and authorize [facility] and its employed or contracted physicians, physician assistants, nurse practitioners or other licensed health care professionals (the Practitioner), to provide me with telemedicine health care services (the Services") as deemed necessary by the treating Practitioner. I acknowledge and consent to receive the Services by the Practitioner via telemedicine. I understand that  the telemedicine visit will involve communicating with the Practitioner through live audiovisual communication technology and the disclosure of certain medical information by electronic transmission. I acknowledge that I have been given the opportunity to request an in-person assessment or other available alternative prior to the telemedicine visit and am voluntarily participating in the telemedicine visit.  I understand that I have the right to withhold or withdraw my consent to the use of telemedicine in the course of my care at any time, without affecting my right to future care or treatment, and that the Practitioner or I may terminate the telemedicine visit at any time. I understand that I have the right to inspect all information obtained and/or recorded in the course of the telemedicine visit and may receive copies of available information for a reasonable fee.  I understand that some of the potential risks of receiving the Services via telemedicine include:   Delay or interruption in medical evaluation due to technological equipment failure or disruption;  Information transmitted may not be sufficient (e.g. poor resolution of images) to allow for appropriate medical decision making by the Practitioner; and/or   In rare instances, security protocols could fail, causing a breach of personal health information.  Furthermore, I acknowledge that it is my responsibility to provide information about my medical history, conditions and care that is complete and accurate to the best of my ability. I acknowledge that Practitioner's advice, recommendations, and/or decision may be based on factors not within their control, such as incomplete or inaccurate data provided by me or distortions of diagnostic images or specimens that may result from electronic transmissions. I understand that the practice of medicine is not an exact science and that Practitioner makes no warranties or guarantees regarding treatment  outcomes. I acknowledge that I will receive a copy of this consent concurrently upon execution via email to the email address I last provided but may also request a printed copy by calling the office of [place].    I understand that my insurance will be billed for this visit.   I have read or had this consent read to me.  I understand the contents of this consent, which adequately explains the benefits and risks of the Services being provided via telemedicine.   I have been provided ample opportunity to ask questions regarding this consent and the Services and have had my questions answered to my satisfaction.  I give my informed consent for the services to be provided through the use of telemedicine in my medical care  By participating in this telemedicine visit I agree to the above.

## 2019-02-20 ENCOUNTER — Telehealth: Payer: Self-pay

## 2019-02-20 NOTE — Telephone Encounter (Signed)
Called patient to change appt to in office.  No answer. LMOV.   

## 2019-02-28 ENCOUNTER — Telehealth: Payer: Self-pay

## 2019-02-28 NOTE — Telephone Encounter (Signed)
If she currently has a negative screen and she is that far out she should be ok for her visit, but will forward to Dr. Bluford Kaufmann for her appointment on 03/05/19.

## 2019-02-28 NOTE — Telephone Encounter (Signed)

## 2019-02-28 NOTE — Telephone Encounter (Signed)
Called patient to ask COVID screening questions.  Patient denied any symptoms but did state that she had the virus 12/27/2018.  Just wanted to make someone aware.

## 2019-03-01 DIAGNOSIS — L570 Actinic keratosis: Secondary | ICD-10-CM | POA: Diagnosis not present

## 2019-03-01 DIAGNOSIS — L578 Other skin changes due to chronic exposure to nonionizing radiation: Secondary | ICD-10-CM | POA: Diagnosis not present

## 2019-03-01 DIAGNOSIS — L820 Inflamed seborrheic keratosis: Secondary | ICD-10-CM | POA: Diagnosis not present

## 2019-03-05 ENCOUNTER — Ambulatory Visit: Payer: Medicare HMO | Admitting: Cardiovascular Disease

## 2019-03-15 ENCOUNTER — Telehealth: Payer: Self-pay | Admitting: Family Medicine

## 2019-03-15 NOTE — Telephone Encounter (Signed)
Pt is feeling back to normal after having Covid.  She would like to know when she can have her Prolia injection.

## 2019-03-15 NOTE — Telephone Encounter (Signed)
Any time is fine So glad she is feeling better!

## 2019-03-20 ENCOUNTER — Telehealth: Payer: Self-pay | Admitting: Family Medicine

## 2019-03-20 NOTE — Telephone Encounter (Signed)
Discussed Prolia benefits w/pt.  Pt owes approximately $275 (20% Prolia, $230 and Admin fee $45).  Pt scheduled for next injection on 03-29-2019.  Pt did state she had loose diarrhea 4 times this morning. She feels this is due to foods she ate yesterday, green beans and yellow squash, that hasn't been eating lately. No other Covid sxs.  Pt advised if diarrhea continues and doesn't resolve by Thursday to contact our office.

## 2019-03-20 NOTE — Telephone Encounter (Signed)
Aware, thanks!

## 2019-03-22 ENCOUNTER — Other Ambulatory Visit: Payer: Self-pay

## 2019-03-22 MED ORDER — LORAZEPAM 1 MG PO TABS: 1.0000 mg | ORAL_TABLET | Freq: Every evening | ORAL | 2 refills | Status: DC | PRN

## 2019-03-22 NOTE — Telephone Encounter (Signed)
Name of Medication: lorazepam 1 mg Name of Pharmacy:[facility]  Last Fill or Written Date and Quantity: # 15 on 01/01/19 Last Office Visit and Type: 12/29/18 HFU Next Office Visit and Type:none scheduled   Pt is out of med and did not sleep last night.

## 2019-03-29 ENCOUNTER — Ambulatory Visit (INDEPENDENT_AMBULATORY_CARE_PROVIDER_SITE_OTHER): Payer: Medicare HMO | Admitting: *Deleted

## 2019-03-29 DIAGNOSIS — M8000XS Age-related osteoporosis with current pathological fracture, unspecified site, sequela: Secondary | ICD-10-CM | POA: Diagnosis not present

## 2019-03-29 MED ORDER — DENOSUMAB 60 MG/ML ~~LOC~~ SOSY
60.0000 mg | PREFILLED_SYRINGE | Freq: Once | SUBCUTANEOUS | Status: AC
  Administered 2019-03-29: 60 mg via SUBCUTANEOUS

## 2019-03-29 NOTE — Progress Notes (Signed)
Per orders of Dr. Tower, injection of Prolia given by Josey Dettmann M. Patient tolerated injection well.  

## 2019-04-06 DIAGNOSIS — M67431 Ganglion, right wrist: Secondary | ICD-10-CM | POA: Diagnosis not present

## 2019-04-11 ENCOUNTER — Other Ambulatory Visit: Payer: Self-pay | Admitting: *Deleted

## 2019-04-11 MED ORDER — NORTRIPTYLINE HCL 75 MG PO CAPS: 75.0000 mg | ORAL_CAPSULE | Freq: Every day | ORAL | 0 refills | Status: DC

## 2019-04-11 NOTE — Telephone Encounter (Signed)
I sent a 15 day supply Thanks

## 2019-04-11 NOTE — Telephone Encounter (Signed)
Patient advised.

## 2019-04-11 NOTE — Telephone Encounter (Signed)
Patient called stating that she was late in ordering her Nortriptyline 75mg   from her mail order pharmacy and will not be getting it until next week. Patient is requesting that a script be sent to her local pharmacy to last her until she gets it in the [place]

## 2019-04-18 ENCOUNTER — Other Ambulatory Visit: Payer: Self-pay | Admitting: Family Medicine

## 2019-04-21 NOTE — Progress Notes (Signed)
Cardiology Office Note  Date:  04/23/2019   ID:  [name] [name], DOB 1946-01-28, MRN [id number]  PCP:  Abner Greenspan, MD   Chief Complaint  Patient presents with  . other    12 mo f/u. Medications reviewed verbally.     HPI:  [name] is a 73 year old woman with history of Former smoker -quit in 09 Known aortic calcification on imaging /CT scan COPD Chronic back pain  Chronic chest pain Cardiac catheterization April 2019 with no significant coronary disease but she does have moderate diffuse calcification Presents for f/u of her chest pain, positive stress test, severe coronary calcification on CT scan,  cardiac catheterization  Overall reports that she is feeling relatively well  Previous studies discussed with her Cardiac catheterization December 23, 2017 No significant coronary disease noted, moderate diffuse calcification noted   Smoking cessation, 3 a day Statin, Walking program,  Not much exercise, walks with sister, not on her own very much  She denies any chest pain, sedentary  Tolerating Lipitor 80 Reports she is also taking Zetia  Works in her garden Previously reported having insomnia , not sleeping, always tired Dragging in the daytime  EKG personally reviewed by myself on todays visit Shows normal sinus rhythm rate PVCs in a bigeminal pattern  Other past medical history reviewed Stress test 11/2017 significant GI uptake artifact noted Non-attenuation corrected images: no significant ischemia, significant GI uptake artifact Attenuation corrected images: Appearance of ischemia in the mid to distal anterior wall, apical region moderate in size and severity,  unable to exclude perfusion abnormality/ artifact from GI uptake Normal wall motion, EF estimated at 67% No EKG changes concerning for ischemia at peak stress or in recovery. PVCs noted Clinical correlation recommended, if indicated consider alternate study  Long history of smoking,  hyperlipidemia Total cholesterol 230, reports she is taking Lipitor 80 daily  CT chest  2015 Images reviewed with her in detail Moderate aortic atherosclerosis particularly in the arch Very mild if any in the carotids Mild to moderate in the distal descending aorta, iliacs  PMH:   has a past medical history of COPD (chronic obstructive pulmonary disease) ([place]), Depression, Hyperlipidemia, Hypertension, Hypothyroidism, Osteoarthritis, Osteoporosis, Shingles, Sleep disorder, and Tobacco abuse.  PSH:    Past Surgical History:  Procedure Laterality Date  . ABDOMINAL HYSTERECTOMY    . BIOPSY THYROID     pos neoplasm, surgery 09/2006  . bunions  06/1998  . COLONOSCOPY WITH PROPOFOL N/A 03/15/2018   Procedure: COLONOSCOPY WITH PROPOFOL;  Surgeon: Virgel Manifold, MD;  Location: [facility]COPY;  Service: Endoscopy;  Laterality: N/A;  . CORONARY ANGIOPLASTY    . CXR-copd, ts partial compression fracture  12/2006  . dexa  12/1996  . ESOPHAGOGASTRODUODENOSCOPY (EGD) WITH PROPOFOL N/A 03/15/2018   Procedure: ESOPHAGOGASTRODUODENOSCOPY (EGD) WITH PROPOFOL;  Surgeon: Virgel Manifold, MD;  Location: [facility] ENDOSCOPY;  Service: Endoscopy;  Laterality: N/A;  . hashimoto  02/1997  . hyerectomy- cervical ca cells    . LEFT HEART CATH AND CORONARY ANGIOGRAPHY Left 12/23/2017   Procedure: LEFT HEART CATH AND CORONARY ANGIOGRAPHY;  Surgeon: Minna Merritts, MD;  Location: [facility];  Service: Cardiovascular;  Laterality: Left;  . left wrist fracture     surgery  . right thyroid lobectomy, goiter, thyroiditis  12/2006  . stress fractures foot    . THYROIDECTOMY  11/2009    Current Outpatient Medications  Medication Sig Dispense Refill  . albuterol (PROVENTIL HFA;VENTOLIN HFA) 108 (90 BASE)  MCG/ACT inhaler Inhale 2 puffs into the lungs every 4 (four) hours as needed for wheezing. 1 Inhaler 11  . atorvastatin (LIPITOR) 80 MG tablet Take 1 tablet (80 mg total) by mouth daily. 90 tablet 3  .  benzonatate (TESSALON) 100 MG capsule Take 1 capsule (100 mg total) by mouth every 8 (eight) hours. 21 capsule 0  . Calcium Carbonate-Vit D-Min (CALCIUM 1200 PO) Take 1 tablet by mouth daily.     Marland Kitchen lactulose (CHRONULAC) 10 GM/15ML solution Take 15-30 mL by mouth once daily for 3 days or until you have a bowel movement 100 mL 0  . levothyroxine (SYNTHROID, LEVOTHROID) 75 MCG tablet Take 1 tablet (75 mcg total) by mouth daily. 30 tablet 5  . lisinopril (PRINIVIL,ZESTRIL) 10 MG tablet Take 1 tablet (10 mg total) by mouth daily. 90 tablet 3  . loratadine (CLARITIN) 10 MG tablet Take 1 tablet (10 mg total) by mouth daily.    Marland Kitchen LORazepam (ATIVAN) 1 MG tablet Take 1 tablet (1 mg total) by mouth at bedtime as needed for sleep. 30 tablet 2  . magnesium citrate SOLN Take 296 mLs (1 Bottle total) by mouth once as needed for up to 1 dose (Constipation). 296 mL 0  . nitroGLYCERIN (NITROSTAT) 0.4 MG SL tablet Place 1 tablet (0.4 mg total) under the tongue every 5 (five) minutes as needed for chest pain. 25 tablet 3  . nortriptyline (PAMELOR) 25 MG capsule Take 1 capsule (25 mg total) by mouth at bedtime. Along with the 75 mg capsule 90 capsule 3  . nortriptyline (PAMELOR) 75 MG capsule Take 1 capsule (75 mg total) by mouth at bedtime. 15 capsule 0  . ondansetron (ZOFRAN ODT) 4 MG disintegrating tablet Take 1 tablet (4 mg total) by mouth every 8 (eight) hours as needed. 20 tablet 0  . polyethylene glycol-electrolytes (NULYTELY/GOLYTELY) 420 g solution Take as directed in split doses. 4000 mL 0  . triamcinolone (KENALOG) 0.025 % cream Apply 1 application topically 2 (two) times daily. To affected itchy areas 15 g 1  . ezetimibe (ZETIA) 10 MG tablet Take 1 tablet (10 mg total) by mouth daily. 90 tablet 3   No current facility-administered medications for this visit.      Allergies:   Fosamax [alendronate sodium] and Raloxifene hcl   Social History:  The patient  reports that she has been smoking cigarettes. She  has been smoking about 0.50 packs per day. She has never used smokeless tobacco. She reports that she does not drink alcohol or use drugs.   Family History:   family history includes Coronary artery disease in her mother; Diabetes in her mother; Hypertension in her father; Stroke in her father.   Review of Systems: Review of Systems  Constitutional: Negative.   Respiratory: Negative.   Cardiovascular: Positive for chest pain.  Gastrointestinal: Positive for heartburn.  Musculoskeletal: Negative.   Neurological: Negative.   Psychiatric/Behavioral: Negative.   All other systems reviewed and are negative.   PHYSICAL EXAM: VS:  BP 132/70 (BP Location: Left Arm, Patient Position: Sitting, Cuff Size: Normal)   Pulse 72   Ht 5' 2.5" (1.588 m)   Wt 110 lb (49.9 kg)   SpO2 94%   BMI 19.80 kg/m  , BMI Body mass index is 19.8 kg/m. Constitutional:  oriented to person, place, and time. No distress. thin HENT:  Head: Normocephalic and atraumatic.  Eyes:  no discharge. No scleral icterus.  Neck: Normal range of motion. Neck supple. No JVD present.  Cardiovascular: Normal rate, regular rhythm, normal heart sounds and intact distal pulses. Exam reveals no gallop and no friction rub. No edema No murmur heard. Pulmonary/Chest: Effort normal and breath sounds normal. No stridor. No respiratory distress.  no wheezes.  no rales.  no tenderness.  Abdominal: Soft.  no distension.  no tenderness.  Musculoskeletal: Normal range of motion.  no  tenderness or deformity.  Neurological:  normal muscle tone. Coordination normal. No atrophy Skin: Skin is warm and dry. No rash noted. not diaphoretic.  Psychiatric:  normal mood and affect. behavior is normal. Thought content normal.    Recent Labs: 02/06/2019: ALT 12; BUN 11; Creatinine, Ser 0.70; Hemoglobin 13.5; Platelets 246.0; Potassium 3.8; Sodium 139; TSH 3.03    Lipid Panel Lab Results  Component Value Date   CHOL 198 02/06/2019   HDL 71.60  02/06/2019   LDLCALC 112 (H) 02/06/2019   TRIG 73.0 02/06/2019      Wt Readings from Last 3 Encounters:  04/23/19 110 lb (49.9 kg)  12/27/18 111 lb 15.9 oz (50.8 kg)  12/26/18 111 lb 15.9 oz (50.8 kg)     ASSESSMENT AND PLAN:  Aortic calcification (HCC) On Lipitor 80 Zetia 10 mg daily Stressed importance of medication compliance  Hyperlipidemia  Lipitor 80 daily,   Zetia 10 mg daily As above  Chest pain, unspecified type -   cardiac catheterization showing heavy  calcification with no significant stenosis, 2019 We will continue aggressive lipid management Stop smoking   Coronary artery calcification seen on CAT scan Calcification noted in the LAD Catheterization results as above  TOBACCO USE, QUIT Reports that she quit in 2009 Denies shortness of breath on exertion  PVCs No sx. PVCs in bigeminal pattern on EKG Not on b-blocker, Gone on exam.  Discussed with her, as she is asymptomatic we will not add any medications at this time   Dyspepsia Continue omeprazole prn, not on a regular basis  Essential hypertension Blood pressure is well controlled on today's visit. No changes made to the medications.   Disposition:   F/U  12 months   Total encounter time more than 25 minutes  Greater than 50% was spent in counseling and coordination of care with the patient    Orders Placed This Encounter  Procedures  . EKG 12-Lead     Signed, Esmond Plants, M.D., Ph.D. 04/23/2019  [place], [place]

## 2019-04-22 ENCOUNTER — Other Ambulatory Visit: Payer: Self-pay | Admitting: Family Medicine

## 2019-04-23 ENCOUNTER — Ambulatory Visit (INDEPENDENT_AMBULATORY_CARE_PROVIDER_SITE_OTHER): Payer: Medicare HMO | Admitting: Cardiovascular Disease

## 2019-04-23 ENCOUNTER — Encounter: Payer: Self-pay | Admitting: Cardiovascular Disease

## 2019-04-23 ENCOUNTER — Other Ambulatory Visit: Payer: Self-pay

## 2019-04-23 VITALS — BP 132/70 | HR 72 | Ht 62.5 in | Wt 110.0 lb

## 2019-04-23 DIAGNOSIS — R079 Chest pain, unspecified: Secondary | ICD-10-CM | POA: Diagnosis not present

## 2019-04-23 DIAGNOSIS — I700 Atherosclerosis of aorta: Secondary | ICD-10-CM

## 2019-04-23 DIAGNOSIS — F17200 Nicotine dependence, unspecified, uncomplicated: Secondary | ICD-10-CM

## 2019-04-23 DIAGNOSIS — E7800 Pure hypercholesterolemia, unspecified: Secondary | ICD-10-CM | POA: Diagnosis not present

## 2019-04-23 DIAGNOSIS — I10 Essential (primary) hypertension: Secondary | ICD-10-CM | POA: Diagnosis not present

## 2019-04-23 DIAGNOSIS — I2510 Atherosclerotic heart disease of native coronary artery without angina pectoris: Secondary | ICD-10-CM

## 2019-04-23 NOTE — Patient Instructions (Signed)

## 2019-05-11 ENCOUNTER — Other Ambulatory Visit: Payer: Self-pay

## 2019-05-11 ENCOUNTER — Encounter
Admission: RE | Admit: 2019-05-11 | Discharge: 2019-05-11 | Disposition: A | Discharge status: Home / Self Care | Payer: Medicare HMO | Source: Ambulatory Visit | Attending: Surgery | Admitting: Surgery

## 2019-05-11 DIAGNOSIS — Z20828 Contact with and (suspected) exposure to other viral communicable diseases: Secondary | ICD-10-CM | POA: Insufficient documentation

## 2019-05-11 DIAGNOSIS — Z01812 Encounter for preprocedural laboratory examination: Secondary | ICD-10-CM | POA: Diagnosis not present

## 2019-05-11 LAB — BASIC METABOLIC PANEL
Anion gap: 8 (ref 5–15)
BUN: 13 mg/dL (ref 8–23)
CO2: 26 mmol/L (ref 22–32)
Calcium: 8.9 mg/dL (ref 8.9–10.3)
Chloride: 105 mmol/L (ref 98–111)
Creatinine, Ser: 0.63 mg/dL (ref 0.44–1.00)
GFR calc Af Amer: >60 mL/min (ref >60)
GFR calc non Af Amer: >60 mL/min (ref >60)
Glucose, Bld: 100 mg/dL — ABNORMAL HIGH (ref 70–99)
Potassium: 4.6 mmol/L (ref 3.5–5.1)
Sodium: 139 mmol/L (ref 135–145)

## 2019-05-11 LAB — CBC
HCT: 42.9 % (ref 36.0–46.0)
Hemoglobin: 14 g/dL (ref 12.0–15.0)
MCH: 31 pg (ref 26.0–34.0)
MCHC: 32.6 g/dL (ref 30.0–36.0)
MCV: 94.9 fL (ref 80.0–100.0)
Platelets: 235 10*3/uL (ref 150–400)
RBC: 4.52 MIL/uL (ref 3.87–5.11)
RDW: 13.7 % (ref 11.5–15.5)
WBC: 5 10*3/uL (ref 4.0–10.5)
nRBC: 0 % (ref 0.0–0.2)

## 2019-05-11 LAB — SARS CORONAVIRUS 2 (TAT 6-24 HRS): SARS Coronavirus 2: NEGATIVE

## 2019-05-11 NOTE — Patient Instructions (Signed)
Your procedure is scheduled on: Wednesday 05/16/19  Report to [facility]. To find out your arrival time please call [phone] between 1PM - 3PM on Tuesday 05/15/19.   Remember: Instructions that are not followed completely may result in serious medical risk, up to and including death, or upon the discretion of your surgeon and anesthesiologist your surgery may need to be rescheduled.      _X__ 1. Do not eat food after midnight the night before your procedure.                 No gum chewing or hard candies. You may drink clear liquids up to 2 hours                 before you are scheduled to arrive for your surgery- DO NOT drink clear                 liquids within 2 hours of the start of your surgery.                 Clear Liquids include:  water, apple juice without pulp, clear carbohydrate                 drink such as Clearfast or Gatorade, Black Coffee or Tea (Do not add                 milk or creamer to coffee or tea).   __X__2.  On the morning of surgery brush your teeth with toothpaste and water, you may rinse your mouth with mouthwash if you wish.  Do not swallow any toothpaste or mouthwash.    .   _X__ 3.  Do Not Smoke or use e-cigarettes For 24 Hours Prior to Your Surgery.                 Do not use any chewable tobacco products for at least 6 hours prior to                 surgery.   __X__4.  Notify your doctor if there is any change in your medical condition      (cold, fever, infections).      Do not wear jewelry, make-up, hairpins, clips or nail polish. Do not wear lotions, powders, or perfumes.  Do not shave 48 hours prior to surgery. Men may shave face and neck. Do not bring valuables to the hospital.     [facility] is not responsible for any belongings or valuables.  Contacts, dentures/partials or body piercings may not be worn into surgery. Bring a case for your contacts, glasses or hearing aids, a  denture cup will be supplied.    Patients discharged the day of surgery will not be allowed to drive home.    Please read over the following fact sheets that you were given:   MRSA Information   __X__ Take these medicines the morning of surgery with A SIP OF WATER:     1. albuterol (PROVENTIL HFA;VENTOLIN HFA) 108 (90 BASE) MCG/ACT inhaler  2. ezetimibe (ZETIA) 10 MG tablet  3. loratadine (CLARITIN) 10 MG tablet  4. ondansetron (ZOFRAN ODT) 4 MG disintegrating tablet if needed       __X__ Use CHG Soap as directed   _ X___ Use inhalers on the day of surgery. Also bring the inhaler with you to the hospital on the morning of surgery.   __X__ Stop Anti-inflammatories  7 days before surgery such as Advil, Ibuprofen, Motrin, BC or Goodies Powder, Naprosyn, Naproxen, Aleve, Aspirin, Meloxicam. May take Tylenol if needed for pain or discomfort.    __X__ Please don't begin taking any new herbal supplements before your procedure.

## 2019-05-15 ENCOUNTER — Encounter: Payer: Self-pay | Admitting: Anesthesiology

## 2019-05-15 MED ORDER — CEFAZOLIN SODIUM-DEXTROSE 2-4 GM/100ML-% IV SOLN
2.0000 g | Freq: Once | INTRAVENOUS | Status: AC
  Administered 2019-05-16: 2 g via INTRAVENOUS

## 2019-05-16 ENCOUNTER — Other Ambulatory Visit: Payer: Self-pay

## 2019-05-16 ENCOUNTER — Ambulatory Visit: Payer: Medicare HMO | Admitting: Anesthesiology

## 2019-05-16 ENCOUNTER — Ambulatory Visit
Admission: RE | Admit: 2019-05-16 | Discharge: 2019-05-16 | Disposition: A | Discharge status: Home / Self Care | Payer: Medicare HMO | Attending: Surgery | Admitting: Surgery

## 2019-05-16 ENCOUNTER — Encounter
Admission: RE | Disposition: A | Discharge status: Home / Self Care | Payer: Self-pay | Source: Home / Self Care | Attending: Surgery

## 2019-05-16 DIAGNOSIS — M1990 Unspecified osteoarthritis, unspecified site: Secondary | ICD-10-CM | POA: Diagnosis not present

## 2019-05-16 DIAGNOSIS — I2510 Atherosclerotic heart disease of native coronary artery without angina pectoris: Secondary | ICD-10-CM | POA: Diagnosis not present

## 2019-05-16 DIAGNOSIS — Z823 Family history of stroke: Secondary | ICD-10-CM | POA: Insufficient documentation

## 2019-05-16 DIAGNOSIS — M810 Age-related osteoporosis without current pathological fracture: Secondary | ICD-10-CM | POA: Insufficient documentation

## 2019-05-16 DIAGNOSIS — M67441 Ganglion, right hand: Secondary | ICD-10-CM | POA: Diagnosis not present

## 2019-05-16 DIAGNOSIS — I10 Essential (primary) hypertension: Secondary | ICD-10-CM | POA: Diagnosis not present

## 2019-05-16 DIAGNOSIS — Z888 Allergy status to other drugs, medicaments and biological substances status: Secondary | ICD-10-CM | POA: Insufficient documentation

## 2019-05-16 DIAGNOSIS — F419 Anxiety disorder, unspecified: Secondary | ICD-10-CM | POA: Diagnosis not present

## 2019-05-16 DIAGNOSIS — Z833 Family history of diabetes mellitus: Secondary | ICD-10-CM | POA: Insufficient documentation

## 2019-05-16 DIAGNOSIS — F329 Major depressive disorder, single episode, unspecified: Secondary | ICD-10-CM | POA: Diagnosis not present

## 2019-05-16 DIAGNOSIS — Z8249 Family history of ischemic heart disease and other diseases of the circulatory system: Secondary | ICD-10-CM | POA: Diagnosis not present

## 2019-05-16 DIAGNOSIS — M67431 Ganglion, right wrist: Secondary | ICD-10-CM | POA: Insufficient documentation

## 2019-05-16 DIAGNOSIS — E063 Autoimmune thyroiditis: Secondary | ICD-10-CM | POA: Insufficient documentation

## 2019-05-16 DIAGNOSIS — F17200 Nicotine dependence, unspecified, uncomplicated: Secondary | ICD-10-CM | POA: Insufficient documentation

## 2019-05-16 DIAGNOSIS — E890 Postprocedural hypothyroidism: Secondary | ICD-10-CM | POA: Insufficient documentation

## 2019-05-16 DIAGNOSIS — Z79899 Other long term (current) drug therapy: Secondary | ICD-10-CM | POA: Insufficient documentation

## 2019-05-16 DIAGNOSIS — J449 Chronic obstructive pulmonary disease, unspecified: Secondary | ICD-10-CM | POA: Diagnosis not present

## 2019-05-16 DIAGNOSIS — Z7951 Long term (current) use of inhaled steroids: Secondary | ICD-10-CM | POA: Diagnosis not present

## 2019-05-16 DIAGNOSIS — E039 Hypothyroidism, unspecified: Secondary | ICD-10-CM | POA: Diagnosis not present

## 2019-05-16 HISTORY — PX: GANGLION CYST EXCISION: SHX1691

## 2019-05-16 SURGERY — EXCISION, GANGLION CYST, WRIST
Anesthesia: Monitor Anesthesia Care | Site: Wrist | Laterality: Right

## 2019-05-16 MED ORDER — ONDANSETRON HCL 4 MG PO TABS: 4.0000 mg | ORAL_TABLET | Freq: Four times a day (QID) | ORAL | Status: DC | PRN

## 2019-05-16 MED ORDER — POTASSIUM CHLORIDE IN NACL 20-0.9 MEQ/L-% IV SOLN
INTRAVENOUS | Status: DC
  Filled 2019-05-16: qty 1000

## 2019-05-16 MED ORDER — LIDOCAINE HCL (CARDIAC) PF 100 MG/5ML IV SOSY
PREFILLED_SYRINGE | INTRAVENOUS | Status: DC | PRN
  Administered 2019-05-16: 40 mg via INTRAVENOUS

## 2019-05-16 MED ORDER — ONDANSETRON HCL 4 MG/2ML IJ SOLN
4.0000 mg | Freq: Once | INTRAMUSCULAR | Status: AC | PRN
  Administered 2019-05-16: 4 mg via INTRAVENOUS

## 2019-05-16 MED ORDER — GLYCOPYRROLATE 0.2 MG/ML IJ SOLN
INTRAMUSCULAR | Status: DC | PRN
  Administered 2019-05-16: .2 mg via INTRAVENOUS

## 2019-05-16 MED ORDER — PROPOFOL 10 MG/ML IV BOLUS
INTRAVENOUS | Status: DC | PRN
  Administered 2019-05-16: 80 mg via INTRAVENOUS

## 2019-05-16 MED ORDER — DEXAMETHASONE SODIUM PHOSPHATE 10 MG/ML IJ SOLN
INTRAMUSCULAR | Status: AC
  Filled 2019-05-16: qty 1

## 2019-05-16 MED ORDER — ONDANSETRON HCL 4 MG/2ML IJ SOLN
INTRAMUSCULAR | Status: AC
  Filled 2019-05-16: qty 2

## 2019-05-16 MED ORDER — METOCLOPRAMIDE HCL 10 MG PO TABS: 5.0000 mg | ORAL_TABLET | Freq: Three times a day (TID) | ORAL | Status: DC | PRN

## 2019-05-16 MED ORDER — CEFAZOLIN SODIUM-DEXTROSE 2-4 GM/100ML-% IV SOLN
INTRAVENOUS | Status: AC
  Filled 2019-05-16: qty 100

## 2019-05-16 MED ORDER — LIDOCAINE HCL (PF) 2 % IJ SOLN
INTRAMUSCULAR | Status: AC
  Filled 2019-05-16: qty 10

## 2019-05-16 MED ORDER — DEXAMETHASONE SODIUM PHOSPHATE 10 MG/ML IJ SOLN
INTRAMUSCULAR | Status: DC | PRN
  Administered 2019-05-16: 5 mg via INTRAVENOUS

## 2019-05-16 MED ORDER — LIDOCAINE HCL (PF) 1 % IJ SOLN
INTRAMUSCULAR | Status: AC
  Filled 2019-05-16: qty 30

## 2019-05-16 MED ORDER — BUPIVACAINE HCL (PF) 0.5 % IJ SOLN
INTRAMUSCULAR | Status: AC
  Filled 2019-05-16: qty 30

## 2019-05-16 MED ORDER — HYDROCODONE-ACETAMINOPHEN 5-325 MG PO TABS: 1.0000 | ORAL_TABLET | ORAL | Status: DC | PRN

## 2019-05-16 MED ORDER — PROMETHAZINE HCL 25 MG/ML IJ SOLN
6.2500 mg | Freq: Once | INTRAMUSCULAR | Status: AC
  Administered 2019-05-16: 6.25 mg via INTRAVENOUS

## 2019-05-16 MED ORDER — LACTATED RINGERS IV SOLN
INTRAVENOUS | Status: DC
  Administered 2019-05-16 (×2): via INTRAVENOUS

## 2019-05-16 MED ORDER — SODIUM CHLORIDE FLUSH 0.9 % IV SOLN
INTRAVENOUS | Status: AC
  Filled 2019-05-16: qty 10

## 2019-05-16 MED ORDER — FAMOTIDINE 20 MG PO TABS
ORAL_TABLET | ORAL | Status: AC
  Administered 2019-05-16: 20 mg via ORAL
  Filled 2019-05-16: qty 1

## 2019-05-16 MED ORDER — FENTANYL CITRATE (PF) 100 MCG/2ML IJ SOLN
25.0000 ug | INTRAMUSCULAR | Status: DC | PRN
  Administered 2019-05-16 (×2): 25 ug via INTRAVENOUS

## 2019-05-16 MED ORDER — FENTANYL CITRATE (PF) 100 MCG/2ML IJ SOLN
INTRAMUSCULAR | Status: AC
  Administered 2019-05-16: 25 ug via INTRAVENOUS
  Filled 2019-05-16: qty 2

## 2019-05-16 MED ORDER — FENTANYL CITRATE (PF) 100 MCG/2ML IJ SOLN
INTRAMUSCULAR | Status: DC | PRN
  Administered 2019-05-16 (×2): 25 ug via INTRAVENOUS

## 2019-05-16 MED ORDER — ONDANSETRON HCL 4 MG/2ML IJ SOLN
INTRAMUSCULAR | Status: DC | PRN
  Administered 2019-05-16: 4 mg via INTRAVENOUS

## 2019-05-16 MED ORDER — METOCLOPRAMIDE HCL 5 MG/ML IJ SOLN: 5.0000 mg | Freq: Three times a day (TID) | INTRAMUSCULAR | Status: DC | PRN

## 2019-05-16 MED ORDER — BUPIVACAINE HCL (PF) 0.5 % IJ SOLN
INTRAMUSCULAR | Status: DC | PRN
  Administered 2019-05-16: 5 mL

## 2019-05-16 MED ORDER — PROMETHAZINE HCL 25 MG/ML IJ SOLN
INTRAMUSCULAR | Status: AC
  Filled 2019-05-16: qty 1

## 2019-05-16 MED ORDER — FENTANYL CITRATE (PF) 100 MCG/2ML IJ SOLN
INTRAMUSCULAR | Status: AC
  Filled 2019-05-16: qty 2

## 2019-05-16 MED ORDER — ONDANSETRON HCL 4 MG/2ML IJ SOLN: 4.0000 mg | Freq: Four times a day (QID) | INTRAMUSCULAR | Status: DC | PRN

## 2019-05-16 MED ORDER — PROPOFOL 10 MG/ML IV BOLUS
INTRAVENOUS | Status: AC
  Filled 2019-05-16: qty 40

## 2019-05-16 MED ORDER — HYDROCODONE-ACETAMINOPHEN 5-325 MG PO TABS: 1.0000 | ORAL_TABLET | Freq: Four times a day (QID) | ORAL | 0 refills | Status: DC | PRN

## 2019-05-16 MED ORDER — FAMOTIDINE 20 MG PO TABS
20.0000 mg | ORAL_TABLET | Freq: Once | ORAL | Status: AC
  Administered 2019-05-16: 06:00:00 20 mg via ORAL

## 2019-05-16 MED ORDER — PHENYLEPHRINE HCL (PRESSORS) 10 MG/ML IV SOLN
INTRAVENOUS | Status: DC | PRN
  Administered 2019-05-16 (×2): 100 ug via INTRAVENOUS

## 2019-05-16 SURGICAL SUPPLY — 30 items
BNDG COHESIVE 4X5 TAN STRL (GAUZE/BANDAGES/DRESSINGS) ×2 IMPLANT
BNDG ESMARK 4X12 TAN STRL LF (GAUZE/BANDAGES/DRESSINGS) ×2 IMPLANT
CANISTER SUCT 1200ML W/VALVE (MISCELLANEOUS) ×2 IMPLANT
CHLORAPREP W/TINT 26 (MISCELLANEOUS) ×4 IMPLANT
CORD BIP STRL DISP 12FT (MISCELLANEOUS) ×2 IMPLANT
COVER WAND RF STERILE (DRAPES) ×2 IMPLANT
CUFF TOURN SGL QUICK 12 (TOURNIQUET CUFF) ×2 IMPLANT
CUFF TOURN SGL QUICK 18X4 (TOURNIQUET CUFF) IMPLANT
FORCEPS JEWEL BIP 4-3/4 STR (INSTRUMENTS) ×2 IMPLANT
GAUZE SPONGE 4X4 12PLY STRL (GAUZE/BANDAGES/DRESSINGS) ×2 IMPLANT
GAUZE XEROFORM 1X8 LF (GAUZE/BANDAGES/DRESSINGS) ×2 IMPLANT
GLOVE BIO SURGEON STRL SZ8 (GLOVE) ×4 IMPLANT
GLOVE INDICATOR 8.0 STRL GRN (GLOVE) ×2 IMPLANT
GOWN STRL REUS W/ TWL LRG LVL3 (GOWN DISPOSABLE) ×1 IMPLANT
GOWN STRL REUS W/ TWL XL LVL3 (GOWN DISPOSABLE) ×1 IMPLANT
GOWN STRL REUS W/TWL LRG LVL3 (GOWN DISPOSABLE) ×1
GOWN STRL REUS W/TWL XL LVL3 (GOWN DISPOSABLE) ×1
KIT TURNOVER KIT A (KITS) ×2 IMPLANT
NEEDLE HYPO 25X1 1.5 SAFETY (NEEDLE) ×2 IMPLANT
NS IRRIG 500ML POUR BTL (IV SOLUTION) ×2 IMPLANT
PACK EXTREMITY ARMC (MISCELLANEOUS) ×2 IMPLANT
PAD CAST CTTN 4X4 STRL (SOFTGOODS) ×1 IMPLANT
PADDING CAST COTTON 4X4 STRL (SOFTGOODS) ×1
SPLINT WRIST LG LT TX990309 (SOFTGOODS) IMPLANT
SPLINT WRIST M LT TX990308 (SOFTGOODS) IMPLANT
SPLINT WRIST M RT TX990303 (SOFTGOODS) ×2 IMPLANT
STOCKINETTE IMPERVIOUS 9X36 MD (GAUZE/BANDAGES/DRESSINGS) ×2 IMPLANT
SUT PROLENE 4 0 PS 2 18 (SUTURE) ×2 IMPLANT
SUT VIC AB 4-0 SH 27 (SUTURE) ×1
SUT VIC AB 4-0 SH 27XANBCTRL (SUTURE) ×1 IMPLANT

## 2019-05-16 NOTE — H&P (Signed)
Paper H&P to be scanned into permanent record. H&P reviewed and patient re-examined. No changes. 

## 2019-05-16 NOTE — Anesthesia Post-op Follow-up Note (Signed)
Anesthesia QCDR form completed.        

## 2019-05-16 NOTE — Anesthesia Preprocedure Evaluation (Signed)
Anesthesia Evaluation  Patient identified by MRN, date of birth, ID band Patient awake    Reviewed: Allergy & Precautions, NPO status , Patient's Chart, lab work & pertinent test results, reviewed documented beta blocker date and time   Airway Mallampati: II  TM Distance: >3 FB     Dental  (+) Chipped, Upper Dentures, Lower Dentures   Pulmonary COPD, Current Smoker,           Cardiovascular hypertension, Pt. on medications + CAD       Neuro/Psych PSYCHIATRIC DISORDERS Anxiety Depression    GI/Hepatic   Endo/Other  Hypothyroidism   Renal/GU      Musculoskeletal  (+) Arthritis ,   Abdominal   Peds  Hematology   Anesthesia Other Findings EKG ok.  Reproductive/Obstetrics                             Anesthesia Physical Anesthesia Plan  ASA: III  Anesthesia Plan: MAC   Post-op Pain Management:    Induction: Intravenous  PONV Risk Score and Plan:   Airway Management Planned:   Additional Equipment:   Intra-op Plan:   Post-operative Plan:   Informed Consent: I have reviewed the patients History and Physical, chart, labs and discussed the procedure including the risks, benefits and alternatives for the proposed anesthesia with the patient or authorized representative who has indicated his/her understanding and acceptance.       Plan Discussed with: CRNA  Anesthesia Plan Comments:         Anesthesia Quick Evaluation

## 2019-05-16 NOTE — Anesthesia Procedure Notes (Signed)
Procedure Name: LMA Insertion Date/Time: 05/16/2019 7:38 AM Performed by: Chanetta [facility], CRNA Pre-anesthesia Checklist: Patient identified, Emergency Drugs available, Suction available and Patient being monitored Patient Re-evaluated:Patient Re-evaluated prior to induction Oxygen Delivery Method: Circle system utilized Preoxygenation: Pre-oxygenation with 100% oxygen Induction Type: IV induction Ventilation: Mask ventilation without difficulty LMA: LMA inserted LMA Size: 3.0 Tube type: Oral Number of attempts: 1 Airway Equipment and Method: Stylet and Oral airway Placement Confirmation: positive ETCO2,  breath sounds checked- equal and bilateral and CO2 detector Tube secured with: Tape Dental Injury: Teeth and Oropharynx as per pre-operative assessment

## 2019-05-16 NOTE — Op Note (Signed)
05/16/2019  8:41 AM  Patient:   [name] [name]  Pre-Op Diagnosis:   Dorso-ulnar right wrist ganglion.  Post-Op Diagnosis:   Same.  Procedure:   Excision of dorso-ulnar right wrist ganglion.  Surgeon:   Pascal Lux, MD  Assistant:   Orland Penman, PA-S  Anesthesia:   General LMA  Findings:   As above.  Complications:   None  EBL:   0 cc  Fluids:   900 cc crystalloid  TT:   38 minutes at 250 mmHg  Drains:   None  Closure:   4-0 Prolene interrupted sutures  Brief Clinical Note:   The patient is a 73 year old female with a history of a painful fluid-filled soft tissue mass on the dorsal ulnar aspect of the right wrist. This mass and its associated symptoms have persisted despite medications, activity modification, etc. Her history and examination are consistent with a ganglion cyst emanating from the extensor carpi ulnaris tendon sheath. The patient presents at this time for excision of the right dorso-ulnar ganglion cyst.  Procedure:   The patient was brought into the operating room and lain in the supine position.  After adequate general laryngeal mask anesthesia was obtained, the right hand and upper extremity were prepped with ChloraPrep solution before being draped sterilely. Preoperative antibiotics were administered. A timeout was performed to verify the appropriate surgical site before the limb was exsanguinated with an Esmarch and the tourniquet inflated to 250 mmHg. An approximately 2-3 cm incision was made longitudinally over the cyst. The incision was carried down through the subcutaneous tissues with care taken to identify and protect any neurovascular structures. A portion of the extensor retinaculum was incised to permit further access to the cyst.  The cyst was identified.  During its dissection, it popped, emitting a small amount of clear mildly gelatinous fluid, consistent with a ganglion cyst. The ECU tendon was inspected and noted to have moderate tendinopathic  changes. There also appeared to be significant tenosynovium around the tendon which was debrided thoroughly.  The wound was copiously irrigated with sterile saline solution before the extensor retinaculum was reapproximated using 3-0 Vicryl interrupted sutures. The skin was closed using 4-0 Prolene interrupted sutures. A sterile bulky dressing was applied to the wrist. A total of 5 cc of 0.5% plain Sensorcaine was injected in and around the incision to help with postoperative analgesia prior to placement of the dressing. A Velcro wrist immobilizer also was applied to the wrist. The patient was then awakened, extubated, and returned to the recovery room in satisfactory condition after tolerating the procedure well.

## 2019-05-16 NOTE — Anesthesia Postprocedure Evaluation (Signed)
Anesthesia Post Note  Patient: [name] [name]  Procedure(s) Performed: REMOVAL GANGLION OF WRIST (Right Wrist)  Patient location during evaluation: PACU Anesthesia Type: MAC Level of consciousness: awake and alert Pain management: pain level controlled Vital Signs Assessment: post-procedure vital signs reviewed and stable Respiratory status: spontaneous breathing, nonlabored ventilation, respiratory function stable and patient connected to nasal cannula oxygen Cardiovascular status: stable and blood pressure returned to baseline Postop Assessment: no apparent nausea or vomiting Anesthetic complications: no     Last Vitals:  Vitals:   05/16/19 1007 05/16/19 1041  BP: (!) 163/77 (!) 154/70  Pulse: 69 70  Resp: 18 18  Temp: (!) 36.2 C   SpO2: 100% 97%    Last Pain:  Vitals:   05/16/19 1041  TempSrc:   PainSc: 0-No pain                 Kerrie Timm S

## 2019-05-16 NOTE — Transfer of Care (Signed)
Immediate Anesthesia Transfer of Care Note  Patient: [name] [name]  Procedure(s) Performed: REMOVAL GANGLION OF WRIST (Right Wrist)  Patient Location: PACU  Anesthesia Type:General  Level of Consciousness: awake, alert  and oriented  Airway & Oxygen Therapy: Patient Spontanous Breathing and Patient connected to face mask oxygen  Post-op Assessment: Report given to RN and Post -op Vital signs reviewed and stable  Post vital signs: Reviewed and stable  Last Vitals:  Vitals Value Taken Time  BP 121/62 05/16/19 0833  Temp    Pulse 76 05/16/19 0834  Resp 25 05/16/19 0834  SpO2 98 % 05/16/19 0834  Vitals shown include unvalidated device data.  Last Pain:  Vitals:   05/16/19 0547  TempSrc: Temporal  PainSc: 7          Complications: No apparent anesthesia complications

## 2019-05-16 NOTE — Discharge Instructions (Addendum)
Orthopedic discharge instructions: °Keep dressing dry and intact. °Keep hand elevated above heart level. °May shower after dressing removed on postop day 4 (Sunday). °Cover sutures with Band-Aids after drying off. °Apply ice to affected area frequently. °Take ibuprofen 600 mg TID with meals for 7-10 days, then as necessary. °Take ES Tylenol or pain medication as prescribed when needed.  °Return for follow-up in 10-14 days or as scheduled. ° °AMBULATORY SURGERY  °DISCHARGE INSTRUCTIONS ° ° °1) The drugs that you were given will stay in your system until tomorrow so for the next 24 hours you should not: ° °A) Drive an automobile °B) Make any legal decisions °C) Drink any alcoholic beverage ° ° °2) You may resume regular meals tomorrow.  Today it is better to start with liquids and gradually work up to solid foods. ° °You may eat anything you prefer, but it is better to start with liquids, then soup and crackers, and gradually work up to solid foods. ° ° °3) Please notify your doctor immediately if you have any unusual bleeding, trouble breathing, redness and pain at the surgery site, drainage, fever, or pain not relieved by medication. ° ° ° °4) Additional Instructions: ° ° ° ° ° ° ° °Please contact your physician with any problems or Same Day Surgery at [phone], Monday through Friday 6 am to 4 pm, or [facility] number at [phone]. °

## 2019-05-17 DIAGNOSIS — M67441 Ganglion, right hand: Secondary | ICD-10-CM | POA: Insufficient documentation

## 2019-05-17 LAB — SURGICAL PATHOLOGY

## 2019-05-21 ENCOUNTER — Other Ambulatory Visit: Payer: Self-pay | Admitting: Gastroenterology

## 2019-05-22 ENCOUNTER — Other Ambulatory Visit: Payer: Self-pay | Admitting: *Deleted

## 2019-05-22 NOTE — Telephone Encounter (Signed)
Is this early? 

## 2019-05-22 NOTE — Telephone Encounter (Signed)
Received fax from [facility] requesting Rx to be xfered to them.  Name of Medication: lorazepam 1 mg Name of Pharmacy:[facility]  Last Fill or Written Date and Quantity: 03/22/19 #30 tabs with 2 refills Last Office Visit and Type: 12/29/18 HFU Next Office Visit and Type:none scheduled

## 2019-05-23 MED ORDER — LORAZEPAM 1 MG PO TABS: 1.0000 mg | ORAL_TABLET | Freq: Every evening | ORAL | 0 refills | Status: DC | PRN

## 2019-05-23 NOTE — Telephone Encounter (Signed)
Per state database last filled on 04/27/19, they are requesting her to get it from a mail order pharmacy going forward not her local pharmacy. They can not xfer the last refill on file due to it being a controlled sub they would need a new refill sent to them

## 2019-06-01 DIAGNOSIS — M67441 Ganglion, right hand: Secondary | ICD-10-CM | POA: Diagnosis not present

## 2019-06-19 ENCOUNTER — Other Ambulatory Visit: Payer: Self-pay | Admitting: Family Medicine

## 2019-07-06 IMAGING — DX DG LUMBAR SPINE COMPLETE 4+V
5 series · 5 of 5 positions shown · non-contrast
Comparison: Chest radiograph 11/21/2016. Thoracic spine MR
01/28/2016

CLINICAL DATA: Lower back pain. History of T9 compression fracture.
Fell about 1 month ago.

EXAM:
LUMBAR SPINE - COMPLETE 4+ VIEW

[l-spine ap]
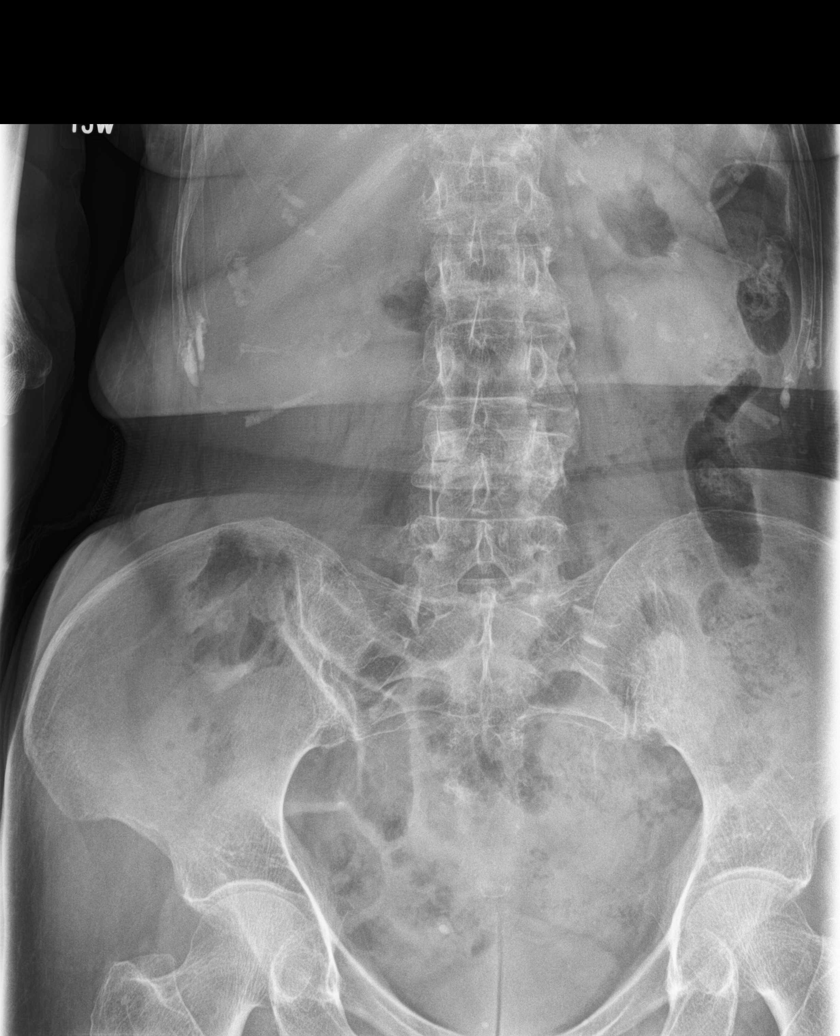

[l-spine obl (1 of 2)]
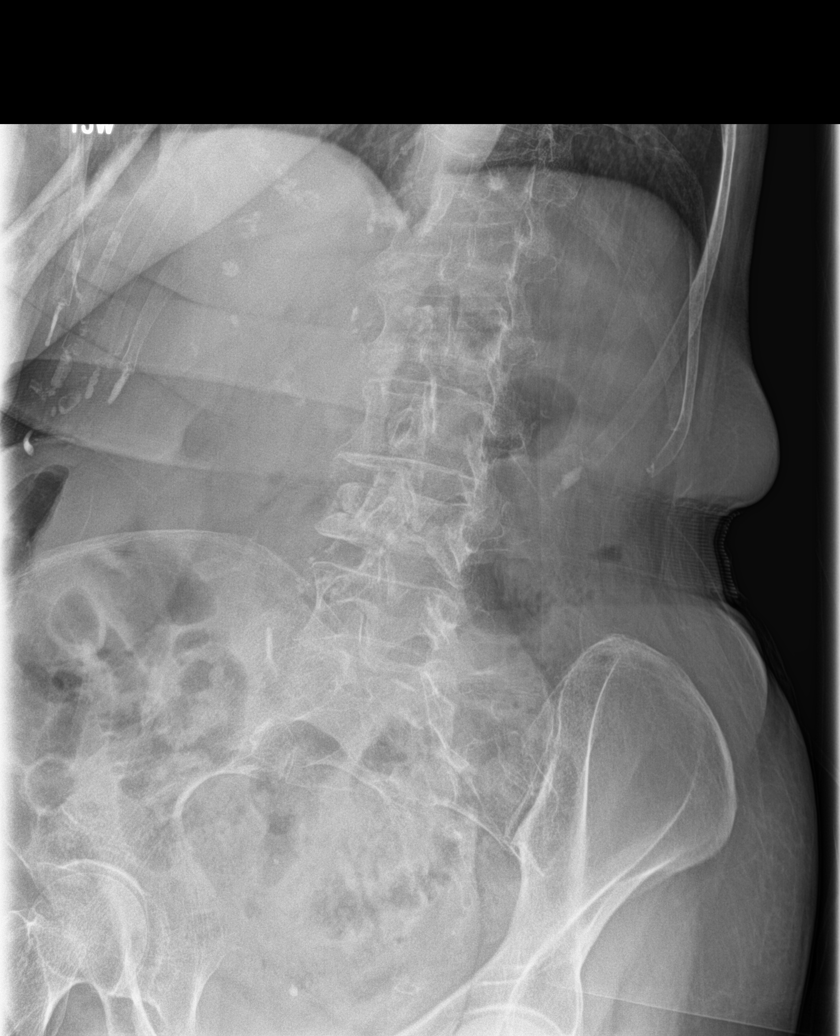

[l-spine obl (2 of 2)]
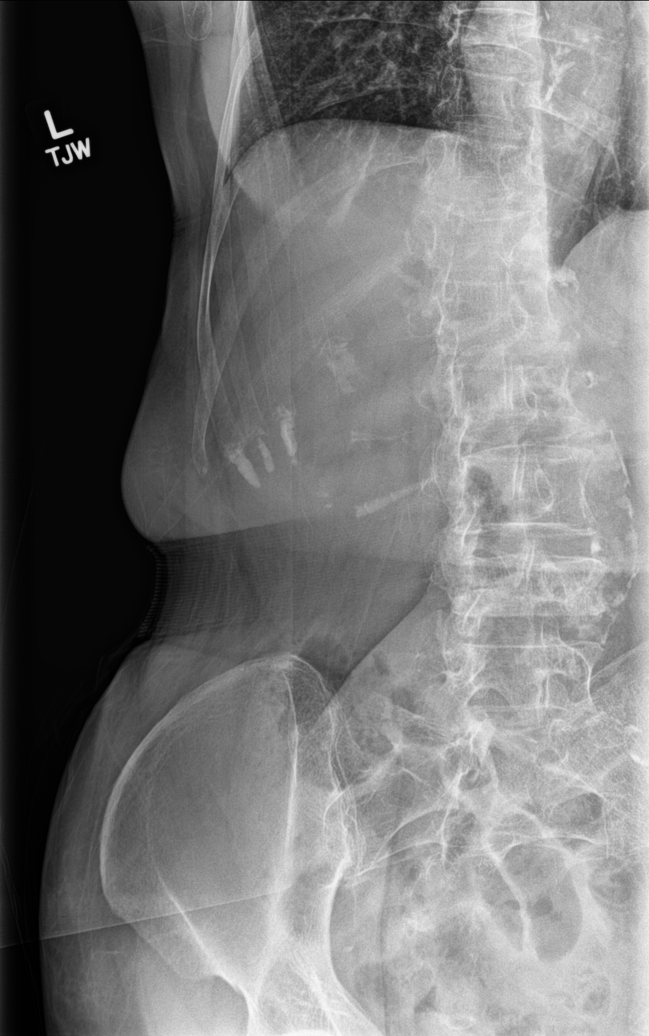

[l-spine lat]
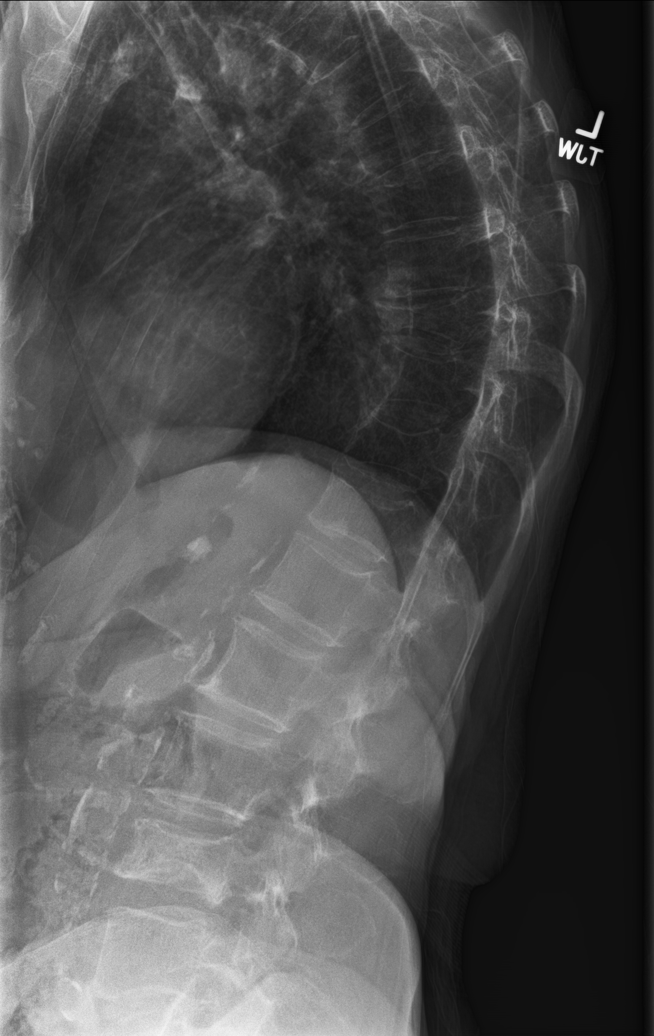

[l-spine l5/s1]
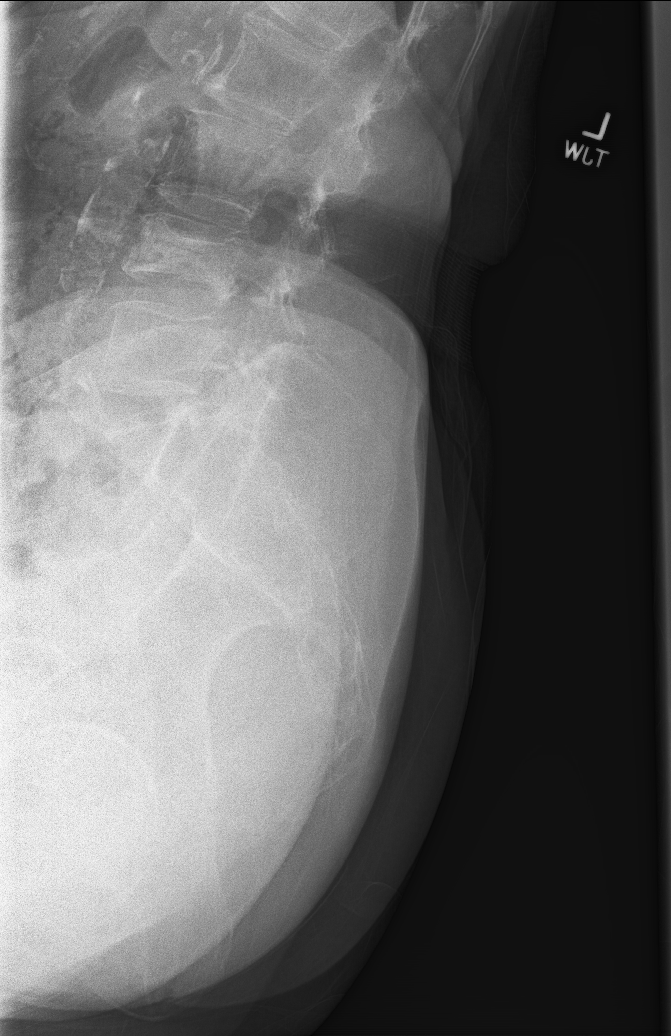

[5 of 5 positions shown; findings below may reference images not displayed]

FINDINGS: Compression deformity involving the L4 vertebral body appears to be
new since November 2016. Vertebral body is slightly dense suggesting
this could represent a subacute or even early chronic fracture.
There is approximately 20% loss of vertebral body height loss at L4.
Again noted is an old compression fracture at T9. Alignment of the
lumbar spine is normal. Diffuse atherosclerotic calcifications in
the aorta. No evidence for a pars defect.
IMPRESSION: L4 compression fracture of uncertain age. This could be better
characterized with a lumbar spine MRI if needed.

Old compression fracture at T9.

These results will be called to the ordering clinician or
representative by the Radiologist Assistant, and communication
documented in the PACS or zVision Dashboard.

## 2019-09-19 ENCOUNTER — Other Ambulatory Visit: Payer: Self-pay | Admitting: *Deleted

## 2019-09-19 MED ORDER — LORAZEPAM 1 MG PO TABS: 1.0000 mg | ORAL_TABLET | Freq: Every evening | ORAL | 0 refills | Status: DC | PRN

## 2019-09-19 NOTE — Telephone Encounter (Signed)
Name of Medication:lorazepam 1 mg Name of [facility] or Written Date and Quantity:05/23/19 #90 tabs with 0 refill Last Office Visit and Type:12/29/18 HFU Next Office Visit and Type:none scheduled

## 2019-09-25 ENCOUNTER — Telehealth: Payer: Self-pay

## 2019-09-25 NOTE — Telephone Encounter (Signed)
Discussed Prolia benefits w/pt.  Pt would owe approx $255.  Pt understands and agrees.

## 2019-10-11 ENCOUNTER — Ambulatory Visit (INDEPENDENT_AMBULATORY_CARE_PROVIDER_SITE_OTHER): Payer: Medicare HMO

## 2019-10-11 ENCOUNTER — Other Ambulatory Visit: Payer: Self-pay

## 2019-10-11 DIAGNOSIS — M8000XS Age-related osteoporosis with current pathological fracture, unspecified site, sequela: Secondary | ICD-10-CM

## 2019-10-11 MED ORDER — DENOSUMAB 60 MG/ML ~~LOC~~ SOSY
60.0000 mg | PREFILLED_SYRINGE | Freq: Once | SUBCUTANEOUS | Status: AC
  Administered 2019-10-11: 60 mg via SUBCUTANEOUS

## 2019-10-11 NOTE — Progress Notes (Signed)
Per orders of Dr. Duncan, injection of Prolia given by [place] Every. Patient tolerated injection well.  

## 2019-10-11 NOTE — Telephone Encounter (Signed)
Pt received Prolia inj today.  FYI to Charmaine.  

## 2019-10-26 ENCOUNTER — Other Ambulatory Visit: Payer: Self-pay | Admitting: Family Medicine

## 2019-10-26 MED ORDER — NORTRIPTYLINE HCL 25 MG PO CAPS: 25.0000 mg | ORAL_CAPSULE | Freq: Every day | ORAL | 0 refills | Status: DC

## 2019-10-26 MED ORDER — NORTRIPTYLINE HCL 75 MG PO CAPS: 75.0000 mg | ORAL_CAPSULE | Freq: Every day | ORAL | 0 refills | Status: DC

## 2019-10-26 NOTE — Telephone Encounter (Signed)
It looks like she takes both a 25 mg and a 75 mg of nortriptyline each bedtime I pended to send to [facility] send for just the 75 mg  Please verify what she is taking and send   Schedule PE or f/u this spring if able  Thanks

## 2019-10-26 NOTE — Telephone Encounter (Signed)
Pt is taking both meds refilled and Morey Hummingbird will reach out to pt to try and schedule CPE when able

## 2019-10-30 ENCOUNTER — Other Ambulatory Visit: Payer: Self-pay | Admitting: *Deleted

## 2019-10-30 MED ORDER — LEVOTHYROXINE SODIUM 75 MCG PO TABS: 75.0000 ug | ORAL_TABLET | Freq: Every day | ORAL | 0 refills | Status: DC

## 2019-11-02 ENCOUNTER — Telehealth: Payer: Self-pay

## 2019-11-02 NOTE — Telephone Encounter (Signed)
Pt called to say she is not able to sleep even when she takes the lorazepam. Said she cannot go any longer without sleep. She also takes nortriptylene. Asking what else she can do.

## 2019-11-02 NOTE — Telephone Encounter (Signed)
Patient scheduled appointment on 11/05/19.

## 2019-11-02 NOTE — Telephone Encounter (Signed)
Please schedule an office visit.

## 2019-11-05 ENCOUNTER — Ambulatory Visit (INDEPENDENT_AMBULATORY_CARE_PROVIDER_SITE_OTHER): Payer: Medicare HMO | Admitting: Family Medicine

## 2019-11-05 ENCOUNTER — Other Ambulatory Visit: Payer: Self-pay

## 2019-11-05 ENCOUNTER — Encounter: Payer: Self-pay | Admitting: Family Medicine

## 2019-11-05 ENCOUNTER — Telehealth: Payer: Self-pay | Admitting: Family Medicine

## 2019-11-05 VITALS — BP 126/74 | HR 80 | Temp 98.1°F | Ht 62.5 in | Wt 112.0 lb

## 2019-11-05 DIAGNOSIS — E890 Postprocedural hypothyroidism: Secondary | ICD-10-CM | POA: Diagnosis not present

## 2019-11-05 DIAGNOSIS — F419 Anxiety disorder, unspecified: Secondary | ICD-10-CM

## 2019-11-05 DIAGNOSIS — M542 Cervicalgia: Secondary | ICD-10-CM | POA: Diagnosis not present

## 2019-11-05 DIAGNOSIS — G479 Sleep disorder, unspecified: Secondary | ICD-10-CM | POA: Diagnosis not present

## 2019-11-05 DIAGNOSIS — I700 Atherosclerosis of aorta: Secondary | ICD-10-CM | POA: Diagnosis not present

## 2019-11-05 LAB — TSH: TSH: 0.07 u[IU]/mL — ABNORMAL LOW (ref 0.35–4.50)

## 2019-11-05 MED ORDER — CYCLOBENZAPRINE HCL 10 MG PO TABS: 10.0000 mg | ORAL_TABLET | Freq: Every day | ORAL | 1 refills | Status: DC

## 2019-11-05 NOTE — Assessment & Plan Note (Signed)
Acute on chronic insomnia (cannot fall asleep) for 2 weeks  Prev controlled on pamelor and ativan   Has tried melatonin and antihistamines and restoril in the past-do not work  Neck pain Gwenyth Bouillon are problematic lately also  Also anx/depression (? Which causes which in terms of insomnia ) Disc sleep hygiene  Disc stopping caffeine entirely (she will) Need for exercise as tolerated  Will check TSH today  Trial of cyclobenzaprine for sleep and neck pain with caution for sedation and falls   If not effective-has not tried trazodone or mitrazapine or rozerem in the past

## 2019-11-05 NOTE — Assessment & Plan Note (Signed)
Aches-worse with rotation  Makes headache worse as well -especially bad w/o sleep  Also adds to sleeplessness  Trial of flexeril with extreme caution of sedation/falls to help with pain and sleep

## 2019-11-05 NOTE — Assessment & Plan Note (Signed)
Lack of sleep is worsening this and vice versa  Continues nortriptyline and ativan  May consider trial of trazodone or mirtazapine in the future

## 2019-11-05 NOTE — Patient Instructions (Addendum)
Please change to non caffeine tea   Get exercise during the day  Get outside when weather allows   It is better to read a book than watch TV when you cannot sleep  Let's check your thyroid today   I want you to try cyclobenzaprine (generic flexeril) for sleep (it is also a muscle relaxer that may help neck and head pain )  Use with caution as it can be sedating and cause falls

## 2019-11-05 NOTE — Telephone Encounter (Signed)
We need to decrease her thyroid supplementation  This may be what worsened her sleep  She takes levothyroxine-please verify 75 mcg daily   If that is the case = I want to take her down to 25 mcg daily and re check in about a month  Let me know

## 2019-11-05 NOTE — Assessment & Plan Note (Signed)
More anxious and jittery lately with insomnia Will check TSH today

## 2019-11-05 NOTE — Assessment & Plan Note (Signed)
No clinical changes Working for good cholesterol control

## 2019-11-05 NOTE — Progress Notes (Signed)
Subjective:    Patient ID: [name] [name], female    DOB: [birth year], 74 y.o.   MRN: [id number]  This visit occurred during the SARS-CoV-2 public health emergency.  Safety protocols were in place, including screening questions prior to the visit, additional usage of staff PPE, and extensive cleaning of exam room while observing appropriate contact time as indicated for disinfecting solutions.    HPI Pt presents with acute on chronic insomnia   Wt Readings from Last 3 Encounters:  11/05/19 112 lb (50.8 kg)  05/11/19 109 lb 1.6 oz (49.5 kg)  04/23/19 110 lb (49.9 kg)  up 3 lb  Appetite is pretty good  20.16 kg/m   Not smoking at all   Pt has had sleep problems for years (since her 26s)  Has taken nortryptiline  and temazepam which stopped working    She takes 100 mg of nortriptyline each bedtime   Lorazepam 1 mg at bedtime   That combo worked until 2 weeks ago  No changes in stress  Neck hurts but not at night   Gets headaches when she cannot sleep   Trouble falling asleep - previously it took about an hour  Now it is not happening- does not go to sleep at all  She gets up and watches TV  Does not sleep during the day   Has been more anxious- shaky at times  She thinks it started before the worsened sleep  She lies in bed and her brain keeps going /awake  More depressed lately  Not suicidal   She is stressed about feeling tired all the time  The pandemic bothers her some   She had not had the covid vaccines yet Had covid in April   She drinks tea through the day /caffinated     H/o post op hypothyroid Lab Results  Component Value Date   TSH 3.03 02/06/2019   ? If thyroid is off   She has tried melatonin-no help  Also benadryl   Does not think she has been on [place]  Never tried trazodone gabapentin  Never tried mirtazapine   Patient Active Problem List   Diagnosis Date Noted  . COVID-19 virus infection 12/25/2018  . Medicare annual wellness  visit, subsequent 10/16/2018  . Skin lesion of face 10/16/2018  . Benign neoplasm of ascending colon   . Benign neoplasm of transverse colon   . Melanosis, colon   . Other specified diseases of esophagus   . Stomach irritation   . Constipation 01/24/2018  . Compression fracture of L1 vertebra (HCC) 01/24/2018  . Smoker 01/09/2018  . Anxiety 01/09/2018  . Coronary artery calcification seen on CAT scan 11/25/2017  . Chest pain 11/22/2017  . Generalized abdominal pain 11/01/2017  . Special screening for malignant neoplasms, colon 11/01/2017  . Estrogen deficiency 09/26/2017  . Aortic calcification ([place]) 11/30/2016  . History of patellar fracture 11/30/2016  . Routine general medical examination at a health care facility 06/27/2016  . Abdominal bloating 06/15/2016  . Dyspepsia 06/04/2016  . Loss of weight 06/04/2016  . History of vertebral compression fracture 01/19/2016  . Esophageal dysphagia 06/28/2014  . Chronic foot pain 10/27/2011  . Low back pain 04/14/2011  . FEVER BLISTER 11/17/2010  . Essential hypertension 08/18/2009  . BUNION 03/04/2009  . Allergic rhinitis 01/10/2008  . H/O goiter 02/09/2007  . Hypothyroidism 02/09/2007  . HYPERCHOLESTEROLEMIA 02/09/2007  . History of depression 02/09/2007  . OSTEOARTHRITIS 02/09/2007  . Osteoporosis 02/09/2007  . Disturbance in  sleep behavior 02/09/2007   Past Medical History:  Diagnosis Date  . COPD (chronic obstructive pulmonary disease) ([place])   . Depression   . Hyperlipidemia   . Hypertension   . Hypothyroidism   . Osteoarthritis   . Osteoporosis   . Shingles    arm 1/12  . Sleep disorder   . Tobacco abuse    past; quit 09   Past Surgical History:  Procedure Laterality Date  . ABDOMINAL HYSTERECTOMY    . BIOPSY THYROID     pos neoplasm, surgery 09/2006  . bunions  06/1998  . COLONOSCOPY WITH PROPOFOL N/A 03/15/2018   Procedure: COLONOSCOPY WITH PROPOFOL;  Surgeon: Virgel Manifold, MD;  Location: [facility]  ENDOSCOPY;  Service: Endoscopy;  Laterality: N/A;  . CORONARY ANGIOPLASTY    . CXR-copd, ts partial compression fracture  12/2006  . dexa  12/1996  . ESOPHAGOGASTRODUODENOSCOPY (EGD) WITH PROPOFOL N/A 03/15/2018   Procedure: ESOPHAGOGASTRODUODENOSCOPY (EGD) WITH PROPOFOL;  Surgeon: Virgel Manifold, MD;  Location: [facility] ENDOSCOPY;  Service: Endoscopy;  Laterality: N/A;  . GANGLION CYST EXCISION Right 05/16/2019   Procedure: REMOVAL GANGLION OF WRIST;  Surgeon: Corky Mull, MD;  Location: [facility] ORS;  Service: Orthopedics;  Laterality: Right;  . hashimoto  02/1997  . hyerectomy- cervical ca cells    . LEFT HEART CATH AND CORONARY ANGIOGRAPHY Left 12/23/2017   Procedure: LEFT HEART CATH AND CORONARY ANGIOGRAPHY;  Surgeon: Minna Merritts, MD;  Location: [facility];  Service: Cardiovascular;  Laterality: Left;  . left wrist fracture     surgery  . right thyroid lobectomy, goiter, thyroiditis  12/2006  . stress fractures foot    . THYROIDECTOMY  11/2009   Social History   Tobacco Use  . Smoking status: Former Smoker    Packs/day: 0.50    Types: Cigarettes    Quit date: 08/07/2019    Years since quitting: 0.2  . Smokeless tobacco: Never Used  Substance Use Topics  . Alcohol use: No    Alcohol/week: 0.0 standard drinks  . Drug use: No   Family History  Problem Relation Age of Onset  . Stroke Father   . Hypertension Father   . Diabetes Mother   . Coronary artery disease Mother    Allergies  Allergen Reactions  . Fosamax [Alendronate Sodium]     Dysphagia and heartburn   . Raloxifene Hcl Other (See Comments)    Leg pain and cramps  Leg pain and cramps    Current Outpatient Medications on File Prior to Visit  Medication Sig Dispense Refill  . albuterol (PROVENTIL HFA;VENTOLIN HFA) 108 (90 BASE) MCG/ACT inhaler Inhale 2 puffs into the lungs every 4 (four) hours as needed for wheezing. 1 Inhaler 11  . atorvastatin (LIPITOR) 80 MG tablet Take 1 tablet (80 mg total) by mouth  daily. 90 tablet 3  . Calcium Carbonate-Vit D-Min (CALCIUM 1200 PO) Take 1 tablet by mouth daily.     Marland Kitchen HYDROcodone-acetaminophen (NORCO) 5-325 MG tablet Take 1-2 tablets by mouth every 6 (six) hours as needed for moderate pain. MAXIMUM TOTAL ACETAMINOPHEN DOSE IS 4000 MG PER DAY 15 tablet 0  . lactulose (CHRONULAC) 10 GM/15ML solution Take 15-30 mL by mouth once daily for 3 days or until you have a bowel movement 100 mL 0  . levothyroxine (SYNTHROID) 75 MCG tablet Take 1 tablet (75 mcg total) by mouth daily before breakfast. 90 tablet 0  . lisinopril (PRINIVIL,ZESTRIL) 10 MG tablet Take 1 tablet (10 mg total)  by mouth daily. 90 tablet 3  . loratadine (CLARITIN) 10 MG tablet Take 1 tablet (10 mg total) by mouth daily.    Marland Kitchen LORazepam (ATIVAN) 1 MG tablet Take 1 tablet (1 mg total) by mouth at bedtime as needed for sleep. 90 tablet 0  . nortriptyline (PAMELOR) 25 MG capsule Take 1 capsule (25 mg total) by mouth at bedtime. Along with the 75 mg capsule 90 capsule 0  . nortriptyline (PAMELOR) 75 MG capsule Take 1 capsule (75 mg total) by mouth at bedtime. 90 capsule 0  . triamcinolone (KENALOG) 0.025 % cream Apply 1 application topically 2 (two) times daily. To affected itchy areas (Patient taking differently: Apply 1 application topically 2 (two) times daily as needed (itchy skin). To affected itchy areas) 15 g 1  . ezetimibe (ZETIA) 10 MG tablet Take 1 tablet (10 mg total) by mouth daily. 90 tablet 3   No current facility-administered medications on file prior to visit.    Review of Systems  Constitutional: Negative for activity change, appetite change, fatigue, fever and unexpected weight change.  HENT: Negative for congestion, ear pain, rhinorrhea, sinus pressure and sore throat.   Eyes: Negative for pain, redness and visual disturbance.  Respiratory: Negative for cough, shortness of breath and wheezing.   Cardiovascular: Negative for chest pain and palpitations.  Gastrointestinal: Negative for  abdominal pain, blood in stool, constipation and diarrhea.  Endocrine: Negative for polydipsia and polyuria.  Genitourinary: Negative for dysuria, frequency and urgency.  Musculoskeletal: Positive for neck pain. Negative for arthralgias, back pain and myalgias.  Skin: Negative for pallor and rash.  Allergic/Immunologic: Negative for environmental allergies.  Neurological: Positive for headaches. Negative for dizziness and syncope.       Gets headache when she does not sleep well  Hematological: Negative for adenopathy. Does not bruise/bleed easily.  Psychiatric/Behavioral: Positive for dysphoric mood and sleep disturbance. Negative for behavioral problems, confusion, decreased concentration, hallucinations, self-injury and suicidal ideas. The patient is nervous/anxious.        Objective:   Physical Exam Constitutional:      General: She is not in acute distress.    Appearance: Normal appearance. She is well-developed and normal weight. She is not ill-appearing or diaphoretic.     Comments: Frail appearing   HENT:     Head: Normocephalic and atraumatic.     Mouth/Throat:     Mouth: Mucous membranes are moist.  Eyes:     General: No scleral icterus.    Conjunctiva/sclera: Conjunctivae normal.     Pupils: Pupils are equal, round, and reactive to light.  Neck:     Thyroid: No thyromegaly.     Vascular: No carotid bruit or JVD.  Cardiovascular:     Rate and Rhythm: Normal rate and regular rhythm.     Heart sounds: Normal heart sounds. No gallop.   Pulmonary:     Effort: Pulmonary effort is normal. No respiratory distress.     Breath sounds: Normal breath sounds. No wheezing or rales.  Abdominal:     General: Bowel sounds are normal. There is no distension or abdominal bruit.     Palpations: Abdomen is soft. There is no mass.     Tenderness: There is no abdominal tenderness.  Musculoskeletal:     Cervical back: Normal range of motion and neck supple. No tenderness.     Right lower  leg: No edema.     Left lower leg: No edema.  Lymphadenopathy:     Cervical: No cervical  adenopathy.  Skin:    General: Skin is warm and dry.     Coloration: Skin is not pale.     Findings: No erythema or rash.  Neurological:     Mental Status: She is alert. Mental status is at baseline.     Cranial Nerves: No cranial nerve deficit.     Motor: Tremor present. No weakness.     Coordination: Coordination is intact.     Gait: Gait normal.     Deep Tendon Reflexes: Reflexes are normal and symmetric.     Comments: Mild hand tremor   Psychiatric:        Attention and Perception: Attention normal.        Mood and Affect: Mood is anxious.        Speech: Speech normal.        Behavior: Behavior normal.        Cognition and Memory: Cognition and memory normal.     Comments: Mildly anxious            Assessment & Plan:   Problem List Items Addressed This Visit      Cardiovascular and Mediastinum   Aortic calcification (HCC)    No clinical changes Working for good cholesterol control        Endocrine   Hypothyroidism    More anxious and jittery lately with insomnia Will check TSH today       Relevant Orders   TSH (Completed)     Other   Disturbance in sleep behavior - Primary    Acute on chronic insomnia (cannot fall asleep) for 2 weeks  Prev controlled on pamelor and ativan   Has tried melatonin and antihistamines and restoril in the past-do not work  Neck pain Gwenyth Bouillon are problematic lately also  Also anx/depression (? Which causes which in terms of insomnia ) Disc sleep hygiene  Disc stopping caffeine entirely (she will) Need for exercise as tolerated  Will check TSH today  Trial of cyclobenzaprine for sleep and neck pain with caution for sedation and falls   If not effective-has not tried trazodone or mitrazapine or rozerem in the past       Anxiety    Lack of sleep is worsening this and vice versa  Continues nortriptyline and ativan  May consider  trial of trazodone or mirtazapine in the future       Neck pain    Aches-worse with rotation  Makes headache worse as well -especially bad w/o sleep  Also adds to sleeplessness  Trial of flexeril with extreme caution of sedation/falls to help with pain and sleep

## 2019-11-06 MED ORDER — LEVOTHYROXINE SODIUM 25 MCG PO TABS: 25.0000 ug | ORAL_TABLET | Freq: Every day | ORAL | 1 refills | Status: DC

## 2019-11-06 NOTE — Telephone Encounter (Signed)
Pt notified of lab results and Dr. Marliss Coots comments. Pt is on 75 mcg of med so 25 mcg sent to local pharmacy and lab appt scheduled

## 2019-11-23 ENCOUNTER — Ambulatory Visit (INDEPENDENT_AMBULATORY_CARE_PROVIDER_SITE_OTHER)
Admission: RE | Admit: 2019-11-23 | Discharge: 2019-11-23 | Disposition: A | Discharge status: Home / Self Care | Payer: Medicare HMO | Source: Ambulatory Visit | Attending: Family Medicine | Admitting: Family Medicine

## 2019-11-23 ENCOUNTER — Ambulatory Visit (INDEPENDENT_AMBULATORY_CARE_PROVIDER_SITE_OTHER): Payer: Medicare HMO | Admitting: Family Medicine

## 2019-11-23 ENCOUNTER — Encounter: Payer: Self-pay | Admitting: Family Medicine

## 2019-11-23 ENCOUNTER — Other Ambulatory Visit: Payer: Self-pay

## 2019-11-23 VITALS — BP 141/88 | HR 64 | Temp 97.1°F | Ht 62.5 in | Wt 113.5 lb

## 2019-11-23 DIAGNOSIS — M542 Cervicalgia: Secondary | ICD-10-CM

## 2019-11-23 DIAGNOSIS — I10 Essential (primary) hypertension: Secondary | ICD-10-CM | POA: Diagnosis not present

## 2019-11-23 NOTE — Patient Instructions (Addendum)
Try warm and cold compresses on your neck - see which feels best   Xray today - and we call you with a result   Try tylenol - as directed as needed for pain  You can use flexeril I gave you at night if it helps

## 2019-11-23 NOTE — Progress Notes (Signed)
Subjective:    Patient ID: [name] [name], female    DOB: 11-10-1945, 74 y.o.   MRN: [id number]  This visit occurred during the SARS-CoV-2 public health emergency.  Safety protocols were in place, including screening questions prior to the visit, additional usage of staff PPE, and extensive cleaning of exam room while observing appropriate contact time as indicated for disinfecting solutions.    HPI Pt presents with neck pain  Addressed at 3/1 visit and px flexeril to try cautiously in pm  She has osteoporosis   Wt Readings from Last 3 Encounters:  11/23/19 113 lb 8 oz (51.5 kg)  11/05/19 112 lb (50.8 kg)  05/11/19 109 lb 1.6 oz (49.5 kg)   20.43 kg/m   This has been worse lately  More on the R side  Dull ache  All day and night  Hard to turn her neck  Sleeps on her side -helps to have a good pillow   Tried flexeril- did not help at all  Made her sleep a little better however   Has not tried heat or ice  No pain medicines     A CT of cervical spine done in 2018 showed IMPRESSION: 1. No acute intracranial abnormality or displaced calvarial fracture. 2. No acute fracture or dislocation of the cervical spine. 3. Mild chronic microvascular ischemic changes of the brain. 4. Thyroidectomy and left-sided laryngocele.   Electronically Signed   By: Kristine Garbe M.D.   On: 10/18/2016 03:23   bp is stable today  No cp or palpitations or headaches or edema  No side effects to medicines  BP Readings from Last 3 Encounters:  11/23/19 (!) 178/94  11/05/19 126/74  05/16/19 (!) 154/70     Patient Active Problem List   Diagnosis Date Noted  . Neck pain 11/05/2019  . COVID-19 virus infection 12/25/2018  . Medicare annual wellness visit, subsequent 10/16/2018  . Skin lesion of face 10/16/2018  . Benign neoplasm of ascending colon   . Benign neoplasm of transverse colon   . Melanosis, colon   . Other specified diseases of esophagus   . Stomach irritation    . Constipation 01/24/2018  . Compression fracture of L1 vertebra (HCC) 01/24/2018  . Smoker 01/09/2018  . Anxiety 01/09/2018  . Coronary artery calcification seen on CAT scan 11/25/2017  . Chest pain 11/22/2017  . Generalized abdominal pain 11/01/2017  . Special screening for malignant neoplasms, colon 11/01/2017  . Estrogen deficiency 09/26/2017  . Aortic calcification ([place]) 11/30/2016  . History of patellar fracture 11/30/2016  . Routine general medical examination at a health care facility 06/27/2016  . Abdominal bloating 06/15/2016  . Dyspepsia 06/04/2016  . Loss of weight 06/04/2016  . History of vertebral compression fracture 01/19/2016  . Esophageal dysphagia 06/28/2014  . Chronic foot pain 10/27/2011  . Low back pain 04/14/2011  . FEVER BLISTER 11/17/2010  . Essential hypertension 08/18/2009  . BUNION 03/04/2009  . Allergic rhinitis 01/10/2008  . H/O goiter 02/09/2007  . Hypothyroidism 02/09/2007  . HYPERCHOLESTEROLEMIA 02/09/2007  . History of depression 02/09/2007  . OSTEOARTHRITIS 02/09/2007  . Osteoporosis 02/09/2007  . Disturbance in sleep behavior 02/09/2007   Past Medical History:  Diagnosis Date  . COPD (chronic obstructive pulmonary disease) ([place])   . Depression   . Hyperlipidemia   . Hypertension   . Hypothyroidism   . Osteoarthritis   . Osteoporosis   . Shingles    arm 1/12  . Sleep disorder   . Tobacco  abuse    past; quit 09   Past Surgical History:  Procedure Laterality Date  . ABDOMINAL HYSTERECTOMY    . BIOPSY THYROID     pos neoplasm, surgery 09/2006  . bunions  06/1998  . COLONOSCOPY WITH PROPOFOL N/A 03/15/2018   Procedure: COLONOSCOPY WITH PROPOFOL;  Surgeon: Virgel Manifold, MD;  Location: [facility];  Service: Endoscopy;  Laterality: N/A;  . CORONARY ANGIOPLASTY    . CXR-copd, ts partial compression fracture  12/2006  . dexa  12/1996  . ESOPHAGOGASTRODUODENOSCOPY (EGD) WITH PROPOFOL N/A 03/15/2018   Procedure:  ESOPHAGOGASTRODUODENOSCOPY (EGD) WITH PROPOFOL;  Surgeon: Virgel Manifold, MD;  Location: [facility];  Service: Endoscopy;  Laterality: N/A;  . GANGLION CYST EXCISION Right 05/16/2019   Procedure: REMOVAL GANGLION OF WRIST;  Surgeon: Corky Mull, MD;  Location: [facility] ORS;  Service: Orthopedics;  Laterality: Right;  . hashimoto  02/1997  . hyerectomy- cervical ca cells    . LEFT HEART CATH AND CORONARY ANGIOGRAPHY Left 12/23/2017   Procedure: LEFT HEART CATH AND CORONARY ANGIOGRAPHY;  Surgeon: Minna Merritts, MD;  Location: [facility];  Service: Cardiovascular;  Laterality: Left;  . left wrist fracture     surgery  . right thyroid lobectomy, goiter, thyroiditis  12/2006  . stress fractures foot    . THYROIDECTOMY  11/2009   Social History   Tobacco Use  . Smoking status: Former Smoker    Packs/day: 0.50    Types: Cigarettes    Quit date: 08/07/2019    Years since quitting: 0.3  . Smokeless tobacco: Never Used  Substance Use Topics  . Alcohol use: No    Alcohol/week: 0.0 standard drinks  . Drug use: No   Family History  Problem Relation Age of Onset  . Stroke Father   . Hypertension Father   . Diabetes Mother   . Coronary artery disease Mother    Allergies  Allergen Reactions  . Fosamax [Alendronate Sodium]     Dysphagia and heartburn   . Raloxifene Hcl Other (See Comments)    Leg pain and cramps  Leg pain and cramps    Current Outpatient Medications on File Prior to Visit  Medication Sig Dispense Refill  . albuterol (PROVENTIL HFA;VENTOLIN HFA) 108 (90 BASE) MCG/ACT inhaler Inhale 2 puffs into the lungs every 4 (four) hours as needed for wheezing. 1 Inhaler 11  . atorvastatin (LIPITOR) 80 MG tablet Take 1 tablet (80 mg total) by mouth daily. 90 tablet 3  . Calcium Carbonate-Vit D-Min (CALCIUM 1200 PO) Take 1 tablet by mouth daily.     . cyclobenzaprine (FLEXERIL) 10 MG tablet Take 1 tablet (10 mg total) by mouth at bedtime. 30 tablet 1  . ezetimibe  (ZETIA) 10 MG tablet Take 1 tablet (10 mg total) by mouth daily. 90 tablet 3  . lactulose (CHRONULAC) 10 GM/15ML solution Take 15-30 mL by mouth once daily for 3 days or until you have a bowel movement 100 mL 0  . levothyroxine (SYNTHROID) 25 MCG tablet Take 1 tablet (25 mcg total) by mouth daily before breakfast. 30 tablet 1  . lisinopril (PRINIVIL,ZESTRIL) 10 MG tablet Take 1 tablet (10 mg total) by mouth daily. 90 tablet 3  . loratadine (CLARITIN) 10 MG tablet Take 1 tablet (10 mg total) by mouth daily.    Marland Kitchen LORazepam (ATIVAN) 1 MG tablet Take 1 tablet (1 mg total) by mouth at bedtime as needed for sleep. 90 tablet 0  . nortriptyline (PAMELOR) 25 MG  capsule Take 1 capsule (25 mg total) by mouth at bedtime. Along with the 75 mg capsule 90 capsule 0  . nortriptyline (PAMELOR) 75 MG capsule Take 1 capsule (75 mg total) by mouth at bedtime. 90 capsule 0  . triamcinolone (KENALOG) 0.025 % cream Apply 1 application topically 2 (two) times daily. To affected itchy areas (Patient taking differently: Apply 1 application topically 2 (two) times daily as needed (itchy skin). To affected itchy areas) 15 g 1   No current facility-administered medications on file prior to visit.    Review of Systems  Constitutional: Negative for activity change, appetite change, fatigue, fever and unexpected weight change.  HENT: Negative for congestion, ear pain, rhinorrhea, sinus pressure and sore throat.   Eyes: Negative for pain, redness and visual disturbance.  Respiratory: Negative for cough, shortness of breath and wheezing.   Cardiovascular: Negative for chest pain and palpitations.  Gastrointestinal: Negative for abdominal pain, blood in stool, constipation and diarrhea.  Endocrine: Negative for polydipsia and polyuria.  Genitourinary: Negative for dysuria, frequency and urgency.  Musculoskeletal: Positive for neck pain. Negative for arthralgias, back pain, myalgias and neck stiffness.  Skin: Negative for pallor  and rash.  Allergic/Immunologic: Negative for environmental allergies.  Neurological: Negative for dizziness, syncope and headaches.  Hematological: Negative for adenopathy. Does not bruise/bleed easily.  Psychiatric/Behavioral: Negative for decreased concentration and dysphoric mood. The patient is not nervous/anxious.        Objective:   Physical Exam Constitutional:      General: She is not in acute distress.    Appearance: Normal appearance. She is well-developed and normal weight. She is not ill-appearing.  HENT:     Head: Normocephalic and atraumatic.  Eyes:     General: No scleral icterus.    Conjunctiva/sclera: Conjunctivae normal.     Pupils: Pupils are equal, round, and reactive to light.  Neck:     Vascular: No carotid bruit.     Comments: Scar from prev thyroid surgery noted  Tender over high CS and R cervical musculature  Cardiovascular:     Rate and Rhythm: Normal rate and regular rhythm.  Pulmonary:     Effort: Pulmonary effort is normal.     Breath sounds: Normal breath sounds. No wheezing or rales.  Abdominal:     General: Bowel sounds are normal. There is no distension.     Palpations: Abdomen is soft.     Tenderness: There is no abdominal tenderness.  Musculoskeletal:        General: Tenderness present.     Cervical back: Normal range of motion and neck supple. Spasms, tenderness and crepitus present. No deformity, erythema, signs of trauma or rigidity. Pain with movement present. Normal range of motion.     Lumbar back: No edema, spasms, tenderness or bony tenderness. Normal range of motion.     Comments: Tender over upper cs and R cervical musculature (SCM especially and slt trapezius)  Pain to extend fully and rotate/tilt to R  Mild crepitus   Lymphadenopathy:     Cervical: No cervical adenopathy.  Skin:    General: Skin is warm and dry.     Coloration: Skin is not pale.     Findings: No erythema or rash.  Neurological:     Mental Status: She is  alert.     Cranial Nerves: No cranial nerve deficit.     Sensory: No sensory deficit.     Motor: No atrophy or abnormal muscle tone.  Coordination: Coordination normal.     Deep Tendon Reflexes: Reflexes are normal and symmetric.     Comments: Negative SLR  Psychiatric:        Mood and Affect: Mood normal.           Assessment & Plan:   Problem List Items Addressed This Visit      Cardiovascular and Mediastinum   Essential hypertension    bp in fair control at this time  BP Readings from Last 1 Encounters:  11/23/19 (!) 141/88  improved on 2nd check and may be slt elevated due to pain (neck) No changes needed Most recent labs reviewed  Disc lifstyle change with low sodium diet and exercise          Other   Neck pain - Primary    Primarily R sided but also on R  No neuro s/s Did not respond much to flexeril CS films today -pend rad review inst to try ice/heat Also more supportive pillow  Acetaminophen prn      Relevant Orders   DG Cervical Spine Complete (Completed)

## 2019-11-25 NOTE — Assessment & Plan Note (Signed)
Primarily R sided but also on R  No neuro s/s Did not respond much to flexeril CS films today -pend rad review inst to try ice/heat Also more supportive pillow  Acetaminophen prn

## 2019-11-25 NOTE — Assessment & Plan Note (Signed)
bp in fair control at this time  BP Readings from Last 1 Encounters:  11/23/19 (!) 141/88  improved on 2nd check and may be slt elevated due to pain (neck) No changes needed Most recent labs reviewed  Disc lifstyle change with low sodium diet and exercise

## 2019-11-27 ENCOUNTER — Telehealth: Payer: Self-pay | Admitting: Family Medicine

## 2019-11-27 DIAGNOSIS — M542 Cervicalgia: Secondary | ICD-10-CM

## 2019-11-27 NOTE — Telephone Encounter (Signed)
-----   Message from [place] sent at 11/27/2019 12:34 PM EDT ----- Results reviewed with patient, expressed understanding.   Pt states that she is having some numbness/tingling.  Pt is aware that we will place the referral - someone will be touch.

## 2019-11-27 NOTE — Telephone Encounter (Signed)
Ref done Will send to [facility] 

## 2019-11-28 DIAGNOSIS — M542 Cervicalgia: Secondary | ICD-10-CM | POA: Diagnosis not present

## 2019-11-30 DIAGNOSIS — M542 Cervicalgia: Secondary | ICD-10-CM | POA: Diagnosis not present

## 2019-12-05 DIAGNOSIS — M542 Cervicalgia: Secondary | ICD-10-CM | POA: Diagnosis not present

## 2019-12-07 ENCOUNTER — Other Ambulatory Visit: Payer: Medicare HMO

## 2019-12-07 ENCOUNTER — Telehealth: Payer: Self-pay | Admitting: Family Medicine

## 2019-12-07 DIAGNOSIS — E890 Postprocedural hypothyroidism: Secondary | ICD-10-CM

## 2019-12-07 NOTE — Telephone Encounter (Signed)
-----   Message from Ell[place] Sia sent at 11/29/2019 11:45 AM EDT ----- Regarding: Lab or[facility]res for Thursday, 4.8.21 Lab orders

## 2019-12-11 DIAGNOSIS — M542 Cervicalgia: Secondary | ICD-10-CM | POA: Diagnosis not present

## 2019-12-12 ENCOUNTER — Ambulatory Visit (INDEPENDENT_AMBULATORY_CARE_PROVIDER_SITE_OTHER): Payer: Medicare HMO

## 2019-12-12 ENCOUNTER — Other Ambulatory Visit: Payer: Self-pay

## 2019-12-12 DIAGNOSIS — Z0000 Encounter for general adult medical examination without abnormal findings: Secondary | ICD-10-CM

## 2019-12-12 NOTE — Progress Notes (Addendum)
PCP notes:  Health Maintenance: Mammogram- declined   Abnormal Screenings: none   Patient concerns: none   Nurse concerns: none   Next PCP appt: 12/19/2019 @ 11:30 am   I reviewed health advisor's note, was available for consultation, and agree with documentation and plan. Loura Pardon MD

## 2019-12-12 NOTE — Progress Notes (Signed)
Subjective:   [name] [name] is a 74 y.o. female who presents for Medicare Annual (Subsequent) preventive examination.  Review of Systems: N/A    This visit is being conducted through telemedicine via telephone at the nurse health advisor's home address due to the COVID-19 pandemic. This patient has given me verbal consent via doximity to conduct this visit, patient states they are participating from their home address. Patient and myself are on the telephone call. There is no referral for this visit. Some vital signs may be absent or patient reported.    Patient identification: identified by name, DOB, and current address   Cardiac Risk Factors include: advanced age (>16men, >18 women);hypertension;Other (see comment), Risk factor comments: hypercholesterolemia     Objective:     Vitals: There were no vitals taken for this visit.  There is no height or weight on file to calculate BMI.  Advanced Directives 12/12/2019 05/16/2019 05/11/2019 12/27/2018 12/26/2018 04/05/2018 03/15/2018  Does Patient Have a Medical Advance Directive? Yes No No No No No No  Type of Paramedic of [place];Living will - - - - - -  Copy of [place] in Chart? No - copy requested - - - - - -  Would patient like information on creating a medical advance directive? - No - Patient declined No - Patient declined No - Patient declined No - Patient declined No - Patient declined -    Tobacco Social History   Tobacco Use  Smoking Status Former Smoker  . Packs/day: 0.50  . Types: Cigarettes  . Quit date: 08/07/2019  . Years since quitting: 0.3  Smokeless Tobacco Never Used     Counseling given: Not Answered   Clinical Intake:  Pre-visit preparation completed: Yes  Pain : 0-10 Pain Score: 7  Pain Type: Chronic pain Pain Location: Neck Pain Descriptors / Indicators: Aching Pain Onset: More than a month ago Pain Frequency: Intermittent     Nutritional Risks:  None Diabetes: No  How often do you need to have someone help you when you read instructions, pamphlets, or other written materials from your doctor or pharmacy?: 1 - Never What is the last grade level you completed in school?: 10th  Interpreter Needed?: No  Information entered by :: CJohnson, LPN  Past Medical History:  Diagnosis Date  . COPD (chronic obstructive pulmonary disease) ([place])   . Depression   . Hyperlipidemia   . Hypertension   . Hypothyroidism   . Osteoarthritis   . Osteoporosis   . Shingles    arm 1/12  . Sleep disorder   . Tobacco abuse    past; quit 09   Past Surgical History:  Procedure Laterality Date  . ABDOMINAL HYSTERECTOMY    . BIOPSY THYROID     pos neoplasm, surgery 09/2006  . bunions  06/1998  . COLONOSCOPY WITH PROPOFOL N/A 03/15/2018   Procedure: COLONOSCOPY WITH PROPOFOL;  Surgeon: Virgel Manifold, MD;  Location: [facility];  Service: Endoscopy;  Laterality: N/A;  . CORONARY ANGIOPLASTY    . CXR-copd, ts partial compression fracture  12/2006  . dexa  12/1996  . ESOPHAGOGASTRODUODENOSCOPY (EGD) WITH PROPOFOL N/A 03/15/2018   Procedure: ESOPHAGOGASTRODUODENOSCOPY (EGD) WITH PROPOFOL;  Surgeon: Virgel Manifold, MD;  Location: [facility];  Service: Endoscopy;  Laterality: N/A;  . GANGLION CYST EXCISION Right 05/16/2019   Procedure: REMOVAL GANGLION OF WRIST;  Surgeon: Corky Mull, MD;  Location: [facility];  Service: Orthopedics;  Laterality: Right;  .  hashimoto  02/1997  . hyerectomy- cervical ca cells    . LEFT HEART CATH AND CORONARY ANGIOGRAPHY Left 12/23/2017   Procedure: LEFT HEART CATH AND CORONARY ANGIOGRAPHY;  Surgeon: Minna Merritts, MD;  Location: [facility];  Service: Cardiovascular;  Laterality: Left;  . left wrist fracture     surgery  . right thyroid lobectomy, goiter, thyroiditis  12/2006  . stress fractures foot    . THYROIDECTOMY  11/2009   Family History  Problem Relation Age of Onset  . Stroke Father    . Hypertension Father   . Diabetes Mother   . Coronary artery disease Mother    Social History   Socioeconomic History  . Marital status: Widowed    Spouse name: Not on file  . Number of children: Not on file  . Years of education: Not on file  . Highest education level: Not on file  Occupational History  . Not on file  Tobacco Use  . Smoking status: Former Smoker    Packs/day: 0.50    Types: Cigarettes    Quit date: 08/07/2019    Years since quitting: 0.3  . Smokeless tobacco: Never Used  Substance and Sexual Activity  . Alcohol use: No    Alcohol/week: 0.0 standard drinks  . Drug use: No  . Sexual activity: Not on file  Other Topics Concern  . Not on file  Social History Narrative   Marital Status: widowed.    3 children   Lost husband to throat cancer in 8/16 .    Social Determinants of Health   Financial Resource Strain: Low Risk   . Difficulty of Paying Living Expenses: Not hard at all  Food Insecurity: No Food Insecurity  . Worried About Charity fundraiser in the Last Year: Never true  . Ran Out of Food in the Last Year: Never true  Transportation Needs: No Transportation Needs  . Lack of Transportation (Medical): No  . Lack of Transportation (Non-Medical): No  Physical Activity: Inactive  . Days of Exercise per Week: 0 days  . Minutes of Exercise per Session: 0 min  Stress: No Stress Concern Present  . Feeling of Stress : Not at all  Social Connections:   . Frequency of Communication with Friends and Family:   . Frequency of Social Gatherings with Friends and Family:   . Attends Religious Services:   . Active Member of Clubs or Organizations:   . Attends Archivist Meetings:   Marland Kitchen Marital Status:     Outpatient Encounter Medications as of 12/12/2019  Medication Sig  . albuterol (PROVENTIL HFA;VENTOLIN HFA) 108 (90 BASE) MCG/ACT inhaler Inhale 2 puffs into the lungs every 4 (four) hours as needed for wheezing.  Marland Kitchen atorvastatin (LIPITOR) 80 MG  tablet Take 1 tablet (80 mg total) by mouth daily.  . Calcium Carbonate-Vit D-Min (CALCIUM 1200 PO) Take 1 tablet by mouth daily.   . cyclobenzaprine (FLEXERIL) 10 MG tablet Take 1 tablet (10 mg total) by mouth at bedtime.  Marland Kitchen lactulose (CHRONULAC) 10 GM/15ML solution Take 15-30 mL by mouth once daily for 3 days or until you have a bowel movement  . levothyroxine (SYNTHROID) 25 MCG tablet Take 1 tablet (25 mcg total) by mouth daily before breakfast.  . lisinopril (PRINIVIL,ZESTRIL) 10 MG tablet Take 1 tablet (10 mg total) by mouth daily.  Marland Kitchen loratadine (CLARITIN) 10 MG tablet Take 1 tablet (10 mg total) by mouth daily.  Marland Kitchen LORazepam (ATIVAN) 1 MG tablet  Take 1 tablet (1 mg total) by mouth at bedtime as needed for sleep.  . nortriptyline (PAMELOR) 25 MG capsule Take 1 capsule (25 mg total) by mouth at bedtime. Along with the 75 mg capsule  . nortriptyline (PAMELOR) 75 MG capsule Take 1 capsule (75 mg total) by mouth at bedtime.  . triamcinolone (KENALOG) 0.025 % cream Apply 1 application topically 2 (two) times daily. To affected itchy areas (Patient taking differently: Apply 1 application topically 2 (two) times daily as needed (itchy skin). To affected itchy areas)  . ezetimibe (ZETIA) 10 MG tablet Take 1 tablet (10 mg total) by mouth daily.   No facility-administered encounter medications on file as of 12/12/2019.    Activities of Daily Living In your present state of health, do you have any difficulty performing the following activities: 12/12/2019 05/11/2019  Hearing? N N  Vision? N N  Difficulty concentrating or making decisions? N N  Walking or climbing stairs? N N  Dressing or bathing? N N  Doing errands, shopping? N N  Preparing Food and eating ? N -  Using the Toilet? N -  In the past six months, have you accidently leaked urine? N -  Do you have problems with loss of bowel control? N -  Managing your Medications? N -  Managing your Finances? N -  Housekeeping or managing your  Housekeeping? N -  Some recent data might be hidden    Patient Care Team: Tower, Wynelle Fanny, MD as PCP - General Kerin Ransom Stephannie Li, OD as Consulting Physician (Optometry)    Assessment:   This is a routine wellness examination for [name] [name].  Exercise Activities and Dietary recommendations Current Exercise Habits: The patient does not participate in regular exercise at present, Exercise limited by: None identified  Goals    . Increase physical activity     Starting 07/02/2016, I will continue to walk at least 30 min daily.     . Increase physical activity     Starting 09/22/2017, I will continue to do stretching and core exercises for 5 minutes daily and to resume walking when weather permits.     . Patient Stated     12/12/2019, I will maintain and continue medications as prescribed.        Fall Risk Fall Risk  12/12/2019 10/16/2018 09/22/2017 07/02/2016 01/02/2014  Falls in the past year? 0 1 Yes No No  Comment - - fall with fracture of left knee - -  Number falls in past yr: 0 1 1 - -  Injury with Fall? 0 0 Yes - -  Risk for fall due to : Medication side effect - - - -  Follow up Falls evaluation completed;Falls prevention discussed - - - -   Is the patient's home free of loose throw rugs in walkways, pet beds, electrical cords, etc?   yes      Grab bars in the bathroom? yes      Handrails on the stairs?   yes      Adequate lighting?   yes  Timed Get Up and Go performed: Yes  Depression Screen PHQ 2/9 Scores 12/12/2019 11/05/2019 10/16/2018 09/22/2017  PHQ - 2 Score 1 6 0 0  PHQ- 9 Score 1 13 - 0     Cognitive Function MMSE - Mini Mental State Exam 12/12/2019 09/22/2017 07/02/2016  Orientation to time 5 5 5   Orientation to Place 5 5 5   Registration 3 3 3   Attention/ Calculation 5 0 0  Recall 3 3 3   Language- name 2 objects - 0 0  Language- repeat 1 1 1   Language- follow 3 step command - 2 0  Language- follow 3 step command-comments - unable to follow 1 step of 3 step command pt  was unable to follow 3 steps of 3 step command  Language- read & follow direction - 0 0  Write a sentence - 0 0  Copy design - 0 0  Total score - 19 17  Mini Cog  Mini-Cog screen was completed. Maximum score is 22. A value of 0 denotes this part of the MMSE was not completed or the patient failed this part of the Mini-Cog screening.       Immunization History  Administered Date(s) Administered  . Influenza Split 05/24/2012  . Influenza Whole 06/16/2007  . Influenza,inj,Quad PF,6+ Mos 07/04/2013, 06/28/2014, 07/25/2015, 06/04/2016, 08/31/2017, 10/16/2018  . Pneumococcal Conjugate-13 08/08/2015  . Pneumococcal Polysaccharide-23 11/10/2007, 06/28/2014  . Td 11/10/2007    Qualifies for Shingles Vaccine: Yes  Screening Tests Health Maintenance  Topic Date Due  . MAMMOGRAM  05/24/2021 (Originally 09/12/1995)  . TETANUS/TDAP  10/16/2028 (Originally 11/09/2017)  . INFLUENZA VACCINE  04/06/2020  . COLONOSCOPY  03/15/2028  . DEXA SCAN  Completed  . Hepatitis C Screening  Completed  . PNA vac Low Risk Adult  Completed    Cancer Screenings: Lung: Low Dose CT Chest recommended if Age 71-80 years, 30 pack-year currently smoking OR have quit w/in 15 years. Patient does not qualify. Breast:  Up to date on Mammogram: No, declined   Bone Density: completed 10/20/2017 Colorectal: completed 03/15/2018  Additional Screenings:  Hepatitis C Screening: 07/02/2016     Plan:   Patient will maintain and continue medications as prescribed.   I have personally reviewed and noted the following in the patient's chart:   . Medical and social history . Use of alcohol, tobacco or illicit drugs  . Current medications and supplements . Functional ability and status . Nutritional status . Physical activity . Advanced directives . List of other physicians . Hospitalizations, surgeries, and ER visits in previous 12 months . Vitals . Screenings to include cognitive, depression, and falls . Referrals  and appointments  In addition, I have reviewed and discussed with patient certain preventive protocols, quality metrics, and best practice recommendations. A written personalized care plan for preventive services as well as general preventive health recommendations were provided to patient.     Andrez Grime, LPN  [id number]

## 2019-12-12 NOTE — Patient Instructions (Signed)
Ms. Fultz , Thank you for taking time to come for your Medicare Wellness Visit. I appreciate your ongoing commitment to your health goals. Please review the following plan we discussed and let me know if I can assist you in the future.   Screening recommendations/referrals: Colonoscopy: Up to date, completed 03/15/2018 Mammogram: declined Bone Density: Up to date, completed 10/20/2017 Recommended yearly ophthalmology/optometry visit for glaucoma screening and checkup Recommended yearly dental visit for hygiene and checkup  Vaccinations: Influenza vaccine: Fall 2021 Pneumococcal vaccine: Completed series Tdap vaccine: decline Shingles vaccine: discussed    Advanced directives: Please bring a copy of your POA (Power of Attorney) and/or Living Will to your next appointment.   Conditions/risks identified: hypertension, hypercholesterolemia  Next appointment: 12/19/2019 @ 11:30 am    Preventive Care 65 Years and Older, Female Preventive care refers to lifestyle choices and visits with your health care provider that can promote health and wellness. What does preventive care include?  A yearly physical exam. This is also called an annual well check.  Dental exams once or twice a year.  Routine eye exams. Ask your health care provider how often you should have your eyes checked.  Personal lifestyle choices, including:  Daily care of your teeth and gums.  Regular physical activity.  Eating a healthy diet.  Avoiding tobacco and drug use.  Limiting alcohol use.  Practicing safe sex.  Taking low-dose aspirin every day.  Taking vitamin and mineral supplements as recommended by your health care provider. What happens during an annual well check? The services and screenings done by your health care provider during your annual well check will depend on your age, overall health, lifestyle risk factors, and family history of disease. Counseling  Your health care provider may ask you  questions about your:  Alcohol use.  Tobacco use.  Drug use.  Emotional well-being.  Home and relationship well-being.  Sexual activity.  Eating habits.  History of falls.  Memory and ability to understand (cognition).  Work and work Statistician.  Reproductive health. Screening  You may have the following tests or measurements:  Height, weight, and BMI.  Blood pressure.  Lipid and cholesterol levels. These may be checked every 5 years, or more frequently if you are over 75 years old.  Skin check.  Lung cancer screening. You may have this screening every year starting at age 65 if you have a 30-pack-year history of smoking and currently smoke or have quit within the past 15 years.  Fecal occult blood test (FOBT) of the stool. You may have this test every year starting at age 39.  Flexible sigmoidoscopy or colonoscopy. You may have a sigmoidoscopy every 5 years or a colonoscopy every 10 years starting at age 58.  Hepatitis C blood test.  Hepatitis B blood test.  Sexually transmitted disease (STD) testing.  Diabetes screening. This is done by checking your blood sugar (glucose) after you have not eaten for a while (fasting). You may have this done every 1-3 years.  Bone density scan. This is done to screen for osteoporosis. You may have this done starting at age 5.  Mammogram. This may be done every 1-2 years. Talk to your health care provider about how often you should have regular mammograms. Talk with your health care provider about your test results, treatment options, and if necessary, the need for more tests. Vaccines  Your health care provider may recommend certain vaccines, such as:  Influenza vaccine. This is recommended every year.  Tetanus, diphtheria,  and acellular pertussis (Tdap, Td) vaccine. You may need a Td booster every 10 years.  Zoster vaccine. You may need this after age 34.  Pneumococcal 13-valent conjugate (PCV13) vaccine. One dose is  recommended after age 61.  Pneumococcal polysaccharide (PPSV23) vaccine. One dose is recommended after age 37. Talk to your health care provider about which screenings and vaccines you need and how often you need them. This information is not intended to replace advice given to you by your health care provider. Make sure you discuss any questions you have with your health care provider. Document Released: 09/19/2015 Document Revised: 05/12/2016 Document Reviewed: 06/24/2015 Elsevier Interactive Patient Education  2017 [place] Prevention in the Home Falls can cause injuries. They can happen to people of all ages. There are many things you can do to make your home safe and to help prevent falls. What can I do on the outside of my home?  Regularly fix the edges of walkways and driveways and fix any cracks.  Remove anything that might make you trip as you walk through a door, such as a raised step or threshold.  Trim any bushes or trees on the path to your home.  Use bright outdoor lighting.  Clear any walking paths of anything that might make someone trip, such as rocks or tools.  Regularly check to see if handrails are loose or broken. Make sure that both sides of any steps have handrails.  Any raised decks and porches should have guardrails on the edges.  Have any leaves, snow, or ice cleared regularly.  Use sand or salt on walking paths during winter.  Clean up any spills in your garage right away. This includes oil or grease spills. What can I do in the bathroom?  Use night lights.  Install grab bars by the toilet and in the tub and shower. Do not use towel bars as grab bars.  Use non-skid mats or decals in the tub or shower.  If you need to sit down in the shower, use a plastic, non-slip stool.  Keep the floor dry. Clean up any water that spills on the floor as soon as it happens.  Remove soap buildup in the tub or shower regularly.  Attach bath mats  securely with double-sided non-slip rug tape.  Do not have throw rugs and other things on the floor that can make you trip. What can I do in the bedroom?  Use night lights.  Make sure that you have a light by your bed that is easy to reach.  Do not use any sheets or blankets that are too big for your bed. They should not hang down onto the floor.  Have a firm chair that has side arms. You can use this for support while you get dressed.  Do not have throw rugs and other things on the floor that can make you trip. What can I do in the kitchen?  Clean up any spills right away.  Avoid walking on wet floors.  Keep items that you use a lot in easy-to-reach places.  If you need to reach something above you, use a strong step stool that has a grab bar.  Keep electrical cords out of the way.  Do not use floor polish or wax that makes floors slippery. If you must use wax, use non-skid floor wax.  Do not have throw rugs and other things on the floor that can make you trip. What can I do with my  stairs?  Do not leave any items on the stairs.  Make sure that there are handrails on both sides of the stairs and use them. Fix handrails that are broken or loose. Make sure that handrails are as long as the stairways.  Check any carpeting to make sure that it is firmly attached to the stairs. Fix any carpet that is loose or worn.  Avoid having throw rugs at the top or bottom of the stairs. If you do have throw rugs, attach them to the floor with carpet tape.  Make sure that you have a light switch at the top of the stairs and the bottom of the stairs. If you do not have them, ask someone to add them for you. What else can I do to help prevent falls?  Wear shoes that:  Do not have high heels.  Have rubber bottoms.  Are comfortable and fit you well.  Are closed at the toe. Do not wear sandals.  If you use a stepladder:  Make sure that it is fully opened. Do not climb a closed  stepladder.  Make sure that both sides of the stepladder are locked into place.  Ask someone to hold it for you, if possible.  Clearly mark and make sure that you can see:  Any grab bars or handrails.  First and last steps.  Where the edge of each step is.  Use tools that help you move around (mobility aids) if they are needed. These include:  Canes.  Walkers.  Scooters.  Crutches.  Turn on the lights when you go into a dark area. Replace any light bulbs as soon as they burn out.  Set up your furniture so you have a clear path. Avoid moving your furniture around.  If any of your floors are uneven, fix them.  If there are any pets around you, be aware of where they are.  Review your medicines with your doctor. Some medicines can make you feel dizzy. This can increase your chance of falling. Ask your doctor what other things that you can do to help prevent falls. This information is not intended to replace advice given to you by your health care provider. Make sure you discuss any questions you have with your health care provider. Document Released: 06/19/2009 Document Revised: 01/29/2016 Document Reviewed: 09/27/2014 Elsevier Interactive Patient Education  2017 [employer].

## 2019-12-13 ENCOUNTER — Other Ambulatory Visit: Payer: Self-pay

## 2019-12-13 ENCOUNTER — Other Ambulatory Visit (INDEPENDENT_AMBULATORY_CARE_PROVIDER_SITE_OTHER): Payer: Medicare HMO

## 2019-12-13 DIAGNOSIS — E890 Postprocedural hypothyroidism: Secondary | ICD-10-CM | POA: Diagnosis not present

## 2019-12-13 LAB — TSH: TSH: 93.08 u[IU]/mL — ABNORMAL HIGH (ref 0.35–4.50)

## 2019-12-14 ENCOUNTER — Other Ambulatory Visit: Payer: Self-pay | Admitting: *Deleted

## 2019-12-14 DIAGNOSIS — M542 Cervicalgia: Secondary | ICD-10-CM | POA: Diagnosis not present

## 2019-12-14 NOTE — Telephone Encounter (Signed)
-----   Message from Abner Greenspan, MD sent at 12/13/2019  6:21 PM EDT ----- The 25 mcg of levothyroxine is not enough so I want to go up to 50 mcg  How is she feeling?  Any missed doses?  Please send in 50 mcg levothyroxine 1 po qd #30 11 ref (or 90 day supply if pref)  Re check TSH in 1 mo pleas e

## 2019-12-14 NOTE — Telephone Encounter (Signed)
Left VM requesting pt to call the office back 

## 2019-12-17 MED ORDER — LEVOTHYROXINE SODIUM 50 MCG PO TABS: 50.0000 ug | ORAL_TABLET | Freq: Every day | ORAL | 1 refills | Status: DC

## 2019-12-17 NOTE — Telephone Encounter (Signed)
Pt notified of lab results and Dr. Tower's comments Rx sent to pharmacy and lab appt scheduled  

## 2019-12-18 DIAGNOSIS — M542 Cervicalgia: Secondary | ICD-10-CM | POA: Diagnosis not present

## 2019-12-19 ENCOUNTER — Ambulatory Visit (INDEPENDENT_AMBULATORY_CARE_PROVIDER_SITE_OTHER): Payer: Medicare HMO | Admitting: Family Medicine

## 2019-12-19 ENCOUNTER — Other Ambulatory Visit: Payer: Self-pay

## 2019-12-19 ENCOUNTER — Encounter: Payer: Self-pay | Admitting: Family Medicine

## 2019-12-19 VITALS — BP 135/80 | HR 68 | Temp 97.6°F | Ht 63.0 in | Wt 112.1 lb

## 2019-12-19 DIAGNOSIS — M8000XS Age-related osteoporosis with current pathological fracture, unspecified site, sequela: Secondary | ICD-10-CM

## 2019-12-19 DIAGNOSIS — E7800 Pure hypercholesterolemia, unspecified: Secondary | ICD-10-CM | POA: Diagnosis not present

## 2019-12-19 DIAGNOSIS — I10 Essential (primary) hypertension: Secondary | ICD-10-CM

## 2019-12-19 DIAGNOSIS — E890 Postprocedural hypothyroidism: Secondary | ICD-10-CM | POA: Diagnosis not present

## 2019-12-19 DIAGNOSIS — Z0000 Encounter for general adult medical examination without abnormal findings: Secondary | ICD-10-CM | POA: Diagnosis not present

## 2019-12-19 DIAGNOSIS — M542 Cervicalgia: Secondary | ICD-10-CM

## 2019-12-19 MED ORDER — LISINOPRIL 10 MG PO TABS: 10.0000 mg | ORAL_TABLET | Freq: Every day | ORAL | 3 refills | Status: DC

## 2019-12-19 MED ORDER — EZETIMIBE 10 MG PO TABS: 10.0000 mg | ORAL_TABLET | Freq: Every day | ORAL | 3 refills | Status: DC

## 2019-12-19 MED ORDER — NORTRIPTYLINE HCL 75 MG PO CAPS: 75.0000 mg | ORAL_CAPSULE | Freq: Every day | ORAL | 3 refills | Status: DC

## 2019-12-19 MED ORDER — ATORVASTATIN CALCIUM 80 MG PO TABS: 80.0000 mg | ORAL_TABLET | Freq: Every day | ORAL | 3 refills | Status: DC

## 2019-12-19 MED ORDER — NORTRIPTYLINE HCL 25 MG PO CAPS: 25.0000 mg | ORAL_CAPSULE | Freq: Every day | ORAL | 3 refills | Status: DC

## 2019-12-19 NOTE — Assessment & Plan Note (Signed)
Hypothyroidism  Pt has no clinical changes No change in energy level/ hair or skin/ edema and no tremor TSH is way up after dropping dose for low TSH Better pulse and bp now  Lab Results  Component Value Date   TSH 93.08 (H) 12/13/2019    levothy dose dropped to 50 mcg daily  Re check in 6 wk

## 2019-12-19 NOTE — Assessment & Plan Note (Signed)
bp in fair control at this time  BP Readings from Last 1 Encounters:  12/19/19 135/80   No changes needed Most recent labs reviewed  Disc lifstyle change with low sodium diet and exercise

## 2019-12-19 NOTE — Assessment & Plan Note (Signed)
With h/o compression fx dexa 2/19  Fall this week-no fractures  Disc fall prev (change clothes sitting , walker for support) Fair exercise  Good D intake

## 2019-12-19 NOTE — Progress Notes (Signed)
Subjective:    Patient ID: [name] [name], female    DOB: 03-Dec-1945, 74 y.o.   MRN: [id number]  This visit occurred during the SARS-CoV-2 public health emergency.  Safety protocols were in place, including screening questions prior to the visit, additional usage of staff PPE, and extensive cleaning of exam room while observing appropriate contact time as indicated for disinfecting solutions.    HPI  Here for health maintenance exam and to review chronic medical problems    Wt Readings from Last 3 Encounters:  12/19/19 112 lb 1 oz (50.8 kg)  11/23/19 113 lb 8 oz (51.5 kg)  11/05/19 112 lb (50.8 kg)  weight is stable  Good appetite  19.85 kg/m   amw was 4/7 Noted she declines mammograms  Self breast exam - no lumps   Tetanus shot postponed for financial reasons   colonoscopy 7/19   Up to date  dexa 2/19-osteoporosis Falls -last fall yesterday/ fell getting dressed and lost balance and skinned nose on carpet  She may have to start using walker  Fractures-multiple in the past  Supplements -vit D Taking prolia -tolerates well  Exercise - works in the yard and walks   bp is stable today  No cp or palpitations or headaches or edema  No side effects to medicines  BP Readings from Last 3 Encounters:  12/19/19 (!) 158/86  11/23/19 (!) 141/88  11/05/19 126/74    Takes lisinopril  BP: 135/80  Improved on 2nd check   Lab Results  Component Value Date   CREATININE 0.63 05/11/2019   BUN 13 05/11/2019   NA 139 05/11/2019   K 4.6 05/11/2019   CL 105 05/11/2019   CO2 26 05/11/2019   Lab Results  Component Value Date   ALT 12 02/06/2019   AST 19 02/06/2019   ALKPHOS 48 02/06/2019   BILITOT 0.4 02/06/2019     Pulse Readings from Last 3 Encounters:  12/19/19 68  11/23/19 64  11/05/19 80     Hypothyroidism /hx of thyroidectomy Lab Results  Component Value Date   TSH 93.08 (H) 12/13/2019   this was up significantly after reducing dose Went up to 50 mcg today   Feels ok   Hyperlipidemia Lab Results  Component Value Date   CHOL 198 02/06/2019   HDL 71.60 02/06/2019   LDLCALC 112 (H) 02/06/2019   LDLDIRECT 140.9 07/04/2013   TRIG 73.0 02/06/2019   CHOLHDL 3 02/06/2019  atorvastatin and zetia  Diet is good -limits junk food   Still a non smoker !    Patient Active Problem List   Diagnosis Date Noted  . Neck pain 11/05/2019  . COVID-19 virus infection 12/25/2018  . Medicare annual wellness visit, subsequent 10/16/2018  . Skin lesion of face 10/16/2018  . Benign neoplasm of ascending colon   . Benign neoplasm of transverse colon   . Melanosis, colon   . Other specified diseases of esophagus   . Stomach irritation   . Constipation 01/24/2018  . Compression fracture of L1 vertebra ([facility]) 01/24/2018  . Anxiety 01/09/2018  . Coronary artery calcification seen on CAT scan 11/25/2017  . Special screening for malignant neoplasms, colon 11/01/2017  . Estrogen deficiency 09/26/2017  . Aortic calcification ([place]) 11/30/2016  . History of patellar fracture 11/30/2016  . Routine general medical examination at a health care facility 06/27/2016  . Dyspepsia 06/04/2016  . Loss of weight 06/04/2016  . History of vertebral compression fracture 01/19/2016  . Esophageal dysphagia 06/28/2014  .  Chronic foot pain 10/27/2011  . Low back pain 04/14/2011  . FEVER BLISTER 11/17/2010  . Essential hypertension 08/18/2009  . BUNION 03/04/2009  . Allergic rhinitis 01/10/2008  . H/O goiter 02/09/2007  . Hypothyroidism 02/09/2007  . HYPERCHOLESTEROLEMIA 02/09/2007  . History of depression 02/09/2007  . OSTEOARTHRITIS 02/09/2007  . Osteoporosis 02/09/2007  . Disturbance in sleep behavior 02/09/2007   Past Medical History:  Diagnosis Date  . COPD (chronic obstructive pulmonary disease) ([place])   . Depression   . Hyperlipidemia   . Hypertension   . Hypothyroidism   . Osteoarthritis   . Osteoporosis   . Shingles    arm 1/12  . Sleep disorder   .  Tobacco abuse    past; quit 09   Past Surgical History:  Procedure Laterality Date  . ABDOMINAL HYSTERECTOMY    . BIOPSY THYROID     pos neoplasm, surgery 09/2006  . bunions  06/1998  . COLONOSCOPY WITH PROPOFOL N/A 03/15/2018   Procedure: COLONOSCOPY WITH PROPOFOL;  Surgeon: Virgel Manifold, MD;  Location: [facility] END[facility];  Service: Endoscopy;  Laterality: N/A;  . CORONARY ANGIOPLASTY    . CXR-copd, ts partial compression fracture  12/2006  . dexa  12/1996  . ESOPHAGOGASTRODUODENOSCOPY (EGD) WITH PROPOFOL N/A 03/15/2018   Procedure: ESOPHAGOGASTRODUODENOSCOPY (EGD) WITH PROPOFOL;  Surgeon: Virgel Manifold, MD;  Location: [facility] END[facility]COPY;  Service: Endoscopy;  Laterality: N/A;  . GANGLION CYST EXCISION Right 05/16/2019   Procedure: REMOVAL GANGLION OF WRIST;  Surgeon: Corky Mull, MD;  Location: [facility] ORS;  Service: Orthopedics;  Laterality: Right;  . hashimoto  02/1997  . hyerectomy- cervical ca cells    . LEFT HEART CATH AND CORONARY ANGIOGRAPHY Left 12/23/2017   Procedure: LEFT HEART CATH AND CORONARY ANGIOGRAPHY;  Surgeon: Minna Merritts, MD;  Location: [facility];  Service: Cardiovascular;  Laterality: Left;  . left wrist fracture     surgery  . right thyroid lobectomy, goiter, thyroiditis  12/2006  . stress fractures foot    . THYROIDECTOMY  11/2009   Social History   Tobacco Use  . Smoking status: Former Smoker    Packs/day: 0.50    Types: Cigarettes    Quit date: 08/07/2019    Years since quitting: 0.3  . Smokeless tobacco: Never Used  Substance Use Topics  . Alcohol use: No    Alcohol/week: 0.0 standard drinks  . Drug use: No   Family History  Problem Relation Age of Onset  . Stroke Father   . Hypertension Father   . Diabetes Mother   . Coronary artery disease Mother    Allergies  Allergen Reactions  . Fosamax [Alendronate Sodium]     Dysphagia and heartburn   . Raloxifene Hcl Other (See Comments)    Leg pain and cramps  Leg pain and cramps     Current Outpatient Medications on File Prior to Visit  Medication Sig Dispense Refill  . albuterol (PROVENTIL HFA;VENTOLIN HFA) 108 (90 BASE) MCG/ACT inhaler Inhale 2 puffs into the lungs every 4 (four) hours as needed for wheezing. 1 Inhaler 11  . Calcium Carbonate-Vit D-Min (CALCIUM 1200 PO) Take 1 tablet by mouth daily.     . cyclobenzaprine (FLEXERIL) 10 MG tablet Take 1 tablet (10 mg total) by mouth at bedtime. 30 tablet 1  . lactulose (CHRONULAC) 10 GM/15ML solution Take 15-30 mL by mouth once daily for 3 days or until you have a bowel movement 100 mL 0  . levothyroxine (SYNTHROID) 50 MCG  tablet Take 1 tablet (50 mcg total) by mouth daily before breakfast. 30 tablet 1  . loratadine (CLARITIN) 10 MG tablet Take 1 tablet (10 mg total) by mouth daily.    Marland Kitchen LORazepam (ATIVAN) 1 MG tablet Take 1 tablet (1 mg total) by mouth at bedtime as needed for sleep. 90 tablet 0  . triamcinolone (KENALOG) 0.025 % cream Apply 1 application topically 2 (two) times daily. To affected itchy areas (Patient taking differently: Apply 1 application topically 2 (two) times daily as needed (itchy skin). To affected itchy areas) 15 g 1   No current facility-administered medications on file prior to visit.    Review of Systems  Constitutional: Negative for activity change, appetite change, fatigue, fever and unexpected weight change.  HENT: Negative for congestion, ear pain, rhinorrhea, sinus pressure and sore throat.   Eyes: Negative for pain, redness and visual disturbance.  Respiratory: Negative for cough, shortness of breath and wheezing.   Cardiovascular: Negative for chest pain and palpitations.  Gastrointestinal: Negative for abdominal pain, blood in stool, constipation and diarrhea.  Endocrine: Negative for polydipsia and polyuria.  Genitourinary: Negative for dysuria, frequency and urgency.  Musculoskeletal: Positive for back pain and neck pain. Negative for arthralgias and myalgias.  Skin: Positive for  wound. Negative for pallor and rash.       Abrasion on nose from recent fall  Allergic/Immunologic: Negative for environmental allergies.  Neurological: Negative for dizziness, syncope and headaches.  Hematological: Negative for adenopathy. Does not bruise/bleed easily.  Psychiatric/Behavioral: Positive for sleep disturbance. Negative for decreased concentration and dysphoric mood. The patient is not nervous/anxious.        Objective:   Physical Exam Constitutional:      General: She is not in acute distress.    Appearance: Normal appearance. She is well-developed. She is not ill-appearing or diaphoretic.     Comments: Underweight- baseline  Well appearing  HENT:     Head: Normocephalic and atraumatic.     Right Ear: Tympanic membrane, ear canal and external ear normal.     Left Ear: Tympanic membrane, ear canal and external ear normal.     Nose: Nose normal. No congestion.     Mouth/Throat:     Mouth: Mucous membranes are moist.     Pharynx: Oropharynx is clear. No posterior oropharyngeal erythema.  Eyes:     General: No scleral icterus.    Extraocular Movements: Extraocular movements intact.     Conjunctiva/sclera: Conjunctivae normal.     Pupils: Pupils are equal, round, and reactive to light.  Neck:     Thyroid: No thyromegaly.     Vascular: No carotid bruit or JVD.     Comments: Thyroidectomy scar Cardiovascular:     Rate and Rhythm: Normal rate and regular rhythm.     Pulses: Normal pulses.     Heart sounds: Normal heart sounds. No gallop.   Pulmonary:     Effort: Pulmonary effort is normal. No respiratory distress.     Breath sounds: Normal breath sounds. No wheezing or rales.     Comments: Diffusely distant bs  Chest:     Chest wall: No tenderness.  Abdominal:     General: Bowel sounds are normal. There is no distension or abdominal bruit.     Palpations: Abdomen is soft. There is no mass.     Tenderness: There is no abdominal tenderness.     Hernia: No hernia  is present.  Genitourinary:    Comments: Breast exam: No mass,  nodules, thickening, tenderness, bulging, retraction, inflamation, nipple discharge or skin changes noted.  No axillary or clavicular LA.     Musculoskeletal:        General: No tenderness. Normal range of motion.     Cervical back: Normal range of motion and neck supple. No rigidity. No muscular tenderness.     Right lower leg: No edema.     Left lower leg: No edema.     Comments: Kyphosis   Lymphadenopathy:     Cervical: No cervical adenopathy.  Skin:    General: Skin is warm and dry.     Coloration: Skin is not pale.     Findings: No erythema or rash.     Comments: Solar lentigines diffusely Some sks  Neurological:     Mental Status: She is alert. Mental status is at baseline.     Cranial Nerves: No cranial nerve deficit.     Motor: No abnormal muscle tone.     Coordination: Coordination normal.     Gait: Gait normal.     Deep Tendon Reflexes: Reflexes are normal and symmetric. Reflexes normal.  Psychiatric:        Mood and Affect: Mood normal.        Cognition and Memory: Cognition and memory normal.           Assessment & Plan:   Problem List Items Addressed This Visit      Cardiovascular and Mediastinum   Essential hypertension    bp in fair control at this time  BP Readings from Last 1 Encounters:  12/19/19 135/80   No changes needed Most recent labs reviewed  Disc lifstyle change with low sodium diet and exercise        Relevant Medications   atorvastatin (LIPITOR) 80 MG tablet   ezetimibe (ZETIA) 10 MG tablet   lisinopril (ZESTRIL) 10 MG tablet     Endocrine   Hypothyroidism    Hypothyroidism  Pt has no clinical changes No change in energy level/ hair or skin/ edema and no tremor TSH is way up after dropping dose for low TSH Better pulse and bp now  Lab Results  Component Value Date   TSH 93.08 (H) 12/13/2019    levothy dose dropped to 50 mcg daily  Re check in 6 wk         Musculoskeletal and Integument   Osteoporosis    With h/o compression fx dexa 2/19  Fall this week-no fractures  Disc fall prev (change clothes sitting , walker for support) Fair exercise  Good D intake           Other   HYPERCHOLESTEROLEMIA    Disc goals for lipids and reasons to control them Rev last labs with pt Rev low sat fat diet in detail Fair control with max dose statin and zetia  Good diet        Relevant Medications   atorvastatin (LIPITOR) 80 MG tablet   ezetimibe (ZETIA) 10 MG tablet   lisinopril (ZESTRIL) 10 MG tablet   Routine general medical examination at a health care facility - Primary    Reviewed health habits including diet and exercise and skin cancer prevention Reviewed appropriate screening tests for age  Also reviewed health mt list, fam hx and immunization status , as well as social and family history   See HPI Labs reviewed  Declines mammogram/nl exam  No longer smoking  Rev dexa  Disc fall prevention in detail  Neck pain    Improving In PT

## 2019-12-19 NOTE — Assessment & Plan Note (Signed)
Improving In PT

## 2019-12-19 NOTE — Assessment & Plan Note (Signed)
Reviewed health habits including diet and exercise and skin cancer prevention Reviewed appropriate screening tests for age  Also reviewed health mt list, fam hx and immunization status , as well as social and family history   See HPI Labs reviewed  Declines mammogram/nl exam  No longer smoking  Rev dexa  Disc fall prevention in detail

## 2019-12-19 NOTE — Assessment & Plan Note (Signed)
Disc goals for lipids and reasons to control them Rev last labs with pt Rev low sat fat diet in detail Fair control with max dose statin and zetia  Good diet

## 2019-12-19 NOTE — Patient Instructions (Signed)
Look into getting your covid shots  COVID-19 Vaccine Information can be found at: ShippingScam.co.uk For questions related to vaccine distribution or appointments, please email vaccine@[email or web address] or call [phone].    Take care of yourself Stay on the 50 mcg thyroid medicine  Eat regular meals Use walker to prevent falls if needed  Always get dressed sitting down   Keep the abrasion on your nose clean with soap and water  Antibiotic ointment is ok  Alert me if it does not heal or becomes more red or painful

## 2019-12-20 DIAGNOSIS — M542 Cervicalgia: Secondary | ICD-10-CM | POA: Diagnosis not present

## 2019-12-28 ENCOUNTER — Other Ambulatory Visit: Payer: Self-pay | Admitting: *Deleted

## 2019-12-28 MED ORDER — LISINOPRIL 10 MG PO TABS: 10.0000 mg | ORAL_TABLET | Freq: Every day | ORAL | 3 refills | Status: DC

## 2019-12-28 MED ORDER — ATORVASTATIN CALCIUM 80 MG PO TABS: 80.0000 mg | ORAL_TABLET | Freq: Every day | ORAL | 3 refills | Status: DC

## 2020-01-01 ENCOUNTER — Other Ambulatory Visit: Payer: Self-pay | Admitting: Family Medicine

## 2020-01-01 ENCOUNTER — Other Ambulatory Visit: Payer: Self-pay | Admitting: General Practice

## 2020-01-01 MED ORDER — LORAZEPAM 1 MG PO TABS: 1.0000 mg | ORAL_TABLET | Freq: Every evening | ORAL | 0 refills | Status: DC | PRN

## 2020-01-01 NOTE — Telephone Encounter (Signed)
Last OV 12/19/19 Lorazepam last filled 09/19/19 #90 with 0

## 2020-01-10 ENCOUNTER — Other Ambulatory Visit: Payer: Self-pay | Admitting: Family Medicine

## 2020-01-17 ENCOUNTER — Other Ambulatory Visit: Payer: Self-pay

## 2020-01-17 ENCOUNTER — Other Ambulatory Visit (INDEPENDENT_AMBULATORY_CARE_PROVIDER_SITE_OTHER): Payer: Medicare HMO

## 2020-01-17 DIAGNOSIS — E890 Postprocedural hypothyroidism: Secondary | ICD-10-CM

## 2020-01-17 DIAGNOSIS — M8000XS Age-related osteoporosis with current pathological fracture, unspecified site, sequela: Secondary | ICD-10-CM

## 2020-01-17 DIAGNOSIS — E7800 Pure hypercholesterolemia, unspecified: Secondary | ICD-10-CM

## 2020-01-17 DIAGNOSIS — I10 Essential (primary) hypertension: Secondary | ICD-10-CM | POA: Diagnosis not present

## 2020-01-17 LAB — LIPID PANEL
Cholesterol: 180 mg/dL (ref 0–200)
HDL: 67.3 mg/dL (ref >39.00)
LDL Cholesterol: 90 mg/dL (ref 0–99)
NonHDL: 112.44
Total CHOL/HDL Ratio: 3
Triglycerides: 114 mg/dL (ref 0.0–149.0)
VLDL: 22.8 mg/dL (ref 0.0–40.0)

## 2020-01-17 LAB — COMPREHENSIVE METABOLIC PANEL
ALT: 13 U/L (ref 0–35)
AST: 19 U/L (ref 0–37)
Albumin: 4.2 g/dL (ref 3.5–5.2)
Alkaline Phosphatase: 48 U/L (ref 39–117)
BUN: 10 mg/dL (ref 6–23)
CO2: 30 mEq/L (ref 19–32)
Calcium: 8.8 mg/dL (ref 8.4–10.5)
Chloride: 103 mEq/L (ref 96–112)
Creatinine, Ser: 0.78 mg/dL (ref 0.40–1.20)
GFR: 72.13 mL/min (ref >60.00)
Glucose, Bld: 117 mg/dL — ABNORMAL HIGH (ref 70–99)
Potassium: 3.6 mEq/L (ref 3.5–5.1)
Sodium: 140 mEq/L (ref 135–145)
Total Bilirubin: 0.5 mg/dL (ref 0.2–1.2)
Total Protein: 6.8 g/dL (ref 6.0–8.3)

## 2020-01-17 LAB — TSH: TSH: 126.74 u[IU]/mL — ABNORMAL HIGH (ref 0.35–4.50)

## 2020-01-17 LAB — VITAMIN D 25 HYDROXY (VIT D DEFICIENCY, FRACTURES): VITD: 41.2 ng/mL (ref 30.00–100.00)

## 2020-01-18 ENCOUNTER — Telehealth: Payer: Self-pay

## 2020-01-18 ENCOUNTER — Encounter: Payer: Self-pay | Admitting: Family Medicine

## 2020-01-18 NOTE — Telephone Encounter (Signed)
I need to increase her dose-please have her verify what she is taking-her last 2 doses are still in the med list Thanks

## 2020-01-18 NOTE — Telephone Encounter (Signed)
Discussed labs from 01-18-2020, patient said she hasn't missed any dosages of levothyroxine

## 2020-01-21 NOTE — Addendum Note (Signed)
Addended by: Josetta Huddle on: 01/21/2020 02:34 PM   Modules accepted: Orders

## 2020-01-21 NOTE — Telephone Encounter (Signed)
I pended a px for levothyroxine 75 mcg (going up 2 steps with it since TSH is so high)  Please send to her preferred pharmacy  Make sure she is clear that this is the new dose (she should put the 25 mcg out of sight) Re check TSH in about a month

## 2020-01-21 NOTE — Telephone Encounter (Signed)
Patient is currently taking Levothyroxine 25 mcg per day.

## 2020-01-21 NOTE — Addendum Note (Signed)
Addended by: Loura Pardon A on: 01/21/2020 02:50 PM   Modules accepted: Orders

## 2020-01-22 MED ORDER — LEVOTHYROXINE SODIUM 75 MCG PO TABS: 75.0000 ug | ORAL_TABLET | Freq: Every day | ORAL | 1 refills | Status: DC

## 2020-01-22 NOTE — Telephone Encounter (Signed)
Patient advised.  Rx sent to preferred pharmacy.

## 2020-01-22 NOTE — Addendum Note (Signed)
Addended by: Josetta Huddle on: 01/22/2020 09:37 AM   Modules accepted: Orders

## 2020-02-14 ENCOUNTER — Telehealth: Payer: Self-pay | Admitting: Family Medicine

## 2020-02-14 DIAGNOSIS — R7309 Other abnormal glucose: Secondary | ICD-10-CM | POA: Insufficient documentation

## 2020-02-14 DIAGNOSIS — E890 Postprocedural hypothyroidism: Secondary | ICD-10-CM

## 2020-02-14 NOTE — Telephone Encounter (Signed)
-----   Message from Ellamae Sia sent at 02/07/2020 12:10 PM EDT ----- Regarding: lab orders for Wednesday, 6.16.21 Lab order for TSH?

## 2020-02-20 ENCOUNTER — Other Ambulatory Visit: Payer: Self-pay

## 2020-02-20 ENCOUNTER — Other Ambulatory Visit (INDEPENDENT_AMBULATORY_CARE_PROVIDER_SITE_OTHER): Payer: Medicare HMO

## 2020-02-20 DIAGNOSIS — R7309 Other abnormal glucose: Secondary | ICD-10-CM | POA: Diagnosis not present

## 2020-02-20 DIAGNOSIS — E890 Postprocedural hypothyroidism: Secondary | ICD-10-CM | POA: Diagnosis not present

## 2020-02-20 LAB — HEMOGLOBIN A1C: Hgb A1c MFr Bld: 6.7 % — ABNORMAL HIGH (ref 4.6–6.5)

## 2020-02-20 LAB — TSH: TSH: 10.32 u[IU]/mL — ABNORMAL HIGH (ref 0.35–4.50)

## 2020-02-29 ENCOUNTER — Other Ambulatory Visit: Payer: Self-pay | Admitting: Family Medicine

## 2020-03-02 ENCOUNTER — Other Ambulatory Visit: Payer: Self-pay | Admitting: Family Medicine

## 2020-03-03 NOTE — Telephone Encounter (Signed)
F/u scheduled on 03/07/20, last filled on 01/01/20 #30 tabs with 1 refill, please advise

## 2020-03-07 ENCOUNTER — Ambulatory Visit (INDEPENDENT_AMBULATORY_CARE_PROVIDER_SITE_OTHER): Payer: Medicare HMO | Admitting: Family Medicine

## 2020-03-07 ENCOUNTER — Encounter: Payer: Self-pay | Admitting: Family Medicine

## 2020-03-07 ENCOUNTER — Other Ambulatory Visit: Payer: Self-pay

## 2020-03-07 VITALS — BP 138/84 | HR 81 | Temp 97.3°F | Wt 111.0 lb

## 2020-03-07 DIAGNOSIS — I10 Essential (primary) hypertension: Secondary | ICD-10-CM | POA: Diagnosis not present

## 2020-03-07 DIAGNOSIS — R7309 Other abnormal glucose: Secondary | ICD-10-CM | POA: Diagnosis not present

## 2020-03-07 DIAGNOSIS — R202 Paresthesia of skin: Secondary | ICD-10-CM

## 2020-03-07 DIAGNOSIS — E890 Postprocedural hypothyroidism: Secondary | ICD-10-CM

## 2020-03-07 DIAGNOSIS — R7303 Prediabetes: Secondary | ICD-10-CM | POA: Insufficient documentation

## 2020-03-07 DIAGNOSIS — E119 Type 2 diabetes mellitus without complications: Secondary | ICD-10-CM | POA: Insufficient documentation

## 2020-03-07 MED ORDER — LEVOTHYROXINE SODIUM 100 MCG PO TABS: 100.0000 ug | ORAL_TABLET | Freq: Every day | ORAL | 3 refills | Status: DC

## 2020-03-07 NOTE — Assessment & Plan Note (Signed)
Lab Results  Component Value Date   TSH 10.32 (H) 02/20/2020   this is much improved but not quite in range and pt is sluggish Takes her medication properly  Inc dose of levothyroxine to 100 mcg daily  Re check in 3 mo before next visit

## 2020-03-07 NOTE — Progress Notes (Signed)
Subjective:    Patient ID: [name] [name], female    DOB: [birth year], 74 y.o.   MRN: [id number]  This visit occurred during the SARS-CoV-2 public health emergency.  Safety protocols were in place, including screening questions prior to the visit, additional usage of staff PPE, and extensive cleaning of exam room while observing appropriate contact time as indicated for disinfecting solutions.    HPI  Pt presents for f/u of chronic health problems and numbness   Wt Readings from Last 3 Encounters:  03/07/20 111 lb (50.3 kg)  12/19/19 112 lb 1 oz (50.8 kg)  11/23/19 113 lb 8 oz (51.5 kg)  no big changes  19.66 kg/m   Having some numbness  Last CS film: DG Cervical Spine Complete (Accession [id number]) (Order [id number]) Imaging Date: 11/23/2019 Department: [facility] Released By: Cloyd Stagers, RT Authorizing: Leondra Cullin, Wynelle Fanny, MD  Exam Status  Status  Final [99]  PACS Intelerad Image Link  Show images for DG Cervical Spine Complete Study Result  Narrative & Impression  CLINICAL DATA:  One month of neck pain  EXAM: CERVICAL SPINE - COMPLETE 4+ VIEW  COMPARISON:  CT 10/18/2016  FINDINGS: Suboptimal visualization of cervicothoracic junction. Vertebral body heights and disc spaces are grossly maintained. Normal prevertebral soft tissue thickness. Possible foraminal narrowing on the left at C3-C4 and C4-C5. Carotid vascular calcifications. Dens and lateral masses are within normal limits.  IMPRESSION: No acute osseous abnormality. Mild degenerative changes as detailed above   Electronically Signed   By: Donavan Foil M.D.   On: 11/23/2019 23:53  Neck is feeling better     Hypothyroidism Lab Results  Component Value Date   TSH 10.32 (H) 02/20/2020   this was much improved from 126.74 the time before  Right now taking 75 mcg daily  Still feeling kind of sluggish/no energy     Glucose is also elevated  117 with  Lab  Results  Component Value Date   HGBA1C 6.7 (H) 02/20/2020   Has never been in DM range before  Brother had DM Mother died with DM  (had it a long time) -she was on insulin  Neither were obese   She stays very thirsty  She also urinates a lot  This is relatively now   Tingling in hands /some in legs/arms depending  Not in feet   Eats healthy  No fried foods Eats vegetables  Not a lot of sugar - some in her tea (does not drink a lot)  Does drink soda -perhaps one daily - coke  Sweets - perhaps once every 2 weeks   Protein - eats cheese and peanut butter Eats meat once in a while   Exercise - used to walk (not does not want to walk by herself )  Works in [employer] work    HTN bp is stable today  No cp or palpitations or headaches or edema  No side effects to medicines  BP Readings from Last 3 Encounters:  03/07/20 138/84  12/19/19 135/80  11/23/19 (!) 141/88     Patient Active Problem List   Diagnosis Date Noted  . Elevated hemoglobin A1c 03/07/2020  . Tingling in extremities 03/07/2020  . Elevated random blood glucose level 02/14/2020  . Neck pain 11/05/2019  . Medicare annual wellness visit, subsequent 10/16/2018  . Skin lesion of face 10/16/2018  . Benign neoplasm of ascending colon   . Benign neoplasm of transverse colon   . Melanosis,  colon   . Other specified diseases of esophagus   . Stomach irritation   . Constipation 01/24/2018  . Compression fracture of L1 vertebra (HCC) 01/24/2018  . Anxiety 01/09/2018  . Coronary artery calcification seen on CAT scan 11/25/2017  . Special screening for malignant neoplasms, colon 11/01/2017  . Estrogen deficiency 09/26/2017  . Aortic calcification ([place]) 11/30/2016  . History of patellar fracture 11/30/2016  . Routine general medical examination at a health care facility 06/27/2016  . Dyspepsia 06/04/2016  . Loss of weight 06/04/2016  . History of vertebral compression fracture 01/19/2016  . Esophageal  dysphagia 06/28/2014  . Chronic foot pain 10/27/2011  . Low back pain 04/14/2011  . FEVER BLISTER 11/17/2010  . Essential hypertension 08/18/2009  . BUNION 03/04/2009  . Allergic rhinitis 01/10/2008  . H/O goiter 02/09/2007  . Hypothyroidism 02/09/2007  . HYPERCHOLESTEROLEMIA 02/09/2007  . History of depression 02/09/2007  . OSTEOARTHRITIS 02/09/2007  . Osteoporosis 02/09/2007  . Disturbance in sleep behavior 02/09/2007   Past Medical History:  Diagnosis Date  . COPD (chronic obstructive pulmonary disease) ([place])   . Depression   . Hyperlipidemia   . Hypertension   . Hypothyroidism   . Osteoarthritis   . Osteoporosis   . Shingles    arm 1/12  . Sleep disorder   . Tobacco abuse    past; quit 09   Past Surgical History:  Procedure Laterality Date  . ABDOMINAL HYSTERECTOMY    . BIOPSY THYROID     pos neoplasm, surgery 09/2006  . bunions  06/1998  . COLONOSCOPY WITH PROPOFOL N/A 03/15/2018   Procedure: COLONOSCOPY WITH PROPOFOL;  Surgeon: Virgel Manifold, MD;  Location: [facility] ENDOSCOPY;  Service: Endoscopy;  Laterality: N/A;  . CORONARY ANGIOPLASTY    . CXR-copd, ts partial compression fracture  12/2006  . dexa  12/1996  . ESOPHAGOGASTRODUODENOSCOPY (EGD) WITH PROPOFOL N/A 03/15/2018   Procedure: ESOPHAGOGASTRODUODENOSCOPY (EGD) WITH PROPOFOL;  Surgeon: Virgel Manifold, MD;  Location: [facility] ENDOSCOPY;  Service: Endoscopy;  Laterality: N/A;  . GANGLION CYST EXCISION Right 05/16/2019   Procedure: REMOVAL GANGLION OF WRIST;  Surgeon: Corky Mull, MD;  Location: [facility] ORS;  Service: Orthopedics;  Laterality: Right;  . hashimoto  02/1997  . hyerectomy- cervical ca cells    . LEFT HEART CATH AND CORONARY ANGIOGRAPHY Left 12/23/2017   Procedure: LEFT HEART CATH AND CORONARY ANGIOGRAPHY;  Surgeon: Minna Merritts, MD;  Location: [facility];  Service: Cardiovascular;  Laterality: Left;  . left wrist fracture     surgery  . right thyroid lobectomy, goiter, thyroiditis   12/2006  . stress fractures foot    . THYROIDECTOMY  11/2009   Social History   Tobacco Use  . Smoking status: Former Smoker    Packs/day: 0.50    Types: Cigarettes    Quit date: 08/07/2019    Years since quitting: 0.5  . Smokeless tobacco: Never Used  Vaping Use  . Vaping Use: Never used  Substance Use Topics  . Alcohol use: No    Alcohol/week: 0.0 standard drinks  . Drug use: No   Family History  Problem Relation Age of Onset  . Stroke Father   . Hypertension Father   . Diabetes Mother   . Coronary artery disease Mother    Allergies  Allergen Reactions  . Fosamax [Alendronate Sodium]     Dysphagia and heartburn   . Raloxifene Hcl Other (See Comments)    Leg pain and cramps  Leg pain and  cramps    Current Outpatient Medications on File Prior to Visit  Medication Sig Dispense Refill  . albuterol (PROVENTIL HFA;VENTOLIN HFA) 108 (90 BASE) MCG/ACT inhaler Inhale 2 puffs into the lungs every 4 (four) hours as needed for wheezing. 1 Inhaler 11  . atorvastatin (LIPITOR) 80 MG tablet Take 1 tablet (80 mg total) by mouth daily. 90 tablet 3  . Calcium Carbonate-Vit D-Min (CALCIUM 1200 PO) Take 1 tablet by mouth daily.     . cyclobenzaprine (FLEXERIL) 10 MG tablet TAKE 1 TABLET BY MOUTH EVERYDAY AT BEDTIME 30 tablet 3  . ezetimibe (ZETIA) 10 MG tablet Take 1 tablet (10 mg total) by mouth daily. 90 tablet 3  . lactulose (CHRONULAC) 10 GM/15ML solution Take 15-30 mL by mouth once daily for 3 days or until you have a bowel movement 100 mL 0  . lisinopril (ZESTRIL) 10 MG tablet Take 1 tablet (10 mg total) by mouth daily. 90 tablet 3  . loratadine (CLARITIN) 10 MG tablet Take 1 tablet (10 mg total) by mouth daily.    Marland Kitchen LORazepam (ATIVAN) 1 MG tablet Take 1 tablet (1 mg total) by mouth at bedtime as needed for sleep. 90 tablet 0  . nortriptyline (PAMELOR) 25 MG capsule Take 1 capsule (25 mg total) by mouth at bedtime. Along with the 75 mg capsule 90 capsule 3  . nortriptyline (PAMELOR)  75 MG capsule Take 1 capsule (75 mg total) by mouth at bedtime. 90 capsule 3  . triamcinolone (KENALOG) 0.025 % cream Apply 1 application topically 2 (two) times daily. To affected itchy areas (Patient taking differently: Apply 1 application topically 2 (two) times daily as needed (itchy skin). To affected itchy areas) 15 g 1   No current facility-administered medications on file prior to visit.     Review of Systems  Constitutional: Positive for fatigue. Negative for activity change, appetite change, fever and unexpected weight change.  HENT: Negative for congestion, ear pain, rhinorrhea, sinus pressure and sore throat.   Eyes: Negative for pain, redness and visual disturbance.  Respiratory: Negative for cough, shortness of breath and wheezing.   Cardiovascular: Negative for chest pain and palpitations.  Gastrointestinal: Negative for abdominal pain, blood in stool, constipation and diarrhea.  Endocrine: Negative for polydipsia and polyuria.  Genitourinary: Negative for dysuria, frequency and urgency.  Musculoskeletal: Negative for arthralgias, back pain and myalgias.  Skin: Negative for pallor and rash.  Allergic/Immunologic: Negative for environmental allergies.  Neurological: Negative for dizziness, seizures, syncope, weakness, light-headedness and headaches.       Tingling- arms/legs Not in feet  Hematological: Negative for adenopathy. Does not bruise/bleed easily.  Psychiatric/Behavioral: Negative for decreased concentration and dysphoric mood. The patient is not nervous/anxious.        Objective:   Physical Exam Constitutional:      General: She is not in acute distress.    Appearance: Normal appearance. She is well-developed and normal weight. She is not ill-appearing or diaphoretic.  HENT:     Head: Normocephalic and atraumatic.     Mouth/Throat:     Mouth: Mucous membranes are moist.  Eyes:     General: No scleral icterus.    Conjunctiva/sclera: Conjunctivae normal.      Pupils: Pupils are equal, round, and reactive to light.  Neck:     Thyroid: No thyromegaly.     Vascular: No carotid bruit or JVD.  Cardiovascular:     Rate and Rhythm: Normal rate and regular rhythm.     Heart sounds:  Normal heart sounds. No gallop.   Pulmonary:     Effort: Pulmonary effort is normal. No respiratory distress.     Breath sounds: Normal breath sounds. No wheezing or rales.  Abdominal:     General: Bowel sounds are normal. There is no distension or abdominal bruit.     Palpations: Abdomen is soft. There is no mass.     Tenderness: There is no abdominal tenderness.  Musculoskeletal:        General: No tenderness.     Cervical back: Normal range of motion and neck supple.     Right lower leg: No edema.     Left lower leg: No edema.     Comments: Kyphosis noted   Lymphadenopathy:     Cervical: No cervical adenopathy.  Skin:    General: Skin is warm and dry.     Coloration: Skin is not pale.     Findings: No erythema or rash.  Neurological:     Mental Status: She is alert.     Sensory: No sensory deficit.     Motor: No weakness or tremor.     Coordination: Coordination normal.     Gait: Gait normal.     Deep Tendon Reflexes: Reflexes are normal and symmetric. Reflexes normal.  Psychiatric:        Mood and Affect: Mood normal.           Assessment & Plan:   Problem List Items Addressed This Visit      Cardiovascular and Mediastinum   Essential hypertension    bp in fair control at this time  BP Readings from Last 1 Encounters:  03/07/20 138/84   No changes needed Most recent labs reviewed  Disc lifstyle change with low sodium diet and exercise          Endocrine   Hypothyroidism - Primary    Lab Results  Component Value Date   TSH 10.32 (H) 02/20/2020   this is much improved but not quite in range and pt is sluggish Takes her medication properly  Inc dose of levothyroxine to 100 mcg daily  Re check in 3 mo before next visit       Relevant Medications   levothyroxine (SYNTHROID) 100 MCG tablet     Other   Elevated hemoglobin A1c    Lab Results  Component Value Date   HGBA1C 6.7 (H) 02/20/2020   Pt has a very strong fam hx of DM2 in family  She herself is not obese and does not need to loose wt  Disc diet- she can cut out sugar beverages (already avoids sweets for the most part)  We will want to replace some sugar calories with protein  Holding off on metformin or DM teaching for now but may consider later  Disc healthy activity  Regular meals/do not skip  F/u 3 mo  Nl foot exam  Disc imp of foot and eye care and good bp control       Tingling in extremities    Reassuring exam This may be related to thyroid or CS deg change or early diabetes  Will watch  Neck symptoms are much imp with PT but low threshold to ref to ortho if needed inst pt to alert [place] if worse or no improvement with tx of other health problems

## 2020-03-07 NOTE — Assessment & Plan Note (Signed)
bp in fair control at this time  BP Readings from Last 1 Encounters:  03/07/20 138/84   No changes needed Most recent labs reviewed  Disc lifstyle change with low sodium diet and exercise

## 2020-03-07 NOTE — Assessment & Plan Note (Addendum)
Reassuring exam This may be related to thyroid or CS deg change or early diabetes  Will watch  Neck symptoms are much imp with PT but low threshold to ref to ortho if needed inst pt to alert [place] if worse or no improvement with tx of other health problems Will add B12 level to next labs also

## 2020-03-07 NOTE — Patient Instructions (Addendum)
You need some protein with every meal  Meat and fish  Nuts and nut butters  Dairy products  Soy products  Beans (red/ black/pinto)  hummus    Try to get a lot of your carbohydrates from produce (fruits and veggies)   It is important to eat regularly   Get away from coke- have less often/same with sweet tea   Go up on levothyroxine to 100 mcg daily   Follow up in 3 months

## 2020-03-07 NOTE — Assessment & Plan Note (Signed)
Lab Results  Component Value Date   HGBA1C 6.7 (H) 02/20/2020   Pt has a very strong fam hx of DM2 in family  She herself is not obese and does not need to loose wt  Disc diet- she can cut out sugar beverages (already avoids sweets for the most part)  We will want to replace some sugar calories with protein  Holding off on metformin or DM teaching for now but may consider later  Disc healthy activity  Regular meals/do not skip  F/u 3 mo  Nl foot exam  Disc imp of foot and eye care and good bp control

## 2020-03-27 ENCOUNTER — Telehealth: Payer: Self-pay | Admitting: Family Medicine

## 2020-03-27 DIAGNOSIS — M8000XS Age-related osteoporosis with current pathological fracture, unspecified site, sequela: Secondary | ICD-10-CM

## 2020-03-27 NOTE — Telephone Encounter (Signed)
Last Prolia 10/11/19 PA on file #[id number], exp 09/05/20 Pt owes approx $275 (20% Prolia= $250, $25 admin fee)

## 2020-03-28 ENCOUNTER — Other Ambulatory Visit: Payer: Self-pay | Admitting: *Deleted

## 2020-03-28 NOTE — Telephone Encounter (Signed)
Name of Medication: Ativan Name of Pharmacy: [facility] Mail order Last Fill or Written Date and Quantity: 12/29/19 #90 tabs 0 refills Last Office Visit and Type: 03/07/20 f/u Next Office Visit and Type: 06/09/20 f/u

## 2020-03-30 MED ORDER — LORAZEPAM 1 MG PO TABS: 1.0000 mg | ORAL_TABLET | Freq: Every evening | ORAL | 0 refills | Status: DC | PRN

## 2020-04-25 NOTE — Telephone Encounter (Signed)
Scheduled pt of prolia lab on 8/25 and inj on 9/15. Put in lab order

## 2020-04-30 ENCOUNTER — Other Ambulatory Visit (INDEPENDENT_AMBULATORY_CARE_PROVIDER_SITE_OTHER): Payer: Medicare HMO

## 2020-04-30 ENCOUNTER — Other Ambulatory Visit: Payer: Self-pay

## 2020-04-30 DIAGNOSIS — M810 Age-related osteoporosis without current pathological fracture: Secondary | ICD-10-CM | POA: Diagnosis not present

## 2020-04-30 DIAGNOSIS — M8000XS Age-related osteoporosis with current pathological fracture, unspecified site, sequela: Secondary | ICD-10-CM

## 2020-04-30 LAB — BASIC METABOLIC PANEL
BUN: 14 mg/dL (ref 6–23)
CO2: 28 mEq/L (ref 19–32)
Calcium: 8.6 mg/dL (ref 8.4–10.5)
Chloride: 107 mEq/L (ref 96–112)
Creatinine, Ser: 0.84 mg/dL (ref 0.40–1.20)
GFR: 66.16 mL/min (ref >60.00)
Glucose, Bld: 103 mg/dL — ABNORMAL HIGH (ref 70–99)
Potassium: 4.2 mEq/L (ref 3.5–5.1)
Sodium: 140 mEq/L (ref 135–145)

## 2020-05-05 NOTE — Telephone Encounter (Signed)
CrCl is 46.39mL/min and Ca is normal.  Abner Greenspan, MD  04/30/2020 8:27 PM EDT Back to Top    Labs ok for prolia

## 2020-05-21 ENCOUNTER — Ambulatory Visit (INDEPENDENT_AMBULATORY_CARE_PROVIDER_SITE_OTHER): Payer: Medicare HMO

## 2020-05-21 ENCOUNTER — Other Ambulatory Visit: Payer: Self-pay

## 2020-05-21 DIAGNOSIS — M8000XD Age-related osteoporosis with current pathological fracture, unspecified site, subsequent encounter for fracture with routine healing: Secondary | ICD-10-CM | POA: Diagnosis not present

## 2020-05-21 DIAGNOSIS — M8000XS Age-related osteoporosis with current pathological fracture, unspecified site, sequela: Secondary | ICD-10-CM

## 2020-05-21 MED ORDER — DENOSUMAB 60 MG/ML ~~LOC~~ SOSY
60.0000 mg | PREFILLED_SYRINGE | Freq: Once | SUBCUTANEOUS | Status: AC
  Administered 2020-05-21: 60 mg via SUBCUTANEOUS

## 2020-05-21 NOTE — Progress Notes (Signed)
Per orders of Dr. Glori Bickers, injection of Prolia given in the left arm by Loreen Freud. Patient tolerated injection well.

## 2020-05-28 ENCOUNTER — Other Ambulatory Visit: Payer: Self-pay

## 2020-05-28 ENCOUNTER — Ambulatory Visit: Payer: Medicare HMO | Admitting: Dermatology

## 2020-05-28 ENCOUNTER — Encounter: Payer: Self-pay | Admitting: Dermatology

## 2020-05-28 DIAGNOSIS — L820 Inflamed seborrheic keratosis: Secondary | ICD-10-CM

## 2020-05-28 DIAGNOSIS — L720 Epidermal cyst: Secondary | ICD-10-CM | POA: Diagnosis not present

## 2020-05-28 DIAGNOSIS — L578 Other skin changes due to chronic exposure to nonionizing radiation: Secondary | ICD-10-CM

## 2020-05-28 DIAGNOSIS — L821 Other seborrheic keratosis: Secondary | ICD-10-CM

## 2020-05-28 NOTE — Progress Notes (Signed)
   Follow-Up Visit   Subjective  [name] [name] is a 74 y.o. female who presents for the following: Annual Exam (Pt here to have moles on her face checked today ). She declines total body skin exam today.  The following portions of the chart were reviewed this encounter and updated as appropriate:  Tobacco  Allergies  Meds  Problems  Med Hx  Surg Hx  Fam Hx     Review of Systems:  No other skin or systemic complaints except as noted in HPI or Assessment and Plan.  Objective  Well appearing patient in no apparent distress; mood and affect are within normal limits.  A focused examination was performed including face. Relevant physical exam findings are noted in the Assessment and Plan.  Objective  L cheek: Erythematous keratotic or waxy stuck-on papule or plaque.   Objective  Head - Anterior (Face): Smooth white papule(s).    Assessment & Plan  Inflamed seborrheic keratosis L cheek  Destruction of lesion - L cheek Complexity: simple   Destruction method: cryotherapy   Informed consent: discussed and consent obtained   Timeout:  patient name, date of birth, surgical site, and procedure verified Lesion destroyed using liquid nitrogen: Yes   Region frozen until ice ball extended beyond lesion: Yes   Outcome: patient tolerated procedure well with no complications   Post-procedure details: wound care instructions given    [place] Head - Anterior (Face)  Observe   Actinic Damage - diffuse scaly erythematous macules with underlying dyspigmentation - Recommend daily broad spectrum sunscreen SPF 30+ to sun-exposed areas, reapply every 2 hours as needed.  - Call for new or changing lesions.  Seborrheic Keratoses - Stuck-on, waxy, tan-brown papules and plaques  - Discussed benign etiology and prognosis. - Observe - Call for any changes  Return if symptoms worsen or fail to improve.  IMarye Round, CMA, am acting as scribe for Sarina Ser, MD  .  Documentation: I have reviewed the above documentation for accuracy and completeness, and I agree with the above.  Sarina Ser, MD

## 2020-05-28 NOTE — Patient Instructions (Signed)
Cryotherapy Aftercare  . Wash gently with soap and water everyday.   . Apply Vaseline and Band-Aid daily until healed. Recommend daily broad spectrum sunscreen SPF 30+ to sun-exposed areas, reapply every 2 hours as needed. Call for new or changing lesions.  

## 2020-05-30 ENCOUNTER — Other Ambulatory Visit: Payer: Self-pay | Admitting: Family Medicine

## 2020-06-09 ENCOUNTER — Telehealth: Payer: Self-pay | Admitting: *Deleted

## 2020-06-09 ENCOUNTER — Ambulatory Visit: Payer: Medicare HMO | Admitting: Family Medicine

## 2020-06-09 DIAGNOSIS — Z20822 Contact with and (suspected) exposure to covid-19: Secondary | ICD-10-CM | POA: Diagnosis not present

## 2020-06-09 DIAGNOSIS — J069 Acute upper respiratory infection, unspecified: Secondary | ICD-10-CM | POA: Diagnosis not present

## 2020-06-09 DIAGNOSIS — Z03818 Encounter for observation for suspected exposure to other biological agents ruled out: Secondary | ICD-10-CM | POA: Diagnosis not present

## 2020-06-09 NOTE — Telephone Encounter (Signed)
Patient called stating that she is not going to be able to make her appointment today with Dr. Glori Bickers because she got sick over the weekend. Patient stated that she has a cough, runny nose, diarrhea, headache and SOB with activities or when she coughs. Patient stated that she has not had her covid vaccines. Patient stated that she is not aware of being around anyone with covid. Patient was advised that Dr. Glori Bickers can do a virtual visit or she can go to an urgent care for a face to face evaluation. Patient stated that since she is having some SOB she would feel better seeing someone. Patient stated that she has been to the [facility] before and will plan on getting dressed and heading over to the UC now. Patient was given ER precautions and she verbalized understanding.

## 2020-06-09 NOTE — Telephone Encounter (Signed)
Agree with advisement  Will watch for correspondence  

## 2020-06-27 ENCOUNTER — Telehealth: Payer: Self-pay | Admitting: *Deleted

## 2020-06-27 MED ORDER — LORAZEPAM 1 MG PO TABS: 1.0000 mg | ORAL_TABLET | Freq: Every evening | ORAL | 0 refills | Status: DC | PRN

## 2020-06-27 MED ORDER — LEVOTHYROXINE SODIUM 100 MCG PO TABS: 100.0000 ug | ORAL_TABLET | Freq: Every day | ORAL | 0 refills | Status: DC

## 2020-06-27 NOTE — Telephone Encounter (Signed)
I sent a short supply of thyroid med to local pharmacy Please schedule lab for tsh

## 2020-06-27 NOTE — Telephone Encounter (Signed)
Name of Medication: Ativan Name of Pharmacy: [facility] Mail order Last Fill or Written Date and Quantity: 03/30/20 #90 tabs 0 refills Last Office Visit and Type: 03/07/20 f/u Next Office Visit and Type: none scheduled  Also want Rx for synthroid be sent to [facility] but ?? If pt needs f/u labs last TSH was abnormal

## 2020-06-30 NOTE — Telephone Encounter (Signed)
Patient scheduled lab appointment on 07/02/20.

## 2020-07-02 ENCOUNTER — Other Ambulatory Visit: Payer: Self-pay

## 2020-07-02 ENCOUNTER — Other Ambulatory Visit (INDEPENDENT_AMBULATORY_CARE_PROVIDER_SITE_OTHER): Payer: Medicare HMO

## 2020-07-02 DIAGNOSIS — E890 Postprocedural hypothyroidism: Secondary | ICD-10-CM | POA: Diagnosis not present

## 2020-07-02 DIAGNOSIS — I10 Essential (primary) hypertension: Secondary | ICD-10-CM | POA: Diagnosis not present

## 2020-07-02 DIAGNOSIS — R7309 Other abnormal glucose: Secondary | ICD-10-CM

## 2020-07-02 DIAGNOSIS — R202 Paresthesia of skin: Secondary | ICD-10-CM

## 2020-07-02 LAB — VITAMIN B12: Vitamin B-12: 177 pg/mL — ABNORMAL LOW (ref 211–911)

## 2020-07-02 LAB — BASIC METABOLIC PANEL
BUN: 10 mg/dL (ref 6–23)
CO2: 30 mEq/L (ref 19–32)
Calcium: 8.9 mg/dL (ref 8.4–10.5)
Chloride: 103 mEq/L (ref 96–112)
Creatinine, Ser: 0.64 mg/dL (ref 0.40–1.20)
GFR: 86.82 mL/min (ref >60.00)
Glucose, Bld: 91 mg/dL (ref 70–99)
Potassium: 3.8 mEq/L (ref 3.5–5.1)
Sodium: 141 mEq/L (ref 135–145)

## 2020-07-02 LAB — HEMOGLOBIN A1C: Hgb A1c MFr Bld: 6.3 % (ref 4.6–6.5)

## 2020-07-02 LAB — TSH: TSH: 0.09 u[IU]/mL — ABNORMAL LOW (ref 0.35–4.50)

## 2020-07-06 NOTE — Progress Notes (Signed)
Cardiology Office Note  Date:  07/07/2020   ID:  [name] [name], DOB [birth year], MRN [id number]  PCP:  Abner Greenspan, MD   Chief Complaint  Patient presents with  . Other    12 month follow up. Meds reviewed by the pt. verbally. Pt. c/o chest pain and shortness of breath with over exertion.     HPI:  [name] [name] is a 74 year old woman with history of Former smoker -quit in 09 Known aortic calcification on imaging /CT scan COPD Chronic back pain  Chronic chest pain Cardiac catheterization April 2019 with no significant coronary disease but she does have moderate diffuse calcification Presents for f/u of her chest pain, positive stress test, severe coronary calcification on CT scan,  cardiac catheterization  Weight low but stable Numbers reviewed from the past several years Chronic back pain, occasional chest discomfort but overall feels things are about the same  Tolerating Lipitor with Zetia , some medication noncompliance Sedentary, no regular exercise program Works in her garden  EKG personally reviewed by myself on todays visit Shows normal sinus rhythm rate 98 bpm no significant ST-T wave changes  Other past medical history reviewed Cardiac catheterization December 23, 2017 No significant coronary disease noted, moderate diffuse calcification noted  CT chest  2015 Images reviewed with her in detail Moderate aortic atherosclerosis particularly in the arch Very mild if any in the carotids Mild to moderate in the distal descending aorta, iliacs  PMH:   has a past medical history of COPD (chronic obstructive pulmonary disease) ([place]), Depression, Hyperlipidemia, Hypertension, Hypothyroidism, Osteoarthritis, Osteoporosis, Shingles, Sleep disorder, and Tobacco abuse.  PSH:    Past Surgical History:  Procedure Laterality Date  . ABDOMINAL HYSTERECTOMY    . BIOPSY THYROID     pos neoplasm, surgery 09/2006  . bunions  06/1998  . COLONOSCOPY WITH PROPOFOL N/A 03/15/2018    Procedure: COLONOSCOPY WITH PROPOFOL;  Surgeon: Virgel Manifold, MD;  Location: [facility];  Service: Endoscopy;  Laterality: N/A;  . CORONARY ANGIOPLASTY    . CXR-copd, ts partial compression fracture  12/2006  . dexa  12/1996  . ESOPHAGOGASTRODUODENOSCOPY (EGD) WITH PROPOFOL N/A 03/15/2018   Procedure: ESOPHAGOGASTRODUODENOSCOPY (EGD) WITH PROPOFOL;  Surgeon: Virgel Manifold, MD;  Location: [facility];  Service: Endoscopy;  Laterality: N/A;  . GANGLION CYST EXCISION Right 05/16/2019   Procedure: REMOVAL GANGLION OF WRIST;  Surgeon: Corky Mull, MD;  Location: [facility];  Service: Orthopedics;  Laterality: Right;  . hashimoto  02/1997  . hyerectomy- cervical ca cells    . LEFT HEART CATH AND CORONARY ANGIOGRAPHY Left 12/23/2017   Procedure: LEFT HEART CATH AND CORONARY ANGIOGRAPHY;  Surgeon: Minna Merritts, MD;  Location: [place] [facility];  Service: Cardiovascular;  Laterality: Left;  . left wrist fracture     surgery  . right thyroid lobectomy, goiter, thyroiditis  12/2006  . stress fractures foot    . THYROIDECTOMY  11/2009    Current Outpatient Medications  Medication Sig Dispense Refill  . albuterol (PROVENTIL HFA;VENTOLIN HFA) 108 (90 BASE) MCG/ACT inhaler Inhale 2 puffs into the lungs every 4 (four) hours as needed for wheezing. 1 Inhaler 11  . atorvastatin (LIPITOR) 80 MG tablet Take 1 tablet (80 mg total) by mouth daily. 90 tablet 3  . Calcium Carbonate-Vit D-Min (CALCIUM 1200 PO) Take 1 tablet by mouth daily.     . cyclobenzaprine (FLEXERIL) 10 MG tablet TAKE 1 TABLET BY MOUTH EVERYDAY AT BEDTIME 30 tablet 3  .  ezetimibe (ZETIA) 10 MG tablet Take 1 tablet (10 mg total) by mouth daily. 90 tablet 3  . lactulose (CHRONULAC) 10 GM/15ML solution Take 15-30 mL by mouth once daily for 3 days or until you have a bowel movement 100 mL 0  . levothyroxine (SYNTHROID) 100 MCG tablet Take 1 tablet (100 mcg total) by mouth daily. 30 tablet 0  . lisinopril (ZESTRIL) 10 MG  tablet Take 1 tablet (10 mg total) by mouth daily. 90 tablet 3  . loratadine (CLARITIN) 10 MG tablet Take 1 tablet (10 mg total) by mouth daily.    Marland Kitchen LORazepam (ATIVAN) 1 MG tablet Take 1 tablet (1 mg total) by mouth at bedtime as needed for sleep. 90 tablet 0  . nortriptyline (PAMELOR) 25 MG capsule Take 1 capsule (25 mg total) by mouth at bedtime. Along with the 75 mg capsule 90 capsule 3  . nortriptyline (PAMELOR) 75 MG capsule Take 1 capsule (75 mg total) by mouth at bedtime. 90 capsule 3  . predniSONE (DELTASONE) 5 MG tablet 6 DAY TAPER, TAKE AS DIRECTED WITH FOOD    . triamcinolone (KENALOG) 0.025 % cream Apply 1 application topically 2 (two) times daily. To affected itchy areas (Patient taking differently: Apply 1 application topically 2 (two) times daily as needed (itchy skin). To affected itchy areas) 15 g 1  . doxycycline (VIBRA-TABS) 100 MG tablet  (Patient not taking: Reported on 07/07/2020)     No current facility-administered medications for this visit.     Allergies:   Fosamax [alendronate sodium] and Raloxifene hcl   Social History:  The patient  reports that she quit smoking about 11 months ago. Her smoking use included cigarettes. She smoked 0.50 packs per day. She has never used smokeless tobacco. She reports that she does not drink alcohol and does not use drugs.   Family History:   family history includes Coronary artery disease in her mother; Diabetes in her mother; Hypertension in her father; Stroke in her father.   Review of Systems: Review of Systems  Constitutional: Negative.   Respiratory: Negative.   Cardiovascular: Positive for chest pain.  Gastrointestinal: Positive for heartburn.  Musculoskeletal: Negative.   Neurological: Negative.   Psychiatric/Behavioral: Negative.   All other systems reviewed and are negative.   PHYSICAL EXAM: VS:  BP 134/80 (BP Location: Left Arm, Patient Position: Sitting, Cuff Size: Normal)   Pulse 98   Ht 5\' 3"  (1.6 m)   Wt 109  lb 8 oz (49.7 kg)   SpO2 97%   BMI 19.40 kg/m  , BMI Body mass index is 19.4 kg/m. Constitutional:  oriented to person, place, and time. No distress.  HENT:  Head: Grossly normal Eyes:  no discharge. No scleral icterus.  Neck: No JVD, no carotid bruits  Cardiovascular: Regular rate and rhythm, no murmurs appreciated Pulmonary/Chest: Clear to auscultation bilaterally, no wheezes or rails Abdominal: Soft.  no distension.  no tenderness.  Musculoskeletal: Normal range of motion Neurological:  normal muscle tone. Coordination normal. No atrophy Skin: Skin warm and dry Psychiatric: normal affect, pleasant  Recent Labs: 01/17/2020: ALT 13 07/02/2020: BUN 10; Creatinine, Ser 0.64; Potassium 3.8; Sodium 141; TSH 0.09    Lipid Panel Lab Results  Component Value Date   CHOL 180 01/17/2020   HDL 67.30 01/17/2020   LDLCALC 90 01/17/2020   TRIG 114.0 01/17/2020      Wt Readings from Last 3 Encounters:  07/07/20 109 lb 8 oz (49.7 kg)  03/07/20 111 lb (50.3 kg)  12/19/19 112 lb 1 oz (50.8 kg)     ASSESSMENT AND PLAN:  Aortic calcification (HCC) On Lipitor 80 Zetia 10 mg daily Stressed importance of medication compliance  Hyperlipidemia  Lipitor 80 daily,   Zetia 10 mg daily Ideally goal LDL less than 70, recommend she take medications daily  Chest pain, unspecified type -   cardiac catheterization showing heavy  calcification with no significant stenosis, 2019 Smoking cessation recommended, compliance with Lipitor and Zetia  Coronary artery calcification seen on CAT scan Calcification noted in the LAD Catheterization results as above  TOBACCO USE, QUIT Reports that she quit in 2009 Denies shortness of breath on exertion  PVCs Asymptomatic, for any worsening symptoms would add beta-blocker  GERD Takes omeprazole as needed  Essential hypertension Blood pressure is well controlled on today's visit. No changes made to the medications.    Total encounter time more  than 25 minutes  Greater than 50% was spent in counseling and coordination of care with the patient    No orders of the defined types were placed in this encounter.    Signed, Esmond Plants, M.D., Ph.D. 07/07/2020  [place], [place]

## 2020-07-07 ENCOUNTER — Other Ambulatory Visit: Payer: Self-pay

## 2020-07-07 ENCOUNTER — Ambulatory Visit (INDEPENDENT_AMBULATORY_CARE_PROVIDER_SITE_OTHER): Payer: Medicare HMO | Admitting: Cardiovascular Disease

## 2020-07-07 ENCOUNTER — Encounter: Payer: Self-pay | Admitting: Cardiovascular Disease

## 2020-07-07 VITALS — BP 134/80 | HR 98 | Ht 63.0 in | Wt 109.5 lb

## 2020-07-07 DIAGNOSIS — I10 Essential (primary) hypertension: Secondary | ICD-10-CM | POA: Diagnosis not present

## 2020-07-07 DIAGNOSIS — E7800 Pure hypercholesterolemia, unspecified: Secondary | ICD-10-CM | POA: Diagnosis not present

## 2020-07-07 DIAGNOSIS — I700 Atherosclerosis of aorta: Secondary | ICD-10-CM | POA: Diagnosis not present

## 2020-07-07 DIAGNOSIS — R079 Chest pain, unspecified: Secondary | ICD-10-CM

## 2020-07-07 DIAGNOSIS — I2510 Atherosclerotic heart disease of native coronary artery without angina pectoris: Secondary | ICD-10-CM | POA: Diagnosis not present

## 2020-07-07 DIAGNOSIS — F17200 Nicotine dependence, unspecified, uncomplicated: Secondary | ICD-10-CM

## 2020-07-07 NOTE — Patient Instructions (Signed)
Medication Instructions:  No changes  If you need a refill on your cardiac medications before your next appointment, please call your pharmacy.    Lab work: No new labs needed   If you have labs (blood work) drawn today and your tests are completely normal, you will receive your results only by: . MyChart Message (if you have MyChart) OR . A paper copy in the mail If you have any lab test that is abnormal or we need to change your treatment, we will call you to review the results.   Testing/Procedures: No new testing needed   Follow-Up: At [facility], you and your health needs are our priority.  As part of our continuing mission to provide you with exceptional heart care, we have created designated Provider Care Teams.  These Care Teams include your primary Cardiologist (physician) and Advanced Practice Providers (APPs -  Physician Assistants and Nurse Practitioners) who all work together to provide you with the care you need, when you need it.  . You will need a follow up appointment in 12 months  . Providers on your designated Care Team:   . Christopher Berge, NP . Ryan Dunn, PA-C . Jacquelyn Visser, PA-C  Any Other Special Instructions Will Be Listed Below (If Applicable).  COVID-19 Vaccine Information can be found at: [email or web address] For questions related to vaccine distribution or appointments, please email [email or web address] or call [phone].     

## 2020-07-08 ENCOUNTER — Other Ambulatory Visit: Payer: Self-pay | Admitting: Family Medicine

## 2020-07-22 ENCOUNTER — Other Ambulatory Visit: Payer: Self-pay | Admitting: Family Medicine

## 2020-07-23 NOTE — Telephone Encounter (Signed)
Last filled on 03/03/20 #30 tabs with 3 refills, last OV was 03/07/20, please advise

## 2020-07-30 ENCOUNTER — Telehealth: Payer: Self-pay

## 2020-07-30 NOTE — Telephone Encounter (Signed)
I spoke with Museum/gallery conservator at [facility] and rx is in reference to refill for cyclobenzaprine requested by pt; refill for cyclobenzaprine done on 07/23/20 to [facility]. Could not reach pt to let her know cyclobenzaprine was sent to [facility]. Per DPR cannot speak with anyone else and the [phone] could not leave v/m. Sending note to [place].

## 2020-07-30 NOTE — Telephone Encounter (Signed)
[facility] - Client TELEPHONE ADVICE RECORD [facility] Patient Name: [name] [name] Gender: Female DOB: 1945-09-14 Age: 73 Y 10 M 17 D Return Phone Number: Address: City/State/Zip: [place] Client Site [place] Contact Type Call Who Is Calling Pharmacy Call Type Pharmacy Send to RN Chief Complaint Paging or Request for Consult Reason for Call Request to speak to Physician Initial Comment Caller states from pharmacy to follow up on a rx order for a pt. Pharmacy Name [facility] Pharmacist Name [place] Number [phone] Translation No Disp. Time Eilene Ghazi Time) Disposition Final User 07/29/2020 6:51:56 PM Clinical Call Yes Doy Mince, RN, [place] Ginger Comments User: Wendee Beavers, RN Date/Time Eilene Ghazi Time): 07/29/2020 6:51:51 PM Spoke with Mardene Celeste from [facility], who stated that she cannot give out any information to the RN because the zip code is not listed and cannot be verified. She does not know who Jinny Blossom is, who originally called and cannot contact her for additional information.

## 2020-07-30 NOTE — Telephone Encounter (Signed)
Pt called back and notified that cyclobenzaprine was sent to [facility]; pt voiced understanding and has already picked up medication. Nothing further needed.

## 2020-08-03 ENCOUNTER — Other Ambulatory Visit: Payer: Self-pay | Admitting: Family Medicine

## 2020-08-05 NOTE — Telephone Encounter (Signed)
Pleas schedule f/u (was supposed to f/u after last labs)   TSH was low/may need to change dose , also her B12 was low so she may need a shot as well   If she does not have enough thyroid med to last until then please refill short supply

## 2020-08-05 NOTE — Telephone Encounter (Signed)
Pt said she just got a order in the mail so doesn't need Rx from local pharmacy. F/u appt scheduled on 08/07/20

## 2020-08-05 NOTE — Telephone Encounter (Signed)
TSH labs were abnormal on 07/02/20, pt has no f/u or future appts. Or a phone note to address abnormal labs so not sure if pt should be on this dose, will route to PCP for review

## 2020-08-07 ENCOUNTER — Other Ambulatory Visit: Payer: Self-pay

## 2020-08-07 ENCOUNTER — Encounter: Payer: Self-pay | Admitting: Family Medicine

## 2020-08-07 ENCOUNTER — Ambulatory Visit (INDEPENDENT_AMBULATORY_CARE_PROVIDER_SITE_OTHER): Payer: Medicare HMO | Admitting: Family Medicine

## 2020-08-07 VITALS — BP 128/76 | HR 79 | Temp 96.9°F | Ht 63.0 in | Wt 110.3 lb

## 2020-08-07 DIAGNOSIS — J449 Chronic obstructive pulmonary disease, unspecified: Secondary | ICD-10-CM | POA: Diagnosis not present

## 2020-08-07 DIAGNOSIS — E890 Postprocedural hypothyroidism: Secondary | ICD-10-CM

## 2020-08-07 DIAGNOSIS — E538 Deficiency of other specified B group vitamins: Secondary | ICD-10-CM | POA: Diagnosis not present

## 2020-08-07 DIAGNOSIS — R202 Paresthesia of skin: Secondary | ICD-10-CM | POA: Diagnosis not present

## 2020-08-07 DIAGNOSIS — R7303 Prediabetes: Secondary | ICD-10-CM

## 2020-08-07 DIAGNOSIS — Z23 Encounter for immunization: Secondary | ICD-10-CM

## 2020-08-07 LAB — TSH: TSH: 0.05 u[IU]/mL — ABNORMAL LOW (ref 0.35–4.50)

## 2020-08-07 MED ORDER — CYANOCOBALAMIN 1000 MCG/ML IJ SOLN
1000.0000 ug | Freq: Once | INTRAMUSCULAR | Status: AC
  Administered 2020-08-07: 1000 ug via INTRAMUSCULAR

## 2020-08-07 NOTE — Assessment & Plan Note (Signed)
Low B12 may contribute Will start supplementing this

## 2020-08-07 NOTE — Assessment & Plan Note (Addendum)
Lab Results  Component Value Date   VITAMINB12 177 (L) 07/02/2020   Shot today  Then one weekly for 3 more weeks  Then check level in 5 wk Handout given  Enc more B12 rich foods  Will consider oral B12 also once we get a level

## 2020-08-07 NOTE — Progress Notes (Signed)
Subjective:    Patient ID: [name] [name], female    DOB: April 04, 1946, 74 y.o.   MRN: [id number]  This visit occurred during the SARS-CoV-2 public health emergency.  Safety protocols were in place, including screening questions prior to the visit, additional usage of staff PPE, and extensive cleaning of exam room while observing appropriate contact time as indicated for disinfecting solutions.    HPI Pt presents for f/u of chronic health problems  Wt Readings from Last 3 Encounters:  08/07/20 110 lb 5 oz (50 kg)  07/07/20 109 lb 8 oz (49.7 kg)  03/07/20 111 lb (50.3 kg)  wt is stable  Appetite is good  19.54 kg/m  Pt had labs in October   Hypothyroid  Tired more often  Not hyped up but a little anxious /nervous  Lab Results  Component Value Date   TSH 0.09 (L) 07/02/2020    Takes thyroid medicine medicine correctly and does not miss doses   Needs re check     Chemistry      Component Value Date/Time   NA 141 07/02/2020 0951   NA 138 12/13/2013 1904   K 3.8 07/02/2020 0951   K 3.6 12/13/2013 1904   CL 103 07/02/2020 0951   CL 104 12/13/2013 1904   CO2 30 07/02/2020 0951   CO2 29 12/13/2013 1904   BUN 10 07/02/2020 0951   BUN 12 12/13/2013 1904   CREATININE 0.64 07/02/2020 0951   CREATININE 0.72 12/13/2013 1904      Component Value Date/Time   CALCIUM 8.9 07/02/2020 0951   CALCIUM 8.6 12/13/2013 1904   ALKPHOS 48 01/17/2020 0955   AST 19 01/17/2020 0955   ALT 13 01/17/2020 0955   BILITOT 0.5 01/17/2020 0955       Lab Results  Component Value Date   VITAMINB12 177 (L) 07/02/2020   New B12 deficiency  Test done for tingling  Hands and feet  She eats just about anything   Prediabetes Lab Results  Component Value Date   HGBA1C 6.3 07/02/2020  currently takes levothyroxine 100 mcg daily   Patient Active Problem List   Diagnosis Date Noted  . Vitamin B12 deficiency 08/07/2020  . Prediabetes 03/07/2020  . Tingling in extremities 03/07/2020  .  Elevated random blood glucose level 02/14/2020  . Neck pain 11/05/2019  . Medicare annual wellness visit, subsequent 10/16/2018  . Skin lesion of face 10/16/2018  . Benign neoplasm of ascending colon   . Benign neoplasm of transverse colon   . Melanosis, colon   . Other specified diseases of esophagus   . Stomach irritation   . Constipation 01/24/2018  . Compression fracture of L1 vertebra (HCC) 01/24/2018  . Anxiety 01/09/2018  . Coronary artery calcification seen on CAT scan 11/25/2017  . Special screening for malignant neoplasms, colon 11/01/2017  . Estrogen deficiency 09/26/2017  . Aortic calcification ([place]) 11/30/2016  . History of patellar fracture 11/30/2016  . Routine general medical examination at a health care facility 06/27/2016  . Dyspepsia 06/04/2016  . Loss of weight 06/04/2016  . History of vertebral compression fracture 01/19/2016  . Esophageal dysphagia 06/28/2014  . Chronic foot pain 10/27/2011  . Low back pain 04/14/2011  . FEVER BLISTER 11/17/2010  . Essential hypertension 08/18/2009  . BUNION 03/04/2009  . Allergic rhinitis 01/10/2008  . H/O goiter 02/09/2007  . Hypothyroidism 02/09/2007  . HYPERCHOLESTEROLEMIA 02/09/2007  . History of depression 02/09/2007  . OSTEOARTHRITIS 02/09/2007  . Osteoporosis 02/09/2007  . Disturbance in  sleep behavior 02/09/2007   Past Medical History:  Diagnosis Date  . COPD (chronic obstructive pulmonary disease) ([place])   . Depression   . Hyperlipidemia   . Hypertension   . Hypothyroidism   . Osteoarthritis   . Osteoporosis   . Shingles    arm 1/12  . Sleep disorder   . Tobacco abuse    past; quit 09   Past Surgical History:  Procedure Laterality Date  . ABDOMINAL HYSTERECTOMY    . BIOPSY THYROID     pos neoplasm, surgery 09/2006  . bunions  06/1998  . COLONOSCOPY WITH PROPOFOL N/A 03/15/2018   Procedure: COLONOSCOPY WITH PROPOFOL;  Surgeon: Virgel Manifold, MD;  Location: [facility];  Service:  Endoscopy;  Laterality: N/A;  . CORONARY ANGIOPLASTY    . CXR-copd, ts partial compression fracture  12/2006  . dexa  12/1996  . ESOPHAGOGASTRODUODENOSCOPY (EGD) WITH PROPOFOL N/A 03/15/2018   Procedure: ESOPHAGOGASTRODUODENOSCOPY (EGD) WITH PROPOFOL;  Surgeon: Virgel Manifold, MD;  Location: [facility];  Service: Endoscopy;  Laterality: N/A;  . GANGLION CYST EXCISION Right 05/16/2019   Procedure: REMOVAL GANGLION OF WRIST;  Surgeon: Corky Mull, MD;  Location: [facility];  Service: Orthopedics;  Laterality: Right;  . hashimoto  02/1997  . hyerectomy- cervical ca cells    . LEFT HEART CATH AND CORONARY ANGIOGRAPHY Left 12/23/2017   Procedure: LEFT HEART CATH AND CORONARY ANGIOGRAPHY;  Surgeon: Minna Merritts, MD;  Location: [place] [facility];  Service: Cardiovascular;  Laterality: Left;  . left wrist fracture     surgery  . right thyroid lobectomy, goiter, thyroiditis  12/2006  . stress fractures foot    . THYROIDECTOMY  11/2009   Social History   Tobacco Use  . Smoking status: Former Smoker    Packs/day: 0.50    Types: Cigarettes    Quit date: 08/07/2019    Years since quitting: 1.0  . Smokeless tobacco: Never Used  Vaping Use  . Vaping Use: Never used  Substance Use Topics  . Alcohol use: No    Alcohol/week: 0.0 standard drinks  . Drug use: No   Family History  Problem Relation Age of Onset  . Stroke Father   . Hypertension Father   . Diabetes Mother   . Coronary artery disease Mother    Allergies  Allergen Reactions  . Fosamax [Alendronate Sodium]     Dysphagia and heartburn   . Raloxifene Hcl Other (See Comments)    Leg pain and cramps  Leg pain and cramps    Current Outpatient Medications on File Prior to Visit  Medication Sig Dispense Refill  . albuterol (PROVENTIL HFA;VENTOLIN HFA) 108 (90 BASE) MCG/ACT inhaler Inhale 2 puffs into the lungs every 4 (four) hours as needed for wheezing. 1 Inhaler 11  . atorvastatin (LIPITOR) 80 MG tablet Take 1 tablet (80  mg total) by mouth daily. 90 tablet 3  . Calcium Carbonate-Vit D-Min (CALCIUM 1200 PO) Take 1 tablet by mouth daily.     . cyclobenzaprine (FLEXERIL) 10 MG tablet TAKE 1 TABLET BY MOUTH EVERYDAY AT BEDTIME 30 tablet 3  . doxycycline (VIBRA-TABS) 100 MG tablet     . lactulose (CHRONULAC) 10 GM/15ML solution Take 15-30 mL by mouth once daily for 3 days or until you have a bowel movement 100 mL 0  . levothyroxine (SYNTHROID) 100 MCG tablet Take 1 tablet (100 mcg total) by mouth daily. 30 tablet 0  . lisinopril (ZESTRIL) 10 MG tablet Take 1 tablet (10  mg total) by mouth daily. 90 tablet 3  . loratadine (CLARITIN) 10 MG tablet Take 1 tablet (10 mg total) by mouth daily.    Marland Kitchen LORazepam (ATIVAN) 1 MG tablet Take 1 tablet (1 mg total) by mouth at bedtime as needed for sleep. 90 tablet 0  . nortriptyline (PAMELOR) 25 MG capsule Take 1 capsule (25 mg total) by mouth at bedtime. Along with the 75 mg capsule 90 capsule 3  . nortriptyline (PAMELOR) 75 MG capsule Take 1 capsule (75 mg total) by mouth at bedtime. 90 capsule 3  . triamcinolone (KENALOG) 0.025 % cream Apply 1 application topically 2 (two) times daily. To affected itchy areas (Patient taking differently: Apply 1 application topically 2 (two) times daily as needed (itchy skin). To affected itchy areas) 15 g 1  . ezetimibe (ZETIA) 10 MG tablet Take 1 tablet (10 mg total) by mouth daily. 90 tablet 3   No current facility-administered medications on file prior to visit.    Review of Systems  Constitutional: Positive for fatigue. Negative for activity change, appetite change, fever and unexpected weight change.  HENT: Negative for congestion, ear pain, rhinorrhea, sinus pressure and sore throat.   Eyes: Negative for pain, redness and visual disturbance.  Respiratory: Negative for cough, shortness of breath and wheezing.   Cardiovascular: Negative for chest pain and palpitations.  Gastrointestinal: Negative for abdominal pain, blood in stool,  constipation and diarrhea.  Endocrine: Negative for polydipsia and polyuria.  Genitourinary: Negative for dysuria, frequency and urgency.  Musculoskeletal: Negative for arthralgias, back pain and myalgias.  Skin: Negative for pallor and rash.  Allergic/Immunologic: Negative for environmental allergies.  Neurological: Positive for numbness. Negative for dizziness, syncope and headaches.  Hematological: Negative for adenopathy. Does not bruise/bleed easily.  Psychiatric/Behavioral: Positive for sleep disturbance. Negative for decreased concentration and dysphoric mood. The patient is not nervous/anxious.        Objective:   Physical Exam Constitutional:      General: She is not in acute distress.    Appearance: Normal appearance. She is well-developed. She is not ill-appearing or diaphoretic.  HENT:     Head: Normocephalic and atraumatic.  Eyes:     General: No scleral icterus.    Conjunctiva/sclera: Conjunctivae normal.     Pupils: Pupils are equal, round, and reactive to light.  Neck:     Thyroid: No thyromegaly.     Vascular: No carotid bruit or JVD.  Cardiovascular:     Rate and Rhythm: Normal rate and regular rhythm.     Heart sounds: Normal heart sounds. No gallop.   Pulmonary:     Effort: Pulmonary effort is normal. No respiratory distress.     Breath sounds: Normal breath sounds. No wheezing or rales.  Abdominal:     General: Bowel sounds are normal. There is no distension or abdominal bruit.     Palpations: Abdomen is soft. There is no mass.     Tenderness: There is no abdominal tenderness.  Musculoskeletal:     Cervical back: Normal range of motion and neck supple.  Lymphadenopathy:     Cervical: No cervical adenopathy.  Skin:    General: Skin is warm and dry.     Findings: No rash.  Neurological:     Mental Status: She is alert.     Cranial Nerves: No cranial nerve deficit.     Coordination: Coordination normal.     Deep Tendon Reflexes: Reflexes are normal and  symmetric.     Comments: Pt  notes that fingers tingle in response to soft touch  Psychiatric:        Mood and Affect: Mood normal.           Assessment & Plan:   Problem List Items Addressed This Visit      Endocrine   Hypothyroidism    Lab Results  Component Value Date   TSH 0.09 (L) 07/02/2020   Pt missed f/u -still on 100 mcg daily of levothyroxine  Taking correctly  Re check TSH now and then advise       Relevant Orders   TSH     Other   Prediabetes    Improved today Lab Results  Component Value Date   HGBA1C 6.3 07/02/2020   Watching sugar Strong fam hx of DM disc imp of low glycemic diet and wt loss to prevent DM2       Tingling in extremities    Low B12 may contribute Will start supplementing this      Vitamin B12 deficiency - Primary    Lab Results  Component Value Date   VITAMINB12 177 (L) 07/02/2020   Shot today  Then one weekly for 3 more weeks  Then check level in 5 wk Handout given  Enc more B12 rich foods  Will consider oral B12 also once we get a level

## 2020-08-07 NOTE — Assessment & Plan Note (Signed)
Improved today Lab Results  Component Value Date   HGBA1C 6.3 07/02/2020   Watching sugar Strong fam hx of DM disc imp of low glycemic diet and wt loss to prevent DM2

## 2020-08-07 NOTE — Assessment & Plan Note (Signed)
Lab Results  Component Value Date   TSH 0.09 (L) 07/02/2020   Pt missed f/u -still on 100 mcg daily of levothyroxine  Taking correctly  Re check TSH now and then advise

## 2020-08-07 NOTE — Patient Instructions (Addendum)
B12 shot today  Then one weekly for 3 more weeks  Then we re check the level in 5 weeks  Then decide how to dose from there   Keep watching sugar intake  You are pre diabetic  So keep watching diet for sugar  Try to get most of your carbohydrates from produce (with the exception of white potatoes)  Eat less bread/pasta/rice/snack foods/cereals/sweets and other items from the middle of the grocery store (processed carbs)   Flu shot today

## 2020-08-08 ENCOUNTER — Telehealth: Payer: Self-pay | Admitting: *Deleted

## 2020-08-08 MED ORDER — LEVOTHYROXINE SODIUM 88 MCG PO TABS: 88.0000 ug | ORAL_TABLET | Freq: Every day | ORAL | 1 refills | Status: DC

## 2020-08-08 NOTE — Telephone Encounter (Signed)
Pt notified of lab results and Dr. Marliss Coots comments Rx sent to pharmacy and lab appt scheduled

## 2020-08-08 NOTE — Telephone Encounter (Signed)
-----   Message from Abner Greenspan, MD sent at 08/07/2020  5:26 PM EST ----- We need to cut the levothyroxine dose from 100 mcg to 88  Please call in #[phone] refills  Re check TSH in 4-6 weeks

## 2020-08-14 ENCOUNTER — Ambulatory Visit (INDEPENDENT_AMBULATORY_CARE_PROVIDER_SITE_OTHER): Payer: Medicare HMO

## 2020-08-14 ENCOUNTER — Other Ambulatory Visit: Payer: Self-pay

## 2020-08-14 DIAGNOSIS — E538 Deficiency of other specified B group vitamins: Secondary | ICD-10-CM | POA: Diagnosis not present

## 2020-08-14 MED ORDER — CYANOCOBALAMIN 1000 MCG/ML IJ SOLN
1000.0000 ug | Freq: Once | INTRAMUSCULAR | Status: AC
  Administered 2020-08-14: 1000 ug via INTRAMUSCULAR

## 2020-08-14 NOTE — Progress Notes (Signed)
Per orders of Dr. Diona Browner, in Dr. Marliss Coots absence, 2nd weekly injection of B12, given by Loreen Freud. Patient tolerated injection well.

## 2020-08-28 ENCOUNTER — Ambulatory Visit (INDEPENDENT_AMBULATORY_CARE_PROVIDER_SITE_OTHER): Payer: Medicare HMO | Admitting: *Deleted

## 2020-08-28 ENCOUNTER — Other Ambulatory Visit: Payer: Self-pay

## 2020-08-28 DIAGNOSIS — E538 Deficiency of other specified B group vitamins: Secondary | ICD-10-CM

## 2020-08-28 MED ORDER — CYANOCOBALAMIN 1000 MCG/ML IJ SOLN
1000.0000 ug | Freq: Once | INTRAMUSCULAR | Status: AC
  Administered 2020-08-28: 1000 ug via INTRAMUSCULAR

## 2020-08-28 NOTE — Progress Notes (Signed)
Per orders of Dr. Tower, injection of Vitamin B12 given by Rosary Filosa Simpson. Patient tolerated injection well.     

## 2020-08-31 ENCOUNTER — Other Ambulatory Visit: Payer: Self-pay | Admitting: Family Medicine

## 2020-09-08 ENCOUNTER — Telehealth: Payer: Self-pay | Admitting: Family Medicine

## 2020-09-08 DIAGNOSIS — E890 Postprocedural hypothyroidism: Secondary | ICD-10-CM

## 2020-09-08 NOTE — Telephone Encounter (Signed)
-----   Message from Alvina Chou sent at 08/25/2020  3:09 PM EST ----- Regarding: Lab orders for Thursday, 1.6.22 1 month lab order

## 2020-09-11 ENCOUNTER — Other Ambulatory Visit (INDEPENDENT_AMBULATORY_CARE_PROVIDER_SITE_OTHER): Payer: Medicare HMO

## 2020-09-11 ENCOUNTER — Ambulatory Visit (INDEPENDENT_AMBULATORY_CARE_PROVIDER_SITE_OTHER): Payer: Medicare HMO

## 2020-09-11 DIAGNOSIS — E538 Deficiency of other specified B group vitamins: Secondary | ICD-10-CM

## 2020-09-11 DIAGNOSIS — E890 Postprocedural hypothyroidism: Secondary | ICD-10-CM

## 2020-09-11 LAB — TSH: TSH: 0.29 u[IU]/mL — ABNORMAL LOW (ref 0.35–4.50)

## 2020-09-11 MED ORDER — CYANOCOBALAMIN 1000 MCG/ML IJ SOLN
1000.0000 ug | Freq: Once | INTRAMUSCULAR | Status: AC
  Administered 2020-09-11: 1000 ug via INTRAMUSCULAR

## 2020-09-11 NOTE — Progress Notes (Signed)
Per orders of Dr. Milinda Antis, injection of B12 given by Consuella Lose. Patient tolerated injection well. Patient will be due for B12 level re check after next injection that is scheduled for 09/25/20.

## 2020-09-12 ENCOUNTER — Other Ambulatory Visit: Payer: Self-pay | Admitting: *Deleted

## 2020-09-12 MED ORDER — LEVOTHYROXINE SODIUM 75 MCG PO TABS: 75.0000 ug | ORAL_TABLET | Freq: Every day | ORAL | 5 refills | Status: DC

## 2020-09-25 ENCOUNTER — Ambulatory Visit (INDEPENDENT_AMBULATORY_CARE_PROVIDER_SITE_OTHER): Payer: Medicare HMO

## 2020-09-25 DIAGNOSIS — E538 Deficiency of other specified B group vitamins: Secondary | ICD-10-CM

## 2020-09-25 MED ORDER — CYANOCOBALAMIN 1000 MCG/ML IJ SOLN
1000.0000 ug | Freq: Once | INTRAMUSCULAR | Status: AC
  Administered 2020-09-25: 1000 ug via INTRAMUSCULAR

## 2020-09-25 NOTE — Progress Notes (Signed)
Per orders of Dr. Glori Bickers, injection of B12 Right Deltoid given by Lurlean Nanny. Patient tolerated injection well.

## 2020-10-02 ENCOUNTER — Other Ambulatory Visit: Payer: Self-pay | Admitting: *Deleted

## 2020-10-02 ENCOUNTER — Other Ambulatory Visit: Payer: Self-pay | Admitting: Family Medicine

## 2020-10-02 MED ORDER — LORAZEPAM 1 MG PO TABS: 1.0000 mg | ORAL_TABLET | Freq: Every evening | ORAL | 0 refills | Status: DC | PRN

## 2020-10-02 NOTE — Telephone Encounter (Signed)
Name of [facility] Name of [facility] Mail order Last Fill or Written Date and Quantity:06/27/20 #90 tabs 0 refills Last Office Visit and Type:08/07/20 f/u Next Office Visit and Type:none scheduled

## 2020-10-20 ENCOUNTER — Other Ambulatory Visit: Payer: Self-pay | Admitting: Family Medicine

## 2020-10-20 NOTE — Telephone Encounter (Signed)
Pharmacy requests refill on: Nortriptyline 75 mg & 25 mg   LAST REFILL: 12/19/2019 (Q-90, R-3) LAST OV: 08/07/2020 NEXT OV: Not Scheduled  PHARMACY: [facility]  Earliest Fill Date: 12/18/2020

## 2020-10-31 ENCOUNTER — Emergency Department
Admission: EM | Admit: 2020-10-31 | Discharge: 2020-10-31 | Disposition: A | Discharge status: Home / Self Care | Payer: Medicare HMO | Attending: Emergency Medicine | Admitting: Emergency Medicine

## 2020-10-31 ENCOUNTER — Other Ambulatory Visit: Payer: Self-pay

## 2020-10-31 DIAGNOSIS — Z1152 Encounter for screening for COVID-19: Secondary | ICD-10-CM

## 2020-10-31 DIAGNOSIS — R110 Nausea: Secondary | ICD-10-CM | POA: Insufficient documentation

## 2020-10-31 DIAGNOSIS — R197 Diarrhea, unspecified: Secondary | ICD-10-CM | POA: Insufficient documentation

## 2020-10-31 DIAGNOSIS — Z79899 Other long term (current) drug therapy: Secondary | ICD-10-CM | POA: Insufficient documentation

## 2020-10-31 DIAGNOSIS — M791 Myalgia, unspecified site: Secondary | ICD-10-CM | POA: Diagnosis not present

## 2020-10-31 DIAGNOSIS — R059 Cough, unspecified: Secondary | ICD-10-CM | POA: Diagnosis present

## 2020-10-31 DIAGNOSIS — Z87891 Personal history of nicotine dependence: Secondary | ICD-10-CM | POA: Insufficient documentation

## 2020-10-31 DIAGNOSIS — J449 Chronic obstructive pulmonary disease, unspecified: Secondary | ICD-10-CM | POA: Diagnosis not present

## 2020-10-31 DIAGNOSIS — I10 Essential (primary) hypertension: Secondary | ICD-10-CM | POA: Diagnosis not present

## 2020-10-31 DIAGNOSIS — J069 Acute upper respiratory infection, unspecified: Secondary | ICD-10-CM

## 2020-10-31 DIAGNOSIS — E039 Hypothyroidism, unspecified: Secondary | ICD-10-CM | POA: Insufficient documentation

## 2020-10-31 DIAGNOSIS — Z20822 Contact with and (suspected) exposure to covid-19: Secondary | ICD-10-CM | POA: Diagnosis not present

## 2020-10-31 DIAGNOSIS — B9789 Other viral agents as the cause of diseases classified elsewhere: Secondary | ICD-10-CM | POA: Diagnosis not present

## 2020-10-31 LAB — SARS CORONAVIRUS 2 (TAT 6-24 HRS): SARS Coronavirus 2: NEGATIVE

## 2020-10-31 LAB — POC SARS CORONAVIRUS 2 AG -  ED: SARS Coronavirus 2 Ag: NEGATIVE

## 2020-10-31 MED ORDER — NORTRIPTYLINE HCL 25 MG PO CAPS: 25.0000 mg | ORAL_CAPSULE | Freq: Every day | ORAL | 0 refills | Status: DC

## 2020-10-31 MED ORDER — BENZONATATE 100 MG PO CAPS: 100.0000 mg | ORAL_CAPSULE | Freq: Three times a day (TID) | ORAL | 0 refills | Status: DC | PRN

## 2020-10-31 MED ORDER — NORTRIPTYLINE HCL 75 MG PO CAPS: 75.0000 mg | ORAL_CAPSULE | Freq: Every day | ORAL | 0 refills | Status: DC

## 2020-10-31 NOTE — Telephone Encounter (Signed)
Will let PCP review to see if she wants to fill early, Rx was refilled for a year on 12/19/19, That's why Anisha, RN denied it (to soon) but according to prev message pt only has 17 pills left

## 2020-10-31 NOTE — ED Notes (Signed)
See triage note  Presents with chest congestion,prod cough and some diarrhea  No fever  sxs' started couple of days ago

## 2020-10-31 NOTE — Discharge Instructions (Addendum)
Follow-up with your primary care provider if any continued problems.  A prescription for Tessalon Perles was sent to your pharmacy to take every 8 hours if needed for cough.  Drink lots of fluids to stay hydrated.  If any worsening of your symptoms return to the emergency department over the weekend.

## 2020-10-31 NOTE — ED Triage Notes (Signed)
Pt ambulatory with a steady gait, no distress noted. Pt c/o cough with green/yellow sputum and HA since Wednesday night, denies fever or body aches.

## 2020-10-31 NOTE — ED Provider Notes (Signed)
[facility] Emergency Department Provider Note   ____________________________________________   Event Date/Time   First MD Initiated Contact with Patient 10/31/20 [id number]     (approximate)  I have reviewed the triage vital signs and the nursing notes.   HISTORY  Chief Complaint URI   HPI [name] [name] is a 75 y.o. female presents to the ED with complaint of productive cough, headache, body aches with some mild nausea, no vomiting but loose stools over the last several days.  Patient is not aware of any fever at home.  She denies any change in taste or smell.  She is unaware of any close exposure to Covid.  Patient reports that she got the Jackson but does not remember what month and has not gotten a booster.       Past Medical History:  Diagnosis Date  . COPD (chronic obstructive pulmonary disease) ([place])   . Depression   . Hyperlipidemia   . Hypertension   . Hypothyroidism   . Osteoarthritis   . Osteoporosis   . Shingles    arm 1/12  . Sleep disorder   . Tobacco abuse    past; quit 09    Patient Active Problem List   Diagnosis Date Noted  . Vitamin B12 deficiency 08/07/2020  . Prediabetes 03/07/2020  . Tingling in extremities 03/07/2020  . Elevated random blood glucose level 02/14/2020  . Neck pain 11/05/2019  . Medicare annual wellness visit, subsequent 10/16/2018  . Skin lesion of face 10/16/2018  . Benign neoplasm of ascending colon   . Benign neoplasm of transverse colon   . Melanosis, colon   . Other specified diseases of esophagus   . Stomach irritation   . Constipation 01/24/2018  . Compression fracture of L1 vertebra ([facility]) 01/24/2018  . Anxiety 01/09/2018  . Coronary artery calcification seen on CAT scan 11/25/2017  . Special screening for malignant neoplasms, colon 11/01/2017  . Estrogen deficiency 09/26/2017  . Aortic calcification ([place]) 11/30/2016  . History of patellar fracture 11/30/2016  . Routine  general medical examination at a health care facility 06/27/2016  . Dyspepsia 06/04/2016  . Loss of weight 06/04/2016  . History of vertebral compression fracture 01/19/2016  . Esophageal dysphagia 06/28/2014  . Chronic foot pain 10/27/2011  . Low back pain 04/14/2011  . FEVER BLISTER 11/17/2010  . Essential hypertension 08/18/2009  . BUNION 03/04/2009  . Allergic rhinitis 01/10/2008  . H/O goiter 02/09/2007  . Hypothyroidism 02/09/2007  . HYPERCHOLESTEROLEMIA 02/09/2007  . History of depression 02/09/2007  . OSTEOARTHRITIS 02/09/2007  . Osteoporosis 02/09/2007  . Disturbance in sleep behavior 02/09/2007    Past Surgical History:  Procedure Laterality Date  . ABDOMINAL HYSTERECTOMY    . BIOPSY THYROID     pos neoplasm, surgery 09/2006  . bunions  06/1998  . COLONOSCOPY WITH PROPOFOL N/A 03/15/2018   Procedure: COLONOSCOPY WITH PROPOFOL;  Surgeon: Virgel Manifold, MD;  Location: [facility] ENDOSCOPY;  Service: Endoscopy;  Laterality: N/A;  . CORONARY ANGIOPLASTY    . CXR-copd, ts partial compression fracture  12/2006  . dexa  12/1996  . ESOPHAGOGASTRODUODENOSCOPY (EGD) WITH PROPOFOL N/A 03/15/2018   Procedure: ESOPHAGOGASTRODUODENOSCOPY (EGD) WITH PROPOFOL;  Surgeon: Virgel Manifold, MD;  Location: [facility] ENDOSCOPY;  Service: Endoscopy;  Laterality: N/A;  . GANGLION CYST EXCISION Right 05/16/2019   Procedure: REMOVAL GANGLION OF WRIST;  Surgeon: Corky Mull, MD;  Location: [facility] ORS;  Service: Orthopedics;  Laterality: Right;  . hashimoto  02/1997  .  hyerectomy- cervical ca cells    . LEFT HEART CATH AND CORONARY ANGIOGRAPHY Left 12/23/2017   Procedure: LEFT HEART CATH AND CORONARY ANGIOGRAPHY;  Surgeon: Minna Merritts, MD;  Location: [facility];  Service: Cardiovascular;  Laterality: Left;  . left wrist fracture     surgery  . right thyroid lobectomy, goiter, thyroiditis  12/2006  . stress fractures foot    . THYROIDECTOMY  11/2009    Prior to Admission medications    Medication Sig Start Date End Date Taking? Authorizing Provider  benzonatate (TESSALON PERLES) 100 MG capsule Take 1 capsule (100 mg total) by mouth 3 (three) times daily as needed for cough. 10/31/20 10/31/21 Yes Stavros Cail L, PA-C  albuterol (PROVENTIL HFA;VENTOLIN HFA) 108 (90 BASE) MCG/ACT inhaler Inhale 2 puffs into the lungs every 4 (four) hours as needed for wheezing. 06/28/14   Tower, Wynelle Fanny, MD  atorvastatin (LIPITOR) 80 MG tablet Take 1 tablet (80 mg total) by mouth daily. 12/28/19   Tower, Wynelle Fanny, MD  Calcium Carbonate-Vit D-Min (CALCIUM 1200 PO) Take 1 tablet by mouth daily.     [provider]  cyclobenzaprine (FLEXERIL) 10 MG tablet TAKE 1 TABLET BY MOUTH EVERYDAY AT BEDTIME 07/23/20   Tower, Wynelle Fanny, MD  doxycycline (VIBRA-TABS) 100 MG tablet  06/09/20   [provider]  ezetimibe (ZETIA) 10 MG tablet Take 1 tablet (10 mg total) by mouth daily. 12/19/19 07/07/20  Tower, Wynelle Fanny, MD  lactulose (CHRONULAC) 10 GM/15ML solution Take 15-30 mL by mouth once daily for 3 days or until you have a bowel movement 01/24/18   Tower, Wynelle Fanny, MD  levothyroxine (SYNTHROID) 75 MCG tablet Take 1 tablet (75 mcg total) by mouth daily. 09/12/20   Tower, Wynelle Fanny, MD  lisinopril (ZESTRIL) 10 MG tablet Take 1 tablet (10 mg total) by mouth daily. 12/28/19   Tower, Wynelle Fanny, MD  loratadine (CLARITIN) 10 MG tablet Take 1 tablet (10 mg total) by mouth daily. 04/26/16   Tonia Ghent, MD  LORazepam (ATIVAN) 1 MG tablet Take 1 tablet (1 mg total) by mouth at bedtime as needed for sleep. 10/02/20   Tower, Wynelle Fanny, MD  nortriptyline (PAMELOR) 25 MG capsule Take 1 capsule (25 mg total) by mouth at bedtime. Along with the 75 mg capsule 12/19/19   Tower, Wynelle Fanny, MD  nortriptyline (PAMELOR) 75 MG capsule Take 1 capsule (75 mg total) by mouth at bedtime. 12/19/19   Tower, Wynelle Fanny, MD  triamcinolone (KENALOG) 0.025 % cream Apply 1 application topically 2 (two) times daily. To affected itchy areas Patient  taking differently: Apply 1 application topically 2 (two) times daily as needed (itchy skin). To affected itchy areas 05/24/18   Tower, Wynelle Fanny, MD    Allergies Fosamax [alendronate sodium] and Raloxifene hcl  Family History  Problem Relation Age of Onset  . Stroke Father   . Hypertension Father   . Diabetes Mother   . Coronary artery disease Mother     Social History Social History   Tobacco Use  . Smoking status: Former Smoker    Packs/day: 0.50    Types: Cigarettes    Quit date: 08/07/2019    Years since quitting: 1.2  . Smokeless tobacco: Never Used  Vaping Use  . Vaping Use: Never used  Substance Use Topics  . Alcohol use: No    Alcohol/week: 0.0 standard drinks  . Drug use: No    Review of Systems Constitutional: No fever/chills Eyes: No  visual changes. ENT: No sore throat. Cardiovascular: Denies chest pain. Respiratory: Denies shortness of breath.  Positive for cough. Gastrointestinal: No abdominal pain.  Positive nausea, no vomiting.  Positive diarrhea.  No constipation. Genitourinary: Negative for dysuria. Musculoskeletal: Negative for back pain. Skin: Negative for rash. Neurological: Negative for headaches, focal weakness or numbness. ____________________________________________   PHYSICAL EXAM:  VITAL SIGNS: ED Triage Vitals [10/31/20 0914]  Enc Vitals Group     BP 131/81     Pulse Rate 98     Resp 16     Temp 98.3 F (36.8 C)     Temp Source Oral     SpO2 98 %     Weight      Height      Head Circumference      Peak Flow      Pain Score      Pain Loc      Pain Edu?      Excl. in [place]?     Constitutional: Alert and oriented. Well appearing and in no acute distress. Eyes: Conjunctivae are normal.  Head: Atraumatic. Nose: Mild congestion/rhinnorhea. Mouth/Throat: Mucous membranes are moist.  Oropharynx non-erythematous. Neck: No stridor.   Cardiovascular: Normal rate, regular rhythm. Grossly normal heart sounds.  Good peripheral  circulation. Respiratory: Normal respiratory effort.  No retractions. Lungs CTAB.  No wheezing or rales noted. Gastrointestinal: Soft and nontender. No distention.  Sounds normoactive x4 quadrants. Musculoskeletal: Moves upper and lower extremities without any difficulty.  No edema noted lower extremities. Neurologic:  Normal speech and language. No gross focal neurologic deficits are appreciated. No gait instability. Skin:  Skin is warm, dry and intact. No rash noted. Psychiatric: Mood and affect are normal. Speech and behavior are normal.  ____________________________________________   LABS (all labs ordered are listed, but only abnormal results are displayed)  Labs Reviewed  SARS CORONAVIRUS 2 (TAT 6-24 HRS)  POC SARS CORONAVIRUS 2 AG -  ED      PROCEDURES  Procedure(s) performed (including Critical Care):  Procedures   ____________________________________________   INITIAL IMPRESSION / ASSESSMENT AND PLAN / ED COURSE  As part of my medical decision making, I reviewed the following data within the electronic MEDICAL RECORD NUMBER Notes from prior ED visits and [place] Controlled Substance Database  75 year old female presents to the ED with complaint of productive cough, headache, nausea without vomiting and some isolated diarrhea.  Patient did get the Covid vaccine [employer].  Symptoms began 2 to 3 days ago.  Patient is unaware of any known Covid exposure.  Currently she denies any nausea.  15-minute rapid Covid test in the ED was negative.  A second test was ordered and patient is aware that her COVID test can be seen on MyChart.  A prescription for Ladona Ridgel was sent to her pharmacy.  She is encouraged to drink fluids to stay hydrated and may take Tylenol if needed for body aches.  If she is positive she is aware that she needs to quarantine.  She is to return to the emergency department if any severe worsening of her symptoms such as difficulty breathing or shortness of  breath. ____________________________________________   FINAL CLINICAL IMPRESSION(S) / ED DIAGNOSES  Final diagnoses:  Viral URI with cough  Encounter for screening for COVID-19     ED Discharge Orders         Ordered    benzonatate (TESSALON PERLES) 100 MG capsule  3 times daily PRN  10/31/20 1240          *Please note:  [name] [name] was evaluated in Emergency Department on 10/31/2020 for the symptoms described in the history of present illness. She was evaluated in the context of the global COVID-19 pandemic, which necessitated consideration that the patient might be at risk for infection with the SARS-CoV-2 virus that causes COVID-19. Institutional protocols and algorithms that pertain to the evaluation of patients at risk for COVID-19 are in a state of rapid change based on information released by regulatory bodies including the CDC and federal and state organizations. These policies and algorithms were followed during the patient's care in the ED.  Some ED evaluations and interventions may be delayed as a result of limited staffing during and the pandemic.*   Note:  This document was prepared using Dragon voice recognition software and may include unintentional dictation errors.    Johnn Hai, PA-C 10/31/20 1601    Lucrezia Starch, MD 10/31/20 [phone]

## 2020-10-31 NOTE — Telephone Encounter (Signed)
Pt said she got a letter from mail order that nortriptyline 25 mg and 75 mg refills were denied; pt said she takes 0ne of each cap each night. Pt has # 17 of Nortriptyline 25 mg that was last filled on 07/29/20; pt has # 0 of Nortriptyline 75 mg that was filled on 07/29/20. Pt said she takes one pill of Nortriptyline 75 mg and 25 mg each night and pt does not know why she has # 17 more of the 25 mg strength. Pt does not have any additional refills and pt wants refills sent to [facility]. Sending note to [place].

## 2020-11-05 ENCOUNTER — Other Ambulatory Visit: Payer: Self-pay

## 2020-11-16 ENCOUNTER — Other Ambulatory Visit: Payer: Self-pay

## 2020-11-16 ENCOUNTER — Emergency Department (HOSPITAL_COMMUNITY): Payer: Medicare HMO

## 2020-11-16 ENCOUNTER — Encounter (HOSPITAL_COMMUNITY): Payer: Self-pay | Admitting: Emergency Medicine

## 2020-11-16 ENCOUNTER — Emergency Department (HOSPITAL_COMMUNITY)
Admission: EM | Admit: 2020-11-16 | Discharge: 2020-11-16 | Disposition: A | Discharge status: Home / Self Care | Payer: Medicare HMO | Attending: Emergency Medicine | Admitting: Emergency Medicine

## 2020-11-16 DIAGNOSIS — S82034A Nondisplaced transverse fracture of right patella, initial encounter for closed fracture: Secondary | ICD-10-CM

## 2020-11-16 DIAGNOSIS — Z87891 Personal history of nicotine dependence: Secondary | ICD-10-CM | POA: Insufficient documentation

## 2020-11-16 DIAGNOSIS — W010XXA Fall on same level from slipping, tripping and stumbling without subsequent striking against object, initial encounter: Secondary | ICD-10-CM | POA: Insufficient documentation

## 2020-11-16 DIAGNOSIS — Z79899 Other long term (current) drug therapy: Secondary | ICD-10-CM | POA: Insufficient documentation

## 2020-11-16 DIAGNOSIS — Y92511 Restaurant or cafe as the place of occurrence of the external cause: Secondary | ICD-10-CM | POA: Insufficient documentation

## 2020-11-16 DIAGNOSIS — I10 Essential (primary) hypertension: Secondary | ICD-10-CM | POA: Diagnosis not present

## 2020-11-16 DIAGNOSIS — J449 Chronic obstructive pulmonary disease, unspecified: Secondary | ICD-10-CM | POA: Insufficient documentation

## 2020-11-16 DIAGNOSIS — S8991XA Unspecified injury of right lower leg, initial encounter: Secondary | ICD-10-CM | POA: Diagnosis not present

## 2020-11-16 DIAGNOSIS — M25461 Effusion, right knee: Secondary | ICD-10-CM | POA: Diagnosis not present

## 2020-11-16 DIAGNOSIS — S82031A Displaced transverse fracture of right patella, initial encounter for closed fracture: Secondary | ICD-10-CM | POA: Diagnosis not present

## 2020-11-16 DIAGNOSIS — E039 Hypothyroidism, unspecified: Secondary | ICD-10-CM | POA: Insufficient documentation

## 2020-11-16 DIAGNOSIS — W19XXXA Unspecified fall, initial encounter: Secondary | ICD-10-CM

## 2020-11-16 MED ORDER — ONDANSETRON 4 MG PO TBDP
4.0000 mg | ORAL_TABLET | Freq: Once | ORAL | Status: AC
  Administered 2020-11-16: 4 mg via ORAL
  Filled 2020-11-16: qty 1

## 2020-11-16 MED ORDER — MORPHINE SULFATE (PF) 4 MG/ML IV SOLN
4.0000 mg | Freq: Once | INTRAVENOUS | Status: AC
  Administered 2020-11-16: 4 mg via INTRAMUSCULAR
  Filled 2020-11-16: qty 1

## 2020-11-16 MED ORDER — HYDROCODONE-ACETAMINOPHEN 5-325 MG PO TABS: 1.0000 | ORAL_TABLET | ORAL | 0 refills | Status: DC | PRN

## 2020-11-16 NOTE — ED Triage Notes (Signed)
Pt slipped and fell in a restaurant 1 hour ago.  C/o pain to R knee.

## 2020-11-16 NOTE — ED Notes (Signed)
Spoke with ortho tech about knee immobilizer, advised second shift tech would apply.

## 2020-11-16 NOTE — ED Provider Notes (Signed)
[facility] Provider Note   CSN: [id number] Arrival date & time: 11/16/20  1722     History Chief Complaint  Patient presents with  . Fall  . Knee Pain    [name] [name] is a 75 y.o. female.  Pt presents to the ED today with right knee pain.  Pt said she slipped and fell at a restaurant pta.  She did not hit anything else.  She has been unable to put any weight on her right leg.        Past Medical History:  Diagnosis Date  . COPD (chronic obstructive pulmonary disease) ([place])   . Depression   . Hyperlipidemia   . Hypertension   . Hypothyroidism   . Osteoarthritis   . Osteoporosis   . Shingles    arm 1/12  . Sleep disorder   . Tobacco abuse    past; quit 09    Patient Active Problem List   Diagnosis Date Noted  . Vitamin B12 deficiency 08/07/2020  . Prediabetes 03/07/2020  . Tingling in extremities 03/07/2020  . Elevated random blood glucose level 02/14/2020  . Neck pain 11/05/2019  . Medicare annual wellness visit, subsequent 10/16/2018  . Skin lesion of face 10/16/2018  . Benign neoplasm of ascending colon   . Benign neoplasm of transverse colon   . Melanosis, colon   . Other specified diseases of esophagus   . Stomach irritation   . Constipation 01/24/2018  . Compression fracture of L1 vertebra ([facility]) 01/24/2018  . Anxiety 01/09/2018  . Coronary artery calcification seen on CAT scan 11/25/2017  . Special screening for malignant neoplasms, colon 11/01/2017  . Estrogen deficiency 09/26/2017  . Aortic calcification ([place]) 11/30/2016  . History of patellar fracture 11/30/2016  . Routine general medical examination at a health care facility 06/27/2016  . Dyspepsia 06/04/2016  . Loss of weight 06/04/2016  . History of vertebral compression fracture 01/19/2016  . Esophageal dysphagia 06/28/2014  . Chronic foot pain 10/27/2011  . Low back pain 04/14/2011  . FEVER BLISTER 11/17/2010  . Essential hypertension  08/18/2009  . BUNION 03/04/2009  . Allergic rhinitis 01/10/2008  . H/O goiter 02/09/2007  . Hypothyroidism 02/09/2007  . HYPERCHOLESTEROLEMIA 02/09/2007  . History of depression 02/09/2007  . OSTEOARTHRITIS 02/09/2007  . Osteoporosis 02/09/2007  . Disturbance in sleep behavior 02/09/2007    Past Surgical History:  Procedure Laterality Date  . ABDOMINAL HYSTERECTOMY    . BIOPSY THYROID     pos neoplasm, surgery 09/2006  . bunions  06/1998  . COLONOSCOPY WITH PROPOFOL N/A 03/15/2018   Procedure: COLONOSCOPY WITH PROPOFOL;  Surgeon: Virgel Manifold, MD;  Location: [facility] ENDOSCOPY;  Service: Endoscopy;  Laterality: N/A;  . CORONARY ANGIOPLASTY    . CXR-copd, ts partial compression fracture  12/2006  . dexa  12/1996  . ESOPHAGOGASTRODUODENOSCOPY (EGD) WITH PROPOFOL N/A 03/15/2018   Procedure: ESOPHAGOGASTRODUODENOSCOPY (EGD) WITH PROPOFOL;  Surgeon: Virgel Manifold, MD;  Location: [facility] ENDOSCOPY;  Service: Endoscopy;  Laterality: N/A;  . GANGLION CYST EXCISION Right 05/16/2019   Procedure: REMOVAL GANGLION OF WRIST;  Surgeon: Corky Mull, MD;  Location: [facility] ORS;  Service: Orthopedics;  Laterality: Right;  . hashimoto  02/1997  . hyerectomy- cervical ca cells    . LEFT HEART CATH AND CORONARY ANGIOGRAPHY Left 12/23/2017   Procedure: LEFT HEART CATH AND CORONARY ANGIOGRAPHY;  Surgeon: Minna Merritts, MD;  Location: [facility];  Service: Cardiovascular;  Laterality: Left;  . left  wrist fracture     surgery  . right thyroid lobectomy, goiter, thyroiditis  12/2006  . stress fractures foot    . THYROIDECTOMY  11/2009     OB History   No obstetric history on file.     Family History  Problem Relation Age of Onset  . Stroke Father   . Hypertension Father   . Diabetes Mother   . Coronary artery disease Mother     Social History   Tobacco Use  . Smoking status: Former Smoker    Packs/day: 0.50    Types: Cigarettes    Quit date: 08/07/2019    Years since  quitting: 1.2  . Smokeless tobacco: Never Used  Vaping Use  . Vaping Use: Never used  Substance Use Topics  . Alcohol use: No    Alcohol/week: 0.0 standard drinks  . Drug use: No    Home Medications Prior to Admission medications   Medication Sig Start Date End Date Taking? Authorizing Provider  HYDROcodone-acetaminophen (NORCO/VICODIN) 5-325 MG tablet Take 1 tablet by mouth every 4 (four) hours as needed. 11/16/20  Yes Isla Pence, MD  albuterol (PROVENTIL HFA;VENTOLIN HFA) 108 (90 BASE) MCG/ACT inhaler Inhale 2 puffs into the lungs every 4 (four) hours as needed for wheezing. 06/28/14   Tower, Wynelle Fanny, MD  atorvastatin (LIPITOR) 80 MG tablet Take 1 tablet (80 mg total) by mouth daily. 12/28/19   Tower, Wynelle Fanny, MD  benzonatate (TESSALON PERLES) 100 MG capsule Take 1 capsule (100 mg total) by mouth 3 (three) times daily as needed for cough. 10/31/20 10/31/21  Johnn Hai, PA-C  Calcium Carbonate-Vit D-Min (CALCIUM 1200 PO) Take 1 tablet by mouth daily.     [provider]  cyclobenzaprine (FLEXERIL) 10 MG tablet TAKE 1 TABLET BY MOUTH EVERYDAY AT BEDTIME 07/23/20   Tower, Wynelle Fanny, MD  doxycycline (VIBRA-TABS) 100 MG tablet  06/09/20   [provider]  ezetimibe (ZETIA) 10 MG tablet Take 1 tablet (10 mg total) by mouth daily. 12/19/19 07/07/20  Tower, Wynelle Fanny, MD  lactulose (CHRONULAC) 10 GM/15ML solution Take 15-30 mL by mouth once daily for 3 days or until you have a bowel movement 01/24/18   Tower, Wynelle Fanny, MD  levothyroxine (SYNTHROID) 75 MCG tablet Take 1 tablet (75 mcg total) by mouth daily. 09/12/20   Tower, Wynelle Fanny, MD  lisinopril (ZESTRIL) 10 MG tablet Take 1 tablet (10 mg total) by mouth daily. 12/28/19   Tower, Wynelle Fanny, MD  loratadine (CLARITIN) 10 MG tablet Take 1 tablet (10 mg total) by mouth daily. 04/26/16   Tonia Ghent, MD  LORazepam (ATIVAN) 1 MG tablet Take 1 tablet (1 mg total) by mouth at bedtime as needed for sleep. 10/02/20   Tower, Wynelle Fanny, MD   nortriptyline (PAMELOR) 25 MG capsule Take 1 capsule (25 mg total) by mouth at bedtime. Along with the 75 mg capsule 10/31/20   Tower, Wynelle Fanny, MD  nortriptyline (PAMELOR) 75 MG capsule Take 1 capsule (75 mg total) by mouth at bedtime. 10/31/20   Tower, Wynelle Fanny, MD  triamcinolone (KENALOG) 0.025 % cream Apply 1 application topically 2 (two) times daily. To affected itchy areas Patient taking differently: Apply 1 application topically 2 (two) times daily as needed (itchy skin). To affected itchy areas 05/24/18   Tower, Wynelle Fanny, MD    Allergies    Fosamax [alendronate sodium] and Raloxifene hcl  Review of Systems   Review of Systems  Musculoskeletal:  Right knee pain  All other systems reviewed and are negative.   Physical Exam Updated Vital Signs BP (!) 161/83 (BP Location: Left Arm)   Pulse 98   Temp 98.3 F (36.8 C)   Resp 16   SpO2 96%   Physical Exam Vitals and nursing note reviewed.  Constitutional:      Appearance: Normal appearance.  HENT:     Head: Normocephalic and atraumatic.     Right Ear: External ear normal.     Left Ear: External ear normal.     Nose: Nose normal.     Mouth/Throat:     Mouth: Mucous membranes are moist.     Pharynx: Oropharynx is clear.  Eyes:     Extraocular Movements: Extraocular movements intact.     Conjunctiva/sclera: Conjunctivae normal.     Pupils: Pupils are equal, round, and reactive to light.  Cardiovascular:     Rate and Rhythm: Normal rate and regular rhythm.     Pulses: Normal pulses.     Heart sounds: Normal heart sounds.  Pulmonary:     Effort: Pulmonary effort is normal.     Breath sounds: Normal breath sounds.  Abdominal:     General: Abdomen is flat. Bowel sounds are normal.     Palpations: Abdomen is soft.  Musculoskeletal:     Cervical back: Normal range of motion and neck supple.     Right knee: Swelling, deformity and bony tenderness present. Decreased range of motion.  Skin:    General: Skin is warm.      Capillary Refill: Capillary refill takes less than 2 seconds.  Neurological:     General: No focal deficit present.     Mental Status: She is alert and oriented to person, place, and time.  Psychiatric:        Mood and Affect: Mood normal.        Behavior: Behavior normal.     ED Results / Procedures / Treatments   Labs (all labs ordered are listed, but only abnormal results are displayed) Labs Reviewed - No data to display  EKG None  Radiology DG Knee Complete 4 Views Right  Result Date: 11/16/2020 CLINICAL DATA:  [place] in the wrist trauma 1 hour ago and injured right knee. EXAM: RIGHT KNEE - COMPLETE 4+ VIEW COMPARISON:  None. FINDINGS: The joint spaces are maintained. No significant degenerative changes. There is a mildly separated transverse fracture of the mid patella. Associated moderate-sized joint effusion. IMPRESSION: Mildly separated transverse fracture of the mid patella. Electronically Signed   By: Marijo Sanes M.D.   On: 11/16/2020 18:00    Procedures Procedures   Medications Ordered in ED Medications  morphine 4 MG/ML injection 4 mg (4 mg Intramuscular Given 11/16/20 1916)  ondansetron (ZOFRAN-ODT) disintegrating tablet 4 mg (4 mg Oral Given 11/16/20 1916)    ED Course  I have reviewed the triage vital signs and the nursing notes.  Pertinent labs & imaging results that were available during my care of the patient were reviewed by me and considered in my medical decision making (see chart for details).  Pt does have a patellar fracture.  She is placed in a knee immobilizer.  She has a walker at home.  She has an orthopedist in [place] (where she lives) that she wishes to f/u with.    MDM Rules/Calculators/A&P  Final Clinical Impression(s) / ED Diagnoses Final diagnoses:  Fall, initial encounter  Closed nondisplaced transverse fracture of right patella, initial encounter    Rx / DC Orders ED Discharge Orders         Ordered     HYDROcodone-acetaminophen (NORCO/VICODIN) 5-325 MG tablet  Every 4 hours PRN        11/16/20 1930           Isla Pence, MD 11/16/20 1930

## 2020-11-17 ENCOUNTER — Telehealth: Payer: Self-pay

## 2020-11-17 DIAGNOSIS — M25561 Pain in right knee: Secondary | ICD-10-CM | POA: Diagnosis not present

## 2020-11-17 DIAGNOSIS — S82031A Displaced transverse fracture of right patella, initial encounter for closed fracture: Secondary | ICD-10-CM | POA: Diagnosis not present

## 2020-11-17 DIAGNOSIS — M8000XS Age-related osteoporosis with current pathological fracture, unspecified site, sequela: Secondary | ICD-10-CM

## 2020-11-17 NOTE — Telephone Encounter (Signed)
Left message for the patient to call back to discuss.

## 2020-11-17 NOTE — Telephone Encounter (Signed)
Checked with [employer]. Patient needs to call her insurance to let them know that she only has one policy and that they are primary. Not able to run benefits until this is done.

## 2020-11-18 ENCOUNTER — Inpatient Hospital Stay: Payer: Medicare HMO | Admitting: Family Medicine

## 2020-11-19 ENCOUNTER — Other Ambulatory Visit: Payer: Self-pay | Admitting: Family Medicine

## 2020-11-19 NOTE — Telephone Encounter (Signed)
Refill request for Flexeril 10 mg tablets  LOV - 08/14/20 Next OV - not scheduled Last refill - 07/23/20 #30/3

## 2020-11-21 NOTE — Telephone Encounter (Signed)
Pt called stating that she needed information about the dose of shot to give insurance company for [place] so that they can approve... I advised pt that she was just supposed to let them know that they are primary insurance and only has 1 policy... they need to make sure it is showing just one policy.Marland KitchenMarland Kitchen

## 2020-11-21 NOTE — Telephone Encounter (Signed)
[name] [name], need to speak with Joycelyn Schmid  with [employer] about this. It does not seem like there is any issues with [employer].

## 2020-11-21 NOTE — Telephone Encounter (Signed)
Pt left v/m ;that pt spoke with [facility] and on their end pt only has one policy.

## 2020-11-21 NOTE — Telephone Encounter (Signed)
Spoke with patient and advised her of the situation with the insurance. She will call and speak with [employer]. Asked patient to let us know when this is done so we can try to re run her benefits.

## 2020-11-24 NOTE — Telephone Encounter (Signed)
Benefit verification re submitted-pending decision Last injection was on 05/21/2020 Next injection after 11/19/2020

## 2020-11-24 NOTE — Telephone Encounter (Signed)
Per call we need to have resubmitted

## 2020-11-26 DIAGNOSIS — S82031S Displaced transverse fracture of right patella, sequela: Secondary | ICD-10-CM | POA: Diagnosis not present

## 2020-11-26 DIAGNOSIS — M25561 Pain in right knee: Secondary | ICD-10-CM | POA: Diagnosis not present

## 2020-12-10 DIAGNOSIS — S82031D Displaced transverse fracture of right patella, subsequent encounter for closed fracture with routine healing: Secondary | ICD-10-CM | POA: Diagnosis not present

## 2020-12-25 NOTE — Telephone Encounter (Signed)
Benefits received. Spoke with patient today letting her know that she would owe [facility] $280. Patient is recovering from a fracture and needs to check on transportation. Advised patient I would follow up with her again next week to schedule. PA done and approved for dates 01/01/21-09/05/21-Auth # [id number]

## 2020-12-27 ENCOUNTER — Other Ambulatory Visit: Payer: Self-pay | Admitting: Family Medicine

## 2020-12-29 NOTE — Telephone Encounter (Signed)
Name of Santiago Name of [facility] Mail order Last Fill or Written Date and Quantity:10/02/20#90 tabs 0 refills Last Office Visit and Type:08/07/20 f/u Next Office Visit and Type:none scheduled  Other 3 meds also due for refill

## 2020-12-31 DIAGNOSIS — S82031D Displaced transverse fracture of right patella, subsequent encounter for closed fracture with routine healing: Secondary | ICD-10-CM | POA: Diagnosis not present

## 2021-01-01 NOTE — Addendum Note (Signed)
Addended by: Kris Mouton on: 01/01/2021 04:20 PM   Modules accepted: Orders

## 2021-01-01 NOTE — Telephone Encounter (Signed)
Spoke with patient. Patient is scheduled for lab appt on 01/05/21 and NV 01/13/21

## 2021-01-02 NOTE — Telephone Encounter (Signed)
Noted  

## 2021-01-02 NOTE — Telephone Encounter (Signed)
Thanks for the heads up, please go ahead with prolia

## 2021-01-05 ENCOUNTER — Other Ambulatory Visit: Payer: Self-pay

## 2021-01-05 ENCOUNTER — Other Ambulatory Visit (INDEPENDENT_AMBULATORY_CARE_PROVIDER_SITE_OTHER): Payer: Medicare HMO

## 2021-01-05 DIAGNOSIS — M8000XS Age-related osteoporosis with current pathological fracture, unspecified site, sequela: Secondary | ICD-10-CM | POA: Diagnosis not present

## 2021-01-05 DIAGNOSIS — M8000XA Age-related osteoporosis with current pathological fracture, unspecified site, initial encounter for fracture: Secondary | ICD-10-CM

## 2021-01-05 LAB — BASIC METABOLIC PANEL
BUN: 13 mg/dL (ref 6–23)
CO2: 29 mEq/L (ref 19–32)
Calcium: 9.1 mg/dL (ref 8.4–10.5)
Chloride: 105 mEq/L (ref 96–112)
Creatinine, Ser: 0.8 mg/dL (ref 0.40–1.20)
GFR: 72.13 mL/min (ref >60.00)
Glucose, Bld: 96 mg/dL (ref 70–99)
Potassium: 4 mEq/L (ref 3.5–5.1)
Sodium: 140 mEq/L (ref 135–145)

## 2021-01-06 NOTE — Telephone Encounter (Signed)
Are you talking about GFR? Is 72 -above 60

## 2021-01-06 NOTE — Telephone Encounter (Signed)
Noted. Notes updated in the appt desk

## 2021-01-06 NOTE — Telephone Encounter (Signed)
Creatinine calculation  Is 47.86 mL/min-under the 39ml/min that is recommended. OK to proceed with injection? Calcium was normal at 9.1 on 01/05/21

## 2021-01-06 NOTE — Telephone Encounter (Signed)
No not the GFR level, this is the Creatinine Clearance Calculation that we have to use per [employer] rep, and patient's level is lower than 60, and when level calculates at lower than 60 we have to get clearance from the provider if patient should proceed with the injection and if any labs are necessary after the injection.

## 2021-01-06 NOTE — Telephone Encounter (Signed)
Thanks , aware Please proceed with Prolia injection

## 2021-01-13 ENCOUNTER — Ambulatory Visit (INDEPENDENT_AMBULATORY_CARE_PROVIDER_SITE_OTHER): Payer: Medicare HMO

## 2021-01-13 ENCOUNTER — Other Ambulatory Visit: Payer: Self-pay

## 2021-01-13 DIAGNOSIS — M810 Age-related osteoporosis without current pathological fracture: Secondary | ICD-10-CM | POA: Diagnosis not present

## 2021-01-13 DIAGNOSIS — M8000XS Age-related osteoporosis with current pathological fracture, unspecified site, sequela: Secondary | ICD-10-CM

## 2021-01-13 MED ORDER — DENOSUMAB 60 MG/ML ~~LOC~~ SOSY
60.0000 mg | PREFILLED_SYRINGE | Freq: Once | SUBCUTANEOUS | Status: AC
  Administered 2021-01-13: 60 mg via SUBCUTANEOUS

## 2021-01-13 NOTE — Progress Notes (Signed)
Per orders of Dr. Tower, injection of Prolia given by Marquist Binstock P Berthe Oley, CMA in Left Arm. Patient tolerated injection well.  

## 2021-01-27 ENCOUNTER — Other Ambulatory Visit: Payer: Self-pay | Admitting: Family Medicine

## 2021-01-28 DIAGNOSIS — S82031D Displaced transverse fracture of right patella, subsequent encounter for closed fracture with routine healing: Secondary | ICD-10-CM | POA: Diagnosis not present

## 2021-02-03 ENCOUNTER — Other Ambulatory Visit: Payer: Self-pay

## 2021-02-03 MED ORDER — CYCLOBENZAPRINE HCL 10 MG PO TABS: ORAL_TABLET | ORAL | 0 refills | Status: DC

## 2021-02-03 MED ORDER — LISINOPRIL 10 MG PO TABS: 10.0000 mg | ORAL_TABLET | Freq: Every day | ORAL | 3 refills | Status: DC

## 2021-02-03 MED ORDER — LEVOTHYROXINE SODIUM 75 MCG PO TABS: 75.0000 ug | ORAL_TABLET | Freq: Every day | ORAL | 0 refills | Status: DC

## 2021-02-03 MED ORDER — ATORVASTATIN CALCIUM 80 MG PO TABS: 80.0000 mg | ORAL_TABLET | Freq: Every day | ORAL | 3 refills | Status: DC

## 2021-02-03 NOTE — Telephone Encounter (Signed)
Pt left v/m returning a call. Sending note to [place].

## 2021-02-03 NOTE — Telephone Encounter (Signed)
Please schedule labs (aware message left) Refills done thanks

## 2021-02-03 NOTE — Telephone Encounter (Signed)
Received refill request from [facility]. Levothyroxine 75 mg- this dose was started on 09/11/20 and patient was suppose to come back to recheck her levels in 6 weeks from then but this was not done.   I called and left message for the patient to call back to be scheduled for lab visit before refilling.  Cyclobenzaprine- last refilled on 11/19/20 # 30 with 5 refills but was sent to [facility]-request now to be sent to [facility] instead. LOV 08/07/20 follow up visit.  No future appointments with Dr Glori Bickers scheduled.  Will send to Dr Glori Bickers for review

## 2021-02-11 ENCOUNTER — Telehealth: Payer: Self-pay

## 2021-02-11 ENCOUNTER — Telehealth: Payer: Self-pay | Admitting: Family Medicine

## 2021-02-11 NOTE — Telephone Encounter (Signed)
Pt received cyclobenzaprine in mail and pt wanted to know what the med was used for. Advised is a muscle relaxant;per med list take cyclobenzaprine 10 mg (one tab) po at hs prn. Pt voiced understanding and appreciative.nothing further needed at this time.

## 2021-02-11 NOTE — Chronic Care Management (AMB) (Signed)
  Chronic Care Management   Outreach Note  02/11/2021 Name: [name] [name] MRN: [id number] DOB: 1946/04/03  Referred by: Tower, Wynelle Fanny, MD Reason for referral : No chief complaint on file.   An unsuccessful telephone outreach was attempted today. The patient was referred to the pharmacist for assistance with care management and care coordination.   Follow Up Plan:   Tatjana Dellinger Upstream Scheduler

## 2021-02-12 ENCOUNTER — Telehealth: Payer: Self-pay | Admitting: Family Medicine

## 2021-02-12 NOTE — Chronic Care Management (AMB) (Signed)
  Chronic Care Management   Note  02/12/2021 Name: [name] [name] MRN: [id number] DOB: Apr 14, 1946  [name] [name] is a 75 y.o. year old female who is a primary care patient of Tower, Wynelle Fanny, MD. I reached out to [employer] by phone today in response to a referral sent by Ms. Laelle L Cerney's PCP, Tower, Wynelle Fanny, MD.   Ms. Yono was given information about Chronic Care Management services today including:  CCM service includes personalized support from designated clinical staff supervised by her physician, including individualized plan of care and coordination with other care providers 24/7 contact phone numbers for assistance for urgent and routine care needs. Service will only be billed when office clinical staff spend 20 minutes or more in a month to coordinate care. Only one practitioner may furnish and bill the service in a calendar month. The patient may stop CCM services at any time (effective at the [name] of the month) by phone call to the office staff.   Patient agreed to services and verbal consent obtained.   Follow up plan:  Tatjana Secretary/administrator

## 2021-02-16 ENCOUNTER — Other Ambulatory Visit: Payer: Self-pay

## 2021-02-16 ENCOUNTER — Ambulatory Visit (INDEPENDENT_AMBULATORY_CARE_PROVIDER_SITE_OTHER): Payer: Medicare HMO

## 2021-02-16 DIAGNOSIS — Z0000 Encounter for general adult medical examination without abnormal findings: Secondary | ICD-10-CM | POA: Diagnosis not present

## 2021-02-16 NOTE — Progress Notes (Signed)
PCP notes:  Health Maintenance: COVID- declined Mammogram- declined Dexa- declined Shingrix- due   Abnormal Screenings: none   Patient concerns: none   Nurse concerns: none   Next PCP appt.: none

## 2021-02-16 NOTE — Progress Notes (Signed)
Subjective:   [name] [name] is a 75 y.o. female who presents for Medicare Annual (Subsequent) preventive examination.  Review of Systems: N/A     I connected with the patient today by telephone and verified that I am speaking with the correct person using two identifiers. Location patient: home Location nurse: work Persons participating in the telephone visit: patient, nurse.   I discussed the limitations, risks, security and privacy concerns of performing an evaluation and management service by telephone and the availability of in person appointments. I also discussed with the patient that there may be a patient responsible charge related to this service. The patient expressed understanding and verbally consented to this telephonic visit.        Cardiac Risk Factors include: advanced age (>23men, >31 women);hypertension;Other (see comment), Risk factor comments: hypercholesterolemia     Objective:    Today's Vitals   There is no height or weight on file to calculate BMI.  Advanced Directives 02/16/2021 10/31/2020 12/12/2019 05/16/2019 05/11/2019 12/27/2018 12/26/2018  Does Patient Have a Medical Advance Directive? No No Yes No No No No  Type of Advance Directive - Public librarian;Living will - - - -  Copy of [place] in Chart? - - No - copy requested - - - -  Would patient like information on creating a medical advance directive? No - Patient declined No - Patient declined - No - Patient declined No - Patient declined No - Patient declined No - Patient declined    Current Medications (verified) Outpatient Encounter Medications as of 02/16/2021  Medication Sig   albuterol (PROVENTIL HFA;VENTOLIN HFA) 108 (90 BASE) MCG/ACT inhaler Inhale 2 puffs into the lungs every 4 (four) hours as needed for wheezing.   atorvastatin (LIPITOR) 80 MG tablet Take 1 tablet (80 mg total) by mouth daily.   benzonatate (TESSALON PERLES) 100 MG capsule Take 1 capsule (100 mg  total) by mouth 3 (three) times daily as needed for cough.   Calcium Carbonate-Vit D-Min (CALCIUM 1200 PO) Take 1 tablet by mouth daily.    cyclobenzaprine (FLEXERIL) 10 MG tablet TAKE 1 TABLET BY MOUTH EVERYDAY AT BEDTIME AS NEEDED   doxycycline (VIBRA-TABS) 100 MG tablet    ezetimibe (ZETIA) 10 MG tablet TAKE 1 TABLET EVERY DAY   HYDROcodone-acetaminophen (NORCO/VICODIN) 5-325 MG tablet Take 1 tablet by mouth every 4 (four) hours as needed.   lactulose (CHRONULAC) 10 GM/15ML solution Take 15-30 mL by mouth once daily for 3 days or until you have a bowel movement   levothyroxine (SYNTHROID) 75 MCG tablet Take 1 tablet (75 mcg total) by mouth daily.   lisinopril (ZESTRIL) 10 MG tablet Take 1 tablet (10 mg total) by mouth daily.   loratadine (CLARITIN) 10 MG tablet Take 1 tablet (10 mg total) by mouth daily.   LORazepam (ATIVAN) 1 MG tablet TAKE 1 TABLET (1 MG TOTAL) BY MOUTH AT BEDTIME AS NEEDED FOR SLEEP.   nortriptyline (PAMELOR) 25 MG capsule TAKE 1 CAPSULE (25 MG TOTAL) BY MOUTH AT BEDTIME. ALONG WITH THE 75 MG CAPSULE   nortriptyline (PAMELOR) 75 MG capsule TAKE 1 CAPSULE BY MOUTH AT BEDTIME.   triamcinolone (KENALOG) 0.025 % cream Apply 1 application topically 2 (two) times daily. To affected itchy areas (Patient taking differently: Apply 1 application topically 2 (two) times daily as needed (itchy skin). To affected itchy areas)   No facility-administered encounter medications on file as of 02/16/2021.    Allergies (verified) Fosamax [alendronate sodium] and  Raloxifene hcl   History: Past Medical History:  Diagnosis Date   COPD (chronic obstructive pulmonary disease) ([place])    Depression    Hyperlipidemia    Hypertension    Hypothyroidism    Osteoarthritis    Osteoporosis    Shingles    arm 1/12   Sleep disorder    Tobacco abuse    past; quit 09   Past Surgical History:  Procedure Laterality Date   ABDOMINAL HYSTERECTOMY     BIOPSY THYROID     pos neoplasm, surgery  09/2006   bunions  06/1998   COLONOSCOPY WITH PROPOFOL N/A 03/15/2018   Procedure: COLONOSCOPY WITH PROPOFOL;  Surgeon: Virgel Manifold, MD;  Location: [facility] END[facility]COPY;  Service: Endoscopy;  Laterality: N/A;   CORONARY ANGIOPLASTY     CXR-copd, ts partial compression fracture  12/2006   dexa  12/1996   ESOPHAGOGASTRODUODENOSCOPY (EGD) WITH PROPOFOL N/A 03/15/2018   Procedure: ESOPHAGOGASTRODUODENOSCOPY (EGD) WITH PROPOFOL;  Surgeon: Virgel Manifold, MD;  Location: [facility] ENDOSCOPY;  Service: Endoscopy;  Laterality: N/A;   GANGLION CYST EXCISION Right 05/16/2019   Procedure: REMOVAL GANGLION OF WRIST;  Surgeon: Corky Mull, MD;  Location: [facility] ORS;  Service: Orthopedics;  Laterality: Right;   hashimoto  02/1997   hyerectomy- cervical ca cells     LEFT HEART CATH AND CORONARY ANGIOGRAPHY Left 12/23/2017   Procedure: LEFT HEART CATH AND CORONARY ANGIOGRAPHY;  Surgeon: Minna Merritts, MD;  Location: [facility];  Service: Cardiovascular;  Laterality: Left;   left wrist fracture     surgery   right thyroid lobectomy, goiter, thyroiditis  12/2006   stress fractures foot     THYROIDECTOMY  11/2009   Family History  Problem Relation Age of Onset   Stroke Father    Hypertension Father    Diabetes Mother    Coronary artery disease Mother    Social History   Socioeconomic History   Marital status: Widowed    Spouse name: Not on file   Number of children: Not on file   Years of education: Not on file   Highest education level: Not on file  Occupational History   Not on file  Tobacco Use   Smoking status: Former    Packs/day: 0.50    Pack years: 0.00    Types: Cigarettes    Quit date: 08/07/2019    Years since quitting: 1.5   Smokeless tobacco: Never  Vaping Use   Vaping Use: Never used  Substance and Sexual Activity   Alcohol use: No    Alcohol/week: 0.0 standard drinks   Drug use: No   Sexual activity: Not on file  Other Topics Concern   Not on file  Social  History Narrative   Marital Status: widowed.    3 children   Lost husband to throat cancer in 8/16 .    Social Determinants of Health   Financial Resource Strain: Low Risk    Difficulty of Paying Living Expenses: Not hard at all  Food Insecurity: No Food Insecurity   Worried About Charity fundraiser in the Last Year: Never true   [place] in the Last Year: Never true  Transportation Needs: No Transportation Needs   Lack of Transportation (Medical): No   Lack of Transportation (Non-Medical): No  Physical Activity: Inactive   Days of Exercise per Week: 0 days   Minutes of Exercise per Session: 0 min  Stress: No Stress Concern Present   Feeling of  Stress : Not at all  Social Connections: Not on file    Tobacco Counseling Counseling given: Not Answered   Clinical Intake:  Pre-visit preparation completed: Yes  Pain : 0-10 Pain Type: Chronic pain Pain Location: Knee Pain Orientation: Right Pain Descriptors / Indicators: Aching Pain Onset: More than a month ago Pain Frequency: Intermittent     Nutritional Risks: None Diabetes: No  How often do you need to have someone help you when you read instructions, pamphlets, or other written materials from your doctor or pharmacy?: 1 - Never What is the last grade level you completed in school?: 9th  Diabetic: No Nutrition Risk Assessment:  Has the patient had any N/V/D within the last 2 months?  No  Does the patient have any non-healing wounds?  No  Has the patient had any unintentional weight loss or weight gain?  No   Diabetes:  Is the patient diabetic?  No  If diabetic, was a CBG obtained today?   N/A Did the patient bring in their glucometer from home?   N/A How often do you monitor your CBG's? N/A.   Financial Strains and Diabetes Management:  Are you having any financial strains with the device, your supplies or your medication?  N/A .  Does the patient want to be seen by Chronic Care Management for  management of their diabetes?   N/A Would the patient like to be referred to a Nutritionist or for Diabetic Management?   N/A   Interpreter Needed?: No  Information entered by :: CJohnson, LPN   Activities of Daily Living In your present state of health, do you have any difficulty performing the following activities: 02/16/2021  Hearing? N  Vision? N  Difficulty concentrating or making decisions? N  Walking or climbing stairs? N  Dressing or bathing? N  Doing errands, shopping? N  Preparing Food and eating ? N  Using the Toilet? N  In the past six months, have you accidently leaked urine? N  Do you have problems with loss of bowel control? N  Managing your Medications? N  Managing your Finances? N  Housekeeping or managing your Housekeeping? N  Some recent data might be hidden    Patient Care Team: Tower, Wynelle Fanny, MD as PCP - General Kerin Ransom Stephannie Li, OD as Consulting Physician (Optometry) Debbora Dus, [facility] as Pharmacist (Pharmacist)  Indicate any recent Medical Services you may have received from other than [facility] providers in the past year (date may be approximate).     Assessment:   This is a routine wellness examination for Kaneisha.  Hearing/Vision screen Vision Screening - Comments:: Patient gets annual eye exams   Dietary issues and exercise activities discussed: Current Exercise Habits: Home exercise routine, Type of exercise: stretching, Time (Minutes): 30, Frequency (Times/Week): 7, Weekly Exercise (Minutes/Week): 210, Intensity: Mild, Exercise limited by: None identified   Goals Addressed             This Visit's Progress    Patient Stated       02/16/2021, I will continue to do my knee exercises daily for 30 minutes.         Depression Screen PHQ 2/9 Scores 02/16/2021 12/12/2019 11/05/2019 10/16/2018 09/22/2017 07/02/2016 01/02/2014  PHQ - 2 Score 6 1 6  0 0 0 0  PHQ- 9 Score 12 1 13  - 0 - -    Fall Risk Fall Risk  02/16/2021 12/12/2019 10/16/2018 09/22/2017  07/02/2016  Falls in the past year? 1 0 1 Yes No  Comment - - - fall with fracture of left knee -  Number falls in past yr: 0 0 1 1 -  Injury with Fall? 1 0 0 Yes -  Risk for fall due to : Medication side effect Medication side effect - - -  Follow up Falls evaluation completed;Falls prevention discussed Falls evaluation completed;Falls prevention discussed - - -    FALL RISK PREVENTION PERTAINING TO THE HOME:  Any stairs in or around the home? Yes  If so, are there any without handrails? No  Home free of loose throw rugs in walkways, pet beds, electrical cords, etc? Yes  Adequate lighting in your home to reduce risk of falls? Yes   ASSISTIVE DEVICES UTILIZED TO PREVENT FALLS:  Life alert? No  Use of a cane, walker or w/c? Yes  Grab bars in the bathroom? No  Shower chair or bench in shower? Yes  Elevated toilet seat or a handicapped toilet? No   TIMED UP AND GO:  Was the test performed?  N/A telephone visit .   Cognitive Function: MMSE - Mini Mental State Exam 02/16/2021 12/12/2019 09/22/2017 07/02/2016  Orientation to time 5 5 5 5   Orientation to Place 5 5 5 5   Registration 3 3 3 3   Attention/ Calculation 5 5 0 0  Recall 3 3 3 3   Language- name 2 objects - - 0 0  Language- repeat 1 1 1 1   Language- follow 3 step command - - 2 0  Language- follow 3 step command-comments - - unable to follow 1 step of 3 step command pt was unable to follow 3 steps of 3 step command  Language- read & follow direction - - 0 0  Write a sentence - - 0 0  Copy design - - 0 0  Total score - - 19 17  Mini Cog  Mini-Cog screen was completed. Maximum score is 22. A value of 0 denotes this part of the MMSE was not completed or the patient failed this part of the Mini-Cog screening.       Immunizations Immunization History  Administered Date(s) Administered   Fluad Quad(high Dose 65+) 08/07/2020   Influenza Split 05/24/2012   Influenza Whole 06/16/2007   Influenza,inj,Quad PF,6+ Mos  07/04/2013, 06/28/2014, 07/25/2015, 06/04/2016, 08/31/2017, 10/16/2018   Pneumococcal Conjugate-13 08/08/2015   Pneumococcal Polysaccharide-23 11/10/2007, 06/28/2014   Td 11/10/2007    TDAP status: Due, Education has been provided regarding the importance of this vaccine. Advised may receive this vaccine at local pharmacy or Health Dept. Aware to provide a copy of the vaccination record if obtained from local pharmacy or Health Dept. Verbalized acceptance and understanding.  Flu Vaccine status: Up to date  Pneumococcal vaccine status: Up to date  Covid-19 vaccine status: Declined, Education has been provided regarding the importance of this vaccine but patient still declined. Advised may receive this vaccine at local pharmacy or Health Dept.or vaccine clinic. Aware to provide a copy of the vaccination record if obtained from local pharmacy or Health Dept. Verbalized acceptance and understanding.  Qualifies for Shingles Vaccine? Yes   Zostavax completed No   Shingrix Completed?: Yes  Screening Tests Health Maintenance  Topic Date Due   COVID-19 Vaccine (1) Never done   Zoster Vaccines- Shingrix (1 of 2) Never done   TETANUS/TDAP  10/16/2028 (Originally 11/09/2017)   INFLUENZA VACCINE  04/06/2021   COLONOSCOPY (Pts 45-4yrs Insurance coverage will need to be confirmed)  03/15/2028   DEXA SCAN  Completed   Hepatitis C  Screening  Completed   PNA vac Low Risk Adult  Completed   HPV VACCINES  Aged Out    Health Maintenance  Health Maintenance Due  Topic Date Due   COVID-19 Vaccine (1) Never done   Zoster Vaccines- Shingrix (1 of 2) Never done    Colorectal cancer screening: Type of screening: Colonoscopy. Completed 03/15/2018. Repeat every 10 years  Mammogram status: declined  Bone Density status: declined  Lung Cancer Screening: (Low Dose CT Chest recommended if Age 14-80 years, 30 pack-year currently smoking OR have quit w/in 15years.) does not qualify.   Additional  Screening:  Hepatitis C Screening: does qualify; Completed 07/02/2016  Vision Screening: Recommended annual ophthalmology exams for early detection of glaucoma and other disorders of the eye. Is the patient up to date with their annual eye exam?  Yes  Who is the provider or what is the name of the office in which the patient attends annual eye exams? Eye doctor at [facility]  If pt is not established with a provider, would they like to be referred to a provider to establish care? No .   Dental Screening: Recommended annual dental exams for proper oral hygiene  Community Resource Referral / Chronic Care Management: CRR required this visit?  No   CCM required this visit?  No      Plan:     I have personally reviewed and noted the following in the patient's chart:   Medical and social history Use of alcohol, tobacco or illicit drugs  Current medications and supplements including opioid prescriptions.  Functional ability and status Nutritional status Physical activity Advanced directives List of other physicians Hospitalizations, surgeries, and ER visits in previous 12 months Vitals Screenings to include cognitive, depression, and falls Referrals and appointments  In addition, I have reviewed and discussed with patient certain preventive protocols, quality metrics, and best practice recommendations. A written personalized care plan for preventive services as well as general preventive health recommendations were provided to patient.   Due to this being a telephonic visit, the after visit summary with patients personalized plan was offered to patient via office or my-chart. Patient preferred to pick up at office at next visit.   Andrez Grime, LPN   [id number]

## 2021-02-16 NOTE — Patient Instructions (Signed)
[name] [name] , Thank you for taking time to come for your Medicare Wellness Visit. I appreciate your ongoing commitment to your health goals. Please review the following plan we discussed and let me know if I can assist you in the future.   Screening recommendations/referrals: Colonoscopy: Up to date, completed 03/15/2018, due 03/2028 Mammogram: declined Bone Density: declined Recommended yearly ophthalmology/optometry visit for glaucoma screening and checkup Recommended yearly dental visit for hygiene and checkup  Vaccinations: Influenza vaccine: Up to date, completed 08/07/2020, due 04/2021 Pneumococcal vaccine: Completed series Tdap vaccine: decline-insurance Shingles vaccine: due, check with your insurance regarding coverage if interested    Covid-19:declined  Advanced directives: Advance directive discussed with you today. Even though you declined this today please call our office should you change your mind and we can give you the proper paperwork for you to fill out.  Conditions/risks identified: hypertension, hypercholesterolemia   Next appointment: Follow up in one year for your annual wellness visit    Preventive Care 20 Years and Older, Female Preventive care refers to lifestyle choices and visits with your health care provider that can promote health and wellness. What does preventive care include? A yearly physical exam. This is also called an annual well check. Dental exams once or twice a year. Routine eye exams. Ask your health care provider how often you should have your eyes checked. Personal lifestyle choices, including: Daily care of your teeth and gums. Regular physical activity. Eating a healthy diet. Avoiding tobacco and drug use. Limiting alcohol use. Practicing safe sex. Taking low-dose aspirin every day. Taking vitamin and mineral supplements as recommended by your health care provider. What happens during an annual well check? The services and screenings  done by your health care provider during your annual well check will depend on your age, overall health, lifestyle risk factors, and family history of disease. Counseling  Your health care provider may ask you questions about your: Alcohol use. Tobacco use. Drug use. Emotional well-being. Home and relationship well-being. Sexual activity. Eating habits. History of falls. Memory and ability to understand (cognition). Work and work Statistician. Reproductive health. Screening  You may have the following tests or measurements: Height, weight, and BMI. Blood pressure. Lipid and cholesterol levels. These may be checked every 5 years, or more frequently if you are over 53 years old. Skin check. Lung cancer screening. You may have this screening every year starting at age 31 if you have a 30-pack-year history of smoking and currently smoke or have quit within the past 15 years. Fecal occult blood test (FOBT) of the stool. You may have this test every year starting at age 48. Flexible sigmoidoscopy or colonoscopy. You may have a sigmoidoscopy every 5 years or a colonoscopy every 10 years starting at age 17. Hepatitis C blood test. Hepatitis B blood test. Sexually transmitted disease (STD) testing. Diabetes screening. This is done by checking your blood sugar (glucose) after you have not eaten for a while (fasting). You may have this done every 1-3 years. Bone density scan. This is done to screen for osteoporosis. You may have this done starting at age 89. Mammogram. This may be done every 1-2 years. Talk to your health care provider about how often you should have regular mammograms. Talk with your health care provider about your test results, treatment options, and if necessary, the need for more tests. Vaccines  Your health care provider may recommend certain vaccines, such as: Influenza vaccine. This is recommended every year. Tetanus, diphtheria, and acellular  pertussis (Tdap, Td)  vaccine. You may need a Td booster every 10 years. Zoster vaccine. You may need this after age 67. Pneumococcal 13-valent conjugate (PCV13) vaccine. One dose is recommended after age 69. Pneumococcal polysaccharide (PPSV23) vaccine. One dose is recommended after age 37. Talk to your health care provider about which screenings and vaccines you need and how often you need them. This information is not intended to replace advice given to you by your health care provider. Make sure you discuss any questions you have with your health care provider. Document Released: 09/19/2015 Document Revised: 05/12/2016 Document Reviewed: 06/24/2015 Elsevier Interactive Patient Education  2017 [employer] Prevention in the Home Falls can cause injuries. They can happen to people of all ages. There are many things you can do to make your home safe and to help prevent falls. What can I do on the outside of my home? Regularly fix the edges of walkways and driveways and fix any cracks. Remove anything that might make you trip as you walk through a door, such as a raised step or threshold. Trim any bushes or trees on the path to your home. Use bright outdoor lighting. Clear any walking paths of anything that might make someone trip, such as rocks or tools. Regularly check to see if handrails are loose or broken. Make sure that both sides of any steps have handrails. Any raised decks and porches should have guardrails on the edges. Have any leaves, snow, or ice cleared regularly. Use sand or salt on walking paths during winter. Clean up any spills in your garage right away. This includes oil or grease spills. What can I do in the bathroom? Use night lights. Install grab bars by the toilet and in the tub and shower. Do not use towel bars as grab bars. Use non-skid mats or decals in the tub or shower. If you need to sit down in the shower, use a plastic, non-slip stool. Keep the floor dry. Clean up any  water that spills on the floor as soon as it happens. Remove soap buildup in the tub or shower regularly. Attach bath mats securely with double-sided non-slip rug tape. Do not have throw rugs and other things on the floor that can make you trip. What can I do in the bedroom? Use night lights. Make sure that you have a light by your bed that is easy to reach. Do not use any sheets or blankets that are too big for your bed. They should not hang down onto the floor. Have a firm chair that has side arms. You can use this for support while you get dressed. Do not have throw rugs and other things on the floor that can make you trip. What can I do in the kitchen? Clean up any spills right away. Avoid walking on wet floors. Keep items that you use a lot in easy-to-reach places. If you need to reach something above you, use a strong step stool that has a grab bar. Keep electrical cords out of the way. Do not use floor polish or wax that makes floors slippery. If you must use wax, use non-skid floor wax. Do not have throw rugs and other things on the floor that can make you trip. What can I do with my stairs? Do not leave any items on the stairs. Make sure that there are handrails on both sides of the stairs and use them. Fix handrails that are broken or loose. Make sure that handrails  are as long as the stairways. Check any carpeting to make sure that it is firmly attached to the stairs. Fix any carpet that is loose or worn. Avoid having throw rugs at the top or bottom of the stairs. If you do have throw rugs, attach them to the floor with carpet tape. Make sure that you have a light switch at the top of the stairs and the bottom of the stairs. If you do not have them, ask someone to add them for you. What else can I do to help prevent falls? Wear shoes that: Do not have high heels. Have rubber bottoms. Are comfortable and fit you well. Are closed at the toe. Do not wear sandals. If you use a  stepladder: Make sure that it is fully opened. Do not climb a closed stepladder. Make sure that both sides of the stepladder are locked into place. Ask someone to hold it for you, if possible. Clearly mark and make sure that you can see: Any grab bars or handrails. First and last steps. Where the edge of each step is. Use tools that help you move around (mobility aids) if they are needed. These include: Canes. Walkers. Scooters. Crutches. Turn on the lights when you go into a dark area. Replace any light bulbs as soon as they burn out. Set up your furniture so you have a clear path. Avoid moving your furniture around. If any of your floors are uneven, fix them. If there are any pets around you, be aware of where they are. Review your medicines with your doctor. Some medicines can make you feel dizzy. This can increase your chance of falling. Ask your doctor what other things that you can do to help prevent falls. This information is not intended to replace advice given to you by your health care provider. Make sure you discuss any questions you have with your health care provider. Document Released: 06/19/2009 Document Revised: 01/29/2016 Document Reviewed: 09/27/2014 Elsevier Interactive Patient Education  2017 [employer].

## 2021-03-02 DIAGNOSIS — S82031K Displaced transverse fracture of right patella, subsequent encounter for closed fracture with nonunion: Secondary | ICD-10-CM | POA: Diagnosis not present

## 2021-03-20 ENCOUNTER — Other Ambulatory Visit: Payer: Self-pay | Admitting: Family Medicine

## 2021-03-20 DIAGNOSIS — S82031K Displaced transverse fracture of right patella, subsequent encounter for closed fracture with nonunion: Secondary | ICD-10-CM | POA: Diagnosis not present

## 2021-03-20 NOTE — Telephone Encounter (Signed)
See labs from Jan, TSH was abnormal but no f/u labs have been done since and no future appts in the office (only phone call appt)

## 2021-03-21 NOTE — Telephone Encounter (Signed)
Please schedule lab for TSH and refill enough to get by until then

## 2021-03-23 NOTE — Telephone Encounter (Signed)
Med filled once and routed to support pool to get non fasting lab appt scheduled to check TSH

## 2021-04-06 ENCOUNTER — Telehealth: Payer: Self-pay

## 2021-04-06 NOTE — Chronic Care Management (AMB) (Addendum)
Chronic Care Management Pharmacy Assistant   Name: [name] [name]  MRN: [id number] DOB: Mar 17, 1946  [name] [name] is an 75 y.o. year old female who presents for her initial CCM visit with the clinical pharmacist.  Reason for Encounter: Initial Questions   Conditions to be addressed/monitored: HTN and HLD  Recent office visits:  None in 6 months  Recent consult visits:  3/23/22[facility] - Patient presented after a fall with a right patella fracture. Start course of hydrocodone 5/325 take 1 tablet every 4 hours as needed.  Hospital visits:  11/16/20- ED - Patient presented after a fall. Start hydrocodone 5/325 take 1 tablet 4 hours as needed. 10/31/20- ED - Patient presented with cough and headache. Started benzonatate '100mg'$  take 1 capsule 3 times daily.  Medications: Outpatient Encounter Medications as of 04/06/2021  Medication Sig   albuterol (PROVENTIL HFA;VENTOLIN HFA) 108 (90 BASE) MCG/ACT inhaler Inhale 2 puffs into the lungs every 4 (four) hours as needed for wheezing.   atorvastatin (LIPITOR) 80 MG tablet Take 1 tablet (80 mg total) by mouth daily.   benzonatate (TESSALON PERLES) 100 MG capsule Take 1 capsule (100 mg total) by mouth 3 (three) times daily as needed for cough.   Calcium Carbonate-Vit D-Min (CALCIUM 1200 PO) Take 1 tablet by mouth daily.    cyclobenzaprine (FLEXERIL) 10 MG tablet TAKE 1 TABLET BY MOUTH EVERYDAY AT BEDTIME AS NEEDED   doxycycline (VIBRA-TABS) 100 MG tablet    ezetimibe (ZETIA) 10 MG tablet TAKE 1 TABLET EVERY DAY   HYDROcodone-acetaminophen (NORCO/VICODIN) 5-325 MG tablet Take 1 tablet by mouth every 4 (four) hours as needed.   lactulose (CHRONULAC) 10 GM/15ML solution Take 15-30 mL by mouth once daily for 3 days or until you have a bowel movement   levothyroxine (SYNTHROID) 75 MCG tablet Take 1 tablet (75 mcg total) by mouth daily before breakfast.   lisinopril (ZESTRIL) 10 MG tablet Take 1 tablet (10 mg total) by mouth daily.    loratadine (CLARITIN) 10 MG tablet Take 1 tablet (10 mg total) by mouth daily.   LORazepam (ATIVAN) 1 MG tablet TAKE 1 TABLET (1 MG TOTAL) BY MOUTH AT BEDTIME AS NEEDED FOR SLEEP.   nortriptyline (PAMELOR) 25 MG capsule TAKE 1 CAPSULE (25 MG TOTAL) BY MOUTH AT BEDTIME. ALONG WITH THE 75 MG CAPSULE   nortriptyline (PAMELOR) 75 MG capsule TAKE 1 CAPSULE BY MOUTH AT BEDTIME.   triamcinolone (KENALOG) 0.025 % cream Apply 1 application topically 2 (two) times daily. To affected itchy areas (Patient taking differently: Apply 1 application topically 2 (two) times daily as needed (itchy skin). To affected itchy areas)   No facility-administered encounter medications on file as of 04/06/2021.     Lab Results  Component Value Date/Time   HGBA1C 6.3 07/02/2020 09:51 AM   HGBA1C 6.7 (H) 02/20/2020 09:54 AM     BP Readings from Last 3 Encounters:  11/16/20 (!) 157/79  10/31/20 131/81  08/07/20 128/76    Patient contacted to review initial questions prior to visit with Debbora Dus.  Have you seen any other providers since your last visit with PCP? Yes  Any changes in your medications or health? No the patient reorts she is healing up her Rt. Knee from a fall and still some soreness  Any side effects from any medications? No  Do you have an symptoms or problems not managed by your medications? No  Any concerns about your health right now? No the patient reports she has  not fallen again   Has your provider asked that you check blood pressure,or follow special diet at home? No the patient reports she does not have a blood pressure monitor at home  Do you get any type of exercise on a regular basis? No the patient reports she has been healing the right knee and it is still sore  Can you think of a goal you would like to reach for your health? No  Do you have any problems getting your medications? No the patient reports she uses [employer] mail order and they send her a 90ds   Is there anything  that you would like to discuss during the appointment? No   [name] [name] was reminded to have all medications, supplements and any blood glucose and blood pressure readings available for review with Debbora Dus, Pharm. D, at her telephone visit on 04/07/2021 at 11:00am.    Star Rating Drugs:  Medication:  Last Fill: Day Supply Atorvastatin '80mg'$  02/03/21 90 Lisinopril '10mg'$  02/03/21 90   Care Gaps: Last annual wellness visit: 12/19/19   Debbora Dus, CPP notified  Avel Sensor, [place] Assistant [phone]  I have reviewed the care management and care coordination activities outlined in this encounter and I am certifying that I agree with the content of this note. No further action required.  Debbora Dus, PharmD Clinical Pharmacist [facility] at [facility] [phone]

## 2021-04-07 ENCOUNTER — Other Ambulatory Visit: Payer: Self-pay

## 2021-04-07 ENCOUNTER — Ambulatory Visit (INDEPENDENT_AMBULATORY_CARE_PROVIDER_SITE_OTHER): Payer: Medicare HMO

## 2021-04-07 ENCOUNTER — Telehealth: Payer: Self-pay

## 2021-04-07 DIAGNOSIS — E7800 Pure hypercholesterolemia, unspecified: Secondary | ICD-10-CM

## 2021-04-07 DIAGNOSIS — M8000XS Age-related osteoporosis with current pathological fracture, unspecified site, sequela: Secondary | ICD-10-CM | POA: Diagnosis not present

## 2021-04-07 DIAGNOSIS — I10 Essential (primary) hypertension: Secondary | ICD-10-CM | POA: Diagnosis not present

## 2021-04-07 DIAGNOSIS — E890 Postprocedural hypothyroidism: Secondary | ICD-10-CM | POA: Diagnosis not present

## 2021-04-07 DIAGNOSIS — G479 Sleep disorder, unspecified: Secondary | ICD-10-CM

## 2021-04-07 NOTE — Telephone Encounter (Signed)
Thanks , yes pleaes have her f/u

## 2021-04-07 NOTE — Telephone Encounter (Signed)
Can we set up a visit with PCP to discuss the following:  Last TSH low 09/11/20, planned to recheck again 6 weeks. Just want to make sure this is followed up on. She also reports some urinary hesitancy and frequency before bed that seems to be contributing to her difficulty falling asleep (1-2 hours). This could be worsened by her nortriptyline.    Lab Results  Component Value Date/Time   TSH 0.29 (L) 09/11/2020 09:05 AM   TSH 0.05 (L) 08/07/2020 11:10 AM   FREET4 0.87 05/19/2010 09:04 AM   FREET4 0.5 (L) 05/29/2008 03:52 PM   Thanks,  Debbora Dus, PharmD Clinical Pharmacist [facility] at [facility] [phone]

## 2021-04-07 NOTE — Patient Instructions (Signed)
April 07, 2021  Dear [name] [name],  It was a pleasure meeting you during our initial appointment on April 07, 2021. Below is a summary of the goals we discussed and components of chronic care management. Please contact me anytime with questions or concerns.   Visit Information  Patient Care Plan: CCM Pharmacy Care Plan     Problem Identified: Disease Management   Priority: High  Onset Date: 04/07/2021  Note:   Current Barriers:  Due for PCP follow up - TSH, T4  Pharmacist Clinical Goal(s):  Patient will contact provider office for questions/concerns as evidenced notation of same in electronic health record through collaboration with PharmD and provider.   Interventions: 1:1 collaboration with Tower, Wynelle Fanny, MD regarding development and update of comprehensive plan of care as evidenced by provider attestation and co-signature Inter-disciplinary care team collaboration (see longitudinal plan of care) Comprehensive medication review performed; medication list updated in electronic medical record  Hypertension (BP goal <140/90) -Controlled - per clinic readings  -Current treatment: Lisinopril 10 mg - 1 tablet daily -Medications previously tried: none reported  -Current home readings: none, does not have monitor -Current dietary habits:   -Breakfast - 1 egg, bacon, toast  -Snack - sandwich  -Lunch - baked chicken, pork chops, green beans  -Dinner - meat and country vegetables  -Snacks - pack of crackers  -Drinks - 1 cup of coffee, 3 bottles of water, occasional soda -Current exercise habits: mows her yard, cleans home, flower garden -Denies hypotensive/hypertensive symptoms; Does report some lightheadedness -Educated on BP goals and benefits of medications for prevention of heart attack, stroke and kidney damage; -Recommended to continue current medication  Hyperlipidemia: (LDL goal < 70) -Not ideally controlled - LDL 90, HDL 70 -Current treatment: Atorvastatin 80 mg - 1  tablet daily (bedtime) Zetia 10 mg - 1 tablet daily (breakfast) -Medications previously tried: none  -Current dietary patterns: discussed limiting red meats and fatty cuts  -Educated on Cholesterol goals;  -Recommended to continue current medication  Depression/Anxiety/Sleep distubrance (Goal: Improve mood, improve sleep onset) -Controlled - per patient report and PHQ-2 today of zero -Mood is stable, but still difficulty with sleep onset -Current treatment: Lorazepam 1 mg - 1 tablet at bedtime as needed for sleep (takes every night) Nortriptyline 25 and 75 mg - take 100 mg total at bedtime  Cyclobenzaprine 10 mg - 1 tablet at bedtime as needed (takes on occasion for sleep and neck pain) -Medications previously tried/failed: melatonin, antihistamines, Restoril (ineffective) -Sleep habits - She gets in bed at 9 PM. It takes a while to fall asleep, up to 1-2 hours. She gets up and down to bathroom, feels like she is not emptying bladder. Once she falls asleep, she sleeps through night. Wakes up around 6 AM. Does not feel rested. No naps during the day. 1 cup of coffee daily. -PHQ9: 06/22 - 12  -GAD7: none -We discussed her last PHQ-9 of 12. She denies any concerns with mood. She has many activities she enjoys with friends including bowling and going to the movies. Denies anxiety or depressed mood.  -Recommended to continue current medication; Recommend re-eval TSH and refer to PCP for eval of urinary symptoms impact on sleep onset. Nortriptyline may worsen these symptoms.   Osteoporosis / Osteopenia (Goal Improve bone density) -Controlled -Patient denies any falls since ED visit 11/16/20 with right patella fracture. She does have some lightheadedness at times. Reports balance is fair. She has a walker but does not always use it. -Last  DEXA Scan: 10/2017  -Current treatment  Calcium-vitamin D - daily  Prolia 60 mg - Inject every 6 months (last 01/13/21) -Medications previously tried: multiple -  see allergies   -Recommend [phone] units of vitamin D daily. Recommend 1200 mg of calcium daily from dietary and supplemental sources. Recommend weight-bearing and muscle strengthening exercises for building and maintaining bone density. -Recommended to continue current medication  Hypothyroid (Goal: TSH, T4 WNL) -Not ideally controlled - TSH 0.26 09/11/20 - f/u lab in 6 weeks was planned, but I do not see where it was completed -Current treatment  Levothyroxine 75 mcg - 1 tablet daily (takes 30 minutes before breakfast with lisinopril, ezetimibe) -Medications previously tried: none  -Recommended update TSH, T4.  Patient Goals/Self-Care Activities Patient will:  - take medications as prescribed - follow up with PCP as recommended  Follow Up Plan: Telephone follow up appointment with care management team member scheduled for: - 3 months CCM telephone visit     Ms. Dornbusch was given information about Chronic Care Management services today including:  CCM service includes personalized support from designated clinical staff supervised by her physician, including individualized plan of care and coordination with other care providers 24/7 contact phone numbers for assistance for urgent and routine care needs. Standard insurance, coinsurance, copays and deductibles apply for chronic care management only during months in which we provide at least 20 minutes of these services. Most insurances cover these services at 100%, however patients may be responsible for any copay, coinsurance and/or deductible if applicable. This service may help you avoid the need for more expensive face-to-face services. Only one practitioner may furnish and bill the service in a calendar month. The patient may stop CCM services at any time (effective at the [name] of the month) by phone call to the office staff.  Patient agreed to services and verbal consent obtained.   Patient verbalizes understanding of instructions  provided today and agrees to view in [place].   Debbora Dus, PharmD Clinical Pharmacist [facility] [facility] at [facility] [phone]

## 2021-04-07 NOTE — Telephone Encounter (Signed)
Will route to support pool so they can schedule a f/u with PCP to discuss these issues

## 2021-04-07 NOTE — Telephone Encounter (Signed)
Spoke with patient scheduled follow up appointment

## 2021-04-07 NOTE — Progress Notes (Signed)
Chronic Care Management Pharmacy Note  04/07/2021 Name:  [name] [name] MRN:  [id number] DOB:  1946-08-16  Summary: Patient is still having difficulty falling asleep. This may be associated with urinary symptoms - reports frequent trips to empty bladder before finally falling asleep. Last TSH low, due for re-eval. No medication concerns identified.  Recommendations/Changes made from today's visit: Recommend PCP visit for labs and urinary eval/sleep discussion  Plan: CCM follow up 3 months See CCM PCP consult note   Subjective: [name] [name] is an 75 y.o. year old female who is a primary patient of Tower, Wynelle Fanny, MD.  The CCM team was consulted for assistance with disease management and care coordination needs.    Engaged with patient by telephone for initial visit in response to provider referral for pharmacy case management and/or care coordination services.   Consent to Services:  The patient was given the following information about Chronic Care Management services today, agreed to services, and gave verbal consent: 1. CCM service includes personalized support from designated clinical staff supervised by the primary care provider, including individualized plan of care and coordination with other care providers 2. 24/7 contact phone numbers for assistance for urgent and routine care needs. 3. Service will only be billed when office clinical staff spend 20 minutes or more in a month to coordinate care. 4. Only one practitioner may furnish and bill the service in a calendar month. 5.The patient may stop CCM services at any time (effective at the end of the month) by phone call to the office staff. 6. The patient will be responsible for cost sharing (co-pay) of up to 20% of the service fee (after annual deductible is met). Patient agreed to services and consent obtained.  Patient Care Team: Tower, Wynelle Fanny, MD as PCP - General Kerin Ransom Stephannie Li, OD as Consulting Physician (Optometry) Debbora Dus, [facility] as Pharmacist (Pharmacist)  Recent office visits:  01/13/21 - RN Visit - Prolia administration 09/11/20 - Lab notes - Reduce levothyroxine from 88 mcg to 75 mcg daily. Recheck in about 6 weeks. 08/07/20 - Dr. Glori Bickers, PCP - Patient presented for chronic health conditions. B12 injection today. We will watch A1c, pre-diabetes. Hypothyroidism, update labs. Levothyroxine dose reduced from 100 mcg to 88 mcg.   Recent consult visits:  3/[facility] - Patient presented after a fall with a right patella fracture. Start course of hydrocodone 5/325 take 1 tablet every 4 hours as needed.   Hospital visits:  11/16/20- ED - Patient presented after a fall. Start hydrocodone 5/325 take 1 tablet 4 hours as needed. 10/31/20- ED - Patient presented with cough and headache. Started benzonatate 171m take 1 capsule 3 times daily.   Objective:  Lab Results  Component Value Date   CREATININE 0.80 01/05/2021   BUN 13 01/05/2021   GFR 72.13 01/05/2021   GFRNONAA >60 05/11/2019   GFRAA >60 05/11/2019   NA 140 01/05/2021   K 4.0 01/05/2021   CALCIUM 9.1 01/05/2021   CO2 29 01/05/2021   GLUCOSE 96 01/05/2021    Lab Results  Component Value Date/Time   HGBA1C 6.3 07/02/2020 09:51 AM   HGBA1C 6.7 (H) 02/20/2020 09:54 AM   GFR 72.13 01/05/2021 10:20 AM   GFR 86.82 07/02/2020 09:51 AM    Lab Results  Component Value Date   CHOL 180 01/17/2020   HDL 67.30 01/17/2020   LDLCALC 90 01/17/2020   LDLDIRECT 140.9 07/04/2013   TRIG 114.0 01/17/2020   CHOLHDL 3 01/17/2020  Hepatic Function Latest Ref Rng & Units 01/17/2020 02/06/2019 12/26/2018  Total Protein 6.0 - 8.3 g/dL 6.8 6.3 7.4  Albumin 3.5 - 5.2 g/dL 4.2 3.7 3.9  AST 0 - 37 U/L 19 19 25   ALT 0 - 35 U/L 13 12 18   Alk Phosphatase 39 - 117 U/L 48 48 59  Total Bilirubin 0.2 - 1.2 mg/dL 0.5 0.4 0.5  Bilirubin, Direct 0.0 - 0.3 mg/dL - - -    Lab Results  Component Value Date/Time   TSH 0.29 (L) 09/11/2020 09:05 AM   TSH 0.05  (L) 08/07/2020 11:10 AM   FREET4 0.87 05/19/2010 09:04 AM   FREET4 0.5 (L) 05/29/2008 03:52 PM    CBC Latest Ref Rng & Units 05/11/2019 02/06/2019 12/26/2018  WBC 4.0 - 10.5 K/uL 5.0 4.8 3.9(L)  Hemoglobin 12.0 - 15.0 g/dL 14.0 13.5 14.4  Hematocrit 36.0 - 46.0 % 42.9 39.3 43.7  Platelets 150 - 400 K/uL 235 246.0 201    Lab Results  Component Value Date/Time   VD25OH 41.20 01/17/2020 09:55 AM   VD25OH 33.36 02/06/2019 10:56 AM    Clinical ASCVD: Yes  The 10-year ASCVD risk score Mikey Bussing DC Jr., et al., 2013) is: 18.6%   Values used to calculate the score:     Age: 75 years     Sex: Female     Is Non-Hispanic African American: No     Diabetic: No     Tobacco smoker: No     Systolic Blood Pressure: 706 mmHg     Is BP treated: Yes     HDL Cholesterol: 67.3 mg/dL     Total Cholesterol: 180 mg/dL    Depression screen [facility] 2/9 04/07/2021 02/16/2021 12/12/2019  Decreased Interest 0 3 0  Down, Depressed, Hopeless 0 3 1  PHQ - 2 Score 0 6 1  Altered sleeping - 3 0  Tired, decreased energy - 3 0  Change in appetite - 0 0  Feeling bad or failure about yourself  - 0 0  Trouble concentrating - 0 0  Moving slowly or fidgety/restless - 0 0  Suicidal thoughts - 0 0  PHQ-9 Score - 12 1  Difficult doing work/chores - Somewhat difficult Not difficult at all  Some recent data might be hidden    Social History   Tobacco Use  Smoking Status Former   Packs/day: 0.50   Types: Cigarettes   Quit date: 08/07/2019   Years since quitting: 1.6  Smokeless Tobacco Never   BP Readings from Last 3 Encounters:  11/16/20 (!) 157/79  10/31/20 131/81  08/07/20 128/76   Pulse Readings from Last 3 Encounters:  11/16/20 98  10/31/20 98  08/07/20 79   Wt Readings from Last 3 Encounters:  10/31/20 110 lb 3.7 oz (50 kg)  08/07/20 110 lb 5 oz (50 kg)  07/07/20 109 lb 8 oz (49.7 kg)   BMI Readings from Last 3 Encounters:  10/31/20 19.53 kg/m  08/07/20 19.54 kg/m  07/07/20 19.40 kg/m     Assessment/Interventions: Review of patient past medical history, allergies, medications, health status, including review of consultants reports, laboratory and other test data, was performed as part of comprehensive evaluation and provision of chronic care management services.   SDOH:  (Social Determinants of Health) assessments and interventions performed: Yes SDOH Interventions    Flowsheet Row Most Recent Value  SDOH Interventions   Financial Strain Interventions Intervention Not Indicated      SDOH Screenings   Alcohol Screen: Low Risk  Last Alcohol Screening Score (AUDIT): 0  Depression (PHQ2-9): Low Risk    PHQ-2 Score: 0  Financial Resource Strain: Low Risk    Difficulty of Paying Living Expenses: Not very hard  Food Insecurity: No Food Insecurity   Worried About Charity fundraiser in the Last Year: Never true   Ran Out of Food in the Last Year: Never true  Housing: Low Risk    Last Housing Risk Score: 0  Physical Activity: Inactive   Days of Exercise per Week: 0 days   Minutes of Exercise per Session: 0 min  Social Connections: Not on file  Stress: No Stress Concern Present   Feeling of Stress : Not at all  Tobacco Use: Medium Risk   Smoking Tobacco Use: Former   Smokeless Tobacco Use: Never  Transportation Needs: No Data processing manager (Medical): No   Lack of Transportation (Non-Medical): No    CCM Care Plan  Allergies  Allergen Reactions   Fosamax [Alendronate Sodium]     Dysphagia and heartburn    Raloxifene Hcl Other (See Comments)    Leg pain and cramps  Leg pain and cramps     Medications Reviewed Today     Reviewed by Debbora Dus, [facility] (Pharmacist) on 04/07/21 at 1218  Med List Status: <None>   Medication Order Taking? Sig Documenting Provider Last Dose Status Informant  atorvastatin (LIPITOR) 80 MG tablet [id number] Yes Take 1 tablet (80 mg total) by mouth daily. Tower, Wynelle Fanny, MD Taking Active   Calcium  Carbonate-Vit D-Min (CALCIUM 1200 PO) [id number] Yes Take 1 tablet by mouth daily.  [provider] Taking Active Self  cyclobenzaprine (FLEXERIL) 10 MG tablet [id number] Yes TAKE 1 TABLET BY MOUTH EVERYDAY AT BEDTIME AS NEEDED Tower, Wynelle Fanny, MD Taking Active   ezetimibe (ZETIA) 10 MG tablet [id number] Yes TAKE 1 TABLET EVERY DAY Tower, Wynelle Fanny, MD Taking Active   levothyroxine (SYNTHROID) 75 MCG tablet [id number] Yes Take 1 tablet (75 mcg total) by mouth daily before breakfast. Tower, Wynelle Fanny, MD Taking Active   lisinopril (ZESTRIL) 10 MG tablet [id number] Yes Take 1 tablet (10 mg total) by mouth daily. Tower, Wynelle Fanny, MD Taking Active   LORazepam (ATIVAN) 1 MG tablet [id number] Yes TAKE 1 TABLET (1 MG TOTAL) BY MOUTH AT BEDTIME AS NEEDED FOR SLEEP. Tower, Wynelle Fanny, MD Taking Active   nortriptyline (PAMELOR) 25 MG capsule [id number] Yes TAKE 1 CAPSULE (25 MG TOTAL) BY MOUTH AT BEDTIME. ALONG WITH THE 75 MG CAPSULE Tower, Wynelle Fanny, MD Taking Active   nortriptyline (PAMELOR) 75 MG capsule [id number] Yes TAKE 1 CAPSULE BY MOUTH AT BEDTIME. Tower, Wynelle Fanny, MD Taking Active             Patient Active Problem List   Diagnosis Date Noted   Vitamin B12 deficiency 08/07/2020   Prediabetes 03/07/2020   Tingling in extremities 03/07/2020   Elevated random blood glucose level 02/14/2020   Neck pain 11/05/2019   Medicare annual wellness visit, subsequent 10/16/2018   Skin lesion of face 10/16/2018   Benign neoplasm of ascending colon    Benign neoplasm of transverse colon    Melanosis, colon    Other specified diseases of esophagus    Stomach irritation    Constipation 01/24/2018   Compression fracture of L1 vertebra ([place]) 01/24/2018   Anxiety 01/09/2018   Coronary artery calcification seen on CAT scan 11/25/2017   Special screening for malignant neoplasms, colon 11/01/2017   Estrogen  deficiency 09/26/2017   Aortic calcification ([place]) 11/30/2016   History of patellar fracture 11/30/2016    Routine general medical examination at a health care facility 06/27/2016   Dyspepsia 06/04/2016   Loss of weight 06/04/2016   History of vertebral compression fracture 01/19/2016   Esophageal dysphagia 06/28/2014   Chronic foot pain 10/27/2011   Low back pain 04/14/2011   FEVER BLISTER 11/17/2010   Essential hypertension 08/18/2009   BUNION 03/04/2009   Allergic rhinitis 01/10/2008   H/O goiter 02/09/2007   Hypothyroidism 02/09/2007   HYPERCHOLESTEROLEMIA 02/09/2007   History of depression 02/09/2007   OSTEOARTHRITIS 02/09/2007   Osteoporosis 02/09/2007   Disturbance in sleep behavior 02/09/2007    Immunization History  Administered Date(s) Administered   Fluad Quad(high Dose 65+) 08/07/2020   Influenza Split 05/24/2012   Influenza Whole 06/16/2007   Influenza,inj,Quad PF,6+ Mos 07/04/2013, 06/28/2014, 07/25/2015, 06/04/2016, 08/31/2017, 10/16/2018   Pneumococcal Conjugate-13 08/08/2015   Pneumococcal Polysaccharide-23 11/10/2007, 06/28/2014   Td 11/10/2007    Conditions to be addressed/monitored:  Hypertension, Hyperlipidemia, Hypothyroidism, Depression, and Osteoporosis  Care Plan : [place]  Updates made by Debbora Dus, [facility] since 04/07/2021 12:00 AM     Problem: Disease Management   Priority: High  Onset Date: 04/07/2021  Note:   Current Barriers:  Due for PCP follow up - TSH, T4  Pharmacist Clinical Goal(s):  Patient will contact provider office for questions/concerns as evidenced notation of same in electronic health record through collaboration with PharmD and provider.   Interventions: 1:1 collaboration with Tower, Wynelle Fanny, MD regarding development and update of comprehensive plan of care as evidenced by provider attestation and co-signature Inter-disciplinary care team collaboration (see longitudinal plan of care) Comprehensive medication review performed; medication list updated in electronic medical record  Hypertension (BP goal  <140/90) -Controlled - per clinic readings  -Current treatment: Lisinopril 10 mg - 1 tablet daily -Medications previously tried: none reported  -Current home readings: none, does not have monitor -Current dietary habits:   -Breakfast - 1 egg, bacon, toast  -Snack - sandwich  -Lunch - baked chicken, pork chops, green beans  -Dinner - meat and country vegetables  -Snacks - pack of crackers  -Drinks - 1 cup of coffee, 3 bottles of water, occasional soda -Current exercise habits: mows her yard, cleans home, flower garden -Denies hypotensive/hypertensive symptoms; Does report some lightheadedness -Educated on BP goals and benefits of medications for prevention of heart attack, stroke and kidney damage; -Recommended to continue current medication  Hyperlipidemia: (LDL goal < 70) -Not ideally controlled - LDL 90, HDL 70 -Current treatment: Atorvastatin 80 mg - 1 tablet daily (bedtime) Zetia 10 mg - 1 tablet daily (breakfast) -Medications previously tried: none  -Current dietary patterns: discussed limiting red meats and fatty cuts  -Educated on Cholesterol goals;  -Recommended to continue current medication  Depression/Anxiety/Sleep distubrance (Goal: Improve mood, improve sleep onset) -Controlled - per patient report and PHQ-2 today of zero -Mood is stable, but still difficulty with sleep onset -Current treatment: Lorazepam 1 mg - 1 tablet at bedtime as needed for sleep (takes every night) Nortriptyline 25 and 75 mg - take 100 mg total at bedtime  Cyclobenzaprine 10 mg - 1 tablet at bedtime as needed (takes on occasion for sleep and neck pain) -Medications previously tried/failed: melatonin, antihistamines, Restoril (ineffective) -Sleep habits - She gets in bed at 9 PM. It takes a while to fall asleep, up to 1-2 hours. She gets up and down to bathroom, feels like she  is not emptying bladder. Once she falls asleep, she sleeps through night. Wakes up around 6 AM. Does not feel rested. No  naps during the day. 1 cup of coffee daily. -PHQ9: 06/22 - 12  -GAD7: none -We discussed her last PHQ-9 of 12. She denies any concerns with mood. She has many activities she enjoys with friends including bowling and going to the movies. Denies anxiety or depressed mood.  -Recommended to continue current medication; Recommend re-eval TSH and refer to PCP for eval of urinary symptoms impact on sleep onset. Nortriptyline may worsen these symptoms.   Osteoporosis / Osteopenia (Goal Improve bone density) -Controlled -Patient denies any falls since ED visit 11/16/20 with right patella fracture. She does have some lightheadedness at times. Reports balance is fair. She has a walker but does not always use it. -Last DEXA Scan: 10/2017  -Current treatment  Calcium-vitamin D - daily  Prolia 60 mg - Inject every 6 months (last 01/13/21) -Medications previously tried: multiple - see allergies   -Recommend [phone] units of vitamin D daily. Recommend 1200 mg of calcium daily from dietary and supplemental sources. Recommend weight-bearing and muscle strengthening exercises for building and maintaining bone density. -Recommended to continue current medication  Hypothyroid (Goal: TSH, T4 WNL) -Not ideally controlled - TSH 0.26 09/11/20 - f/u lab in 6 weeks was planned, but I do not see where it was completed -Current treatment  Levothyroxine 75 mcg - 1 tablet daily (takes 30 minutes before breakfast with lisinopril, ezetimibe) -Medications previously tried: none  -Recommended update TSH, T4.  Patient Goals/Self-Care Activities Patient will:  - take medications as prescribed - follow up with PCP as recommended  Follow Up Plan: Telephone follow up appointment with care management team member scheduled for: - 3 months CCM telephone visit     Medication Assistance: None required.  Patient affirms current coverage meets needs.  Compliance/Adherence/Medication fill history: Care Gaps: COVID-19 vaccine,  Shingrix, Flu vaccine   Star-Rating Drugs: Medication:                Last Fill:         Day Supply Atorvastatin 58m       02/03/21            90 Lisinopril 158m           02/03/21            90  No gaps in adherence  [facility] [place] [address] [id number]: [phone]: [phone] pill box? Yes Pt endorses 100% compliance. She takes her meds at the same time daily.  We discussed: Current pharmacy is preferred with insurance plan and patient is satisfied with pharmacy services Patient decided to: Continue current medication management strategy  Care Plan and Follow Up Patient Decision:  Patient agrees to Care Plan and Follow-up.  MiDebbora DusPharmD Clinical Pharmacist [phone]

## 2021-04-14 ENCOUNTER — Other Ambulatory Visit: Payer: Self-pay

## 2021-04-14 ENCOUNTER — Ambulatory Visit (INDEPENDENT_AMBULATORY_CARE_PROVIDER_SITE_OTHER): Payer: Medicare HMO | Admitting: Family Medicine

## 2021-04-14 ENCOUNTER — Encounter: Payer: Self-pay | Admitting: Family Medicine

## 2021-04-14 VITALS — BP 138/86 | HR 85 | Temp 98.8°F | Ht 63.0 in | Wt 110.2 lb

## 2021-04-14 DIAGNOSIS — R7303 Prediabetes: Secondary | ICD-10-CM

## 2021-04-14 DIAGNOSIS — R350 Frequency of micturition: Secondary | ICD-10-CM

## 2021-04-14 DIAGNOSIS — I10 Essential (primary) hypertension: Secondary | ICD-10-CM

## 2021-04-14 DIAGNOSIS — R8290 Unspecified abnormal findings in urine: Secondary | ICD-10-CM

## 2021-04-14 DIAGNOSIS — E890 Postprocedural hypothyroidism: Secondary | ICD-10-CM

## 2021-04-14 DIAGNOSIS — R7309 Other abnormal glucose: Secondary | ICD-10-CM

## 2021-04-14 DIAGNOSIS — I700 Atherosclerosis of aorta: Secondary | ICD-10-CM

## 2021-04-14 DIAGNOSIS — F419 Anxiety disorder, unspecified: Secondary | ICD-10-CM | POA: Diagnosis not present

## 2021-04-14 DIAGNOSIS — E7800 Pure hypercholesterolemia, unspecified: Secondary | ICD-10-CM | POA: Diagnosis not present

## 2021-04-14 DIAGNOSIS — R739 Hyperglycemia, unspecified: Secondary | ICD-10-CM

## 2021-04-14 DIAGNOSIS — E538 Deficiency of other specified B group vitamins: Secondary | ICD-10-CM

## 2021-04-14 LAB — POC URINALSYSI DIPSTICK (AUTOMATED)
Bilirubin, UA: NEGATIVE
Blood, UA: 25
Glucose, UA: NEGATIVE
Ketones, UA: NEGATIVE
Nitrite, UA: NEGATIVE
Protein, UA: NEGATIVE
Spec Grav, UA: 1.02 (ref 1.010–1.025)
Urobilinogen, UA: 0.2 E.U./dL
pH, UA: 6 (ref 5.0–8.0)

## 2021-04-14 MED ORDER — CYANOCOBALAMIN 1000 MCG/ML IJ SOLN
1000.0000 ug | Freq: Once | INTRAMUSCULAR | Status: AC
  Administered 2021-04-14: 1000 ug via INTRAMUSCULAR

## 2021-04-14 NOTE — Assessment & Plan Note (Signed)
a1c today  disc imp of low glycemic diet and wt loss to prevent DM2  Wt is stable

## 2021-04-14 NOTE — Assessment & Plan Note (Signed)
With some hesitancy  Worse at night-affecting sleep Nortriptyline (anticholinergic side eff) could worsen this  Small leuk and blood on ua  cx pending

## 2021-04-14 NOTE — Assessment & Plan Note (Signed)
Disc goals for lipids and reasons to control them Rev last labs with pt Rev low sat fat diet in detail Continues atorvastatin 80 mg daily  Diet is fair Labs today

## 2021-04-14 NOTE — Patient Instructions (Signed)
Please increase you fluids to 64 oz per day (mostly water if possible)  Try not to drink right before bed   Labs today - we will make a plan based on results  B12 shot today   Please bring a urine sample back when you can  You may have a uti (borderline) so a culture will help [place] out

## 2021-04-14 NOTE — Assessment & Plan Note (Signed)
Lipid level today on atorvastatin 80  No symptoms  bp is well controlled

## 2021-04-14 NOTE — Assessment & Plan Note (Signed)
Taking nortriptyline (? As to whether this causes some urinary retention that keeps her up further) Also for insomnia ativan prn  Stress level is down

## 2021-04-14 NOTE — Assessment & Plan Note (Signed)
bp in fair control at this time  BP Readings from Last 1 Encounters:  04/14/21 138/86   No changes needed Most recent labs reviewed  Disc lifstyle change with low sodium diet and exercise  Plan to continue lisinopril 10 mg daily

## 2021-04-14 NOTE — Assessment & Plan Note (Signed)
Lab Results  Component Value Date   TSH 0.29 (L) 09/11/2020   TSH  today - levothyroxine was decreased to 75 mcg daily

## 2021-04-14 NOTE — Assessment & Plan Note (Signed)
Level of 177 in October with tingling and fatigue  Received weekly shots for 5 weeks  Some improvement in symptoms  Stopped after that and no oral supplementation  Shot planned today after checking level  Then plan to follow result

## 2021-04-14 NOTE — Progress Notes (Signed)
Subjective:    Patient ID: [name] [name], female    DOB: 1945-12-02, 75 y.o.   MRN: [id number]  This visit occurred during the SARS-CoV-2 public health emergency.  Safety protocols were in place, including screening questions prior to the visit, additional usage of staff PPE, and extensive cleaning of exam room while observing appropriate contact time as indicated for disinfecting solutions.   HPI Pt presents for f/u of hypothyroidism and for urinary symptoms  Wt Readings from Last 3 Encounters:  04/14/21 110 lb 3 oz (50 kg)  10/31/20 110 lb 3.7 oz (50 kg)  08/07/20 110 lb 5 oz (50 kg)   19.52 kg/m  Appetite is about the same overall  If she was not tired would feel ok   Gets up and goes to to the bathroom frequently during the night At least 4 times No pain when she urinates No blood  No bladder pain  Feels like she has to go badly and then takes a while  ? Hesitancy   Results for orders placed or performed in visit on 04/14/21  POCT Urinalysis Dipstick (Automated)  Result Value Ref Range   Color, UA yellow    Clarity, UA clear    Glucose, UA Negative Negative   Bilirubin, UA Negative    Ketones, UA Negative    Spec Grav, UA 1.020 1.010 - 1.025   Blood, UA 25 Ery/uL    pH, UA 6.0 5.0 - 8.0   Protein, UA Negative Negative   Urobilinogen, UA 0.2 0.2 or 1.0 E.U./dL   Nitrite, UA Negative    Leukocytes, UA Small (1+) (A) Negative     She drinks 3 bottles of water per day    Lab Results  Component Value Date   TSH 0.29 (L) 09/11/2020   We decreased levothyroxine and planned lab in 6 weeks/she was lost to f/u  Taking 75 mcg daily  Tired  Does not have any energy  Had thyroid surgery in the past  Occ problems swallowing since then    OP- getting prolia injections  Last fracture was patella after a fall   HTN  bp is stable today  No cp or palpitations or headaches or edema  No side effects to medicines  BP Readings from Last 3 Encounters:  04/14/21  138/86  11/16/20 (!) 157/79  10/31/20 131/81     Lisinopril 10 mg daily   Chronic sleep problems and anxiety  Takes nortriptyline 100 mg at bedtime  Also ativan prn 1 mg  Has never slept well   No urinary leakage Does not wear a pad at all    In the past anthistamines and restoril do not help sleep Has tried cyclobenzaprine   Hyperlipidemia  Lab Results  Component Value Date   CHOL 180 01/17/2020   HDL 67.30 01/17/2020   LDLCALC 90 01/17/2020   LDLDIRECT 140.9 07/04/2013   TRIG 114.0 01/17/2020   CHOLHDL 3 01/17/2020   Due for labs  Takes atorvastatin 80 mg daily   Prediabetes Lab Results  Component Value Date   HGBA1C 6.3 07/02/2020   Due for labs   B12 def Lab Results  Component Value Date   VITAMINB12 177 (L) 07/02/2020   B12 shots were ordered weekly for a month  Last one was in January  Thinks that the shots helped   Prolia shots for OP  Knee fracture is healing Needs handicapped tag for car   Patient Active Problem List   Diagnosis Date Noted  Vitamin B12 deficiency 08/07/2020   Prediabetes 03/07/2020   Tingling in extremities 03/07/2020   Neck pain 11/05/2019   Medicare annual wellness visit, subsequent 10/16/2018   Skin lesion of face 10/16/2018   Benign neoplasm of ascending colon    Benign neoplasm of transverse colon    Melanosis, colon    Other specified diseases of esophagus    Stomach irritation    Constipation 01/24/2018   Compression fracture of L1 vertebra ([place]) 01/24/2018   Anxiety 01/09/2018   Coronary artery calcification seen on CAT scan 11/25/2017   Special screening for malignant neoplasms, colon 11/01/2017   Estrogen deficiency 09/26/2017   Aortic calcification ([place]) 11/30/2016   History of patellar fracture 11/30/2016   Routine general medical examination at a health care facility 06/27/2016   Loss of weight 06/04/2016   History of vertebral compression fracture 01/19/2016   Esophageal dysphagia 06/28/2014   Chronic  foot pain 10/27/2011   Low back pain 04/14/2011   FEVER BLISTER 11/17/2010   Essential hypertension 08/18/2009   BUNION 03/04/2009   Allergic rhinitis 01/10/2008   H/O goiter 02/09/2007   Hypothyroidism 02/09/2007   HYPERCHOLESTEROLEMIA 02/09/2007   History of depression 02/09/2007   OSTEOARTHRITIS 02/09/2007   Osteoporosis 02/09/2007   Disturbance in sleep behavior 02/09/2007   Past Medical History:  Diagnosis Date   COPD (chronic obstructive pulmonary disease) ([place])    Depression    Hyperlipidemia    Hypertension    Hypothyroidism    Osteoarthritis    Osteoporosis    Shingles    arm 1/12   Sleep disorder    Tobacco abuse    past; quit 09   Past Surgical History:  Procedure Laterality Date   ABDOMINAL HYSTERECTOMY     BIOPSY THYROID     pos neoplasm, surgery 09/2006   bunions  06/1998   COLONOSCOPY WITH PROPOFOL N/A 03/15/2018   Procedure: COLONOSCOPY WITH PROPOFOL;  Surgeon: Virgel Manifold, MD;  Location: [facility];  Service: Endoscopy;  Laterality: N/A;   CORONARY ANGIOPLASTY     CXR-copd, ts partial compression fracture  12/2006   dexa  12/1996   ESOPHAGOGASTRODUODENOSCOPY (EGD) WITH PROPOFOL N/A 03/15/2018   Procedure: ESOPHAGOGASTRODUODENOSCOPY (EGD) WITH PROPOFOL;  Surgeon: Virgel Manifold, MD;  Location: [facility] END[facility];  Service: Endoscopy;  Laterality: N/A;   GANGLION CYST EXCISION Right 05/16/2019   Procedure: REMOVAL GANGLION OF WRIST;  Surgeon: Corky Mull, MD;  Location: [facility];  Service: Orthopedics;  Laterality: Right;   hashimoto  02/1997   hyerectomy- cervical ca cells     LEFT HEART CATH AND CORONARY ANGIOGRAPHY Left 12/23/2017   Procedure: LEFT HEART CATH AND CORONARY ANGIOGRAPHY;  Surgeon: Minna Merritts, MD;  Location: [facility];  Service: Cardiovascular;  Laterality: Left;   left wrist fracture     surgery   right thyroid lobectomy, goiter, thyroiditis  12/2006   stress fractures foot     THYROIDECTOMY  11/2009    Social History   Tobacco Use   Smoking status: Former    Packs/day: 0.50    Types: Cigarettes    Quit date: 08/07/2019    Years since quitting: 1.6   Smokeless tobacco: Never  Vaping Use   Vaping Use: Never used  Substance Use Topics   Alcohol use: No    Alcohol/week: 0.0 standard drinks   Drug use: No   Family History  Problem Relation Age of Onset   Stroke Father    Hypertension Father  Diabetes Mother    Coronary artery disease Mother    Allergies  Allergen Reactions   Fosamax [Alendronate Sodium]     Dysphagia and heartburn    Raloxifene Hcl Other (See Comments)    Leg pain and cramps  Leg pain and cramps    Current Outpatient Medications on File Prior to Visit  Medication Sig Dispense Refill   atorvastatin (LIPITOR) 80 MG tablet Take 1 tablet (80 mg total) by mouth daily. 90 tablet 3   Calcium Carbonate-Vit D-Min (CALCIUM 1200 PO) Take 1 tablet by mouth daily.      cyclobenzaprine (FLEXERIL) 10 MG tablet TAKE 1 TABLET BY MOUTH EVERYDAY AT BEDTIME AS NEEDED 90 tablet 0   ezetimibe (ZETIA) 10 MG tablet TAKE 1 TABLET EVERY DAY 90 tablet 1   levothyroxine (SYNTHROID) 75 MCG tablet Take 1 tablet (75 mcg total) by mouth daily before breakfast. 90 tablet 0   lisinopril (ZESTRIL) 10 MG tablet Take 1 tablet (10 mg total) by mouth daily. 90 tablet 3   LORazepam (ATIVAN) 1 MG tablet TAKE 1 TABLET (1 MG TOTAL) BY MOUTH AT BEDTIME AS NEEDED FOR SLEEP. 90 tablet 0   nortriptyline (PAMELOR) 25 MG capsule TAKE 1 CAPSULE (25 MG TOTAL) BY MOUTH AT BEDTIME. ALONG WITH THE 75 MG CAPSULE 90 capsule 0   nortriptyline (PAMELOR) 75 MG capsule TAKE 1 CAPSULE BY MOUTH AT BEDTIME. 90 capsule 0   No current facility-administered medications on file prior to visit.     Review of Systems  Constitutional:  Positive for fatigue. Negative for activity change, appetite change, fever and unexpected weight change.  HENT:  Negative for congestion, ear pain, rhinorrhea, sinus pressure and sore  throat.   Eyes:  Negative for pain, redness and visual disturbance.  Respiratory:  Negative for cough, shortness of breath and wheezing.   Cardiovascular:  Negative for chest pain and palpitations.  Gastrointestinal:  Negative for abdominal pain, blood in stool, constipation and diarrhea.  Endocrine: Negative for polydipsia and polyuria.  Genitourinary:  Negative for dysuria, frequency and urgency.  Musculoskeletal:  Negative for arthralgias, back pain and myalgias.  Skin:  Negative for pallor and rash.  Allergic/Immunologic: Negative for environmental allergies.  Neurological:  Negative for dizziness, syncope and headaches.  Hematological:  Negative for adenopathy. Does not bruise/bleed easily.  Psychiatric/Behavioral:  Positive for sleep disturbance. Negative for decreased concentration and dysphoric mood. The patient is nervous/anxious.       Objective:   Physical Exam Constitutional:      General: She is not in acute distress.    Appearance: Normal appearance. She is well-developed and normal weight. She is not ill-appearing or diaphoretic.  HENT:     Head: Normocephalic and atraumatic.  Eyes:     Conjunctiva/sclera: Conjunctivae normal.     Pupils: Pupils are equal, round, and reactive to light.  Neck:     Thyroid: No thyromegaly.     Vascular: No carotid bruit or JVD.  Cardiovascular:     Rate and Rhythm: Normal rate and regular rhythm.     Heart sounds: Normal heart sounds.    No gallop.  Pulmonary:     Effort: Pulmonary effort is normal. No respiratory distress.     Breath sounds: Normal breath sounds. No wheezing or rales.  Abdominal:     General: Bowel sounds are normal. There is no distension or abdominal bruit.     Palpations: Abdomen is soft. There is no mass.     Tenderness: There is no  abdominal tenderness.  Musculoskeletal:     Cervical back: Normal range of motion and neck supple.     Right lower leg: No edema.     Left lower leg: No edema.     Comments:  Kyphosis   Lymphadenopathy:     Cervical: No cervical adenopathy.  Skin:    General: Skin is warm and dry.     Coloration: Skin is not pale.     Findings: No rash.     Comments: Mildly tanned Some sks and lentigines  Neurological:     Mental Status: She is alert.     Coordination: Coordination normal.     Deep Tendon Reflexes: Reflexes are normal and symmetric. Reflexes normal.  Psychiatric:        Mood and Affect: Mood normal.        Cognition and Memory: Cognition normal.     Comments: Pleasant  Mood is good today          Assessment & Plan:   Problem List Items Addressed This Visit       Cardiovascular and Mediastinum   Essential hypertension    bp in fair control at this time  BP Readings from Last 1 Encounters:  04/14/21 138/86  No changes needed Most recent labs reviewed  Disc lifstyle change with low sodium diet and exercise  Plan to continue lisinopril 10 mg daily        Aortic calcification ([facility])    Lipid level today on atorvastatin 80  No symptoms  bp is well controlled         Endocrine   Hypothyroidism - Primary    Lab Results  Component Value Date   TSH 0.29 (L) 09/11/2020   TSH  today - levothyroxine was decreased to 75 mcg daily         Relevant Orders   TSH     Other   HYPERCHOLESTEROLEMIA    Disc goals for lipids and reasons to control them Rev last labs with pt Rev low sat fat diet in detail Continues atorvastatin 80 mg daily  Diet is fair Labs today       Relevant Orders   Lipid panel   Anxiety    Taking nortriptyline (? As to whether this causes some urinary retention that keeps her up further) Also for insomnia ativan prn  Stress level is down         Prediabetes    a1c today  disc imp of low glycemic diet and wt loss to prevent DM2  Wt is stable       Relevant Orders   Hemoglobin A1c   Vitamin B12 deficiency    Level of 177 in October with tingling and fatigue  Received weekly shots for 5 weeks  Some  improvement in symptoms  Stopped after that and no oral supplementation  Shot planned today after checking level  Then plan to follow result        Relevant Orders   Vitamin B12   Urinary frequency    With some hesitancy  Worse at night-affecting sleep Nortriptyline (anticholinergic side eff) could worsen this  Small leuk and blood on ua  cx pending        Relevant Orders   POCT Urinalysis Dipstick (Automated) (Completed)   Urine Culture   RESOLVED: Elevated random blood glucose level    Diet has been about the same  A1C today       Other Visit Diagnoses  Abnormal urinalysis       Relevant Orders   Urine Culture

## 2021-04-14 NOTE — Assessment & Plan Note (Signed)
Diet has been about the same  A1C today

## 2021-04-15 LAB — LIPID PANEL
Cholesterol: 157 mg/dL (ref 0–200)
HDL: 59.9 mg/dL (ref >39.00)
LDL Cholesterol: 83 mg/dL (ref 0–99)
NonHDL: 96.71
Total CHOL/HDL Ratio: 3
Triglycerides: 67 mg/dL (ref 0.0–149.0)
VLDL: 13.4 mg/dL (ref 0.0–40.0)

## 2021-04-15 LAB — URINE CULTURE
MICRO NUMBER:: 12219560
SPECIMEN QUALITY:: ADEQUATE

## 2021-04-15 LAB — TSH: TSH: 0.42 u[IU]/mL (ref 0.35–5.50)

## 2021-04-15 LAB — VITAMIN B12: Vitamin B-12: 230 pg/mL (ref 211–911)

## 2021-04-15 LAB — HEMOGLOBIN A1C: Hgb A1c MFr Bld: 6.5 % (ref 4.6–6.5)

## 2021-04-18 ENCOUNTER — Other Ambulatory Visit: Payer: Self-pay | Admitting: Family Medicine

## 2021-04-19 ENCOUNTER — Telehealth: Payer: Self-pay | Admitting: Family Medicine

## 2021-04-19 MED ORDER — NORTRIPTYLINE HCL 75 MG PO CAPS: 75.0000 mg | ORAL_CAPSULE | Freq: Every day | ORAL | 3 refills | Status: DC

## 2021-04-19 NOTE — Telephone Encounter (Signed)
I sent in the 75  She was formerly taking this with an additional 25 so for now just stop the 25 and take the 75 daily

## 2021-04-19 NOTE — Telephone Encounter (Signed)
-----   Message from Tammi Sou, [place] sent at 04/17/2021  4:29 PM EDT ----- Pt notified of lab results and Dr. Marliss Coots comments.   Pt will be b12 OTC and 1st nurse visit scheduled for next month  Pt also scheduled 3 month f/u with PCP.  Pt does want to try reducing nortriptyline down to '75mg'$  please send new Rx to [facility] [address]

## 2021-04-20 NOTE — Telephone Encounter (Signed)
F/u scheduled 07/21/21, last filled on 02/03/21 #90 tabs with 0 refill, little early but going to mail order pharmacy

## 2021-05-03 ENCOUNTER — Other Ambulatory Visit: Payer: Self-pay | Admitting: Family Medicine

## 2021-05-19 ENCOUNTER — Other Ambulatory Visit: Payer: Self-pay

## 2021-05-19 ENCOUNTER — Ambulatory Visit (INDEPENDENT_AMBULATORY_CARE_PROVIDER_SITE_OTHER): Payer: Medicare HMO

## 2021-05-19 DIAGNOSIS — E538 Deficiency of other specified B group vitamins: Secondary | ICD-10-CM

## 2021-05-19 MED ORDER — CYANOCOBALAMIN 1000 MCG/ML IJ SOLN
1000.0000 ug | Freq: Once | INTRAMUSCULAR | Status: AC
Start: 2021-05-19 — End: 2021-05-19
  Administered 2021-05-19: 1000 ug via INTRAMUSCULAR

## 2021-05-19 NOTE — Progress Notes (Signed)
Per orders of Dr. Glori Bickers, injection of monthly B12 1000 mcg/ml given by Pilar Grammes, CMA in right deltoid. Patient tolerated injection well.

## 2021-05-21 ENCOUNTER — Telehealth: Payer: Self-pay | Admitting: Family Medicine

## 2021-05-21 DIAGNOSIS — U071 COVID-19: Secondary | ICD-10-CM | POA: Diagnosis not present

## 2021-05-21 DIAGNOSIS — Z20822 Contact with and (suspected) exposure to covid-19: Secondary | ICD-10-CM | POA: Diagnosis not present

## 2021-05-21 NOTE — Telephone Encounter (Signed)
Agree with that advisement  Aware, will watch for correspondence  

## 2021-05-21 NOTE — Telephone Encounter (Signed)
Pt called in stated she's having cough.chills.sore throat, congestion.unable to do a mychart  visit  Please advise [phone]

## 2021-05-21 NOTE — Telephone Encounter (Signed)
Spoke to patient by telephone and was advised that she started feeling bad yesterday. Patient stated that she did a home covid test today and it is positive. Patient stated that she has a headache, chills, some SOB and wheezing. Patient stated that her lungs are sore from coughing. Patient stated that she is unable to check her temperature. Patient was advised that she should have a face to face evaluation so that someone can listen to her lungs. Patient was given information on the [facility] since they have x-ray equipment in case she needs a chest x-ray.

## 2021-05-25 ENCOUNTER — Emergency Department
Admission: EM | Admit: 2021-05-25 | Discharge: 2021-05-25 | Disposition: A | Discharge status: Home / Self Care | Payer: Medicare HMO | Attending: Emergency Medicine | Admitting: Emergency Medicine

## 2021-05-25 ENCOUNTER — Other Ambulatory Visit: Payer: Self-pay

## 2021-05-25 DIAGNOSIS — Z85038 Personal history of other malignant neoplasm of large intestine: Secondary | ICD-10-CM | POA: Diagnosis not present

## 2021-05-25 DIAGNOSIS — E039 Hypothyroidism, unspecified: Secondary | ICD-10-CM | POA: Diagnosis not present

## 2021-05-25 DIAGNOSIS — Z79899 Other long term (current) drug therapy: Secondary | ICD-10-CM | POA: Insufficient documentation

## 2021-05-25 DIAGNOSIS — Z87891 Personal history of nicotine dependence: Secondary | ICD-10-CM | POA: Insufficient documentation

## 2021-05-25 DIAGNOSIS — J449 Chronic obstructive pulmonary disease, unspecified: Secondary | ICD-10-CM | POA: Diagnosis not present

## 2021-05-25 DIAGNOSIS — I10 Essential (primary) hypertension: Secondary | ICD-10-CM | POA: Insufficient documentation

## 2021-05-25 DIAGNOSIS — Z951 Presence of aortocoronary bypass graft: Secondary | ICD-10-CM | POA: Insufficient documentation

## 2021-05-25 DIAGNOSIS — U071 COVID-19: Secondary | ICD-10-CM | POA: Insufficient documentation

## 2021-05-25 DIAGNOSIS — R21 Rash and other nonspecific skin eruption: Secondary | ICD-10-CM

## 2021-05-25 MED ORDER — LIDOCAINE VISCOUS HCL 2 % MT SOLN: 15.0000 mL | OROMUCOSAL | 0 refills | Status: AC | PRN

## 2021-05-25 NOTE — ED Triage Notes (Signed)
Patient c/o mouth sores on upper lip and inside of mouth. Patient tested positive for COVID on Wednesday at urgent care.

## 2021-05-25 NOTE — ED Provider Notes (Signed)
[facility] Emergency Department Provider Note   ____________________________________________    I have reviewed the triage vital signs and the nursing notes.   HISTORY  Chief Complaint Mouth Lesions     HPI [name] [name] is a 75 y.o. female with history as noted below presents with complaints of rash to her mouth.  Patient reports she has COVID-19, has had symptoms for about 5 to 6 days.  Developed a rash to her lips 2 days ago.  She reports they are somewhat painful, no intraoral rash  Past Medical History:  Diagnosis Date   COPD (chronic obstructive pulmonary disease) ([place])    Depression    Hyperlipidemia    Hypertension    Hypothyroidism    Osteoarthritis    Osteoporosis    Shingles    arm 1/12   Sleep disorder    Tobacco abuse    past; quit 09    Patient Active Problem List   Diagnosis Date Noted   Urinary frequency 04/14/2021   Vitamin B12 deficiency 08/07/2020   Prediabetes 03/07/2020   Tingling in extremities 03/07/2020   Neck pain 11/05/2019   Medicare annual wellness visit, subsequent 10/16/2018   Skin lesion of face 10/16/2018   Benign neoplasm of ascending colon    Benign neoplasm of transverse colon    Melanosis, colon    Other specified diseases of esophagus    Stomach irritation    Constipation 01/24/2018   Compression fracture of L1 vertebra ([place]) 01/24/2018   Anxiety 01/09/2018   Coronary artery calcification seen on CAT scan 11/25/2017   Special screening for malignant neoplasms, colon 11/01/2017   Estrogen deficiency 09/26/2017   Aortic calcification ([place]) 11/30/2016   History of patellar fracture 11/30/2016   Routine general medical examination at a health care facility 06/27/2016   Loss of weight 06/04/2016   History of vertebral compression fracture 01/19/2016   Esophageal dysphagia 06/28/2014   Chronic foot pain 10/27/2011   Low back pain 04/14/2011   FEVER BLISTER 11/17/2010   Essential hypertension  08/18/2009   BUNION 03/04/2009   Allergic rhinitis 01/10/2008   H/O goiter 02/09/2007   Hypothyroidism 02/09/2007   HYPERCHOLESTEROLEMIA 02/09/2007   History of depression 02/09/2007   OSTEOARTHRITIS 02/09/2007   Osteoporosis 02/09/2007   Disturbance in sleep behavior 02/09/2007    Past Surgical History:  Procedure Laterality Date   ABDOMINAL HYSTERECTOMY     BIOPSY THYROID     pos neoplasm, surgery 09/2006   bunions  06/1998   COLONOSCOPY WITH PROPOFOL N/A 03/15/2018   Procedure: COLONOSCOPY WITH PROPOFOL;  Surgeon: Virgel Manifold, MD;  Location: [facility];  Service: Endoscopy;  Laterality: N/A;   CORONARY ANGIOPLASTY     CXR-copd, ts partial compression fracture  12/2006   dexa  12/1996   ESOPHAGOGASTRODUODENOSCOPY (EGD) WITH PROPOFOL N/A 03/15/2018   Procedure: ESOPHAGOGASTRODUODENOSCOPY (EGD) WITH PROPOFOL;  Surgeon: Virgel Manifold, MD;  Location: [facility];  Service: Endoscopy;  Laterality: N/A;   GANGLION CYST EXCISION Right 05/16/2019   Procedure: REMOVAL GANGLION OF WRIST;  Surgeon: Corky Mull, MD;  Location: [facility];  Service: Orthopedics;  Laterality: Right;   hashimoto  02/1997   hyerectomy- cervical ca cells     LEFT HEART CATH AND CORONARY ANGIOGRAPHY Left 12/23/2017   Procedure: LEFT HEART CATH AND CORONARY ANGIOGRAPHY;  Surgeon: Minna Merritts, MD;  Location: [facility];  Service: Cardiovascular;  Laterality: Left;   left wrist fracture     surgery  right thyroid lobectomy, goiter, thyroiditis  12/2006   stress fractures foot     THYROIDECTOMY  11/2009    Prior to Admission medications   Medication Sig Start Date End Date Taking? Authorizing Provider  lidocaine (XYLOCAINE) 2 % solution Use as directed 15 mLs in the mouth or throat as needed for mouth pain. 05/25/21  Yes Lavonia Drafts, MD  atorvastatin (LIPITOR) 80 MG tablet Take 1 tablet (80 mg total) by mouth daily. 02/03/21   Tower, Wynelle Fanny, MD  Calcium Carbonate-Vit D-Min  (CALCIUM 1200 PO) Take 1 tablet by mouth daily.     [provider]  cyclobenzaprine (FLEXERIL) 10 MG tablet TAKE 1 TABLET BY MOUTH EVERY DAY AT BEDTIME AS NEEDED 04/20/21   Tower, Wynelle Fanny, MD  ezetimibe (ZETIA) 10 MG tablet TAKE 1 TABLET EVERY DAY 12/29/20   Tower, Wynelle Fanny, MD  levothyroxine (SYNTHROID) 75 MCG tablet Take 1 tablet (75 mcg total) by mouth daily before breakfast. 05/04/21   Tower, Wynelle Fanny, MD  lisinopril (ZESTRIL) 10 MG tablet Take 1 tablet (10 mg total) by mouth daily. 02/03/21   Tower, Wynelle Fanny, MD  LORazepam (ATIVAN) 1 MG tablet TAKE 1 TABLET (1 MG TOTAL) BY MOUTH AT BEDTIME AS NEEDED FOR SLEEP. 12/29/20   Tower, Wynelle Fanny, MD  nortriptyline (PAMELOR) 75 MG capsule Take 1 capsule (75 mg total) by mouth at bedtime. 04/19/21   Tower, Wynelle Fanny, MD     Allergies Fosamax [alendronate sodium] and Raloxifene hcl  Family History  Problem Relation Age of Onset   Stroke Father    Hypertension Father    Diabetes Mother    Coronary artery disease Mother     Social History Social History   Tobacco Use   Smoking status: Former    Packs/day: 0.50    Types: Cigarettes    Quit date: 08/07/2019    Years since quitting: 1.8   Smokeless tobacco: Never  Vaping Use   Vaping Use: Never used  Substance Use Topics   Alcohol use: No    Alcohol/week: 0.0 standard drinks   Drug use: No    Review of Systems  Constitutional: No fever/chills  ENT: As above   Gastrointestinal: No abdominal pain.  No nausea, no vomiting.   Genitourinary: Negative for dysuria. Musculoskeletal: Negative for back pain. Skin: As above Neurological: Negative for headaches     ____________________________________________   PHYSICAL EXAM:  VITAL SIGNS: ED Triage Vitals  Enc Vitals Group     BP 05/25/21 1034 127/75     Pulse Rate 05/25/21 1034 84     Resp 05/25/21 1034 15     Temp 05/25/21 1034 98.3 F (36.8 C)     Temp Source 05/25/21 1034 Oral     SpO2 05/25/21 1034 98 %     Weight  05/25/21 1035 49.9 kg (110 lb)     Height 05/25/21 1035 1.6 m (5\' 3" )     Head Circumference --      Peak Flow --      Pain Score 05/25/21 1035 8     Pain Loc --      Pain Edu? --      Excl. in [place]? --      Constitutional: Alert and oriented. No acute distress. Pleasant and interactive Eyes: Conjunctivae are normal.  Head: Atraumatic. Nose: No congestion/rhinnorhea. Mouth/Throat: Mucous membranes are moist.  2 small lesions, erythematous on the lips, no further lesions noted, examined the dermatomes of the rest of the face,  no evidence of shingles.  Pharynx normal Cardiovascular: Normal rate, regular rhythm.  Respiratory: Normal respiratory effort.  No retractions. Genitourinary: deferred Musculoskeletal: No lower extremity tenderness nor edema.   Neurologic:  Normal speech and language. No gross focal neurologic deficits are appreciated.   Skin:  Skin is warm, dry and intact. .   ____________________________________________   LABS (all labs ordered are listed, but only abnormal results are displayed)  Labs Reviewed - No data to display ____________________________________________  EKG   ____________________________________________  RADIOLOGY   ____________________________________________   PROCEDURES  Procedure(s) performed: No  Procedures   Critical Care performed: No ____________________________________________   INITIAL IMPRESSION / ASSESSMENT AND PLAN / ED COURSE  Pertinent labs & imaging results that were available during my care of the patient were reviewed by me and considered in my medical decision making (see chart for details).   Patient with rash as noted above, differential includes fever blister versus COVID rash, recommend supportive care, oral lidocaine swish and spit for pain control as needed, outpatient follow-up   ____________________________________________   FINAL CLINICAL IMPRESSION(S) / ED DIAGNOSES  Final diagnoses:  Rash  associated with COVID-19      NEW MEDICATIONS STARTED DURING THIS VISIT:  Discharge Medication List as of 05/25/2021 10:45 AM     START taking these medications   Details  lidocaine (XYLOCAINE) 2 % solution Use as directed 15 mLs in the mouth or throat as needed for mouth pain., Starting Mon 05/25/2021, Normal         Note:  This document was prepared using Dragon voice recognition software and may include unintentional dictation errors.    Lavonia Drafts, MD 05/25/21 1525

## 2021-06-08 DIAGNOSIS — I700 Atherosclerosis of aorta: Secondary | ICD-10-CM | POA: Diagnosis not present

## 2021-06-08 DIAGNOSIS — S82031D Displaced transverse fracture of right patella, subsequent encounter for closed fracture with routine healing: Secondary | ICD-10-CM | POA: Diagnosis not present

## 2021-06-23 ENCOUNTER — Ambulatory Visit (INDEPENDENT_AMBULATORY_CARE_PROVIDER_SITE_OTHER): Payer: Medicare HMO

## 2021-06-23 ENCOUNTER — Other Ambulatory Visit: Payer: Self-pay

## 2021-06-23 DIAGNOSIS — E538 Deficiency of other specified B group vitamins: Secondary | ICD-10-CM | POA: Diagnosis not present

## 2021-06-23 MED ORDER — CYANOCOBALAMIN 1000 MCG/ML IJ SOLN
1000.0000 ug | Freq: Once | INTRAMUSCULAR | Status: AC
  Administered 2021-06-23: 1000 ug via INTRAMUSCULAR

## 2021-06-23 NOTE — Progress Notes (Signed)
Per orders of Dr. Tower, injection of B12 was given by Jesslynn Kruck R Mirren Gest. Patient tolerated injection well.  

## 2021-06-25 ENCOUNTER — Telehealth: Payer: Self-pay

## 2021-06-25 DIAGNOSIS — M8000XS Age-related osteoporosis with current pathological fracture, unspecified site, sequela: Secondary | ICD-10-CM

## 2021-06-25 NOTE — Telephone Encounter (Signed)
Benefits submitted-pending  Next injection due after 07/17/21 Patient already has an appointment with Dr Glori Bickers on 07/21/21 and can get injection then. Just needs labs ahead of time

## 2021-06-27 ENCOUNTER — Other Ambulatory Visit: Payer: Self-pay | Admitting: Family Medicine

## 2021-06-29 NOTE — Telephone Encounter (Signed)
Name of Medication: Ativan Name of Pharmacy: [facility] Mail order Last Fill or Written Date and Quantity: 12/29/20 #90 tabs 0 refills Last Office Visit and Type:04/14/21 f/u Next Office Visit and Type: 07/21/21 f/u

## 2021-07-08 ENCOUNTER — Telehealth: Payer: Self-pay

## 2021-07-08 NOTE — Telephone Encounter (Signed)
Benefits received. OOP cost is $295.  Lab on 07/14/21 and [place] on 07/28/21 for B12 and Prolia

## 2021-07-08 NOTE — Addendum Note (Signed)
Addended by: Kris Mouton on: 07/08/2021 11:23 AM   Modules accepted: Orders

## 2021-07-08 NOTE — Progress Notes (Signed)
    Chronic Care Management Pharmacy Assistant   Name: [name] [name]  MRN: [id number] DOB: 11/08/1945  Reason for Encounter: CCM (Appointment Reminder)   Medications: Outpatient Encounter Medications as of 07/08/2021  Medication Sig   atorvastatin (LIPITOR) 80 MG tablet Take 1 tablet (80 mg total) by mouth daily.   Calcium Carbonate-Vit D-Min (CALCIUM 1200 PO) Take 1 tablet by mouth daily.    cyclobenzaprine (FLEXERIL) 10 MG tablet TAKE 1 TABLET BY MOUTH EVERY DAY AT BEDTIME AS NEEDED   ezetimibe (ZETIA) 10 MG tablet TAKE 1 TABLET EVERY DAY   levothyroxine (SYNTHROID) 75 MCG tablet Take 1 tablet (75 mcg total) by mouth daily before breakfast.   lidocaine (XYLOCAINE) 2 % solution Use as directed 15 mLs in the mouth or throat as needed for mouth pain.   lisinopril (ZESTRIL) 10 MG tablet Take 1 tablet (10 mg total) by mouth daily.   LORazepam (ATIVAN) 1 MG tablet TAKE 1 TABLET AT BEDTIME AS NEEDED FOR SLEEP   nortriptyline (PAMELOR) 75 MG capsule Take 1 capsule (75 mg total) by mouth at bedtime.   No facility-administered encounter medications on file as of 07/08/2021.   [name] [name] was contacted to remind her of her upcoming telephone visit with Debbora Dus on 07/13/2021 at 11:00. Patient was reminded to have all medications, supplements and any blood glucose and blood pressure readings available for review at appointment.  Are you having any problems with your medications? No  Do you have any concerns you like to discuss with the pharmacist? No  Star Rating Drugs: Medication:  Last Fill: Day Supply Atorvastatin 80mg  04/17/2021 90 Lisinopril 10mg  04/17/2021 Stella, CPP notified  [name], [name]   Time Spent: 10 Minutes

## 2021-07-08 NOTE — Progress Notes (Deleted)
Chronic Care Management Pharmacy Note  07/08/2021 Name:  [name] [name] MRN:  [id number] DOB:  1945/11/15  Summary:   Recommendations/Changes made from today's visit:   Plan: CCM follow up   Subjective: [name] [name] is an 75 y.o. year old female who is a primary patient of Tower, Wynelle Fanny, MD.  The CCM team was consulted for assistance with disease management and care coordination needs.    Engaged with patient by telephone for follow up visit in response to provider referral for pharmacy case management and/or care coordination services.   Consent to Services:  The patient was given information about Chronic Care Management services, agreed to services, and gave verbal consent prior to initiation of services.  Please see initial visit note for detailed documentation.   Patient Care Team: Tower, Wynelle Fanny, MD as PCP - General Kerin Ransom Stephannie Li, OD as Consulting Physician (Optometry) Debbora Dus, [facility] as Pharmacist (Pharmacist)  Recent office visits:  04/14/21 - PCP visit - Pt presented for hypothyroidism and urinary symptoms. TSH improved, continue levothyroxine dose. Cholesterol well controlled. B12 is low normal, start B12 shots monthly and take 500 mcg daily OTC. A1c increased to 6.5%, monitor. Try reducing nortriptyline from 100 mg to 75 mg at bedtime to see if urinary retention improves. Follow up 3 months.   Recent consult visits:  06/08/21 - Orthopedic surgery - Pt presented for follow up of right patella fracture. X-rays reveal well healed.  Follow up as needed.   Hospital visits:  05/25/21 - ED visit, due to rash associated with COVID-19   Objective:  Lab Results  Component Value Date   CREATININE 0.80 01/05/2021   BUN 13 01/05/2021   GFR 72.13 01/05/2021   GFRNONAA >60 05/11/2019   GFRAA >60 05/11/2019   NA 140 01/05/2021   K 4.0 01/05/2021   CALCIUM 9.1 01/05/2021   CO2 29 01/05/2021   GLUCOSE 96 01/05/2021    Lab Results  Component Value Date/Time    HGBA1C 6.5 04/14/2021 02:29 PM   HGBA1C 6.3 07/02/2020 09:51 AM   GFR 72.13 01/05/2021 10:20 AM   GFR 86.82 07/02/2020 09:51 AM    Lab Results  Component Value Date   CHOL 157 04/14/2021   HDL 59.90 04/14/2021   LDLCALC 83 04/14/2021   LDLDIRECT 140.9 07/04/2013   TRIG 67.0 04/14/2021   CHOLHDL 3 04/14/2021    Hepatic Function Latest Ref Rng & Units 01/17/2020 02/06/2019 12/26/2018  Total Protein 6.0 - 8.3 g/dL 6.8 6.3 7.4  Albumin 3.5 - 5.2 g/dL 4.2 3.7 3.9  AST 0 - 37 U/L 19 19 25   ALT 0 - 35 U/L 13 12 18   Alk Phosphatase 39 - 117 U/L 48 48 59  Total Bilirubin 0.2 - 1.2 mg/dL 0.5 0.4 0.5  Bilirubin, Direct 0.0 - 0.3 mg/dL - - -    Lab Results  Component Value Date/Time   TSH 0.42 04/14/2021 02:29 PM   TSH 0.29 (L) 09/11/2020 09:05 AM   FREET4 0.87 05/19/2010 09:04 AM   FREET4 0.5 (L) 05/29/2008 03:52 PM    CBC Latest Ref Rng & Units 05/11/2019 02/06/2019 12/26/2018  WBC 4.0 - 10.5 K/uL 5.0 4.8 3.9(L)  Hemoglobin 12.0 - 15.0 g/dL 14.0 13.5 14.4  Hematocrit 36.0 - 46.0 % 42.9 39.3 43.7  Platelets 150 - 400 K/uL 235 246.0 201    Lab Results  Component Value Date/Time   VD25OH 41.20 01/17/2020 09:55 AM   VD25OH 33.36 02/06/2019 10:56 AM  Clinical ASCVD: Yes  The 10-year ASCVD risk score (Arnett DK, et al., 2019) is: 23.6%   Values used to calculate the score:     Age: 54 years     Sex: Female     Is Non-Hispanic African American: No     Diabetic: No     Tobacco smoker: No     Systolic Blood Pressure: 742 mmHg     Is BP treated: Yes     HDL Cholesterol: 59.9 mg/dL     Total Cholesterol: 157 mg/dL    Depression screen [facility] 2/9 04/07/2021 02/16/2021 12/12/2019  Decreased Interest 0 3 0  Down, Depressed, Hopeless 0 3 1  PHQ - 2 Score 0 6 1  Altered sleeping - 3 0  Tired, decreased energy - 3 0  Change in appetite - 0 0  Feeling bad or failure about yourself  - 0 0  Trouble concentrating - 0 0  Moving slowly or fidgety/restless - 0 0  Suicidal thoughts - 0 0  PHQ-9  Score - 12 1  Difficult doing work/chores - Somewhat difficult Not difficult at all  Some recent data might be hidden    Social History   Tobacco Use  Smoking Status Former   Packs/day: 0.50   Types: Cigarettes   Quit date: 08/07/2019   Years since quitting: 1.9  Smokeless Tobacco Never   BP Readings from Last 3 Encounters:  05/25/21 127/75  04/14/21 138/86  11/16/20 (!) 157/79   Pulse Readings from Last 3 Encounters:  05/25/21 84  04/14/21 85  11/16/20 98   Wt Readings from Last 3 Encounters:  05/25/21 110 lb (49.9 kg)  04/14/21 110 lb 3 oz (50 kg)  10/31/20 110 lb 3.7 oz (50 kg)   BMI Readings from Last 3 Encounters:  05/25/21 19.49 kg/m  04/14/21 19.52 kg/m  10/31/20 19.53 kg/m    Assessment/Interventions: Review of patient past medical history, allergies, medications, health status, including review of consultants reports, laboratory and other test data, was performed as part of comprehensive evaluation and provision of chronic care management services.   SDOH:  (Social Determinants of Health) assessments and interventions performed: Yes   SDOH Screenings   Alcohol Screen: Low Risk    Last Alcohol Screening Score (AUDIT): 0  Depression (PHQ2-9): Low Risk    PHQ-2 Score: 0  Financial Resource Strain: Low Risk    Difficulty of Paying Living Expenses: Not very hard  Food Insecurity: No Food Insecurity   Worried About Charity fundraiser in the Last Year: Never true   Ran Out of Food in the Last Year: Never true  Housing: Low Risk    Last Housing Risk Score: 0  Physical Activity: Inactive   Days of Exercise per Week: 0 days   Minutes of Exercise per Session: 0 min  Social Connections: Not on file  Stress: No Stress Concern Present   Feeling of Stress : Not at all  Tobacco Use: Medium Risk   Smoking Tobacco Use: Former   Smokeless Tobacco Use: Never   Passive Exposure: Not on file  Transportation Needs: No Transportation Needs   Lack of Transportation  (Medical): No   Lack of Transportation (Non-Medical): No    CCM Care Plan  Allergies  Allergen Reactions   Fosamax [Alendronate Sodium]     Dysphagia and heartburn    Raloxifene Hcl Other (See Comments)    Leg pain and cramps  Leg pain and cramps     Medications Reviewed Today  Reviewed by Abner Greenspan, MD (Physician) on 04/14/21 at [place] List Status: <None>   Medication Order Taking? Sig Documenting Provider Last Dose Status Informant  atorvastatin (LIPITOR) 80 MG tablet [id number] Yes Take 1 tablet (80 mg total) by mouth daily. Tower, Wynelle Fanny, MD Taking Active   Calcium Carbonate-Vit D-Min (CALCIUM 1200 PO) [id number] Yes Take 1 tablet by mouth daily.  [provider] Taking Active Self  cyclobenzaprine (FLEXERIL) 10 MG tablet [id number] Yes TAKE 1 TABLET BY MOUTH EVERYDAY AT BEDTIME AS NEEDED Tower, Wynelle Fanny, MD Taking Active   ezetimibe (ZETIA) 10 MG tablet [id number] Yes TAKE 1 TABLET EVERY DAY Tower, Wynelle Fanny, MD Taking Active   levothyroxine (SYNTHROID) 75 MCG tablet [id number] Yes Take 1 tablet (75 mcg total) by mouth daily before breakfast. Tower, Wynelle Fanny, MD Taking Active   lisinopril (ZESTRIL) 10 MG tablet [id number] Yes Take 1 tablet (10 mg total) by mouth daily. Tower, Wynelle Fanny, MD Taking Active   LORazepam (ATIVAN) 1 MG tablet [id number] Yes TAKE 1 TABLET (1 MG TOTAL) BY MOUTH AT BEDTIME AS NEEDED FOR SLEEP. Tower, Wynelle Fanny, MD Taking Active   nortriptyline (PAMELOR) 25 MG capsule [id number] Yes TAKE 1 CAPSULE (25 MG TOTAL) BY MOUTH AT BEDTIME. ALONG WITH THE 75 MG CAPSULE Tower, Wynelle Fanny, MD Taking Active   nortriptyline (PAMELOR) 75 MG capsule [id number] Yes TAKE 1 CAPSULE BY MOUTH AT BEDTIME. Tower, Wynelle Fanny, MD Taking Active             Patient Active Problem List   Diagnosis Date Noted   Urinary frequency 04/14/2021   Vitamin B12 deficiency 08/07/2020   Prediabetes 03/07/2020   Tingling in extremities 03/07/2020   Neck pain 11/05/2019   Medicare annual  wellness visit, subsequent 10/16/2018   Skin lesion of face 10/16/2018   Benign neoplasm of ascending colon    Benign neoplasm of transverse colon    Melanosis, colon    Other specified diseases of esophagus    Stomach irritation    Constipation 01/24/2018   Compression fracture of L1 vertebra ([place]) 01/24/2018   Anxiety 01/09/2018   Coronary artery calcification seen on CAT scan 11/25/2017   Special screening for malignant neoplasms, colon 11/01/2017   Estrogen deficiency 09/26/2017   Aortic calcification ([place]) 11/30/2016   History of patellar fracture 11/30/2016   Routine general medical examination at a health care facility 06/27/2016   Loss of weight 06/04/2016   History of vertebral compression fracture 01/19/2016   Esophageal dysphagia 06/28/2014   Chronic foot pain 10/27/2011   Low back pain 04/14/2011   FEVER BLISTER 11/17/2010   Essential hypertension 08/18/2009   BUNION 03/04/2009   Allergic rhinitis 01/10/2008   H/O goiter 02/09/2007   Hypothyroidism 02/09/2007   HYPERCHOLESTEROLEMIA 02/09/2007   History of depression 02/09/2007   OSTEOARTHRITIS 02/09/2007   Osteoporosis 02/09/2007   Disturbance in sleep behavior 02/09/2007    Immunization History  Administered Date(s) Administered   Fluad Quad(high Dose 65+) 08/07/2020   Influenza Split 05/24/2012   Influenza Whole 06/16/2007   Influenza,inj,Quad PF,6+ Mos 07/04/2013, 06/28/2014, 07/25/2015, 06/04/2016, 08/31/2017, 10/16/2018   Pneumococcal Conjugate-13 08/08/2015   Pneumococcal Polysaccharide-23 11/10/2007, 06/28/2014   Td 11/10/2007    Conditions to be addressed/monitored:  Hypertension, Hyperlipidemia, Hypothyroidism, Depression, and Osteoporosis  Current Barriers:  None identified   Pharmacist Clinical Goal(s):  Patient will contact provider office for questions/concerns as evidenced notation of same in electronic health record through collaboration with PharmD and provider.   Interventions:  1:1  collaboration with Tower, Wynelle Fanny, MD regarding development and update of comprehensive plan of care as evidenced by provider attestation and co-signature Inter-disciplinary care team collaboration (see longitudinal plan of care) Comprehensive medication review performed; medication list updated in electronic medical record  Hypertension (BP goal <140/90) -Controlled - per clinic readings  -Current treatment: Lisinopril 10 mg - 1 tablet daily -Medications previously tried: none reported  -Current home readings: none, does not have monitor -Current dietary habits:   -Breakfast - 1 egg, bacon, toast  -Snack - sandwich  -Lunch - baked chicken, pork chops, green beans  -Dinner - meat and country vegetables  -Snacks - pack of crackers  -Drinks - 1 cup of coffee, 3 bottles of water, occasional soda -Current exercise habits: mows her yard, cleans home, flower garden -Denies hypotensive/hypertensive symptoms; Does report some lightheadedness -Educated on BP goals and benefits of medications for prevention of heart attack, stroke and kidney damage; -Recommended to continue current medication  Hyperlipidemia: (LDL goal < 70) -Not ideally controlled - LDL 83, HDL 70 (updated 04/2021) -Current treatment: Atorvastatin 80 mg - 1 tablet daily (bedtime) Zetia 10 mg - 1 tablet daily (breakfast) -Medications previously tried: none  -Current dietary patterns: discussed limiting red meats and fatty cuts  -Educated on Cholesterol goals;  -Recommended to continue current medication  Depression/Anxiety/Sleep distubrance (Goal: Improve mood, improve sleep onset) -Controlled - per patient report -Mood is stable, but difficulty with sleep onset secondary to urinary symptoms. Nortriptyline dose reduced 04/2021 to limit urinary retention. -Current treatment: Lorazepam 1 mg - 1 tablet at bedtime as needed for sleep (takes every night) Nortriptyline 75 mg - take 1 tablet at bedtime (dose reduced  04/14/21) Cyclobenzaprine 10 mg - 1 tablet at bedtime as needed (takes on occasion for sleep and neck pain) -Medications previously tried/failed: melatonin, antihistamines, Restoril (ineffective) -Sleep habits - She gets in bed at 9 PM. It takes a while to fall asleep, up to 1-2 hours. She gets up and down to bathroom, feels like she is not emptying bladder. Once she falls asleep, she sleeps through night. Wakes up around 6 AM. Does not feel rested. No naps during the day. 1 cup of coffee daily. -PHQ9: 0 (04/2021)  -GAD7: none -Recommended to continue current medication   Osteoporosis / Osteopenia (Goal Improve bone density) -Controlled -Patient denies any falls since ED visit 11/16/20 with right patella fracture. She does have some lightheadedness at times. Reports balance is fair. She has a walker but does not always use it. -Last DEXA Scan: 10/2017  -Current treatment  Calcium-vitamin D - daily  Prolia 60 mg - Inject every 6 months (last 01/13/21) -Medications previously tried: multiple - see allergies   -Recommend [phone] units of vitamin D daily. Recommend 1200 mg of calcium daily from dietary and supplemental sources. Recommend weight-bearing and muscle strengthening exercises for building and maintaining bone density. -Recommended to continue current medication  Hypothyroid (Goal: TSH, T4 WNL) -Controlled - TSH WNL 04/2021  -Current treatment  Levothyroxine 75 mcg - 1 tablet daily (takes 30 minutes before breakfast with lisinopril, ezetimibe) -Medications previously tried: none  -Recommended continue current therapy  Patient Goals/Self-Care Activities Patient will:  - take medications as prescribed - follow up with PCP as recommended  Follow Up Plan: Telephone follow up appointment with care management team member scheduled for: - 3 months CCM telephone visit   There are no care plans that you recently modified to display for this patient.   Medication Assistance: None  required.  Patient affirms current coverage meets needs.  Compliance/Adherence/Medication fill history: Care Gaps: Shingrix, Flu vaccine   Star-Rating Drugs: Medication:                Last Fill:         Day Supply Atorvastatin 61m       02/03/21            90 Lisinopril 148m           02/03/21            90  No gaps in adherence  [facility] [id number]: [phone]: [phone] pill box? Yes Pt endorses 100% compliance. She takes her meds at the same time daily.  Care Plan and Follow Up Patient Decision:  Patient agrees to Care Plan and Follow-up.  MiDebbora DusPharmD Clinical Pharmacist [phone]

## 2021-07-13 ENCOUNTER — Telehealth: Payer: Medicare HMO

## 2021-07-14 ENCOUNTER — Telehealth: Payer: Self-pay

## 2021-07-14 ENCOUNTER — Other Ambulatory Visit: Payer: Self-pay

## 2021-07-14 ENCOUNTER — Other Ambulatory Visit (INDEPENDENT_AMBULATORY_CARE_PROVIDER_SITE_OTHER): Payer: Medicare HMO

## 2021-07-14 DIAGNOSIS — M8000XA Age-related osteoporosis with current pathological fracture, unspecified site, initial encounter for fracture: Secondary | ICD-10-CM | POA: Diagnosis not present

## 2021-07-14 DIAGNOSIS — M8000XS Age-related osteoporosis with current pathological fracture, unspecified site, sequela: Secondary | ICD-10-CM

## 2021-07-14 LAB — BASIC METABOLIC PANEL
BUN: 18 mg/dL (ref 6–23)
CO2: 28 mEq/L (ref 19–32)
Calcium: 8.8 mg/dL (ref 8.4–10.5)
Chloride: 105 mEq/L (ref 96–112)
Creatinine, Ser: 0.71 mg/dL (ref 0.40–1.20)
GFR: 82.93 mL/min (ref >60.00)
Glucose, Bld: 100 mg/dL — ABNORMAL HIGH (ref 70–99)
Potassium: 4 mEq/L (ref 3.5–5.1)
Sodium: 141 mEq/L (ref 135–145)

## 2021-07-14 NOTE — Telephone Encounter (Signed)
  Chronic Care Management   Outreach Note  07/14/2021 Name: [name] [name] MRN: [id number] DOB: [birth year]  Referred by: Tower, Wynelle Fanny, MD Reason for referral : CCM  Third unsuccessful telephone outreach was attempted today. The patient was referred to the pharmacist for assistance with care management and care coordination.   Patient was scheduled for a 3 month follow up from initial CCM visit on 07/13/21. Unable to reach despite 3 attempts. Chart was reviewed prior to call. Issues presented at initial visit were address by PCP 04/14/21. TSH updated and WNL. Her nortriptyline dose was reduced to see if urinary retention improves. Calling today to see how patient is doing in regard to urinary retention and revisit chronic conditions -HTN, HLD.   Recent office visits:  04/14/21 - PCP visit - Pt presented for hypothyroidism and urinary symptoms. TSH improved, continue levothyroxine dose. Cholesterol well controlled. B12 is low normal, start B12 shots monthly and take 500 mcg daily OTC. A1c increased to 6.5%, monitor. Try reducing nortriptyline from 100 mg to 75 mg at bedtime to see if urinary retention improves. Follow up 3 months.   Recent consult visits:  06/08/21 - Orthopedic surgery - Pt presented for follow up of right patella fracture. X-rays reveal well healed.  Follow up as needed.   Hospital visits:  05/25/21 - ED visit, due to rash associated with COVID-19  Plan: Patient will be followed by [name] moving forward. CMA call this month for general adherence and RS missed visit.  Debbora Dus, PharmD Clinical Pharmacist [facility] at [facility] [phone]

## 2021-07-14 NOTE — Chronic Care Management (AMB) (Signed)
General adherence scheduled for 1 week from today with Amy. Aware she should schedule follow up with [name].   Debbora Dus, CPP notified  Margaretmary Dys, [place] Pharmacy Assistant [phone]

## 2021-07-21 ENCOUNTER — Ambulatory Visit: Payer: Medicare HMO | Admitting: Family Medicine

## 2021-07-21 ENCOUNTER — Telehealth: Payer: Self-pay

## 2021-07-21 NOTE — Progress Notes (Signed)
Chronic Care Management Pharmacy Assistant   Name: [name] [name]  MRN: [id number] DOB: [birth year]  Reason for Encounter: CCM (General Adherence)   Recent office visits:  06/23/2021 - Dalia Heading, CMA - Patient presented for B12 injection. Administered: cyanocobalamin ((VITAMIN B-12)) injection 1,000 mcg. 05/21/2021 - Loura Pardon, MD - Telephone - Patient presented for home Covid test - Positive. Patient was advised that she should have a face to face evaluation so that someone can listen to her lungs. Patient was given information on the [facility] at [facility] since they have x-ray equipment in case she needs a chest x-ray.  05/19/2021 - Beatriz Stallion, CMA - Patient presented for B12 injection. Administered: cyanocobalamin ((VITAMIN B-12)) injection 1,000 mcg. 04/19/2021 - Loura Pardon, MD - Telephone - Stop: nortriptyline (PAMELOR) 25 MG capsule. Only take 75 mg Nortriptyline.  04/14/2021 Loura Pardon, MD - Patient presented for follow up for urinary frequency and postoperative hypothyroidism.. Labs: A1c, Lipid, TSH, Urine culture and Vitamin B12. Administered: cyanocobalamin ((VITAMIN B-12)) injection 1,000 mcg.  Recent consult visits:  06/08/2021 - Damaris Hippo, PA - Orthopedic Surgery - Patient presented for repeat evaluation status post a right patella fracture.   Hospital visits:  05/25/2021 - [facility] - Patient presented for mouth lesions due to Covid-19. Start: lidocaine (XYLOCAINE) 2 % solution.   Medications: Outpatient Encounter Medications as of 07/21/2021  Medication Sig   atorvastatin (LIPITOR) 80 MG tablet Take 1 tablet (80 mg total) by mouth daily.   Calcium Carbonate-Vit D-Min (CALCIUM 1200 PO) Take 1 tablet by mouth daily.    cyclobenzaprine (FLEXERIL) 10 MG tablet TAKE 1 TABLET BY MOUTH EVERY DAY AT BEDTIME AS NEEDED   ezetimibe (ZETIA) 10 MG tablet TAKE 1 TABLET EVERY DAY   levothyroxine (SYNTHROID) 75 MCG tablet Take 1 tablet (75  mcg total) by mouth daily before breakfast.   lidocaine (XYLOCAINE) 2 % solution Use as directed 15 mLs in the mouth or throat as needed for mouth pain.   lisinopril (ZESTRIL) 10 MG tablet Take 1 tablet (10 mg total) by mouth daily.   LORazepam (ATIVAN) 1 MG tablet TAKE 1 TABLET AT BEDTIME AS NEEDED FOR SLEEP   nortriptyline (PAMELOR) 75 MG capsule Take 1 capsule (75 mg total) by mouth at bedtime.   No facility-administered encounter medications on file as of 07/21/2021.   Contacted Raynelle L Dockham on 07/23/2021 for general disease state and medication adherence call.   Patient is not > 5 days past due for refill on the following medications per chart history:  Star Medications: Medication Name/mg Last Fill Days Supply Atorvastatin 80 mg  06/29/2021 90 Lisinopril 10 mg  06/29/2021 90   Fill dates verified with [facility]  What concerns do you have about your medications? No  The patient denies side effects with her medications.   How often do you forget or accidentally miss a dose? Never  Do you use a pillbox? Yes  Are you having any problems getting your medications from your pharmacy? No  Has the cost of your medications been a concern? No  Since last visit with CPP, no interventions have been made:   The patient has had an ED visit since last contact.   The patient denies problems with their health.   she reports the following  concerns or questions for [employer], Pharm. D at this time. Patient states she isn't sleeping well at night. Patient feels it is not helping. She doesn't think she ever  goes to sleep; she doesn't think she has slept at all this week. Patient is taking Flexeril at 8:00pm and goes to bed at 9:00pm.   Counseled patient on:  [place] job taking medications  Care Gaps: Annual wellness visit in last year? Yes 02/16/2021 Most Recent BP reading: 127/75 on 05/25/2021  Appointment with Dr. Silvio Pate on 08/11/2021 Rescheduled CCM appointment  (11/08 missed with Debbora Dus) to 12/19 with [name].  [name], CPP notified  [name], [place] Clinical Pharmacy Assistant [phone]  Time Spent: 30 Minutes

## 2021-07-21 NOTE — Telephone Encounter (Signed)
CrCl is 53.93 mL/min Lab visit done on 07/14/21 Calcium was 8.8

## 2021-07-28 ENCOUNTER — Other Ambulatory Visit: Payer: Self-pay

## 2021-07-28 ENCOUNTER — Ambulatory Visit (INDEPENDENT_AMBULATORY_CARE_PROVIDER_SITE_OTHER): Payer: Medicare HMO

## 2021-07-28 DIAGNOSIS — M8000XS Age-related osteoporosis with current pathological fracture, unspecified site, sequela: Secondary | ICD-10-CM

## 2021-07-28 DIAGNOSIS — M8000XA Age-related osteoporosis with current pathological fracture, unspecified site, initial encounter for fracture: Secondary | ICD-10-CM | POA: Diagnosis not present

## 2021-07-28 DIAGNOSIS — E538 Deficiency of other specified B group vitamins: Secondary | ICD-10-CM

## 2021-07-28 MED ORDER — CYANOCOBALAMIN 1000 MCG/ML IJ SOLN
1000.0000 ug | Freq: Once | INTRAMUSCULAR | Status: AC
  Administered 2021-07-28: 1000 ug via INTRAMUSCULAR

## 2021-07-28 MED ORDER — DENOSUMAB 60 MG/ML ~~LOC~~ SOSY
60.0000 mg | PREFILLED_SYRINGE | Freq: Once | SUBCUTANEOUS | Status: AC
  Administered 2021-07-28: 60 mg via SUBCUTANEOUS

## 2021-07-28 NOTE — Progress Notes (Signed)
Per orders of Dr. Glori Bickers, injection of Prolia and B12 given by Loreen Freud. Patient tolerated injection well.

## 2021-08-11 ENCOUNTER — Ambulatory Visit (INDEPENDENT_AMBULATORY_CARE_PROVIDER_SITE_OTHER): Payer: Medicare HMO | Admitting: Internal Medicine

## 2021-08-11 ENCOUNTER — Encounter: Payer: Self-pay | Admitting: Internal Medicine

## 2021-08-11 ENCOUNTER — Other Ambulatory Visit: Payer: Self-pay

## 2021-08-11 VITALS — BP 112/74 | HR 70 | Temp 97.8°F | Ht 63.0 in | Wt 112.0 lb

## 2021-08-11 DIAGNOSIS — R350 Frequency of micturition: Secondary | ICD-10-CM | POA: Diagnosis not present

## 2021-08-11 DIAGNOSIS — Z23 Encounter for immunization: Secondary | ICD-10-CM | POA: Diagnosis not present

## 2021-08-11 NOTE — Addendum Note (Signed)
Addended by: Pilar Grammes on: 08/11/2021 11:07 AM   Modules accepted: Orders

## 2021-08-11 NOTE — Progress Notes (Signed)
Subjective:    Patient ID: [name] [name], female    DOB: 16-May-1946, 75 y.o.   MRN: [id number]  HPI Here for follow up of urinary problems This visit occurred during the SARS-CoV-2 public health emergency.  Safety protocols were in place, including screening questions prior to the visit, additional usage of staff PPE, and extensive cleaning of exam room while observing appropriate contact time as indicated for disinfecting solutions.   Still doesn't sleep well No difference on the lower nortriptyline She notes less urinary frequency during the day Nocturia x 1-2 ---no change Good urinary flow and she seems to empty adequately No dysuria or hematuria  Current Outpatient Medications on File Prior to Visit  Medication Sig Dispense Refill   atorvastatin (LIPITOR) 80 MG tablet Take 1 tablet (80 mg total) by mouth daily. 90 tablet 3   Calcium Carbonate-Vit D-Min (CALCIUM 1200 PO) Take 1 tablet by mouth daily.      cyclobenzaprine (FLEXERIL) 10 MG tablet TAKE 1 TABLET BY MOUTH EVERY DAY AT BEDTIME AS NEEDED 90 tablet 0   ezetimibe (ZETIA) 10 MG tablet TAKE 1 TABLET EVERY DAY 90 tablet 1   levothyroxine (SYNTHROID) 75 MCG tablet Take 1 tablet (75 mcg total) by mouth daily before breakfast. 90 tablet 1   lidocaine (XYLOCAINE) 2 % solution Use as directed 15 mLs in the mouth or throat as needed for mouth pain. 100 mL 0   lisinopril (ZESTRIL) 10 MG tablet Take 1 tablet (10 mg total) by mouth daily. 90 tablet 3   LORazepam (ATIVAN) 1 MG tablet TAKE 1 TABLET AT BEDTIME AS NEEDED FOR SLEEP 90 tablet 0   nortriptyline (PAMELOR) 75 MG capsule Take 1 capsule (75 mg total) by mouth at bedtime. 90 capsule 3   No current facility-administered medications on file prior to visit.    Allergies  Allergen Reactions   Fosamax [Alendronate Sodium]     Dysphagia and heartburn    Raloxifene Hcl Other (See Comments)    Leg pain and cramps  Leg pain and cramps     Past Medical History:  Diagnosis Date    COPD (chronic obstructive pulmonary disease) (HCC)    Depression    Hyperlipidemia    Hypertension    Hypothyroidism    Osteoarthritis    Osteoporosis    Shingles    arm 1/12   Sleep disorder    Tobacco abuse    past; quit 09    Past Surgical History:  Procedure Laterality Date   ABDOMINAL HYSTERECTOMY     BIOPSY THYROID     pos neoplasm, surgery 09/2006   bunions  06/1998   COLONOSCOPY WITH PROPOFOL N/A 03/15/2018   Procedure: COLONOSCOPY WITH PROPOFOL;  Surgeon: Virgel Manifold, MD;  Location: [facility];  Service: Endoscopy;  Laterality: N/A;   CORONARY ANGIOPLASTY     CXR-copd, ts partial compression fracture  12/2006   dexa  12/1996   ESOPHAGOGASTRODUODENOSCOPY (EGD) WITH PROPOFOL N/A 03/15/2018   Procedure: ESOPHAGOGASTRODUODENOSCOPY (EGD) WITH PROPOFOL;  Surgeon: Virgel Manifold, MD;  Location: [facility];  Service: Endoscopy;  Laterality: N/A;   GANGLION CYST EXCISION Right 05/16/2019   Procedure: REMOVAL GANGLION OF WRIST;  Surgeon: Corky Mull, MD;  Location: [facility];  Service: Orthopedics;  Laterality: Right;   hashimoto  02/1997   hyerectomy- cervical ca cells     LEFT HEART CATH AND CORONARY ANGIOGRAPHY Left 12/23/2017   Procedure: LEFT HEART CATH AND CORONARY ANGIOGRAPHY;  Surgeon: Minna Merritts, MD;  Location: [place];  Service: Cardiovascular;  Laterality: Left;   left wrist fracture     surgery   right thyroid lobectomy, goiter, thyroiditis  12/2006   stress fractures foot     THYROIDECTOMY  11/2009    Family History  Problem Relation Age of Onset   Stroke Father    Hypertension Father    Diabetes Mother    Coronary artery disease Mother     Social History   Socioeconomic History   Marital status: Widowed    Spouse name: Not on file   Number of children: Not on file   Years of education: Not on file   Highest education level: Not on file  Occupational History   Not on file  Tobacco Use   Smoking status: Former     Packs/day: 0.50    Types: Cigarettes    Quit date: 08/07/2019    Years since quitting: 2.0   Smokeless tobacco: Never  Vaping Use   Vaping Use: Never used  Substance and Sexual Activity   Alcohol use: No    Alcohol/week: 0.0 standard drinks   Drug use: No   Sexual activity: Not on file  Other Topics Concern   Not on file  Social History Narrative   Marital Status: widowed.    3 children   Lost husband to throat cancer in 8/16 .    Social Determinants of Health   Financial Resource Strain: Low Risk    Difficulty of Paying Living Expenses: Not very hard  Food Insecurity: No Food Insecurity   Worried About Charity fundraiser in the Last Year: Never true   Ran Out of Food in the Last Year: Never true  Transportation Needs: No Transportation Needs   Lack of Transportation (Medical): No   Lack of Transportation (Non-Medical): No  Physical Activity: Inactive   Days of Exercise per Week: 0 days   Minutes of Exercise per Session: 0 min  Stress: No Stress Concern Present   Feeling of Stress : Not at all  Social Connections: Not on file  Intimate Partner Violence: Not At Risk   Fear of Current or Ex-Partner: No   Emotionally Abused: No   Physically Abused: No   Sexually Abused: No   Review of Systems Appetite is not that big Weight is stable     Objective:   Physical Exam Constitutional:      Appearance: Normal appearance.  Neurological:     Mental Status: She is alert.  Psychiatric:        Mood and Affect: Mood normal.        Behavior: Behavior normal.           Assessment & Plan:

## 2021-08-11 NOTE — Assessment & Plan Note (Signed)
Seems improved on the lower nortriptyline and no change in nocturia She feels the flow is good and nothing to suggest urinary retention Offered trial with further reduction and she prefers to keep it at 75mg 

## 2021-08-17 ENCOUNTER — Ambulatory Visit (INDEPENDENT_AMBULATORY_CARE_PROVIDER_SITE_OTHER): Payer: Medicare HMO | Admitting: Family

## 2021-08-17 ENCOUNTER — Other Ambulatory Visit: Payer: Self-pay

## 2021-08-17 ENCOUNTER — Encounter: Payer: Self-pay | Admitting: Family

## 2021-08-17 VITALS — BP 142/86 | HR 90 | Temp 97.3°F | Ht 63.0 in | Wt 108.0 lb

## 2021-08-17 DIAGNOSIS — H6002 Abscess of left external ear: Secondary | ICD-10-CM | POA: Insufficient documentation

## 2021-08-17 MED ORDER — SULFAMETHOXAZOLE-TRIMETHOPRIM 800-160 MG PO TABS: 1.0000 | ORAL_TABLET | Freq: Two times a day (BID) | ORAL | 14 refills | Status: DC

## 2021-08-17 NOTE — Assessment & Plan Note (Signed)
New problem antibiotic prescribed and sent to the pharmacy. Stat Referral was placed for general surgery for possible irrigation and drainage, appointment is for tomorrow at 1115.  Details for appointment were given to patient in the aftercare summary.  Advised patient to continue to monitor the site for worsening signs and symptoms of infection to include increasing warmth, size, tenderness.  I instructed patient to apply warm compresses to the site as well.  Patient instructed to follow-up with me in 1 week if there is no already scheduled follow-up post appt tomorrow -with the  general surgeon.

## 2021-08-17 NOTE — Patient Instructions (Addendum)
A referral was placed to general surgeon for possible irrigation and drainage of the abscess site for tomorrow 08/18/21 at 11:15 with Dr. Christian Mate.    An antibiotic was prescribed to your preferred pharmacy today, please pick up and take as directed.  Please monitor site for worsening signs/symptoms of infection to include: increasing redness, increasing tenderness, increase in size, and or pustulant drainage from site. If this is to occur please let me know immediately.   It was a pleasure seeing you today! Please do not hesitate to reach out with any questions and or concerns.  Regards,   Eugenia Pancoast FNP-C

## 2021-08-17 NOTE — Progress Notes (Signed)
Established Patient Office Visit  Subjective:  Patient ID: [name] [name], female    DOB: [birth year]  Age: 75 y.o. MRN: [id number]  CC:  Chief Complaint  Patient presents with   knot on ear    Left, been there about a week      HPI [name] [name] is here today with c/o ' a knot' on the back of her left ear and is tender to the touch.  She does state that there is some redness to the abscess as well.  She has not noticed any drainage.  She noticed this six days ago. She has not taken any medication for this and or done any complimentary therapy to it. No known trauma or injury to her knowledge. No ear pain inside of the ear.   Past Medical History:  Diagnosis Date   COPD (chronic obstructive pulmonary disease) ([place])    Depression    Hyperlipidemia    Hypertension    Hypothyroidism    Osteoarthritis    Osteoporosis    Shingles    arm 1/12   Sleep disorder    Tobacco abuse    past; quit 09    Past Surgical History:  Procedure Laterality Date   ABDOMINAL HYSTERECTOMY     BIOPSY THYROID     pos neoplasm, surgery 09/2006   bunions  06/1998   COLONOSCOPY WITH PROPOFOL N/A 03/15/2018   Procedure: COLONOSCOPY WITH PROPOFOL;  Surgeon: Virgel Manifold, MD;  Location: [facility];  Service: Endoscopy;  Laterality: N/A;   CORONARY ANGIOPLASTY     CXR-copd, ts partial compression fracture  12/2006   dexa  12/1996   ESOPHAGOGASTRODUODENOSCOPY (EGD) WITH PROPOFOL N/A 03/15/2018   Procedure: ESOPHAGOGASTRODUODENOSCOPY (EGD) WITH PROPOFOL;  Surgeon: Virgel Manifold, MD;  Location: [facility];  Service: Endoscopy;  Laterality: N/A;   GANGLION CYST EXCISION Right 05/16/2019   Procedure: REMOVAL GANGLION OF WRIST;  Surgeon: Corky Mull, MD;  Location: [facility];  Service: Orthopedics;  Laterality: Right;   hashimoto  02/1997   hyerectomy- cervical ca cells     LEFT HEART CATH AND CORONARY ANGIOGRAPHY Left 12/23/2017   Procedure: LEFT HEART CATH AND CORONARY ANGIOGRAPHY;   Surgeon: Minna Merritts, MD;  Location: [facility] [facility];  Service: Cardiovascular;  Laterality: Left;   left wrist fracture     surgery   right thyroid lobectomy, goiter, thyroiditis  12/2006   stress fractures foot     THYROIDECTOMY  11/2009    Family History  Problem Relation Age of Onset   Stroke Father    Hypertension Father    Diabetes Mother    Coronary artery disease Mother     Social History   Socioeconomic History   Marital status: Widowed    Spouse name: Not on file   Number of children: Not on file   Years of education: Not on file   Highest education level: Not on file  Occupational History   Not on file  Tobacco Use   Smoking status: Former    Packs/day: 0.50    Types: Cigarettes    Quit date: 08/07/2019    Years since quitting: 2.0   Smokeless tobacco: Never  Vaping Use   Vaping Use: Never used  Substance and Sexual Activity   Alcohol use: No    Alcohol/week: 0.0 standard drinks   Drug use: No   Sexual activity: Not on file  Other Topics Concern   Not on file  Social History Narrative  Marital Status: widowed.    3 children   Lost husband to throat cancer in 8/16 .    Social Determinants of Health   Financial Resource Strain: Low Risk    Difficulty of Paying Living Expenses: Not very hard  Food Insecurity: No Food Insecurity   Worried About Charity fundraiser in the Last Year: Never true   Ran Out of Food in the Last Year: Never true  Transportation Needs: No Transportation Needs   Lack of Transportation (Medical): No   Lack of Transportation (Non-Medical): No  Physical Activity: Inactive   Days of Exercise per Week: 0 days   Minutes of Exercise per Session: 0 min  Stress: No Stress Concern Present   Feeling of Stress : Not at all  Social Connections: Not on file  Intimate Partner Violence: Not At Risk   Fear of Current or Ex-Partner: No   Emotionally Abused: No   Physically Abused: No   Sexually Abused: No    Outpatient  Medications Prior to Visit  Medication Sig Dispense Refill   atorvastatin (LIPITOR) 80 MG tablet Take 1 tablet (80 mg total) by mouth daily. 90 tablet 3   Calcium Carbonate-Vit D-Min (CALCIUM 1200 PO) Take 1 tablet by mouth daily.      cyclobenzaprine (FLEXERIL) 10 MG tablet TAKE 1 TABLET BY MOUTH EVERY DAY AT BEDTIME AS NEEDED 90 tablet 0   ezetimibe (ZETIA) 10 MG tablet TAKE 1 TABLET EVERY DAY 90 tablet 1   levothyroxine (SYNTHROID) 75 MCG tablet Take 1 tablet (75 mcg total) by mouth daily before breakfast. 90 tablet 1   lidocaine (XYLOCAINE) 2 % solution Use as directed 15 mLs in the mouth or throat as needed for mouth pain. 100 mL 0   lisinopril (ZESTRIL) 10 MG tablet Take 1 tablet (10 mg total) by mouth daily. 90 tablet 3   LORazepam (ATIVAN) 1 MG tablet TAKE 1 TABLET AT BEDTIME AS NEEDED FOR SLEEP 90 tablet 0   nortriptyline (PAMELOR) 75 MG capsule Take 1 capsule (75 mg total) by mouth at bedtime. 90 capsule 3   No facility-administered medications prior to visit.    Allergies  Allergen Reactions   Fosamax [Alendronate Sodium]     Dysphagia and heartburn    Raloxifene Hcl Other (See Comments)    Leg pain and cramps  Leg pain and cramps     ROS Review of Systems  Constitutional:  Negative for chills and fever.  HENT:  Positive for ear pain (left external ear with 'knot' that is very tender, no drainage. some redness.). Negative for ear discharge.      Objective:    Physical Exam Vitals reviewed.  Constitutional:      General: She is not in acute distress.    Appearance: Normal appearance. She is normal weight. She is not ill-appearing, toxic-appearing or diaphoretic.  HENT:     Right Ear: Hearing, tympanic membrane, ear canal and external ear normal.     Left Ear: Hearing, tympanic membrane and ear canal normal. Tenderness (left external ear) present.     Ears:   Neurological:     Mental Status: She is alert.     BP (!) 142/86   Pulse 90   Temp (!) 97.3 F (36.3  C) (Temporal)   Ht 5\' 3"  (1.6 m)   Wt 108 lb (49 kg)   SpO2 97%   BMI 19.13 kg/m  Wt Readings from Last 3 Encounters:  08/17/21 108 lb (49 kg)  08/11/21  112 lb (50.8 kg)  05/25/21 110 lb (49.9 kg)     Health Maintenance Due  Topic Date Due   Zoster Vaccines- Shingrix (1 of 2) Never done    There are no preventive care reminders to display for this patient.  Lab Results  Component Value Date   TSH 0.42 04/14/2021   Lab Results  Component Value Date   WBC 5.0 05/11/2019   HGB 14.0 05/11/2019   HCT 42.9 05/11/2019   MCV 94.9 05/11/2019   PLT 235 05/11/2019   Lab Results  Component Value Date   NA 141 07/14/2021   K 4.0 07/14/2021   CO2 28 07/14/2021   GLUCOSE 100 (H) 07/14/2021   BUN 18 07/14/2021   CREATININE 0.71 07/14/2021   BILITOT 0.5 01/17/2020   ALKPHOS 48 01/17/2020   AST 19 01/17/2020   ALT 13 01/17/2020   PROT 6.8 01/17/2020   ALBUMIN 4.2 01/17/2020   CALCIUM 8.8 07/14/2021   ANIONGAP 8 05/11/2019   GFR 82.93 07/14/2021   Lab Results  Component Value Date   HGBA1C 6.5 04/14/2021      Assessment & Plan:   Problem List Items Addressed This Visit       Nervous and Auditory   Abscess of external ear, left - Primary    New problem antibiotic prescribed and sent to the pharmacy. Stat Referral was placed for general surgery for possible irrigation and drainage, appointment is for tomorrow at 1115.  Details for appointment were given to patient in the aftercare summary.  Advised patient to continue to monitor the site for worsening signs and symptoms of infection to include increasing warmth, size, tenderness.  I instructed patient to apply warm compresses to the site as well.  Patient instructed to follow-up with me in 1 week if there is no already scheduled follow-up post appt tomorrow -with the  general surgeon.      Relevant Medications   sulfamethoxazole-trimethoprim (BACTRIM DS) 800-160 MG tablet   Other Relevant Orders   Ambulatory referral  to General Surgery    Meds ordered this encounter  Medications   sulfamethoxazole-trimethoprim (BACTRIM DS) 800-160 MG tablet    Sig: Take 1 tablet by mouth 2 (two) times daily.    Dispense:  7 tablet    Refill:  14    Order Specific Question:   Supervising Provider    Answer:   Diona Browner, AMY E [[id number]]    Follow-up: Return in about 1 week (around 08/24/2021) for follow up on abscess unless f/u scheduled with general surgery.    Eugenia Pancoast, FNP

## 2021-08-18 ENCOUNTER — Ambulatory Visit: Payer: Medicare HMO | Admitting: Surgery

## 2021-08-18 ENCOUNTER — Encounter: Payer: Self-pay | Admitting: Surgery

## 2021-08-18 ENCOUNTER — Telehealth: Payer: Self-pay

## 2021-08-18 VITALS — BP 188/82 | HR 78 | Temp 98.0°F | Ht 63.0 in | Wt 108.4 lb

## 2021-08-18 DIAGNOSIS — H6002 Abscess of left external ear: Secondary | ICD-10-CM

## 2021-08-18 NOTE — Progress Notes (Signed)
Patient ID: [name] [name], female   DOB: 10-27-1945, 75 y.o.   MRN: [id number]  Chief Complaint: Abscess left posterior ear  History of Present Illness [name] [name] is a 75 y.o. female with an acute development of a swollen tender area of the left posterior ear, initiated on Bactrim.  It remains quite tender to touch, she reports a small amount of red drainage has not utilized any compressive heat or other measures.  Remains tender today.  No prior history of similar process.  Past Medical History Past Medical History:  Diagnosis Date   COPD (chronic obstructive pulmonary disease) ([place])    Depression    Hyperlipidemia    Hypertension    Hypothyroidism    Osteoarthritis    Osteoporosis    Shingles    arm 1/12   Sleep disorder    Tobacco abuse    past; quit 09      Past Surgical History:  Procedure Laterality Date   ABDOMINAL HYSTERECTOMY     BIOPSY THYROID     pos neoplasm, surgery 09/2006   bunions  06/1998   COLONOSCOPY WITH PROPOFOL N/A 03/15/2018   Procedure: COLONOSCOPY WITH PROPOFOL;  Surgeon: Virgel Manifold, MD;  Location: [facility];  Service: Endoscopy;  Laterality: N/A;   CORONARY ANGIOPLASTY     CXR-copd, ts partial compression fracture  12/2006   dexa  12/1996   ESOPHAGOGASTRODUODENOSCOPY (EGD) WITH PROPOFOL N/A 03/15/2018   Procedure: ESOPHAGOGASTRODUODENOSCOPY (EGD) WITH PROPOFOL;  Surgeon: Virgel Manifold, MD;  Location: [facility];  Service: Endoscopy;  Laterality: N/A;   GANGLION CYST EXCISION Right 05/16/2019   Procedure: REMOVAL GANGLION OF WRIST;  Surgeon: Corky Mull, MD;  Location: [facility];  Service: Orthopedics;  Laterality: Right;   hashimoto  02/1997   hyerectomy- cervical ca cells     LEFT HEART CATH AND CORONARY ANGIOGRAPHY Left 12/23/2017   Procedure: LEFT HEART CATH AND CORONARY ANGIOGRAPHY;  Surgeon: Minna Merritts, MD;  Location: [facility];  Service: Cardiovascular;  Laterality: Left;   left wrist fracture      surgery   right thyroid lobectomy, goiter, thyroiditis  12/2006   stress fractures foot     THYROIDECTOMY  11/2009    Allergies  Allergen Reactions   Fosamax [Alendronate Sodium]     Dysphagia and heartburn    Raloxifene Hcl Other (See Comments)    Leg pain and cramps  Leg pain and cramps     Current Outpatient Medications  Medication Sig Dispense Refill   atorvastatin (LIPITOR) 80 MG tablet Take 1 tablet (80 mg total) by mouth daily. 90 tablet 3   Calcium Carbonate-Vit D-Min (CALCIUM 1200 PO) Take 1 tablet by mouth daily.      cyclobenzaprine (FLEXERIL) 10 MG tablet TAKE 1 TABLET BY MOUTH EVERY DAY AT BEDTIME AS NEEDED 90 tablet 0   ezetimibe (ZETIA) 10 MG tablet TAKE 1 TABLET EVERY DAY 90 tablet 1   levothyroxine (SYNTHROID) 75 MCG tablet Take 1 tablet (75 mcg total) by mouth daily before breakfast. 90 tablet 1   lidocaine (XYLOCAINE) 2 % solution Use as directed 15 mLs in the mouth or throat as needed for mouth pain. 100 mL 0   lisinopril (ZESTRIL) 10 MG tablet Take 1 tablet (10 mg total) by mouth daily. 90 tablet 3   LORazepam (ATIVAN) 1 MG tablet TAKE 1 TABLET AT BEDTIME AS NEEDED FOR SLEEP 90 tablet 0   nortriptyline (PAMELOR) 75 MG capsule Take 1 capsule (75 mg  total) by mouth at bedtime. 90 capsule 3   sulfamethoxazole-trimethoprim (BACTRIM DS) 800-160 MG tablet Take 1 tablet by mouth 2 (two) times daily. 7 tablet 14   No current facility-administered medications for this visit.    Family History Family History  Problem Relation Age of Onset   Stroke Father    Hypertension Father    Diabetes Mother    Coronary artery disease Mother       Social History Social History   Tobacco Use   Smoking status: Former    Packs/day: 0.50    Types: Cigarettes    Quit date: 08/07/2019    Years since quitting: 2.0   Smokeless tobacco: Never  Vaping Use   Vaping Use: Never used  Substance Use Topics   Alcohol use: No    Alcohol/week: 0.0 standard drinks   Drug use: No         Review of Systems  Constitutional:  Positive for weight loss.  HENT: Negative.    Eyes: Negative.   Respiratory:  Positive for cough, shortness of breath and wheezing.   Cardiovascular: Negative.   Gastrointestinal:  Positive for heartburn.  Genitourinary: Negative.   Skin:  Negative for itching and rash.  Neurological:  Positive for headaches.  Psychiatric/Behavioral: Negative.       Physical Exam Blood pressure (!) 188/82, pulse 78, temperature 98 F (36.7 C), temperature source Oral, height 5\' 3"  (1.6 m), weight 108 lb 6.4 oz (49.2 kg), SpO2 92 %. Last Weight  Most recent update: 08/18/2021 10:54 AM    Weight  49.2 kg (108 lb 6.4 oz)             CONSTITUTIONAL: Well developed, somewhat frail-appearing, but well-kempt and adequately nourished, appropriately responsive and aware without distress.  Marked thoracic kyphosis. EYES: Sclera non-icteric.   EARS, NOSE, MOUTH AND THROAT: Mask worn.   The oropharynx is clear. Oral mucosa is pink and moist.    Hearing is intact to voice.  NECK: Trachea is midline, and there is no jugular venous distension.  LYMPH NODES:  Lymph nodes in the neck are not enlarged. RESPIRATORY:  Breath sounds are equal bilaterally. Normal respiratory effort without pathologic use of accessory muscles. CARDIOVASCULAR: Heart is regular in rate and rhythm. GI: The abdomen is  soft, nontender, and nondistended.  MUSCULOSKELETAL:  Symmetrical muscle tone appreciated in all four extremities.    SKIN: Skin turgor is normal. No pathologic skin lesions appreciated.  Fluctuant abscess of the angle of the posterior ear and the mastoid area. NEUROLOGIC:  Motor and sensation appear grossly normal.  Cranial nerves are grossly without defect. PSYCH:  Alert and oriented to person, place and time. Affect is appropriate for situation.  Data Reviewed I have personally reviewed what is currently available of the patient's imaging, recent labs and medical records.    Labs:  CBC Latest Ref Rng & Units 05/11/2019 02/06/2019 12/26/2018  WBC 4.0 - 10.5 K/uL 5.0 4.8 3.9(L)  Hemoglobin 12.0 - 15.0 g/dL 14.0 13.5 14.4  Hematocrit 36.0 - 46.0 % 42.9 39.3 43.7  Platelets 150 - 400 K/uL 235 246.0 201   CMP Latest Ref Rng & Units 07/14/2021 01/05/2021 07/02/2020  Glucose 70 - 99 mg/dL 100(H) 96 91  BUN 6 - 23 mg/dL 18 13 10   Creatinine 0.40 - 1.20 mg/dL 0.71 0.80 0.64  Sodium 135 - 145 mEq/L 141 140 141  Potassium 3.5 - 5.1 mEq/L 4.0 4.0 3.8  Chloride 96 - 112 mEq/L 105 105 103  CO2 19 - 32 mEq/L 28 29 30   Calcium 8.4 - 10.5 mg/dL 8.8 9.1 8.9  Total Protein 6.0 - 8.3 g/dL - - -  Total Bilirubin 0.2 - 1.2 mg/dL - - -  Alkaline Phos 39 - 117 U/L - - -  AST 0 - 37 U/L - - -  ALT 0 - 35 U/L - - -      Imaging:  Within last 24 hrs: No results found.  Assessment    Abscess posterior left ear. Patient Active Problem List   Diagnosis Date Noted   Abscess of external ear, left 08/17/2021   Urinary frequency 04/14/2021   Vitamin B12 deficiency 08/07/2020   Prediabetes 03/07/2020   Tingling in extremities 03/07/2020   Neck pain 11/05/2019   Medicare annual wellness visit, subsequent 10/16/2018   Skin lesion of face 10/16/2018   Benign neoplasm of ascending colon    Benign neoplasm of transverse colon    Melanosis, colon    Other specified diseases of esophagus    Stomach irritation    Constipation 01/24/2018   Compression fracture of L1 vertebra ([place]) 01/24/2018   Anxiety 01/09/2018   Coronary artery calcification seen on CAT scan 11/25/2017   Special screening for malignant neoplasms, colon 11/01/2017   Estrogen deficiency 09/26/2017   Aortic calcification ([place]) 11/30/2016   History of patellar fracture 11/30/2016   Routine general medical examination at a health care facility 06/27/2016   Loss of weight 06/04/2016   History of vertebral compression fracture 01/19/2016   Esophageal dysphagia 06/28/2014   Chronic foot pain 10/27/2011   Low back  pain 04/14/2011   FEVER BLISTER 11/17/2010   Essential hypertension 08/18/2009   BUNION 03/04/2009   Allergic rhinitis 01/10/2008   H/O goiter 02/09/2007   Hypothyroidism 02/09/2007   HYPERCHOLESTEROLEMIA 02/09/2007   History of depression 02/09/2007   OSTEOARTHRITIS 02/09/2007   Osteoporosis 02/09/2007   Disturbance in sleep behavior 02/09/2007    Plan    Incision and drainage abscess posterior left ear. Informed consent was obtained, we discussed avoiding utilization of injectable anesthesia as it appears to be tender. We position the patient in the right decubitus position, having obtained informed consent.  The area was prepped with ChloraPrep. A quick incision was made of approximately 1 cm with a sterile 11 blade.  Drainage was obtained, I encouraged additional drainage with gentle pressure this was not well-tolerated due to the tenderness adjacent.  We applied a dry dressing to the area and secured it with tape which was somewhat challenging but hopefully will protect her clothing. We discussed with her and encouraged her to get the area wet when showering and bathing utilize warm compresses and encourage additional drainage.  We have not used any packing.  I have not excised any overlying skin.  We will have her follow-up in the next week to ensure progress.  Face-to-face time spent with the patient and accompanying care providers(if present) was 30 minutes, with more than 50% of the time spent counseling, educating, and coordinating care of the patient.    These notes generated with voice recognition software. I apologize for typographical errors.  Ronny Bacon M.D., FACS 08/18/2021, 11:26 AM

## 2021-08-18 NOTE — Patient Instructions (Signed)
If you have any concerns or questions, please feel free to call our office. See follow up appointment below.   Incision and Drainage, Care After This sheet gives you information about how to care for yourself after your procedure. Your health care provider may also give you more specific instructions. If you have problems or questions, contact your health care provider. What can I expect after the procedure? After the procedure, it is common to have: Pain or discomfort around the incision site. Blood, fluid, or pus (drainage) from the incision. Redness and firm skin around the incision site. Follow these instructions at home: Medicines Take over-the-counter and prescription medicines only as told by your health care provider. If you were prescribed an antibiotic medicine, use or take it as told by your health care provider. Do not stop using the antibiotic even if you start to feel better. Wound care Follow instructions from your health care provider about how to take care of your wound. Make sure you: Wash your hands with soap and water before and after you change your bandage (dressing). If soap and water are not available, use hand sanitizer. Change your dressing and packing as told by your health care provider. If your dressing is dry or stuck when you try to remove it, moisten or wet the dressing with saline or water so that it can be removed without harming your skin or tissues. If your wound is packed, leave it in place until your health care provider tells you to remove it. To remove the packing, moisten or wet the packing with saline or water so that it can be removed without harming your skin or tissues. Leave stitches (sutures), skin glue, or adhesive strips in place. These skin closures may need to stay in place for 2 weeks or longer. If adhesive strip edges start to loosen and curl up, you may trim the loose edges. Do not remove adhesive strips completely unless your health care  provider tells you to do that. Check your wound every day for signs of infection. Check for: More redness, swelling, or pain. More fluid or blood. Warmth. Pus or a bad smell. If you were sent home with a drain tube in place, follow instructions from your health care provider about: How to empty it. How to care for it at home.  General instructions Rest the affected area. Do not take baths, swim, or use a hot tub until your health care provider approves. Ask your health care provider if you may take showers. You may only be allowed to take sponge baths. Return to your normal activities as told by your health care provider. Ask your health care provider what activities are safe for you. Your health care provider may put you on activity or lifting restrictions. The incision will continue to drain. It is normal to have some clear or slightly bloody drainage. The amount of drainage should lessen each day. Do not apply any creams, ointments, or liquids unless you have been told to by your health care provider. Keep all follow-up visits as told by your health care provider. This is important. Contact a health care provider if: Your cyst or abscess returns. You have a fever or chills. You have more redness, swelling, or pain around your incision. You have more fluid or blood coming from your incision. Your incision feels warm to the touch. You have pus or a bad smell coming from your incision. You have red streaks above or below the incision site. Get help  right away if: You have severe pain or bleeding. You cannot eat or drink without vomiting. You have decreased urine output. You become short of breath. You have chest pain. You cough up blood. The affected area becomes numb or starts to tingle. These symptoms may represent a serious problem that is an emergency. Do not wait to see if the symptoms will go away. Get medical help right away. Call your local emergency services (911 in the  [place] Do not drive yourself to the hospital. Summary After this procedure, it is common to have fluid, blood, or pus coming from the surgery site. Follow all home care instructions. You will be told how to take care of your incision, how to check for infection, and how to take medicines. If you were prescribed an antibiotic medicine, take it as told by your health care provider. Do not stop taking the antibiotic even if you start to feel better. Contact a health care provider if you have increased redness, swelling, or pain around your incision. Get help right away if you have chest pain, you vomit, you cough up blood, or you have shortness of breath. Keep all follow-up visits as told by your health care provider. This is important. This information is not intended to replace advice given to you by your health care provider. Make sure you discuss any questions you have with your health care provider. Document Revised: 07/24/2018 Document Reviewed: 07/24/2018 Elsevier Patient Education  2022 [employer].

## 2021-08-18 NOTE — Chronic Care Management (AMB) (Signed)
° ° °  Chronic Care Management Pharmacy Assistant   Name: ANITRA DOXTATER  MRN: [id number] DOB: 1946-02-04  Reason for Encounter: Reminder Call   Conditions to be addressed/monitored: HTN and HLD    Medications: Outpatient Encounter Medications as of 08/18/2021  Medication Sig   atorvastatin (LIPITOR) 80 MG tablet Take 1 tablet (80 mg total) by mouth daily.   Calcium Carbonate-Vit D-Min (CALCIUM 1200 PO) Take 1 tablet by mouth daily.    cyclobenzaprine (FLEXERIL) 10 MG tablet TAKE 1 TABLET BY MOUTH EVERY DAY AT BEDTIME AS NEEDED   ezetimibe (ZETIA) 10 MG tablet TAKE 1 TABLET EVERY DAY   levothyroxine (SYNTHROID) 75 MCG tablet Take 1 tablet (75 mcg total) by mouth daily before breakfast.   lidocaine (XYLOCAINE) 2 % solution Use as directed 15 mLs in the mouth or throat as needed for mouth pain.   lisinopril (ZESTRIL) 10 MG tablet Take 1 tablet (10 mg total) by mouth daily.   LORazepam (ATIVAN) 1 MG tablet TAKE 1 TABLET AT BEDTIME AS NEEDED FOR SLEEP   nortriptyline (PAMELOR) 75 MG capsule Take 1 capsule (75 mg total) by mouth at bedtime.   sulfamethoxazole-trimethoprim (BACTRIM DS) 800-160 MG tablet Take 1 tablet by mouth 2 (two) times daily.   No facility-administered encounter medications on file as of 08/18/2021.    Frederika L Cantera was contacted to remind her of her upcoming telephone visit with [name] on 08/24/21 at 1:00pm. Patient was reminded to have all medications, supplements and any blood glucose and blood pressure readings available for review at appointment.   Are you having any problems with your medications? No  Do you have any concerns you like to discuss with the pharmacist? No    Star Rating Drugs: Medication:  Last Fill: Day Supply Atorvastatin 80mg  06/29/21 90 Lisinopril 10mg  06/29/21 [facility] notified  Avel Sensor, [place] Assistant [phone]  Total time spent for month CPA: 10 min

## 2021-08-24 ENCOUNTER — Other Ambulatory Visit: Payer: Self-pay

## 2021-08-24 ENCOUNTER — Ambulatory Visit (INDEPENDENT_AMBULATORY_CARE_PROVIDER_SITE_OTHER): Payer: Medicare HMO | Admitting: Pharmacist

## 2021-08-24 DIAGNOSIS — M8000XS Age-related osteoporosis with current pathological fracture, unspecified site, sequela: Secondary | ICD-10-CM

## 2021-08-24 DIAGNOSIS — R350 Frequency of micturition: Secondary | ICD-10-CM

## 2021-08-24 DIAGNOSIS — F419 Anxiety disorder, unspecified: Secondary | ICD-10-CM

## 2021-08-24 DIAGNOSIS — I10 Essential (primary) hypertension: Secondary | ICD-10-CM

## 2021-08-24 DIAGNOSIS — E7800 Pure hypercholesterolemia, unspecified: Secondary | ICD-10-CM

## 2021-08-24 NOTE — Progress Notes (Signed)
Chronic Care Management Pharmacy Note  08/28/2021 Name:  [name] [name] MRN:  [id number] DOB:  Jan 04, 1946  Summary: -Pt had elevated BP reading (188/82) at [facility] last week - of note she was nervous and in pain due to ear abscess at the time. She reports headache and blurred vision for the past few weeks. She does not have a BP monitor at home so cannot check BP. She reports she has f/u appt with surgery 12/20 and 12/27. -Pt has been on Prolia since 11/2017, no repeat DEXA yet  Recommendations/Changes made from today's visit: -No med changes -Recommend repeat DEXA to monitor Prolia efficacy  Plan: -[place] will call patient 12/27 for BP check; if persistently elevated plan to schedule PCP visit to evaluate -Pharmacist follow up televisit scheduled for 3 months   Subjective: [name] [name] is an 75 y.o. year old female who is a primary patient of Tower, Wynelle Fanny, MD.  The CCM team was consulted for assistance with disease management and care coordination needs.    Engaged with patient by telephone for follow up visit in response to provider referral for pharmacy case management and/or care coordination services.   Consent to Services:  The patient was given information about Chronic Care Management services, agreed to services, and gave verbal consent prior to initiation of services.  Please see initial visit note for detailed documentation.   Patient Care Team: Tower, Wynelle Fanny, MD as PCP - General Kerin Ransom Stephannie Li, OD as Consulting Physician (Optometry) Debbora Dus, [facility] as Pharmacist (Pharmacist)  Recent office visits:  08/17/21 FNP Eugenia Pancoast - L ear abscess. Rx'd Bactrim, referred to surgery.  08/11/21 Dr Silvio Pate OV: 3-mo f/u; no change in sleep on lower dose nortriptyline, daytime urinary frequency improved, nocturia no change. Pt prefers to continue with 75 mg.  04/14/21 - PCP visit - Pt presented for hypothyroidism and urinary symptoms. TSH improved,  continue levothyroxine dose. Cholesterol well controlled. B12 is low normal, start B12 shots monthly and take 500 mcg daily OTC. A1c increased to 6.5%, monitor. Try reducing nortriptyline from 100 mg to 75 mg at bedtime to see if urinary retention improves. Follow up 3 months.  09/11/20 - Lab notes - Reduce levothyroxine from 88 mcg to 75 mcg daily. Recheck in about 6 weeks. 08/07/20 - Dr. Glori Bickers, PCP - Patient presented for chronic health conditions. B12 injection today. We will watch A1c, pre-diabetes. Hypothyroidism, update labs. Levothyroxine dose reduced from 100 mcg to 88 mcg.   Recent consult visits:  08/18/21 Dr Christian Mate (Gen surgery): I&D abscess of L ear. [facility] - Patient presented after a fall with a right patella fracture. Start course of hydrocodone 5/325 take 1 tablet every 4 hours as needed.   Hospital visits:  11/16/20- ED - Patient presented after a fall. Start hydrocodone 5/325 take 1 tablet 4 hours as needed. 10/31/20- ED - Patient presented with cough and headache. Started benzonatate 133m take 1 capsule 3 times daily.   Objective:  Lab Results  Component Value Date   CREATININE 0.71 07/14/2021   BUN 18 07/14/2021   GFR 82.93 07/14/2021   GFRNONAA >60 05/11/2019   GFRAA >60 05/11/2019   NA 141 07/14/2021   K 4.0 07/14/2021   CALCIUM 8.8 07/14/2021   CO2 28 07/14/2021   GLUCOSE 100 (H) 07/14/2021    Lab Results  Component Value Date/Time   HGBA1C 6.5 04/14/2021 02:29 PM   HGBA1C 6.3 07/02/2020 09:51 AM   GFR  82.93 07/14/2021 10:26 AM   GFR 72.13 01/05/2021 10:20 AM    Lab Results  Component Value Date   CHOL 157 04/14/2021   HDL 59.90 04/14/2021   LDLCALC 83 04/14/2021   LDLDIRECT 140.9 07/04/2013   TRIG 67.0 04/14/2021   CHOLHDL 3 04/14/2021    Hepatic Function Latest Ref Rng & Units 01/17/2020 02/06/2019 12/26/2018  Total Protein 6.0 - 8.3 g/dL 6.8 6.3 7.4  Albumin 3.5 - 5.2 g/dL 4.2 3.7 3.9  AST 0 - 37 U/L _0 ALT 0 - 35 U/L _1 Alk Phosphatase 39 - 117 U/L 48 48 59  Total Bilirubin 0.2 - 1.2 mg/dL 0.5 0.4 0.5  Bilirubin, Direct 0.0 - 0.3 mg/dL - - -    Lab Results  Component Value Date/Time   TSH 0.42 04/14/2021 02:29 PM   TSH 0.29 (L) 09/11/2020 09:05 AM   FREET4 0.87 05/19/2010 09:04 AM   FREET4 0.5 (L) 05/29/2008 03:52 PM    CBC Latest Ref Rng & Units 05/11/2019 02/06/2019 12/26/2018  WBC 4.0 - 10.5 K/uL 5.0 4.8 3.9(L)  Hemoglobin 12.0 - 15.0 g/dL 14.0 13.5 14.4  Hematocrit 36.0 - 46.0 % 42.9 39.3 43.7  Platelets 150 - 400 K/uL 235 246.0 201    Lab Results  Component Value Date/Time   VD25OH 41.20 01/17/2020 09:55 AM   VD25OH 33.36 02/06/2019 10:56 AM    Clinical ASCVD: Yes  The 10-year ASCVD risk score (Arnett DK, et al., 2019) is: 36.6%   Values used to calculate the score:     Age: 75 years     Sex: Female     Is Non-Hispanic African American: No     Diabetic: No     Tobacco smoker: No     Systolic Blood Pressure: 741 mmHg     Is BP treated: Yes     HDL Cholesterol: 59.9 mg/dL     Total Cholesterol: 157 mg/dL    Depression screen [facility] 2/9 04/07/2021 02/16/2021 12/12/2019  Decreased Interest 0 3 0  Down, Depressed, Hopeless 0 3 1  PHQ - 2 Score 0 6 1  Altered sleeping - 3 0  Tired, decreased energy - 3 0  Change in appetite - 0 0  Feeling bad or failure about yourself  - 0 0  Trouble concentrating - 0 0  Moving slowly or fidgety/restless - 0 0  Suicidal thoughts - 0 0  PHQ-9 Score - 12 1  Difficult doing work/chores - Somewhat difficult Not difficult at all  Some recent data might be hidden    Social History   Tobacco Use  Smoking Status Former   Packs/day: 0.50   Types: Cigarettes   Quit date: 08/07/2019   Years since quitting: 2.0  Smokeless Tobacco Never   BP Readings from Last 3 Encounters:  08/25/21 (!) 179/81  08/18/21 (!) 188/82  08/17/21 (!) 142/86   Pulse Readings from Last 3 Encounters:  08/25/21 69  08/18/21 78  08/17/21 90   Wt Readings from Last 3  Encounters:  08/25/21 110 lb (49.9 kg)  08/18/21 108 lb 6.4 oz (49.2 kg)  08/17/21 108 lb (49 kg)   BMI Readings from Last 3 Encounters:  08/25/21 19.49 kg/m  08/18/21 19.20 kg/m  08/17/21 19.13 kg/m    Assessment/Interventions: Review of patient past medical history, allergies, medications, health status, including review of consultants reports, laboratory and other test data, was performed as part of comprehensive evaluation and provision of chronic care  management services.   SDOH:  (Social Determinants of Health) assessments and interventions performed: Yes   SDOH Screenings   Alcohol Screen: Low Risk    Last Alcohol Screening Score (AUDIT): 0  Depression (PHQ2-9): Low Risk    PHQ-2 Score: 0  Financial Resource Strain: Low Risk    Difficulty of Paying Living Expenses: Not very hard  Food Insecurity: No Food Insecurity   Worried About Charity fundraiser in the Last Year: Never true   Ran Out of Food in the Last Year: Never true  Housing: Low Risk    Last Housing Risk Score: 0  Physical Activity: Inactive   Days of Exercise per Week: 0 days   Minutes of Exercise per Session: 0 min  Social Connections: Not on file  Stress: No Stress Concern Present   Feeling of Stress : Not at all  Tobacco Use: Medium Risk   Smoking Tobacco Use: Former   Smokeless Tobacco Use: Never   Passive Exposure: Not on file  Transportation Needs: No Transportation Needs   Lack of Transportation (Medical): No   Lack of Transportation (Non-Medical): No    CCM Care Plan  Allergies  Allergen Reactions   Fosamax [Alendronate Sodium]     Dysphagia and heartburn    Raloxifene Hcl Other (See Comments)    Leg pain and cramps  Leg pain and cramps     Medications Reviewed Today     Reviewed by Celene Kras, CMA (Certified Medical Assistant) on 08/25/21 at [phone]  Med List Status: <None>   Medication Order Taking? Sig Documenting Provider Last Dose Status Informant  atorvastatin  (LIPITOR) 80 MG tablet [id number] Yes Take 1 tablet (80 mg total) by mouth daily. Tower, Wynelle Fanny, MD Taking Active   Calcium Carbonate-Vit D-Min (CALCIUM 1200 PO) [id number] Yes Take 1 tablet by mouth daily.  [provider] Taking Active Self  cyclobenzaprine (FLEXERIL) 10 MG tablet [id number] Yes TAKE 1 TABLET BY MOUTH EVERY DAY AT BEDTIME AS NEEDED Tower, Wynelle Fanny, MD Taking Active   ezetimibe (ZETIA) 10 MG tablet [id number] Yes TAKE 1 TABLET EVERY DAY Tower, Wynelle Fanny, MD Taking Active   levothyroxine (SYNTHROID) 75 MCG tablet [id number] Yes Take 1 tablet (75 mcg total) by mouth daily before breakfast. Tower, Wynelle Fanny, MD Taking Active   lidocaine (XYLOCAINE) 2 % solution [id number] Yes Use as directed 15 mLs in the mouth or throat as needed for mouth pain. Lavonia Drafts, MD Taking Active   lisinopril (ZESTRIL) 10 MG tablet [id number] Yes Take 1 tablet (10 mg total) by mouth daily. Tower, Wynelle Fanny, MD Taking Active   LORazepam (ATIVAN) 1 MG tablet [id number] Yes TAKE 1 TABLET AT BEDTIME AS NEEDED FOR SLEEP Tower, Wynelle Fanny, MD Taking Active   nortriptyline (PAMELOR) 75 MG capsule [id number] Yes Take 1 capsule (75 mg total) by mouth at bedtime. Abner Greenspan, MD Taking Active   Discontinued 08/25/21 [phone] (Completed Course)             Patient Active Problem List   Diagnosis Date Noted   Abscess of external ear, left 08/17/2021   Urinary frequency 04/14/2021   Vitamin B12 deficiency 08/07/2020   Prediabetes 03/07/2020   Tingling in extremities 03/07/2020   Neck pain 11/05/2019   Ganglion cyst of tendon sheath of right hand 05/17/2019   Medicare annual wellness visit, subsequent 10/16/2018   Skin lesion of face 10/16/2018   Benign neoplasm of ascending colon    Benign neoplasm of transverse colon  Melanosis, colon    Other specified diseases of esophagus    Stomach irritation    Constipation 01/24/2018   Compression fracture of L1 vertebra (HCC) 01/24/2018   Anxiety 01/09/2018    Coronary artery calcification seen on CAT scan 11/25/2017   Special screening for malignant neoplasms, colon 11/01/2017   Estrogen deficiency 09/26/2017   Aortic calcification ([place]) 11/30/2016   History of patellar fracture 11/30/2016   Routine general medical examination at a health care facility 06/27/2016   Loss of weight 06/04/2016   History of vertebral compression fracture 01/19/2016   Esophageal dysphagia 06/28/2014   Chronic foot pain 10/27/2011   Low back pain 04/14/2011   FEVER BLISTER 11/17/2010   Essential hypertension 08/18/2009   BUNION 03/04/2009   Allergic rhinitis 01/10/2008   H/O goiter 02/09/2007   Hypothyroidism 02/09/2007   HYPERCHOLESTEROLEMIA 02/09/2007   History of depression 02/09/2007   OSTEOARTHRITIS 02/09/2007   Osteoporosis 02/09/2007   Disturbance in sleep behavior 02/09/2007    Immunization History  Administered Date(s) Administered   Fluad Quad(high Dose 65+) 08/07/2020, 08/11/2021   Influenza Split 05/24/2012   Influenza Whole 06/16/2007   Influenza,inj,Quad PF,6+ Mos 07/04/2013, 06/28/2014, 07/25/2015, 06/04/2016, 08/31/2017, 10/16/2018   Pneumococcal Conjugate-13 08/08/2015   Pneumococcal Polysaccharide-23 11/10/2007, 06/28/2014   Td 11/10/2007    Conditions to be addressed/monitored:  Hypertension, Hyperlipidemia, Hypothyroidism, Depression, and Osteoporosis  Care Plan : [place]  Updates made by Charlton Haws, [place] since 08/28/2021 12:00 AM     Problem: Hypertension, Hyperlipidemia, Hypothyroidism, Depression, and Osteoporosis   Priority: High  Onset Date: 04/07/2021     Long-Range Goal: Disease mgmt   Start Date: 08/28/2021  Expected End Date: 08/28/2022  This Visit's Progress: On track  Priority: High  Note:   Current Barriers:  Unable to independently monitor therapeutic efficacy  Pharmacist Clinical Goal(s):  Patient will contact provider office for questions/concerns as evidenced notation of same in  electronic health record through collaboration with PharmD and provider.   Interventions: 1:1 collaboration with Tower, Wynelle Fanny, MD regarding development and update of comprehensive plan of care as evidenced by provider attestation and co-signature Inter-disciplinary care team collaboration (see longitudinal plan of care) Comprehensive medication review performed; medication list updated in electronic medical record  Hypertension (BP goal <140/90) -Not ideally controlled - per clinic readings; she had elevated BP at General surgery OV 12/13 (188/82) and has not been able to check at home as she doesn't have a monitor; she is returning for f/u later this month -reports blurred vision, headaches for a couple weeks -Current home readings: none, does not have monitor -Current treatment: Lisinopril 10 mg - 1 tablet daily -Medications previously tried: none reported -Current dietary habits:   -Breakfast - 1 egg, bacon, toast  -Snack - sandwich  -Lunch - baked chicken, pork chops, green beans  -Dinner - meat and country vegetables  -Snacks - pack of crackers  -Drinks - 1 cup of coffee, 3 bottles of water, occasional soda -Current exercise habits: mows her yard, cleans home, flower garden -Educated on BP goals and benefits of medications for prevention of heart attack, stroke and kidney damage; -Recommended to continue current medication; CMA will call 12/27 for repeat BP readings at Gen Surgery f/u visits  Hyperlipidemia: (LDL goal < 70) -Not ideally controlled - LDL 83, continues to improve each year -Hx aortic calcification -Current treatment: Atorvastatin 80 mg - 1 tablet daily (bedtime) Ezetimibe 10 mg - 1 tablet daily (breakfast) -Medications previously tried: none  -  Current dietary patterns: discussed limiting red meats and fatty cuts  -Educated on Cholesterol goals;  -Recommended to continue current medication  Depression/Anxiety/Sleep distubrance (Goal: Improve mood, improve  sleep onset) -Controlled - per patient report  -Mood is stable, but still difficulty with sleep onset; she reports improvement in urinary issues since reducing nortriptyline dose -Current treatment: Lorazepam 1 mg PRN sleep (takes every night) Nortriptyline 75 mg daily HS Cyclobenzaprine 10 mg PRN (takes on occasion for sleep and neck pain) -Medications previously tried/failed: melatonin, antihistamines, Restoril (ineffective) -Recommended to continue current medication;    Osteoporosis (Goal Improve bone density) -Controlled -Patient denies any falls since ED visit 11/16/20 with right patella fracture. She does have some lightheadedness at times. Reports balance is fair. She has a walker but does not always use it. -Last DEXA Scan: 10/2017   T-Score total hip: -3.6  T-Score lumbar spine: -3.4 -Patient is a candidate for pharmacologic treatment due to T-Score < -2.5 in total hip  and T-Score < -2.5 in lumbar spine -Current treatment  Calcium-vitamin D - daily  Prolia 60 mg - Inject every 6 months (started 11/2017, last 07/28/21) -Medications previously tried: alendronate, raloxifene -Recommend weight-bearing and muscle strengthening exercises for building and maintaining bone density. -Recommended to continue current medication; consider repeat DEXA (not done since Prolia started)  Hypothyroidism (Goal: TSH, T4 WNL) -Controlled- TSH 0.42 Aug 2022, on low side of normal -Current treatment  Levothyroxine 75 mcg - 1 tablet daily (takes 30 minutes before breakfast with lisinopril, ezetimibe) -Medications previously tried: none  -Recommend to continue current medication  Patient Goals/Self-Care Activities Patient will:  - take medications as prescribed as evidenced by patient report and record review focus on medication adherence by pill box check blood pressure weekly, document, and provide at future appointments      Medication Assistance: None required.  Patient affirms current  coverage meets needs.  Compliance/Adherence/Medication fill history: Care Gaps: None  Star-Rating Drugs: Medication:                Last Fill:         Day Supply Atorvastatin 47m       06/29/21          90 PDC 100% Lisinopril 150m           06/29/21  90 PDC 100%  [facility] [id number]: [phone]: [phone] pill box? Yes Pt endorses 100% compliance. She takes her meds at the same time daily.  We discussed: Current pharmacy is preferred with insurance plan and patient is satisfied with pharmacy services Patient decided to: Continue current medication management strategy  Care Plan and Follow Up Patient Decision:  Patient agrees to Care Plan and Follow-up.  Follow Up Plan: Telephone follow up appointment with care management team member scheduled for: 3 months   LiCharlene BrookePharmD, BCACP Clinical Pharmacist [phone]

## 2021-08-25 ENCOUNTER — Encounter: Payer: Self-pay | Admitting: Surgery

## 2021-08-25 ENCOUNTER — Other Ambulatory Visit: Payer: Self-pay

## 2021-08-25 ENCOUNTER — Ambulatory Visit (INDEPENDENT_AMBULATORY_CARE_PROVIDER_SITE_OTHER): Payer: Medicare HMO | Admitting: Surgery

## 2021-08-25 VITALS — BP 179/81 | HR 69 | Temp 97.8°F | Ht 63.0 in | Wt 110.0 lb

## 2021-08-25 DIAGNOSIS — H6002 Abscess of left external ear: Secondary | ICD-10-CM

## 2021-08-25 DIAGNOSIS — Z09 Encounter for follow-up examination after completed treatment for conditions other than malignant neoplasm: Secondary | ICD-10-CM

## 2021-08-25 NOTE — Progress Notes (Signed)
Here for follow-up, 1 week status post drainage of a posterior left ear abscess. She is had a small amount of drainage over the last day or 2.  Still having some tenderness. On examination the incision has a small amount of crusting over it, no evidence of fluctuance today.  There is no erythema or induration present.  There is still some residual cyst present.  I encouraged her to continue to utilize warm compresses when she is at rest at home.  She may eventually need definitive excision of the cyst.  We will give her more time for continued resolution of the current process.  We will see her next year.

## 2021-08-25 NOTE — Patient Instructions (Signed)
Continue with the warm compresses. If it continues to be bothersome let us know and we can remove the entire contents.

## 2021-08-28 NOTE — Patient Instructions (Signed)
Visit Information  Phone number for Pharmacist: [phone]   Goals Addressed             This Visit's Progress    Track and Manage My Blood Pressure-Hypertension       Timeframe:  Long-Range Goal Priority:  High Start Date:     08/24/21                        Expected End Date:   08/24/22                    Follow Up Date March 2023   -Obtain a BP monitor for home use (preferably with arm cuff, not wrist) - check blood pressure weekly - choose a place to take my blood pressure (home, clinic or office, retail store) - write blood pressure results in a log or diary    Why is this important?   You won't feel high blood pressure, but it can still hurt your blood vessels.  High blood pressure can cause heart or kidney problems. It can also cause a stroke.  Making lifestyle changes like losing a little weight or eating less salt will help.  Checking your blood pressure at home and at different times of the day can help to control blood pressure.  If the doctor prescribes medicine remember to take it the way the doctor ordered.  Call the office if you cannot afford the medicine or if there are questions about it.     Notes:         Care Plan : CCM Pharmacy Care Plan  Updates made by Charlton Haws, RPH since 08/28/2021 12:00 AM     Problem: Hypertension, Hyperlipidemia, Hypothyroidism, Depression, and Osteoporosis   Priority: High  Onset Date: 04/07/2021     Long-Range Goal: Disease mgmt   Start Date: 08/28/2021  Expected End Date: 08/28/2022  This Visit's Progress: On track  Priority: High  Note:   Current Barriers:  Unable to independently monitor therapeutic efficacy  Pharmacist Clinical Goal(s):  Patient will contact provider office for questions/concerns as evidenced notation of same in electronic health record through collaboration with PharmD and provider.   Interventions: 1:1 collaboration with Tower, Wynelle Fanny, MD regarding development and update of  comprehensive plan of care as evidenced by provider attestation and co-signature Inter-disciplinary care team collaboration (see longitudinal plan of care) Comprehensive medication review performed; medication list updated in electronic medical record  Hypertension (BP goal <140/90) -Not ideally controlled - per clinic readings; she had elevated BP at General surgery OV 12/13 (188/82) and has not been able to check at home as she doesn't have a monitor; she is returning for f/u later this month -reports blurred vision, headaches for a couple weeks -Current home readings: none, does not have monitor -Current treatment: Lisinopril 10 mg - 1 tablet daily -Medications previously tried: none reported -Current dietary habits:   -Breakfast - 1 egg, bacon, toast  -Snack - sandwich  -Lunch - baked chicken, pork chops, green beans  -Dinner - meat and country vegetables  -Snacks - pack of crackers  -Drinks - 1 cup of coffee, 3 bottles of water, occasional soda -Current exercise habits: mows her yard, cleans home, flower garden -Educated on BP goals and benefits of medications for prevention of heart attack, stroke and kidney damage; -Recommended to continue current medication; CMA will call 12/27 for repeat BP readings at Gen Surgery f/u visits  Hyperlipidemia: (LDL goal <  70) -Not ideally controlled - LDL 83, continues to improve each year -Hx aortic calcification -Current treatment: Atorvastatin 80 mg - 1 tablet daily (bedtime) Ezetimibe 10 mg - 1 tablet daily (breakfast) -Medications previously tried: none  -Current dietary patterns: discussed limiting red meats and fatty cuts  -Educated on Cholesterol goals;  -Recommended to continue current medication  Depression/Anxiety/Sleep distubrance (Goal: Improve mood, improve sleep onset) -Controlled - per patient report  -Mood is stable, but still difficulty with sleep onset; she reports improvement in urinary issues since reducing nortriptyline  dose -Current treatment: Lorazepam 1 mg PRN sleep (takes every night) Nortriptyline 75 mg daily HS Cyclobenzaprine 10 mg PRN (takes on occasion for sleep and neck pain) -Medications previously tried/failed: melatonin, antihistamines, Restoril (ineffective) -Recommended to continue current medication;    Osteoporosis (Goal Improve bone density) -Controlled -Patient denies any falls since ED visit 11/16/20 with right patella fracture. She does have some lightheadedness at times. Reports balance is fair. She has a walker but does not always use it. -Last DEXA Scan: 10/2017   T-Score total hip: -3.6  T-Score lumbar spine: -3.4 -Patient is a candidate for pharmacologic treatment due to T-Score < -2.5 in total hip  and T-Score < -2.5 in lumbar spine -Current treatment  Calcium-vitamin D - daily  Prolia 60 mg - Inject every 6 months (started 11/2017, last 07/28/21) -Medications previously tried: alendronate, raloxifene -Recommend weight-bearing and muscle strengthening exercises for building and maintaining bone density. -Recommended to continue current medication; consider repeat DEXA (not done since Prolia started)  Hypothyroidism (Goal: TSH, T4 WNL) -Controlled- TSH 0.42 Aug 2022, on low side of normal -Current treatment  Levothyroxine 75 mcg - 1 tablet daily (takes 30 minutes before breakfast with lisinopril, ezetimibe) -Medications previously tried: none  -Recommend to continue current medication  Patient Goals/Self-Care Activities Patient will:  - take medications as prescribed as evidenced by patient report and record review focus on medication adherence by pill box check blood pressure weekly, document, and provide at future appointments      The patient verbalized understanding of instructions, educational materials, and care plan provided today and declined offer to receive copy of patient instructions, educational materials, and care plan.  Telephone follow up appointment with  pharmacy team member scheduled for: 3 months  [name], PharmD, [facility] Clinical Pharmacist [place] [facility] [place] [phone]

## 2021-09-01 ENCOUNTER — Ambulatory Visit: Payer: Medicare HMO | Admitting: Surgery

## 2021-09-05 DIAGNOSIS — E7800 Pure hypercholesterolemia, unspecified: Secondary | ICD-10-CM

## 2021-09-05 DIAGNOSIS — M8000XD Age-related osteoporosis with current pathological fracture, unspecified site, subsequent encounter for fracture with routine healing: Secondary | ICD-10-CM

## 2021-09-05 DIAGNOSIS — I10 Essential (primary) hypertension: Secondary | ICD-10-CM | POA: Diagnosis not present

## 2021-09-08 ENCOUNTER — Telehealth: Payer: Self-pay | Admitting: Pharmacist

## 2021-09-08 MED ORDER — TRAZODONE HCL 50 MG PO TABS: 25.0000 mg | ORAL_TABLET | Freq: Every evening | ORAL | 0 refills | Status: DC | PRN

## 2021-09-08 NOTE — Telephone Encounter (Signed)
BP Readings from Last 3 Encounters:  08/25/21 (!) 179/81  08/18/21 (!) 188/82  08/17/21 (!) 142/86    Patient's BP has been elevated at last few office visits (she saw Gen Surgery for ear abscess 12/13 and 12/20). She does not have a BP monitor at home but she checked her BP at daughter's house 09/06/21: 137/79. BP is at goal, encouraged pt to continue weekly monitoring of BP, no further action required.  Patient mentioned that she has not been sleeping well. She is currently taking lorazepam 1 mg and nortriptyline 75 mg nightly. Nortriptyline was recently reduced from 100 to 75 mg, but pt reports sleep quality was unchanged with this dose reduction. She has failed melatonin trial.   I recommend a trial of trazodone 25-50 mg at bedtime. Will consult with PCP.

## 2021-09-08 NOTE — Telephone Encounter (Signed)
-----   Message from [place], [place] sent at 09/01/2021 12:49 PM EST ----- Regarding: RE: BP call Contacted the patient and her last office visit BP was 180/82. That was 4 days ago, I encouraged her to purchase a monitor, and or try to go get it checked at her local pharmacy and write it down and we will check on her in January ,  Velmena Humble CMA ----- Message ----- From: Charlton Haws, [facility] Sent: 09/01/2021  10:00 AM EST To: Avel Sensor, CMA Subject: BP call                                        Please check chart for updated BP  - she should have surgery f/u appt at 9am. If It is still high (>150/90), please call patient and recommend she get a BP monitor for home use and plan to call again in January for readings.

## 2021-09-08 NOTE — Telephone Encounter (Signed)
I sent it to her mail order pharmacy  Caution of sedation  Let us know if any problems or side effects   Keep watching the blood pressure

## 2021-09-08 NOTE — Addendum Note (Signed)
Addended by: Loura Pardon A on: 09/08/2021 12:31 PM   Modules accepted: Orders

## 2021-09-09 NOTE — Telephone Encounter (Signed)
Pt notified of Dr. Tower's comments and that Rx was sent to pharmacy  

## 2021-09-15 ENCOUNTER — Other Ambulatory Visit: Payer: Self-pay

## 2021-09-15 ENCOUNTER — Ambulatory Visit (INDEPENDENT_AMBULATORY_CARE_PROVIDER_SITE_OTHER): Payer: Medicare HMO

## 2021-09-15 DIAGNOSIS — E538 Deficiency of other specified B group vitamins: Secondary | ICD-10-CM

## 2021-09-15 MED ORDER — CYANOCOBALAMIN 1000 MCG/ML IJ SOLN
1000.0000 ug | Freq: Once | INTRAMUSCULAR | Status: AC
  Administered 2021-09-15: 1000 ug via INTRAMUSCULAR

## 2021-09-15 NOTE — Progress Notes (Signed)
Per orders of Dr. Tower,monthly injection of B12 given by Shravya Wickwire G Dameir Gentzler. Patient tolerated injection well.  

## 2021-09-28 ENCOUNTER — Telehealth: Payer: Self-pay

## 2021-09-28 NOTE — Chronic Care Management (AMB) (Signed)
Chronic Care Management Pharmacy Assistant   Name: [name] [name]  MRN: [id number] DOB: [birth year]  Reason for Encounter:Hypertension  Disease State    Recent office visits:  09/15/21-Family Medicine -B-12 shot  Recent consult visits:  08/25/21-General Surgery- Sharlet Salina Rodenberg,MD-Patient presented for post op left ear abcess.Continue warm compress. No medication changes  Hospital visits:  None in previous 6 months  Medications: Outpatient Encounter Medications as of 09/28/2021  Medication Sig   atorvastatin (LIPITOR) 80 MG tablet Take 1 tablet (80 mg total) by mouth daily.   Calcium Carbonate-Vit D-Min (CALCIUM 1200 PO) Take 1 tablet by mouth daily.    cyclobenzaprine (FLEXERIL) 10 MG tablet TAKE 1 TABLET BY MOUTH EVERY DAY AT BEDTIME AS NEEDED   ezetimibe (ZETIA) 10 MG tablet TAKE 1 TABLET EVERY DAY   levothyroxine (SYNTHROID) 75 MCG tablet Take 1 tablet (75 mcg total) by mouth daily before breakfast.   lidocaine (XYLOCAINE) 2 % solution Use as directed 15 mLs in the mouth or throat as needed for mouth pain.   lisinopril (ZESTRIL) 10 MG tablet Take 1 tablet (10 mg total) by mouth daily.   LORazepam (ATIVAN) 1 MG tablet TAKE 1 TABLET AT BEDTIME AS NEEDED FOR SLEEP   nortriptyline (PAMELOR) 75 MG capsule Take 1 capsule (75 mg total) by mouth at bedtime.   traZODone (DESYREL) 50 MG tablet Take 0.5-1 tablets (25-50 mg total) by mouth at bedtime as needed for sleep.   No facility-administered encounter medications on file as of 09/28/2021.     Recent Office Vitals: BP Readings from Last 3 Encounters:  08/25/21 (!) 179/81  08/18/21 (!) 188/82  08/17/21 (!) 142/86   Pulse Readings from Last 3 Encounters:  08/25/21 69  08/18/21 78  08/17/21 90    Wt Readings from Last 3 Encounters:  08/25/21 110 lb (49.9 kg)  08/18/21 108 lb 6.4 oz (49.2 kg)  08/17/21 108 lb (49 kg)     Kidney Function Lab Results  Component Value Date/Time   CREATININE 0.71 07/14/2021 10:26 AM    CREATININE 0.80 01/05/2021 10:20 AM   CREATININE 0.72 12/13/2013 07:04 PM   CREATININE 0.78 12/11/2013 07:24 PM   GFR 82.93 07/14/2021 10:26 AM   GFRNONAA >60 05/11/2019 09:14 AM   GFRNONAA >60 12/13/2013 07:04 PM   GFRAA >60 05/11/2019 09:14 AM   GFRAA >60 12/13/2013 07:04 PM    BMP Latest Ref Rng & Units 07/14/2021 01/05/2021 07/02/2020  Glucose 70 - 99 mg/dL 100(H) 96 91  BUN 6 - 23 mg/dL 18 13 10   Creatinine 0.40 - 1.20 mg/dL 0.71 0.80 0.64  Sodium 135 - 145 mEq/L 141 140 141  Potassium 3.5 - 5.1 mEq/L 4.0 4.0 3.8  Chloride 96 - 112 mEq/L 105 105 103  CO2 19 - 32 mEq/L 28 29 30   Calcium 8.4 - 10.5 mg/dL 8.8 9.1 8.9     Contacted patient on 09/28/21 to discuss hypertension disease state  Current antihypertensive regimen:   Lisinopril 10mg - take 1 tablet daily   Patient verbally confirms she is taking the above medications as directed. Yes The patient reports she does not miss a dose.  How often are you checking your Blood Pressure? infrequently The patient does not have a home monitor, she will check when she goes out to pharmacy. The patient reports she purchased a new BP unit and she took her BP in the morning after morning medications and breakfast.  DATE:  BP               PULSE 09/30/21  143/65  - 10/01/21  177/87  - 10/02/21  101/77  -   Wrist or arm cuff:  Patient just purchased a home monitor 09/29/21 [employer] brand arm cuff Caffeine intake:  1 cup regular coffee in the am  Salt intake: limits her intake Over the counter medications including pseudoephedrine or NSAIDs?  Any readings above 180/120? No   What recent interventions/DTPs have been made by any provider to improve Blood Pressure control since last CPP Visit:  purchase of new home monitor will help the patient identify any concerns she might have. New [employer] brand BP unit purchased 09/29/21.  Any recent hospitalizations or ED visits since last visit with CPP? No  What diet changes have been made to  improve Blood Pressure Control?   Limits adding salt to foods   What exercise is being done to improve your Blood Pressure Control?   None identified  Adherence Review: Is the patient currently on ACE/ARB medication? Yes Does the patient have >5 day gap between last estimated fill dates? No   Star Rating Drugs:  Medication:  Last Fill: Day Supply Lisinopril 10mg  06/29/21 90  Patient states she does not miss any dose  Atorvastatin 80mg  06/29/21 90   Care Gaps: Annual wellness visit in last year? No Most Recent BP reading:179/81  08/25/21    Upcoming appointments: PCP appointment on 10/20/21   B12 shot  Mendel Ryder [facility] CPP notified  Avel Sensor, [place] Assistant [phone]  Total time spent for month CPA: 30 min

## 2021-10-05 DIAGNOSIS — H524 Presbyopia: Secondary | ICD-10-CM | POA: Diagnosis not present

## 2021-10-12 ENCOUNTER — Other Ambulatory Visit: Payer: Self-pay | Admitting: Family Medicine

## 2021-10-20 ENCOUNTER — Ambulatory Visit: Payer: Medicare HMO

## 2021-10-22 ENCOUNTER — Ambulatory Visit: Payer: Medicare HMO

## 2021-11-03 ENCOUNTER — Ambulatory Visit (INDEPENDENT_AMBULATORY_CARE_PROVIDER_SITE_OTHER): Payer: Medicare HMO

## 2021-11-03 ENCOUNTER — Other Ambulatory Visit: Payer: Self-pay

## 2021-11-03 DIAGNOSIS — E538 Deficiency of other specified B group vitamins: Secondary | ICD-10-CM | POA: Diagnosis not present

## 2021-11-03 MED ORDER — CYANOCOBALAMIN 1000 MCG/ML IJ SOLN
1000.0000 ug | Freq: Once | INTRAMUSCULAR | Status: AC
  Administered 2021-11-03: 1000 ug via INTRAMUSCULAR

## 2021-11-03 NOTE — Progress Notes (Signed)
Per orders of Dr. Glori Bickers, injection of monthly B12 1000 mcg/ml given by Pilar Grammes, CMA in right deltoid.  Patient tolerated injection well.

## 2021-11-05 ENCOUNTER — Telehealth: Payer: Self-pay

## 2021-11-05 NOTE — Progress Notes (Signed)
? ? ?  Chronic Care Management ?Pharmacy Assistant  ? ?Name: [name] [name]  MRN: [id number] DOB: Jul 19, 1946 ? ?Reason for Encounter: CCM Counsellor) ?  ?Medications: ?Outpatient Encounter Medications as of 11/05/2021  ?Medication Sig  ? nortriptyline (PAMELOR) 75 MG capsule TAKE 1 CAPSULE AT BEDTIME  ? atorvastatin (LIPITOR) 80 MG tablet Take 1 tablet (80 mg total) by mouth daily.  ? Calcium Carbonate-Vit D-Min (CALCIUM 1200 PO) Take 1 tablet by mouth daily.   ? cyclobenzaprine (FLEXERIL) 10 MG tablet TAKE 1 TABLET BY MOUTH EVERY DAY AT BEDTIME AS NEEDED  ? ezetimibe (ZETIA) 10 MG tablet TAKE 1 TABLET EVERY DAY  ? levothyroxine (SYNTHROID) 75 MCG tablet Take 1 tablet (75 mcg total) by mouth daily before breakfast.  ? lidocaine (XYLOCAINE) 2 % solution Use as directed 15 mLs in the mouth or throat as needed for mouth pain.  ? lisinopril (ZESTRIL) 10 MG tablet Take 1 tablet (10 mg total) by mouth daily.  ? LORazepam (ATIVAN) 1 MG tablet TAKE 1 TABLET AT BEDTIME AS NEEDED FOR SLEEP  ? traZODone (DESYREL) 50 MG tablet Take 0.5-1 tablets (25-50 mg total) by mouth at bedtime as needed for sleep.  ? ?No facility-administered encounter medications on file as of 11/05/2021.  ? ?[name] [name] was contacted to remind of upcoming telephone visit with [name] on 11/09/2021 at 3:45 pm. Patient was reminded to have all medications, supplements and any blood glucose and blood pressure readings available for review at appointment. If unable to reach, a voicemail was left for patient.  ? ?Star Rating Drugs: ?Medication:  Last Fill: Day Supply ?Lisinopril 10 mg 06/29/2021 90  ?Atorvastatin 80 mg 10/16/2021 90  ?Fill dates verified with [facility] ? ?[name], CPP notified ? ?[name], RMA ?Clinical Pharmacy Assistant ?[phone] ? ?Time Spent: 10 Minutes ? ? ? ?

## 2021-11-09 ENCOUNTER — Telehealth: Payer: Self-pay | Admitting: Pharmacist

## 2021-11-09 ENCOUNTER — Other Ambulatory Visit: Payer: Self-pay | Admitting: Family Medicine

## 2021-11-09 ENCOUNTER — Other Ambulatory Visit: Payer: Self-pay

## 2021-11-09 ENCOUNTER — Ambulatory Visit (INDEPENDENT_AMBULATORY_CARE_PROVIDER_SITE_OTHER): Payer: Medicare HMO | Admitting: Pharmacist

## 2021-11-09 DIAGNOSIS — G479 Sleep disorder, unspecified: Secondary | ICD-10-CM

## 2021-11-09 DIAGNOSIS — E7800 Pure hypercholesterolemia, unspecified: Secondary | ICD-10-CM

## 2021-11-09 DIAGNOSIS — F419 Anxiety disorder, unspecified: Secondary | ICD-10-CM

## 2021-11-09 DIAGNOSIS — M8000XS Age-related osteoporosis with current pathological fracture, unspecified site, sequela: Secondary | ICD-10-CM

## 2021-11-09 DIAGNOSIS — I10 Essential (primary) hypertension: Secondary | ICD-10-CM

## 2021-11-09 DIAGNOSIS — M810 Age-related osteoporosis without current pathological fracture: Secondary | ICD-10-CM

## 2021-11-09 MED ORDER — LISINOPRIL 20 MG PO TABS: 20.0000 mg | ORAL_TABLET | Freq: Every day | ORAL | 0 refills | Status: DC

## 2021-11-09 NOTE — Telephone Encounter (Signed)
That sounds fine, thanks.  Do you need me to send that in or do you do it? ?

## 2021-11-09 NOTE — Telephone Encounter (Signed)
Ordered Rx to mail order. Patient informed and agrees with plan. ?

## 2021-11-09 NOTE — Patient Instructions (Signed)
Visit Information ? ?Phone number for Pharmacist: [phone] ? ? Goals Addressed   ? ?  ?  ?  ?  ? This Visit's Progress  ?  Track and Manage My Blood Pressure-Hypertension     ?  Timeframe:  Long-Range Goal ?Priority:  High ?Start Date:     08/24/21                        ?Expected End Date:   08/24/22                   ? ?Follow Up Date May 2023 ?  ?-Obtain a BP monitor for home use (preferably with arm cuff, not wrist) ?- check blood pressure weekly ?- choose a place to take my blood pressure (home, clinic or office, retail store) ?- write blood pressure results in a log or diary  ?  ?Why is this important?   ?You won't feel high blood pressure, but it can still hurt your blood vessels.  ?High blood pressure can cause heart or kidney problems. It can also cause a stroke.  ?Making lifestyle changes like losing a little weight or eating less salt will help.  ?Checking your blood pressure at home and at different times of the day can help to control blood pressure.  ?If the doctor prescribes medicine remember to take it the way the doctor ordered.  ?Call the office if you cannot afford the medicine or if there are questions about it.   ?  ?Notes:  ?  ? ?  ? ? ?Care Plan : [place]  ?Updates made by Charlton Haws, [place] since 11/09/2021 12:00 AM  ?  ? ?Problem: Hypertension, Hyperlipidemia, Hypothyroidism, Depression, and Osteoporosis   ?Priority: High  ?Onset Date: 04/07/2021  ?  ? ?Long-Range Goal: Disease mgmt   ?Start Date: 08/28/2021  ?Expected End Date: 08/28/2022  ?This Visit's Progress: Not on track  ?Recent Progress: On track  ?Priority: High  ?Note:   ?Current Barriers:  ?Unable to independently monitor therapeutic efficacy ? ?Pharmacist Clinical Goal(s):  ?Patient will contact provider office for questions/concerns as evidenced notation of same in electronic health record through collaboration with PharmD and provider.  ? ?Interventions: ?1:1 collaboration with Tower, Wynelle Fanny, MD regarding  development and update of comprehensive plan of care as evidenced by provider attestation and co-signature ?Inter-disciplinary care team collaboration (see longitudinal plan of care) ?Comprehensive medication review performed; medication list updated in electronic medical record ? ?Hypertension (BP goal <140/90) ?-Not ideally controlled - BP elevated at home per patient report ?-Current home BP readings: 141/64, 146/42, 191/82, 151/61, 157/58 ?-Current treatment: ?Lisinopril 10 mg daily - Appropriate, Query Effective ?-Medications previously tried: none reported ?-Current dietary habits:  ? -Breakfast - 1 egg, bacon, toast ? -Snack - sandwich ? -Lunch - baked chicken, pork chops, green beans ? -Dinner - meat and country vegetables ? -Snacks - pack of crackers ? -Drinks - 1 cup of coffee, 3 bottles of water, occasional soda ?-Current exercise habits: mows her yard, cleans home, flower garden ?-Educated on BP goals and benefits of medications for prevention of heart attack, stroke and kidney damage; ?-Recommend to increase lisinopril to 20 mg daily ? ?Hyperlipidemia: (LDL goal < 70) ?-Not ideally controlled - LDL 83, continues to improve each year ?-Hx aortic calcification ?-Current treatment: ?Atorvastatin 80 mg daily HS - Appropriate, Query Effective, Safe, Accessible ?Ezetimibe 10 mg daily AM -Appropriate, Query Effective, Safe, Accessible ?-Medications  previously tried: none  ?-Current dietary patterns: discussed limiting red meats and fatty cuts  ?-Educated on Cholesterol goals;  ?-Recommended to continue current medication ? ?Depression/Anxiety/Sleep distubrance (Goal: Improve mood, improve sleep onset) ?-Not ideally controlled - per patient report, she is not sleeping well; she reports trying trazodone for a few days but did not see improvement so she stopped ?-Mood is stable, but still difficulty with sleep onset; she reports improvement in urinary issues since reducing nortriptyline dose ?-Current  treatment: ?Lorazepam 1 mg PRN sleep (takes every night) ?Nortriptyline 75 mg daily HS ?Cyclobenzaprine 10 mg PRN (takes on occasion for sleep and neck pain) ?Trazodone 50 mg HS prn ?-Medications previously tried/failed: melatonin, antihistamines, Restoril (ineffective) ?-Recommend increasing trazodone to 100 mg (2 tablets) at bedtime; monitor for sedation ? ?Osteoporosis (Goal Improve bone density) ?-Controlled ?-Patient denies any falls since ED visit 11/16/20 with right patella fracture. She does have some lightheadedness at times. Reports balance is fair. She has a walker but does not always use it. ?-Last DEXA Scan: 10/2017  ? T-Score total hip: -3.6 ? T-Score lumbar spine: -3.4 ?-Patient is a candidate for pharmacologic treatment due to T-Score < -2.5 in total hip  and T-Score < -2.5 in lumbar spine ?-Current treatment  ?Calcium-vitamin D - daily  ?Prolia 60 mg q6 mos (started 11/2017, last 07/28/21) ?-Medications previously tried: alendronate, raloxifene ?-Recommend weight-bearing and muscle strengthening exercises for building and maintaining bone density. ?-Recommended to continue current medication; consider repeat DEXA (not done since Prolia started) ? ?Hypothyroidism (Goal: TSH, T4 WNL) ?-Controlled- TSH 0.42 Aug 2022, on low side of normal ?-Current treatment  ?Levothyroxine 75 mcg daily (takes 30 minutes before breakfast with lisinopril, ezetimibe) ?-Medications previously tried: none  ?-Recommend to continue current medication ? ?Patient Goals/Self-Care Activities ?Patient will:  ?- take medications as prescribed as evidenced by patient report and record review ?focus on medication adherence by pill box ?check blood pressure weekly, document, and provide at future appointments ?  ?  ? ?The patient verbalized understanding of instructions, educational materials, and care plan provided today and declined offer to receive copy of patient instructions, educational materials, and care plan.  ?Telephone follow  up appointment with pharmacy team member scheduled for: 2 months ? ?[name], PharmD, BCACP ?Clinical Pharmacist ?[facility] ?[phone] ?  ?

## 2021-11-09 NOTE — Telephone Encounter (Signed)
Patient reports elevated home BP over the last few weeks (not checking every day): 141/64, 146/42, 191/82, 151/61, 157/58 ? ?She is currently taking lisinopril 10 mg. BP goal < 140/90. Kidney function WNL at last check 07/2021. ? ?Recommendation: ?Increase lisinopril to 20 mg (2 tablets) daily ? ?Forwarding to PCP for approval. ? ?Lab Results  ?Component Value Date  ? CREATININE 0.71 07/14/2021  ? BUN 18 07/14/2021  ? GFR 82.93 07/14/2021  ? GFRNONAA >60 05/11/2019  ? GFRAA >60 05/11/2019  ? NA 141 07/14/2021  ? K 4.0 07/14/2021  ? CALCIUM 8.8 07/14/2021  ? CO2 28 07/14/2021  ? ? ?

## 2021-11-09 NOTE — Progress Notes (Signed)
Chronic Care Management Pharmacy Note  11/09/2021 Name:  [name] [name] MRN:  [id number] DOB:  May 27, 1946  Summary: CCM F/U phone visit -Pt reports elevated BP at home: 141/64, 146/42, 191/82, 151/61, 157/58 -Pt reports minimal benefit with trazodone 50 mg HS so she stopped taking it -Pt has been on Prolia since 11/2017, no repeat DEXA yet  Recommendations/Changes made from today's visit: -Recommend increasing lisinopril to 20 mg (2 tablets) daily - see phone note -Recommend trial of trazodone 100 mg (2 tablets) at bedtime; monitor for sedation -Recommend repeat DEXA to monitor Prolia efficacy  Plan: -[place] will call patient 1 month for BP log -Pharmacist follow up televisit scheduled for 2 months -PCP 6-mo f/u 02/09/22   Subjective: [name] [name] is an 76 y.o. year old female who is a primary patient of Tower, Wynelle Fanny, MD.  The CCM team was consulted for assistance with disease management and care coordination needs.    Engaged with patient by telephone for follow up visit in response to provider referral for pharmacy case management and/or care coordination services.   Consent to Services:  The patient was given information about Chronic Care Management services, agreed to services, and gave verbal consent prior to initiation of services.  Please see initial visit note for detailed documentation.   Patient Care Team: Tower, Wynelle Fanny, MD as PCP - General Kerin Ransom Stephannie Li, OD as Consulting Physician (Optometry) Debbora Dus, [facility] as Pharmacist (Pharmacist)  Recent office visits:  08/17/21 FNP Eugenia Pancoast - L ear abscess. Rx'd Bactrim, referred to surgery.  08/11/21 Dr Silvio Pate OV: 3-mo f/u; no change in sleep on lower dose nortriptyline, daytime urinary frequency improved, nocturia no change. Pt prefers to continue with 75 mg.  04/14/21 - PCP visit - Pt presented for hypothyroidism and urinary symptoms. TSH improved, continue levothyroxine dose. Cholesterol well  controlled. B12 is low normal, start B12 shots monthly and take 500 mcg daily OTC. A1c increased to 6.5%, monitor. Try reducing nortriptyline from 100 mg to 75 mg at bedtime to see if urinary retention improves. Follow up 3 months.  09/11/20 - Lab notes - Reduce levothyroxine from 88 mcg to 75 mcg daily. Recheck in about 6 weeks. 08/07/20 - Dr. Glori Bickers, PCP - Patient presented for chronic health conditions. B12 injection today. We will watch A1c, pre-diabetes. Hypothyroidism, update labs. Levothyroxine dose reduced from 100 mcg to 88 mcg.   Recent consult visits:  08/18/21 Dr Christian Mate (Gen surgery): I&D abscess of L ear. [facility]/22[facility] - Patient presented after a fall with a right patella fracture. Start course of hydrocodone 5/325 take 1 tablet every 4 hours as needed.   Hospital visits:  11/16/20- ED - Patient presented after a fall. Start hydrocodone 5/325 take 1 tablet 4 hours as needed. 10/31/20- ED - Patient presented with cough and headache. Started benzonatate 173m take 1 capsule 3 times daily.   Objective:  Lab Results  Component Value Date   CREATININE 0.71 07/14/2021   BUN 18 07/14/2021   GFR 82.93 07/14/2021   GFRNONAA >60 05/11/2019   GFRAA >60 05/11/2019   NA 141 07/14/2021   K 4.0 07/14/2021   CALCIUM 8.8 07/14/2021   CO2 28 07/14/2021   GLUCOSE 100 (H) 07/14/2021    Lab Results  Component Value Date/Time   HGBA1C 6.5 04/14/2021 02:29 PM   HGBA1C 6.3 07/02/2020 09:51 AM   GFR 82.93 07/14/2021 10:26 AM   GFR 72.13 01/05/2021 10:20 AM    Lab Results  Component Value Date   CHOL 157 04/14/2021   HDL 59.90 04/14/2021   LDLCALC 83 04/14/2021   LDLDIRECT 140.9 07/04/2013   TRIG 67.0 04/14/2021   CHOLHDL 3 04/14/2021    Hepatic Function Latest Ref Rng & Units 01/17/2020 02/06/2019 12/26/2018  Total Protein 6.0 - 8.3 g/dL 6.8 6.3 7.4  Albumin 3.5 - 5.2 g/dL 4.2 3.7 3.9  AST 0 - 37 U/L 19 19 25   ALT 0 - 35 U/L 13 12 18   Alk Phosphatase 39 - 117 U/L 48 48 59   Total Bilirubin 0.2 - 1.2 mg/dL 0.5 0.4 0.5  Bilirubin, Direct 0.0 - 0.3 mg/dL - - -    Lab Results  Component Value Date/Time   TSH 0.42 04/14/2021 02:29 PM   TSH 0.29 (L) 09/11/2020 09:05 AM   FREET4 0.87 05/19/2010 09:04 AM   FREET4 0.5 (L) 05/29/2008 03:52 PM    CBC Latest Ref Rng & Units 05/11/2019 02/06/2019 12/26/2018  WBC 4.0 - 10.5 K/uL 5.0 4.8 3.9(L)  Hemoglobin 12.0 - 15.0 g/dL 14.0 13.5 14.4  Hematocrit 36.0 - 46.0 % 42.9 39.3 43.7  Platelets 150 - 400 K/uL 235 246.0 201    Lab Results  Component Value Date/Time   VD25OH 41.20 01/17/2020 09:55 AM   VD25OH 33.36 02/06/2019 10:56 AM    Clinical ASCVD: Yes  The 10-year ASCVD risk score (Arnett DK, et al., 2019) is: 40.4%   Values used to calculate the score:     Age: 22 years     Sex: Female     Is Non-Hispanic African American: No     Diabetic: No     Tobacco smoker: No     Systolic Blood Pressure: 373 mmHg     Is BP treated: Yes     HDL Cholesterol: 59.9 mg/dL     Total Cholesterol: 157 mg/dL    Depression screen [facility] 2/9 04/07/2021 02/16/2021 12/12/2019  Decreased Interest 0 3 0  Down, Depressed, Hopeless 0 3 1  PHQ - 2 Score 0 6 1  Altered sleeping - 3 0  Tired, decreased energy - 3 0  Change in appetite - 0 0  Feeling bad or failure about yourself  - 0 0  Trouble concentrating - 0 0  Moving slowly or fidgety/restless - 0 0  Suicidal thoughts - 0 0  PHQ-9 Score - 12 1  Difficult doing work/chores - Somewhat difficult Not difficult at all  Some recent data might be hidden    Social History   Tobacco Use  Smoking Status Former   Packs/day: 0.50   Types: Cigarettes   Quit date: 08/07/2019   Years since quitting: 2.2  Smokeless Tobacco Never   BP Readings from Last 3 Encounters:  08/25/21 (!) 179/81  08/18/21 (!) 188/82  08/17/21 (!) 142/86   Pulse Readings from Last 3 Encounters:  08/25/21 69  08/18/21 78  08/17/21 90   Wt Readings from Last 3 Encounters:  08/25/21 110 lb (49.9 kg)  08/18/21  108 lb 6.4 oz (49.2 kg)  08/17/21 108 lb (49 kg)   BMI Readings from Last 3 Encounters:  08/25/21 19.49 kg/m  08/18/21 19.20 kg/m  08/17/21 19.13 kg/m    Assessment/Interventions: Review of patient past medical history, allergies, medications, health status, including review of consultants reports, laboratory and other test data, was performed as part of comprehensive evaluation and provision of chronic care management services.   SDOH:  (Social Determinants of Health) assessments and interventions performed: Yes  SDOH Screenings   Alcohol Screen: Low Risk    Last Alcohol Screening Score (AUDIT): 0  Depression (PHQ2-9): Low Risk    PHQ-2 Score: 0  Financial Resource Strain: Low Risk    Difficulty of Paying Living Expenses: Not very hard  Food Insecurity: No Food Insecurity   Worried About Charity fundraiser in the Last Year: Never true   Ran Out of Food in the Last Year: Never true  Housing: Low Risk    Last Housing Risk Score: 0  Physical Activity: Inactive   Days of Exercise per Week: 0 days   Minutes of Exercise per Session: 0 min  Social Connections: Not on file  Stress: No Stress Concern Present   Feeling of Stress : Not at all  Tobacco Use: Medium Risk   Smoking Tobacco Use: Former   Smokeless Tobacco Use: Never   Passive Exposure: Not on file  Transportation Needs: No Transportation Needs   Lack of Transportation (Medical): No   Lack of Transportation (Non-Medical): No    CCM Care Plan  Allergies  Allergen Reactions   Fosamax [Alendronate Sodium]     Dysphagia and heartburn    Raloxifene Hcl Other (See Comments)    Leg pain and cramps  Leg pain and cramps     Medications Reviewed Today     Reviewed by Celene Kras, CMA (Certified Medical Assistant) on 08/25/21 at [phone]  Med List Status: <None>   Medication Order Taking? Sig Documenting Provider Last Dose Status Informant  atorvastatin (LIPITOR) 80 MG tablet [id number] Yes Take 1 tablet (80  mg total) by mouth daily. Tower, Wynelle Fanny, MD Taking Active   Calcium Carbonate-Vit D-Min (CALCIUM 1200 PO) [id number] Yes Take 1 tablet by mouth daily.  [provider] Taking Active Self  cyclobenzaprine (FLEXERIL) 10 MG tablet [id number] Yes TAKE 1 TABLET BY MOUTH EVERY DAY AT BEDTIME AS NEEDED Tower, Wynelle Fanny, MD Taking Active   ezetimibe (ZETIA) 10 MG tablet [id number] Yes TAKE 1 TABLET EVERY DAY Tower, Wynelle Fanny, MD Taking Active   levothyroxine (SYNTHROID) 75 MCG tablet [id number] Yes Take 1 tablet (75 mcg total) by mouth daily before breakfast. Tower, Wynelle Fanny, MD Taking Active   lidocaine (XYLOCAINE) 2 % solution [id number] Yes Use as directed 15 mLs in the mouth or throat as needed for mouth pain. Lavonia Drafts, MD Taking Active   lisinopril (ZESTRIL) 10 MG tablet [id number] Yes Take 1 tablet (10 mg total) by mouth daily. Tower, Wynelle Fanny, MD Taking Active   LORazepam (ATIVAN) 1 MG tablet [id number] Yes TAKE 1 TABLET AT BEDTIME AS NEEDED FOR SLEEP Tower, Wynelle Fanny, MD Taking Active   nortriptyline (PAMELOR) 75 MG capsule [id number] Yes Take 1 capsule (75 mg total) by mouth at bedtime. Abner Greenspan, MD Taking Active   Discontinued 08/25/21 [phone] (Completed Course)             Patient Active Problem List   Diagnosis Date Noted   Abscess of external ear, left 08/17/2021   Urinary frequency 04/14/2021   Vitamin B12 deficiency 08/07/2020   Prediabetes 03/07/2020   Tingling in extremities 03/07/2020   Neck pain 11/05/2019   Ganglion cyst of tendon sheath of right hand 05/17/2019   Medicare annual wellness visit, subsequent 10/16/2018   Skin lesion of face 10/16/2018   Benign neoplasm of ascending colon    Benign neoplasm of transverse colon    Melanosis, colon    Other specified diseases of esophagus    Stomach  irritation    Constipation 01/24/2018   Compression fracture of L1 vertebra (HCC) 01/24/2018   Anxiety 01/09/2018   Coronary artery calcification seen on CAT scan 11/25/2017    Special screening for malignant neoplasms, colon 11/01/2017   Estrogen deficiency 09/26/2017   Aortic calcification ([place]) 11/30/2016   History of patellar fracture 11/30/2016   Routine general medical examination at a health care facility 06/27/2016   Loss of weight 06/04/2016   History of vertebral compression fracture 01/19/2016   Esophageal dysphagia 06/28/2014   Chronic foot pain 10/27/2011   Low back pain 04/14/2011   FEVER BLISTER 11/17/2010   Essential hypertension 08/18/2009   BUNION 03/04/2009   Allergic rhinitis 01/10/2008   H/O goiter 02/09/2007   Hypothyroidism 02/09/2007   HYPERCHOLESTEROLEMIA 02/09/2007   History of depression 02/09/2007   OSTEOARTHRITIS 02/09/2007   Osteoporosis 02/09/2007   Disturbance in sleep behavior 02/09/2007    Immunization History  Administered Date(s) Administered   Fluad Quad(high Dose 65+) 08/07/2020, 08/11/2021   Influenza Split 05/24/2012   Influenza Whole 06/16/2007   Influenza,inj,Quad PF,6+ Mos 07/04/2013, 06/28/2014, 07/25/2015, 06/04/2016, 08/31/2017, 10/16/2018   Pneumococcal Conjugate-13 08/08/2015   Pneumococcal Polysaccharide-23 11/10/2007, 06/28/2014   Td 11/10/2007    Conditions to be addressed/monitored:  Hypertension, Hyperlipidemia, Hypothyroidism, Depression, and Osteoporosis  Care Plan : [place]  Updates made by Charlton Haws, [place] since 11/09/2021 12:00 AM     Problem: Hypertension, Hyperlipidemia, Hypothyroidism, Depression, and Osteoporosis   Priority: High  Onset Date: 04/07/2021     Long-Range Goal: Disease mgmt   Start Date: 08/28/2021  Expected End Date: 08/28/2022  This Visit's Progress: Not on track  Recent Progress: On track  Priority: High  Note:   Current Barriers:  Unable to independently monitor therapeutic efficacy  Pharmacist Clinical Goal(s):  Patient will contact provider office for questions/concerns as evidenced notation of same in electronic health record  through collaboration with PharmD and provider.   Interventions: 1:1 collaboration with Tower, Wynelle Fanny, MD regarding development and update of comprehensive plan of care as evidenced by provider attestation and co-signature Inter-disciplinary care team collaboration (see longitudinal plan of care) Comprehensive medication review performed; medication list updated in electronic medical record  Hypertension (BP goal <140/90) -Not ideally controlled - BP elevated at home per patient report -Current home BP readings: 141/64, 146/42, 191/82, 151/61, 157/58 -Current treatment: Lisinopril 10 mg daily - Appropriate, Query Effective -Medications previously tried: none reported -Current dietary habits:   -Breakfast - 1 egg, bacon, toast  -Snack - sandwich  -Lunch - baked chicken, pork chops, green beans  -Dinner - meat and country vegetables  -Snacks - pack of crackers  -Drinks - 1 cup of coffee, 3 bottles of water, occasional soda -Current exercise habits: mows her yard, cleans home, flower garden -Educated on BP goals and benefits of medications for prevention of heart attack, stroke and kidney damage; -Recommend to increase lisinopril to 20 mg daily  Hyperlipidemia: (LDL goal < 70) -Not ideally controlled - LDL 83, continues to improve each year -Hx aortic calcification -Current treatment: Atorvastatin 80 mg daily HS - Appropriate, Query Effective, Safe, Accessible Ezetimibe 10 mg daily AM -Appropriate, Query Effective, Safe, Accessible -Medications previously tried: none  -Current dietary patterns: discussed limiting red meats and fatty cuts  -Educated on Cholesterol goals;  -Recommended to continue current medication  Depression/Anxiety/Sleep distubrance (Goal: Improve mood, improve sleep onset) -Not ideally controlled - per patient report, she is not sleeping well; she reports trying trazodone for a  few days but did not see improvement so she stopped -Mood is stable, but still  difficulty with sleep onset; she reports improvement in urinary issues since reducing nortriptyline dose -Current treatment: Lorazepam 1 mg PRN sleep (takes every night) Nortriptyline 75 mg daily HS Cyclobenzaprine 10 mg PRN (takes on occasion for sleep and neck pain) Trazodone 50 mg HS prn -Medications previously tried/failed: melatonin, antihistamines, Restoril (ineffective) -Recommend increasing trazodone to 100 mg (2 tablets) at bedtime; monitor for sedation  Osteoporosis (Goal Improve bone density) -Controlled -Patient denies any falls since ED visit 11/16/20 with right patella fracture. She does have some lightheadedness at times. Reports balance is fair. She has a walker but does not always use it. -Last DEXA Scan: 10/2017   T-Score total hip: -3.6  T-Score lumbar spine: -3.4 -Patient is a candidate for pharmacologic treatment due to T-Score < -2.5 in total hip  and T-Score < -2.5 in lumbar spine -Current treatment  Calcium-vitamin D - daily  Prolia 60 mg q6 mos (started 11/2017, last 07/28/21) -Medications previously tried: alendronate, raloxifene -Recommend weight-bearing and muscle strengthening exercises for building and maintaining bone density. -Recommended to continue current medication; consider repeat DEXA (not done since Prolia started)  Hypothyroidism (Goal: TSH, T4 WNL) -Controlled- TSH 0.42 Aug 2022, on low side of normal -Current treatment  Levothyroxine 75 mcg daily (takes 30 minutes before breakfast with lisinopril, ezetimibe) -Medications previously tried: none  -Recommend to continue current medication  Patient Goals/Self-Care Activities Patient will:  - take medications as prescribed as evidenced by patient report and record review focus on medication adherence by pill box check blood pressure weekly, document, and provide at future appointments       Medication Assistance: None required.  Patient affirms current coverage meets  needs.  Compliance/Adherence/Medication fill history: Care Gaps: None  Star-Rating Drugs: Medication:                Last Fill:         Day Supply Atorvastatin 66m       06/29/21          90 PDC 100% Lisinopril 169m           06/29/21  90 PDC 100%  [facility] [place]: [phone]: [phone] pill box? Yes Pt endorses 100% compliance. She takes her meds at the same time daily.  We discussed: Current pharmacy is preferred with insurance plan and patient is satisfied with pharmacy services Patient decided to: Continue current medication management strategy  Care Plan and Follow Up Patient Decision:  Patient agrees to Care Plan and Follow-up.  Follow Up Plan: Telephone follow up appointment with care management team member scheduled for: 3 months   LiCharlene BrookePharmD, BCACP Clinical Pharmacist [phone]

## 2021-11-10 NOTE — Telephone Encounter (Signed)
F/u scheduled 02/09/22, last filled on 04/20/21 #90 tabs with 0 refills ?

## 2021-11-27 ENCOUNTER — Telehealth: Payer: Self-pay

## 2021-11-27 NOTE — Telephone Encounter (Signed)
Aware of urgent care/ER/follow up precautions  ?Will see her then ?

## 2021-11-27 NOTE — Telephone Encounter (Signed)
Patient removed tick on 3/19. Bite is on right side under bra line.   Not sure if she got all of it out. She has red area around where the bite was about the size of her pinky finger tip. Tender to the touch. Denies any fever but did start having a headache that comes and goes that started on 3/20. Headache goes away with over the counter medications but comes back rates pain as 8/10 location is across forehead. She denies any rash at area of bite.  ? ?I have offered her appointment in office today to get evaluated but she is at work until 6 pm and will not leave. I have made appointment with Dr. Glori Bickers  in our office 11/30/21 at 9:30.  Advised patient that I will send message to Dr. Glori Bickers for her recommendation. The first time she could /would go to urgent care is tomorrow after 2pm  ?

## 2021-11-30 ENCOUNTER — Ambulatory Visit: Payer: Medicare HMO | Admitting: Family Medicine

## 2021-12-01 ENCOUNTER — Ambulatory Visit (INDEPENDENT_AMBULATORY_CARE_PROVIDER_SITE_OTHER): Payer: Medicare HMO

## 2021-12-01 ENCOUNTER — Other Ambulatory Visit: Payer: Self-pay

## 2021-12-01 DIAGNOSIS — E538 Deficiency of other specified B group vitamins: Secondary | ICD-10-CM

## 2021-12-01 MED ORDER — CYANOCOBALAMIN 1000 MCG/ML IJ SOLN
1000.0000 ug | Freq: Once | INTRAMUSCULAR | Status: AC
  Administered 2021-12-01: 1000 ug via INTRAMUSCULAR

## 2021-12-01 NOTE — Progress Notes (Addendum)
Pt came in today to receive vit b12 injection. Pt was given IM Vit B12 1,000 mcg/ml injection in LEFT Deltoid successfully w/o any concerns. Pt has been scheduled for follow up injections.

## 2021-12-04 DIAGNOSIS — E039 Hypothyroidism, unspecified: Secondary | ICD-10-CM

## 2021-12-04 DIAGNOSIS — M810 Age-related osteoporosis without current pathological fracture: Secondary | ICD-10-CM

## 2021-12-04 DIAGNOSIS — E7800 Pure hypercholesterolemia, unspecified: Secondary | ICD-10-CM

## 2021-12-04 DIAGNOSIS — I10 Essential (primary) hypertension: Secondary | ICD-10-CM

## 2021-12-10 ENCOUNTER — Telehealth: Payer: Self-pay

## 2021-12-10 NOTE — Chronic Care Management (AMB) (Signed)
? ? ?Chronic Care Management ?Pharmacy Assistant  ? ?Name: [name] [name]  MRN: [id number] DOB: 1946/08/12 ? ?Reason for Encounter:Hypertension  Disease State ? ?Recent office visits:  ?12/01/21-Family Medicine- B12 injection  ? ?Recent consult visits:  ?10/05/21-Optometry-no data found ? ?Hospital visits:  ?None in previous 6 months ? ?Medications: ?Outpatient Encounter Medications as of 12/10/2021  ?Medication Sig  ? atorvastatin (LIPITOR) 80 MG tablet Take 1 tablet (80 mg total) by mouth daily.  ? Calcium Carbonate-Vit D-Min (CALCIUM 1200 PO) Take 1 tablet by mouth daily.   ? cyclobenzaprine (FLEXERIL) 10 MG tablet TAKE 1 TABLET BY MOUTH EVERY DAY AT BEDTIME AS NEEDED  ? ezetimibe (ZETIA) 10 MG tablet TAKE 1 TABLET EVERY DAY  ? levothyroxine (SYNTHROID) 75 MCG tablet Take 1 tablet (75 mcg total) by mouth daily before breakfast.  ? lidocaine (XYLOCAINE) 2 % solution Use as directed 15 mLs in the mouth or throat as needed for mouth pain.  ? lisinopril (ZESTRIL) 20 MG tablet Take 1 tablet (20 mg total) by mouth daily.  ? LORazepam (ATIVAN) 1 MG tablet TAKE 1 TABLET AT BEDTIME AS NEEDED FOR SLEEP  ? nortriptyline (PAMELOR) 75 MG capsule TAKE 1 CAPSULE AT BEDTIME  ? traZODone (DESYREL) 50 MG tablet Take 0.5-1 tablets (25-50 mg total) by mouth at bedtime as needed for sleep.  ? ?No facility-administered encounter medications on file as of 12/10/2021.  ? ? ?Recent Office Vitals: ?BP Readings from Last 3 Encounters:  ?08/25/21 (!) 179/81  ?08/18/21 (!) 188/82  ?08/17/21 (!) 142/86  ? ?Pulse Readings from Last 3 Encounters:  ?08/25/21 69  ?08/18/21 78  ?08/17/21 90  ?  ?Wt Readings from Last 3 Encounters:  ?08/25/21 110 lb (49.9 kg)  ?08/18/21 108 lb 6.4 oz (49.2 kg)  ?08/17/21 108 lb (49 kg)  ?  ? ?Kidney Function ?Lab Results  ?Component Value Date/Time  ? CREATININE 0.71 07/14/2021 10:26 AM  ? CREATININE 0.80 01/05/2021 10:20 AM  ? CREATININE 0.72 12/13/2013 07:04 PM  ? CREATININE 0.78 12/11/2013 07:24 PM  ? GFR 82.93  07/14/2021 10:26 AM  ? GFRNONAA >60 05/11/2019 09:14 AM  ? GFRNONAA >60 12/13/2013 07:04 PM  ? GFRAA >60 05/11/2019 09:14 AM  ? GFRAA >60 12/13/2013 07:04 PM  ? ? ? ?  Latest Ref Rng & Units 07/14/2021  ? 10:26 AM 01/05/2021  ? 10:20 AM 07/02/2020  ?  9:51 AM  ?BMP  ?Glucose 70 - 99 mg/dL 100   96   91    ?BUN 6 - 23 mg/dL '18   13   10    '$ ?Creatinine 0.40 - 1.20 mg/dL 0.71   0.80   0.64    ?Sodium 135 - 145 mEq/L 141   140   141    ?Potassium 3.5 - 5.1 mEq/L 4.0   4.0   3.8    ?Chloride 96 - 112 mEq/L 105   105   103    ?CO2 19 - 32 mEq/L '28   29   30    '$ ?Calcium 8.4 - 10.5 mg/dL 8.8   9.1   8.9    ? ? ? ?Contacted patient on 12/11/21 to discuss hypertension disease state ? ?Current antihypertensive regimen:  ?  Lisinopril '20mg'$ - take 1 tablet daily ? ? Patient verbally confirms she is taking the above medications as directed. Yes ? ?How often are you checking your Blood Pressure? daily ? ?she checks her blood pressure in the morning after taking her medication. ? ?Current  home BP readings:  ?  Readings beginning December 05, 2021            ? 111/53 ,134/74,141/54,146/52,191/82,157/61,157/68  ? ? ?Wrist or arm cuff:  Arm Cuff  [employer] brand ?Caffeine intake: 1 cup in the am  ?Salt intake: limits adding to foods ?Over the counter medications including pseudoephedrine or NSAIDs? ? ?Any readings above 180/120? Yes  191/82 ?If yes any symptoms of hypertensive emergency? patient denies any symptoms of high blood pressure ? ? ?What recent interventions/DTPs have been made by any provider to improve Blood Pressure control since last CPP Visit:  Recommend increase Lisinopril to '20mg'$  take 1 tablet daily. Patient confirms this beginning 11/10/21 ? ?Any recent hospitalizations or ED visits since last visit with CPP? No ? ?What diet changes have been made to improve Blood Pressure Control?  ? The patient reports a healthy diet of grilled meats and fresh vegetables and she drinks water ? ?What exercise is being done to improve your Blood  Pressure Control?  ? No formal exercise but the patient denies sedentary lifestyle, she stays active in garden when weather permits. ? ?Adherence Review: ?Is the patient currently on ACE/ARB medication? Yes ?Does the patient have >5 day gap between last estimated fill dates? No ? ? ?Star Rating Drugs:  ?Medication:  Last Fill: Day Supply ?Lisinopril '20mg'$  11/10/21  90 ?Atorvastatin '80mg'$  10/14/21  90 ? ? ? ?Care Gaps: ?Annual wellness visit in last year? Yes ?Most Recent BP reading: 179/81  69-P  08/25/21 ? ? ?Upcoming appointments: ?CCM appointment on 01/06/22 ? ? ?[name], CPP notified ? ?[name], CCMA ?Health concierge  ?[phone]  ?

## 2022-01-01 ENCOUNTER — Telehealth: Payer: Self-pay

## 2022-01-01 NOTE — Chronic Care Management (AMB) (Signed)
? ? ?  Chronic Care Management ?Pharmacy Assistant  ? ?Name: [name] [name]  MRN: [id number] DOB: [birth year] ? ? ?Reason for Encounter: Reminder Call ?  ?Conditions to be addressed/monitored: ?HTN and HLD ? ? ?Medications: ?Outpatient Encounter Medications as of 01/01/2022  ?Medication Sig  ? atorvastatin (LIPITOR) 80 MG tablet Take 1 tablet (80 mg total) by mouth daily.  ? Calcium Carbonate-Vit D-Min (CALCIUM 1200 PO) Take 1 tablet by mouth daily.   ? cyclobenzaprine (FLEXERIL) 10 MG tablet TAKE 1 TABLET BY MOUTH EVERY DAY AT BEDTIME AS NEEDED  ? ezetimibe (ZETIA) 10 MG tablet TAKE 1 TABLET EVERY DAY  ? levothyroxine (SYNTHROID) 75 MCG tablet Take 1 tablet (75 mcg total) by mouth daily before breakfast.  ? lidocaine (XYLOCAINE) 2 % solution Use as directed 15 mLs in the mouth or throat as needed for mouth pain.  ? lisinopril (ZESTRIL) 20 MG tablet Take 1 tablet (20 mg total) by mouth daily.  ? LORazepam (ATIVAN) 1 MG tablet TAKE 1 TABLET AT BEDTIME AS NEEDED FOR SLEEP  ? nortriptyline (PAMELOR) 75 MG capsule TAKE 1 CAPSULE AT BEDTIME  ? traZODone (DESYREL) 50 MG tablet Take 0.5-1 tablets (25-50 mg total) by mouth at bedtime as needed for sleep.  ? ?No facility-administered encounter medications on file as of 01/01/2022.  ? ?[name] was contacted to remind of upcoming telephone visit with [name] on 01/06/22 at 3:45pm. Patient was reminded to have any blood glucose and blood pressure readings available for review at appointment.  ? ?Patient confirmed appointment. ? ? ?Are you having any problems with your medications? No  ? ?Do you have any concerns you like to discuss with the pharmacist? No ? ? ? ?Star Rating Drugs: ?Medication:  Last Fill: Day Supply ?Lisinopril '20mg'$             11/10/21              90 ?Atorvastatin '80mg'$        10/14/21              90 ? ?[name], CPP notified ? ?Silvio Sausedo, CCMA ?Health concierge  ?[phone] ?

## 2022-01-05 ENCOUNTER — Ambulatory Visit: Payer: Medicare HMO

## 2022-01-05 ENCOUNTER — Telehealth: Payer: Self-pay

## 2022-01-05 DIAGNOSIS — M8000XS Age-related osteoporosis with current pathological fracture, unspecified site, sequela: Secondary | ICD-10-CM

## 2022-01-05 NOTE — Telephone Encounter (Signed)
Benefits submitted ?Next injection due 01/26/22 ?

## 2022-01-06 ENCOUNTER — Ambulatory Visit (INDEPENDENT_AMBULATORY_CARE_PROVIDER_SITE_OTHER): Payer: Medicare HMO | Admitting: Pharmacist

## 2022-01-06 ENCOUNTER — Other Ambulatory Visit: Payer: Self-pay | Admitting: Family Medicine

## 2022-01-06 DIAGNOSIS — I700 Atherosclerosis of aorta: Secondary | ICD-10-CM

## 2022-01-06 DIAGNOSIS — E7800 Pure hypercholesterolemia, unspecified: Secondary | ICD-10-CM

## 2022-01-06 DIAGNOSIS — M8000XS Age-related osteoporosis with current pathological fracture, unspecified site, sequela: Secondary | ICD-10-CM

## 2022-01-06 DIAGNOSIS — G479 Sleep disorder, unspecified: Secondary | ICD-10-CM

## 2022-01-06 DIAGNOSIS — I10 Essential (primary) hypertension: Secondary | ICD-10-CM

## 2022-01-06 NOTE — Telephone Encounter (Signed)
Name of Medication: Ativan ?Name of Pharmacy: [facility] Mail order ?Last Fill or Written Date and Quantity: 06/29/21 #90 tabs 0 refills ?Last Office Visit and Type:08/17/21 ear issue ?Next Office Visit and Type: 02/09/22 f/u ? ?Zetia also due for refill ?

## 2022-01-06 NOTE — Progress Notes (Signed)
? ?Chronic Care Management ?Pharmacy Note ? ?01/06/2022 ?Name:  [name] [name] MRN:  [id number] DOB:  [birth year] ? ?Summary: CCM F/U phone visit ?-BP somewhat improved on higher dose of lisinopril: 133/74, 133/74, 141/51,151/51, 157/58, 111/53 ?-Pt reports sleep is somewhat improved lately, she has been taking trazodone 50 mg more regularly ?-Pt has been on Prolia since 11/2017, no repeat DEXA yet ? ?Recommendations/Changes made from today's visit: ?-Advised she can take 2 trazodone (100 mg) PRN for sleep ?-Recommend repeat DEXA to monitor Prolia efficacy. Next injection due 01/26/22. ? ?Plan: ?-[facility] will call patient 2 month for BP log ?-Pharmacist follow up televisit scheduled for 3 months ?-PCP 6-mo f/u 02/09/22 ? ? ? ?Subjective: ?[name] [name] is an 76 y.o. year old female who is a primary patient of Tower, Wynelle Fanny, MD.  The CCM team was consulted for assistance with disease management and care coordination needs.   ? ?Engaged with patient by telephone for follow up visit in response to provider referral for pharmacy case management and/or care coordination services.  ? ?Consent to Services:  ?The patient was given information about Chronic Care Management services, agreed to services, and gave verbal consent prior to initiation of services.  Please see initial visit note for detailed documentation.  ? ?Patient Care Team: ?Tower, Wynelle Fanny, MD as PCP - General ?Bryson Ha, OD as Consulting Physician (Optometry) ?Robbyn Hodkinson, Cleaster [facility], [facility] as Pharmacist (Pharmacist) ? ?Recent office visits:  ?08/17/21 FNP Tabitha Dugal - L ear abscess. Rx'd Bactrim, referred to surgery. ? ?08/11/21 Dr Silvio Pate OV: 3-mo f/u; no change in sleep on lower dose nortriptyline, daytime urinary frequency improved, nocturia no change. Pt prefers to continue with 75 mg. ? ?04/14/21 - PCP visit - Pt presented for hypothyroidism and urinary symptoms. TSH improved, continue levothyroxine dose. Cholesterol well controlled. B12 is low  normal, start B12 shots monthly and take 500 mcg daily OTC. A1c increased to 6.5%, monitor. Try reducing nortriptyline from 100 mg to 75 mg at bedtime to see if urinary retention improves. Follow up 3 months. ? ?09/11/20 - Lab notes - Reduce levothyroxine from 88 mcg to 75 mcg daily. Recheck in about 6 weeks. ?08/07/20 - Dr. Glori Bickers, PCP - Patient presented for chronic health conditions. B12 injection today. We will watch A1c, pre-diabetes. Hypothyroidism, update labs. Levothyroxine dose reduced from 100 mcg to 88 mcg. ?  ?Recent consult visits:  ?08/18/21 Dr Christian Mate (Gen surgery): I&D abscess of L ear. ?[facility]/22[facility] - Patient presented after a fall with a right patella fracture. Start course of hydrocodone 5/325 take 1 tablet every 4 hours as needed. ?  ?Hospital visits:  ?11/16/20- ED - Patient presented after a fall. Start hydrocodone 5/325 take 1 tablet 4 hours as needed. ?10/31/20- ED - Patient presented with cough and headache. Started benzonatate 146m take 1 capsule 3 times daily. ? ? ?Objective: ? ?Lab Results  ?Component Value Date  ? CREATININE 0.71 07/14/2021  ? BUN 18 07/14/2021  ? GFR 82.93 07/14/2021  ? GFRNONAA >60 05/11/2019  ? GFRAA >60 05/11/2019  ? NA 141 07/14/2021  ? K 4.0 07/14/2021  ? CALCIUM 8.8 07/14/2021  ? CO2 28 07/14/2021  ? GLUCOSE 100 (H) 07/14/2021  ? ? ?Lab Results  ?Component Value Date/Time  ? HGBA1C 6.5 04/14/2021 02:29 PM  ? HGBA1C 6.3 07/02/2020 09:51 AM  ? GFR 82.93 07/14/2021 10:26 AM  ? GFR 72.13 01/05/2021 10:20 AM  ? ? ?Lab Results  ?Component Value Date  ?  CHOL 157 04/14/2021  ? HDL 59.90 04/14/2021  ? [place] 83 04/14/2021  ? LDLDIRECT 140.9 07/04/2013  ? TRIG 67.0 04/14/2021  ? CHOLHDL 3 04/14/2021  ? ? ? ?  Latest Ref Rng & Units 01/17/2020  ?  9:55 AM 02/06/2019  ? 10:56 AM 12/26/2018  ?  8:58 PM  ?Hepatic Function  ?Total Protein 6.0 - 8.3 g/dL 6.8   6.3   7.4    ?Albumin 3.5 - 5.2 g/dL 4.2   3.7   3.9    ?AST 0 - 37 U/L _0 ?ALT 0 - 35 U/L _1 ?Alk Phosphatase 39 - 117 U/L 48   48   59    ?Total Bilirubin 0.2 - 1.2 mg/dL 0.5   0.4   0.5    ? ? ?Lab Results  ?Component Value Date/Time  ? TSH 0.42 04/14/2021 02:29 PM  ? TSH 0.29 (L) 09/11/2020 09:05 AM  ? FREET4 0.87 05/19/2010 09:04 AM  ? FREET4 0.5 (L) 05/29/2008 03:52 PM  ? ? ? ?  Latest Ref Rng & Units 05/11/2019  ?  9:14 AM 02/06/2019  ? 10:56 AM 12/26/2018  ?  8:58 PM  ?CBC  ?WBC 4.0 - 10.5 K/uL 5.0   4.8   3.9    ?Hemoglobin 12.0 - 15.0 g/dL 14.0   13.5   14.4    ?Hematocrit 36.0 - 46.0 % 42.9   39.3   43.7    ?Platelets 150 - 400 K/uL 235   246.0   201    ? ? ?Lab Results  ?Component Value Date/Time  ? VD25OH 41.20 01/17/2020 09:55 AM  ? VD25OH 33.36 02/06/2019 10:56 AM  ? ? ?Clinical ASCVD: Yes  ?The 10-year ASCVD risk score (Arnett DK, et al., 2019) is: 40.4% ?  Values used to calculate the score: ?    Age: 76 years ?    Sex: Female ?    Is Non-Hispanic African American: No ?    Diabetic: No ?    Tobacco smoker: No ?    Systolic Blood Pressure: 885 mmHg ?    Is BP treated: Yes ?    HDL Cholesterol: 59.9 mg/dL ?    Total Cholesterol: 157 mg/dL   ? ? ?  04/07/2021  ? 12:20 PM 02/16/2021  ?  9:03 AM 12/12/2019  ? 12:00 PM  ?Depression screen PHQ 2/9  ?Decreased Interest 0 3 0  ?Down, Depressed, Hopeless 0 3 1  ?PHQ - 2 Score 0 6 1  ?Altered sleeping  3 0  ?Tired, decreased energy  3 0  ?Change in appetite  0 0  ?Feeling bad or failure about yourself   0 0  ?Trouble concentrating  0 0  ?Moving slowly or fidgety/restless  0 0  ?Suicidal thoughts  0 0  ?PHQ-9 Score  12 1  ?Difficult doing work/chores  Somewhat difficult Not difficult at all  ?  ?Social History  ? ?Tobacco Use  ?Smoking Status Former  ? Packs/day: 0.50  ? Types: Cigarettes  ? Quit date: 08/07/2019  ? Years since quitting: 2.4  ?Smokeless Tobacco Never  ? ?BP Readings from Last 3 Encounters:  ?08/25/21 (!) 179/81  ?08/18/21 (!) 188/82  ?08/17/21 (!) 142/86  ? ?Pulse Readings from Last 3 Encounters:  ?08/25/21 69  ?08/18/21 78  ?08/17/21 90   ? ?Wt Readings from Last 3 Encounters:  ?08/25/21  110 lb (49.9 kg)  ?08/18/21 108 lb 6.4 oz (49.2 kg)  ?08/17/21 108 lb (49 kg)  ? ?BMI Readings from Last 3 Encounters:  ?08/25/21 19.49 kg/m?  ?08/18/21 19.20 kg/m?  ?08/17/21 19.13 kg/m?  ? ? ?Assessment/Interventions: Review of patient past medical history, allergies, medications, health status, including review of consultants reports, laboratory and other test data, was performed as part of comprehensive evaluation and provision of chronic care management services.  ? ?SDOH:  (Social Determinants of Health) assessments and interventions performed: Yes ? ? ?SDOH Screenings  ? ?Alcohol Screen: Low Risk   ? Last Alcohol Screening Score (AUDIT): 0  ?Depression (PHQ2-9): Low Risk   ? PHQ-2 Score: 0  ?Financial Resource Strain: Low Risk   ? Difficulty of Paying Living Expenses: Not very hard  ?Food Insecurity: No Food Insecurity  ? Worried About Charity fundraiser in the Last Year: Never true  ? Ran Out of Food in the Last Year: Never true  ?Housing: Low Risk   ? Last Housing Risk Score: 0  ?Physical Activity: Inactive  ? Days of Exercise per Week: 0 days  ? Minutes of Exercise per Session: 0 min  ?Social Connections: Not on file  ?Stress: No Stress Concern Present  ? Feeling of Stress : Not at all  ?Tobacco Use: Medium Risk  ? Smoking Tobacco Use: Former  ? Smokeless Tobacco Use: Never  ? Passive Exposure: Not on file  ?Transportation Needs: No Transportation Needs  ? Lack of Transportation (Medical): No  ? Lack of Transportation (Non-Medical): No  ? ? ?CCM Care Plan ? ?Allergies  ?Allergen Reactions  ? Fosamax [Alendronate Sodium]   ?  Dysphagia and heartburn   ? Raloxifene Hcl Other (See Comments)  ?  Leg pain and cramps  ?Leg pain and cramps   ? ? ?Medications Reviewed Today   ? ? Reviewed by Celene Kras, CMA (Certified Medical Assistant) on 08/25/21 at [phone]  Med List Status: <None>  ? ?Medication Order Taking? Sig Documenting Provider Last Dose Status  Informant  ?atorvastatin (LIPITOR) 80 MG tablet [id number] Yes Take 1 tablet (80 mg total) by mouth daily. Tower, Wynelle Fanny, MD Taking Active   ?Calcium Carbonate-Vit D-Min (CALCIUM 1200 PO) [id number] Yes Take 1

## 2022-01-06 NOTE — Patient Instructions (Signed)
Visit Information ? ?Phone number for Pharmacist: [phone] ? ? Goals Addressed   ? ?  ?  ?  ?  ? This Visit's Progress  ?  Track and Manage My Blood Pressure-Hypertension     ?  Timeframe:  Long-Range Goal ?Priority:  High ?Start Date:     08/24/21                        ?Expected End Date:   08/24/22                   ? ?Follow Up Date Aug 2023 ?  ?-Obtain a BP monitor for home use (preferably with arm cuff, not wrist) ?- check blood pressure weekly ?- choose a place to take my blood pressure (home, clinic or office, retail store) ?- write blood pressure results in a log or diary  ?  ?Why is this important?   ?You won't feel high blood pressure, but it can still hurt your blood vessels.  ?High blood pressure can cause heart or kidney problems. It can also cause a stroke.  ?Making lifestyle changes like losing a little weight or eating less salt will help.  ?Checking your blood pressure at home and at different times of the day can help to control blood pressure.  ?If the doctor prescribes medicine remember to take it the way the doctor ordered.  ?Call the office if you cannot afford the medicine or if there are questions about it.   ?  ?Notes:  ?  ? ?  ? ? ?Care Plan : [place]  ?Updates made by Charlton Haws, RPH since 01/06/2022 12:00 AM  ?  ? ?Problem: Hypertension, Hyperlipidemia, Hypothyroidism, Depression, and Osteoporosis   ?Priority: High  ?Onset Date: 04/07/2021  ?  ? ?Long-Range Goal: Disease mgmt   ?Start Date: 08/28/2021  ?Expected End Date: 01/07/2023  ?This Visit's Progress: On track  ?Recent Progress: Not on track  ?Priority: High  ?Note:   ?Current Barriers:  ?Unable to maintain control of BP ? ?Pharmacist Clinical Goal(s):  ?Patient will contact provider office for questions/concerns as evidenced notation of same in electronic health record through collaboration with PharmD and provider.  ? ?Interventions: ?1:1 collaboration with Tower, Wynelle Fanny, MD regarding development and  update of comprehensive plan of care as evidenced by provider attestation and co-signature ?Inter-disciplinary care team collaboration (see longitudinal plan of care) ?Comprehensive medication review performed; medication list updated in electronic medical record ? ?Hypertension (BP goal <140/90) ?-Improving - BP improved on higher dose of lisinopril, still above goal on occasion ?-Current home BP readings: 141/51, 191/82, 151/51, 157/58, 111/53, 133/74, 133/74  ?-Current treatment: ?Lisinopril 20 mg daily - Appropriate, Query Effective ?-Medications previously tried: none reported ?-Current dietary habits:  ? -Breakfast - 1 egg, bacon, toast ? -Snack - sandwich ? -Lunch - baked chicken, pork chops, green beans ? -Dinner - meat and country vegetables ? -Snacks - pack of crackers ? -Drinks - 1 cup of coffee, 3 bottles of water, occasional soda ?-Current exercise habits: mows her yard, cleans home, flower garden ?-Educated on BP goals and benefits of medications for prevention of heart attack, stroke and kidney damage; ?-Counseled to check BP daily, keep log and bring to upcoming PCP appt ?-Recommend to continue current medication for now ? ?Hyperlipidemia: (LDL goal < 70) ?-Not ideally controlled - LDL 83 (04/2021), continues to improve each year ?-Hx aortic calcification ?-Current treatment: ?Atorvastatin 80 mg  daily HS - Appropriate, Query Effective, Safe, Accessible ?Ezetimibe 10 mg daily AM -Appropriate, Query Effective, Safe, Accessible ?-Medications previously tried: none  ?-Current dietary patterns: discussed limiting red meats and fatty cuts  ?-Educated on Cholesterol goals;  ?-Recommended to continue current medication ? ?Depression/Anxiety/Sleep distubrance (Goal: Improve mood, improve sleep onset) ?-Improving- per patient report, she has been sleeping better lately, she has been taking trazodone 50 mg more regularly ?-Mood is stable, but still difficulty with sleep onset; she reports improvement in urinary  issues since reducing nortriptyline dose ?-Current treatment: ?Lorazepam 1 mg PRN sleep (takes every night) - Appropriate, Effective, Safe, Accessible ?Nortriptyline 75 mg daily HS - Appropriate, Query Effective ?Trazodone 50 mg HS prn - Appropriate, Query Effective ?-Medications previously tried/failed: melatonin, antihistamines, Restoril (ineffective) ?-Recommend increasing trazodone to 100 mg (2 tablets) at bedtime as needed ? ?Osteoporosis (Goal Improve bone density) ?-Query controlled - pt has not had repeat DEXA since Prolia was started in 2019 ?-Patient denies any falls since ED visit 11/16/20 with right patella fracture. She does have some lightheadedness at times. Reports balance is fair. She has a walker but does not always use it. ?-Last DEXA Scan: 10/2017  ? T-Score total hip: -3.6 ? T-Score lumbar spine: -3.4 ?-Patient is a candidate for pharmacologic treatment due to T-Score < -2.5 in total hip  and T-Score < -2.5 in lumbar spine ?-Current treatment  ?Calcium-vitamin D - daily  ?Prolia 60 mg q6 mos (started 11/2017, last 07/28/21) ?-Medications previously tried: alendronate, raloxifene ?-Recommend weight-bearing and muscle strengthening exercises for building and maintaining bone density. ?-Recommended to continue current medication; consider repeat DEXA this year ? ?Patient Goals/Self-Care Activities ?Patient will:  ?- take medications as prescribed as evidenced by patient report and record review ?focus on medication adherence by pill box ?check blood pressure daily, document, and provide at future appointments ?  ?  ? ?Patient verbalizes understanding of instructions and care plan provided today and agrees to view in [place]. Active MyChart status confirmed with patient.   ?Telephone follow up appointment with pharmacy team member scheduled for: 3 months ? ?[name], PharmD, BCACP ?Clinical Pharmacist ?[facility] ?[phone] ?  ?

## 2022-01-21 NOTE — Telephone Encounter (Signed)
OOP cost is $301 Patient advised. Lab on 01/25/22 and NV 01/28/22

## 2022-01-21 NOTE — Addendum Note (Signed)
Addended by: Kris Mouton on: 01/21/2022 12:45 PM   Modules accepted: Orders

## 2022-01-25 ENCOUNTER — Other Ambulatory Visit (INDEPENDENT_AMBULATORY_CARE_PROVIDER_SITE_OTHER): Payer: Medicare HMO

## 2022-01-25 DIAGNOSIS — M8000XA Age-related osteoporosis with current pathological fracture, unspecified site, initial encounter for fracture: Secondary | ICD-10-CM | POA: Diagnosis not present

## 2022-01-25 DIAGNOSIS — M8000XS Age-related osteoporosis with current pathological fracture, unspecified site, sequela: Secondary | ICD-10-CM

## 2022-01-25 LAB — BASIC METABOLIC PANEL
BUN: 17 mg/dL (ref 6–23)
CO2: 33 mEq/L — ABNORMAL HIGH (ref 19–32)
Calcium: 9 mg/dL (ref 8.4–10.5)
Chloride: 104 mEq/L (ref 96–112)
Creatinine, Ser: 1.21 mg/dL — ABNORMAL HIGH (ref 0.40–1.20)
GFR: 43.58 mL/min — ABNORMAL LOW (ref >60.00)
Glucose, Bld: 95 mg/dL (ref 70–99)
Potassium: 4 mEq/L (ref 3.5–5.1)
Sodium: 142 mEq/L (ref 135–145)

## 2022-01-26 ENCOUNTER — Other Ambulatory Visit: Payer: Self-pay | Admitting: Family Medicine

## 2022-01-26 DIAGNOSIS — I10 Essential (primary) hypertension: Secondary | ICD-10-CM

## 2022-01-27 NOTE — Telephone Encounter (Signed)
noted 

## 2022-01-27 NOTE — Telephone Encounter (Signed)
CrCl is 31.16 mL/min Calcium 9.0 normal  Dr Glori Bickers, I just wanted to make sure it is ok to proceed with the CrCl been close to 30

## 2022-01-27 NOTE — Telephone Encounter (Signed)
It is fine to proceed ,thanks

## 2022-01-28 ENCOUNTER — Ambulatory Visit (INDEPENDENT_AMBULATORY_CARE_PROVIDER_SITE_OTHER): Payer: Medicare HMO | Admitting: Family Medicine

## 2022-01-28 DIAGNOSIS — M8000XA Age-related osteoporosis with current pathological fracture, unspecified site, initial encounter for fracture: Secondary | ICD-10-CM | POA: Diagnosis not present

## 2022-01-28 DIAGNOSIS — M8000XS Age-related osteoporosis with current pathological fracture, unspecified site, sequela: Secondary | ICD-10-CM

## 2022-01-28 MED ORDER — DENOSUMAB 60 MG/ML ~~LOC~~ SOSY
60.0000 mg | PREFILLED_SYRINGE | Freq: Once | SUBCUTANEOUS | Status: AC
  Administered 2022-01-28: 60 mg via SUBCUTANEOUS

## 2022-01-28 NOTE — Progress Notes (Signed)
Per orders of Dr. Glori Bickers, injection of Denosumab 60 mg/ml given by Haruo Stepanek L Vaneta Hammontree. Patient tolerated injection well.

## 2022-02-02 ENCOUNTER — Encounter: Payer: Self-pay | Admitting: Family Medicine

## 2022-02-02 ENCOUNTER — Ambulatory Visit (INDEPENDENT_AMBULATORY_CARE_PROVIDER_SITE_OTHER): Payer: Medicare HMO | Admitting: Family Medicine

## 2022-02-02 VITALS — BP 138/80 | HR 79 | Temp 97.7°F | Ht 63.0 in | Wt 109.1 lb

## 2022-02-02 DIAGNOSIS — E2839 Other primary ovarian failure: Secondary | ICD-10-CM

## 2022-02-02 DIAGNOSIS — M8000XS Age-related osteoporosis with current pathological fracture, unspecified site, sequela: Secondary | ICD-10-CM

## 2022-02-02 DIAGNOSIS — R3129 Other microscopic hematuria: Secondary | ICD-10-CM

## 2022-02-02 DIAGNOSIS — E890 Postprocedural hypothyroidism: Secondary | ICD-10-CM

## 2022-02-02 DIAGNOSIS — R944 Abnormal results of kidney function studies: Secondary | ICD-10-CM | POA: Insufficient documentation

## 2022-02-02 DIAGNOSIS — K219 Gastro-esophageal reflux disease without esophagitis: Secondary | ICD-10-CM | POA: Diagnosis not present

## 2022-02-02 DIAGNOSIS — E538 Deficiency of other specified B group vitamins: Secondary | ICD-10-CM | POA: Diagnosis not present

## 2022-02-02 DIAGNOSIS — I10 Essential (primary) hypertension: Secondary | ICD-10-CM

## 2022-02-02 DIAGNOSIS — R7303 Prediabetes: Secondary | ICD-10-CM | POA: Diagnosis not present

## 2022-02-02 LAB — POCT GLYCOSYLATED HEMOGLOBIN (HGB A1C): Hemoglobin A1C: 5.9 % — AB (ref 4.0–5.6)

## 2022-02-02 LAB — POCT UA - MICROSCOPIC ONLY

## 2022-02-02 LAB — POC URINALSYSI DIPSTICK (AUTOMATED)
Bilirubin, UA: NEGATIVE
Blood, UA: 200
Glucose, UA: NEGATIVE
Ketones, UA: NEGATIVE
Leukocytes, UA: NEGATIVE
Nitrite, UA: NEGATIVE
Protein, UA: NEGATIVE
Spec Grav, UA: 1.02 (ref 1.010–1.025)
Urobilinogen, UA: 0.2 E.U./dL
pH, UA: 6 (ref 5.0–8.0)

## 2022-02-02 MED ORDER — FAMOTIDINE 20 MG PO TABS: 20.0000 mg | ORAL_TABLET | Freq: Two times a day (BID) | ORAL | 3 refills | Status: DC

## 2022-02-02 MED ORDER — CYANOCOBALAMIN 1000 MCG/ML IJ SOLN
1000.0000 ug | Freq: Once | INTRAMUSCULAR | Status: AC
  Administered 2022-02-02: 1000 ug via INTRAMUSCULAR

## 2022-02-02 NOTE — Assessment & Plan Note (Signed)
Hypothyroidism  Pt has no clinical changes No change in energy level/ hair or skin/ edema and no tremor Lab Results  Component Value Date   TSH 0.42 04/14/2021     Plan to continue levothyroxine 75 mcg daily which she is taking appropriately

## 2022-02-02 NOTE — Assessment & Plan Note (Signed)
Last fall/fx was over a year ago Due for dexa-order in/pt will call to schedule Continue ca and D prolia (watching renal numbers)

## 2022-02-02 NOTE — Patient Instructions (Addendum)
Please drink 64 oz of fluid daily (at least)  Water mostly , but some tea and other things is ok also   Stop and leave a urine sample  Let's re check kidney numbers in 2 weeks   B12 shot today     Call and schedule your bone density test / DEXA  Please call the location of your choice from the menu below to schedule your Mammogram and/or Bone Density appointment.    [facility]                      Phone:  [phone], [place] [address]                                                             Services: Traditional and 3D Mammogram, [place] [facility] Density                 Phone: [phone] [address], [place] [address]    Service: Bone Density ONLY   *this site does NOT perform mammograms  [place]                        Phone:  [phone] [address], [place] [address]                                            Services:  3D Mammogram and [place] at [facility]   Phone:  [phone]   [place], [place] [address]                                            Services: 3D Mammogram and Bone Density  [place] at  [facility])  Phone:  [phone]   [address] [place], [place] [address]                                              Services:  3D Mammogram and Bone Density

## 2022-02-02 NOTE — Assessment & Plan Note (Signed)
Monthly B12 shot given today

## 2022-02-02 NOTE — Assessment & Plan Note (Signed)
Improved Lab Results  Component Value Date   HGBA1C 5.9 (A) 02/02/2022    disc imp of low glycemic diet and wt loss to prevent DM2  Enc protein intake

## 2022-02-02 NOTE — Progress Notes (Signed)
Subjective:    Patient ID: [name] [name], female    DOB: 01/12/1946, 76 y.o.   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  Likes this   Some random diarrhea and abd discomfort and heartburn    HTN bp is stable today  No cp or palpitations or headaches or edema  No side effects to medicines  BP Readings from Last 3 Encounters:  02/02/22 138/80  08/25/21 (!) 179/81  08/18/21 (!) 188/82    Lisinopril 10 mg daily   Feels tired today   Hypothyroid Lab Results  Component Value Date   TSH 0.42 04/14/2021   Taking levothyroxine 75 mcg daily   OP Taking prolia Dexa 10/2017 -due [place]/fx -none since 11/2020 with knee fx    Lab Results  Component Value Date   CREATININE 1.21 (H) 01/25/2022   BUN 17 01/25/2022   NA 142 01/25/2022   K 4.0 01/25/2022   CL 104 01/25/2022   CO2 33 (H) 01/25/2022   New reduced GFR of 43.58  No urinary symptoms   Ua today with some micro blood  Results for orders placed or performed in visit on 02/02/22  POCT glycosylated hemoglobin (Hb A1C)  Result Value Ref Range   Hemoglobin A1C 5.9 (A) 4.0 - 5.6 %   HbA1c POC (<> result, manual entry)     HbA1c, POC (prediabetic range)     HbA1c, POC (controlled diabetic range)    POCT Urinalysis Dipstick (Automated)  Result Value Ref Range   Color, UA Yellow    Clarity, UA Clear    Glucose, UA Negative Negative   Bilirubin, UA Negative    Ketones, UA Negative    Spec Grav, UA 1.020 1.010 - 1.025   Blood, UA 200 Ery/uL    pH, UA 6.0 5.0 - 8.0   Protein, UA Negative Negative   Urobilinogen, UA 0.2 0.2 or 1.0 E.U./dL   Nitrite, UA Negative    Leukocytes, UA Negative Negative  POCT UA - Microscopic Only  Result  Value Ref Range   WBC, Ur, HPF, POC 0-1 0 - 5   RBC, Urine, Miroscopic 1-3 0 - 2   Bacteria, U Microscopic few None - Trace   Mucus, UA few    Epithelial cells, urine per micros 0-1    Crystals, Ur, HPF, POC mod    Casts, Ur, LPF, POC none    Yeast, UA none        Too busy to drink enough  Some green tea Not a lot of water   Prediabetes Lab Results  Component Value Date   HGBA1C 6.5 04/14/2021  Improved at 5.9  Lab Results  Component Value Date   VITAMINB12 230 04/14/2021   Oral 500 mcg daily  B12 shots monthly Has nurse visit 6/6   Trazodone 50 mg daily for sleep is helpful  Patient Active Problem List   Diagnosis Date Noted   Decreased GFR 02/02/2022   GERD (gastroesophageal reflux disease) 02/02/2022   Microscopic hematuria 02/02/2022   Urinary frequency 04/14/2021   Vitamin B12 deficiency 08/07/2020   Prediabetes 03/07/2020   Tingling in extremities 03/07/2020  Neck pain 11/05/2019   Ganglion cyst of tendon sheath of right hand 05/17/2019   Medicare annual wellness visit, subsequent 10/16/2018   Skin lesion of face 10/16/2018   Benign neoplasm of ascending colon    Benign neoplasm of transverse colon    Melanosis, colon    Other specified diseases of esophagus    Stomach irritation    Constipation 01/24/2018   Compression fracture of L1 vertebra ([place]) 01/24/2018   Anxiety 01/09/2018   Coronary artery calcification seen on CAT scan 11/25/2017   Special screening for malignant neoplasms, colon 11/01/2017   Estrogen deficiency 09/26/2017   Aortic calcification ([place]) 11/30/2016   History of patellar fracture 11/30/2016   Routine general medical examination at a health care facility 06/27/2016   Loss of weight 06/04/2016   History of vertebral compression fracture 01/19/2016   Esophageal dysphagia 06/28/2014   Chronic foot pain 10/27/2011   Low back pain 04/14/2011   FEVER BLISTER 11/17/2010   Essential hypertension 08/18/2009   BUNION 03/04/2009    Allergic rhinitis 01/10/2008   H/O goiter 02/09/2007   Hypothyroidism 02/09/2007   HYPERCHOLESTEROLEMIA 02/09/2007   History of depression 02/09/2007   OSTEOARTHRITIS 02/09/2007   Osteoporosis 02/09/2007   Disturbance in sleep behavior 02/09/2007   Past Medical History:  Diagnosis Date   COPD (chronic obstructive pulmonary disease) ([place])    Depression    Hyperlipidemia    Hypertension    Hypothyroidism    Osteoarthritis    Osteoporosis    Shingles    arm 1/12   Sleep disorder    Tobacco abuse    past; quit 09   Past Surgical History:  Procedure Laterality Date   ABDOMINAL HYSTERECTOMY     BIOPSY THYROID     pos neoplasm, surgery 09/2006   bunions  06/1998   COLONOSCOPY WITH PROPOFOL N/A 03/15/2018   Procedure: COLONOSCOPY WITH PROPOFOL;  Surgeon: Virgel Manifold, MD;  Location: [facility];  Service: Endoscopy;  Laterality: N/A;   CORONARY ANGIOPLASTY     CXR-copd, ts partial compression fracture  12/2006   dexa  12/1996   ESOPHAGOGASTRODUODENOSCOPY (EGD) WITH PROPOFOL N/A 03/15/2018   Procedure: ESOPHAGOGASTRODUODENOSCOPY (EGD) WITH PROPOFOL;  Surgeon: Virgel Manifold, MD;  Location: [facility];  Service: Endoscopy;  Laterality: N/A;   GANGLION CYST EXCISION Right 05/16/2019   Procedure: REMOVAL GANGLION OF WRIST;  Surgeon: Corky Mull, MD;  Location: [facility];  Service: Orthopedics;  Laterality: Right;   hashimoto  02/1997   hyerectomy- cervical ca cells     LEFT HEART CATH AND CORONARY ANGIOGRAPHY Left 12/23/2017   Procedure: LEFT HEART CATH AND CORONARY ANGIOGRAPHY;  Surgeon: Minna Merritts, MD;  Location: [facility];  Service: Cardiovascular;  Laterality: Left;   left wrist fracture     surgery   right thyroid lobectomy, goiter, thyroiditis  12/2006   stress fractures foot     THYROIDECTOMY  11/2009   Social History   Tobacco Use   Smoking status: Former    Packs/day: 0.50    Types: Cigarettes    Quit date: 08/07/2019    Years since  quitting: 2.4   Smokeless tobacco: Never  Vaping Use   Vaping Use: Never used  Substance Use Topics   Alcohol use: No    Alcohol/week: 0.0 standard drinks   Drug use: No   Family History  Problem Relation Age of Onset   Stroke Father    Hypertension Father    Diabetes Mother  Coronary artery disease Mother    Allergies  Allergen Reactions   Fosamax [Alendronate Sodium]     Dysphagia and heartburn    Raloxifene Hcl Other (See Comments)    Leg pain and cramps  Leg pain and cramps    Current Outpatient Medications on File Prior to Visit  Medication Sig Dispense Refill   atorvastatin (LIPITOR) 80 MG tablet TAKE 1 TABLET EVERY DAY 90 tablet 1   Calcium Carbonate-Vit D-Min (CALCIUM 1200 PO) Take 1 tablet by mouth daily.      cyclobenzaprine (FLEXERIL) 10 MG tablet TAKE 1 TABLET BY MOUTH EVERY DAY AT BEDTIME AS NEEDED 90 tablet 0   ezetimibe (ZETIA) 10 MG tablet TAKE 1 TABLET EVERY DAY 90 tablet 1   levothyroxine (SYNTHROID) 75 MCG tablet Take 1 tablet (75 mcg total) by mouth daily before breakfast. 90 tablet 1   lidocaine (XYLOCAINE) 2 % solution Use as directed 15 mLs in the mouth or throat as needed for mouth pain. 100 mL 0   lisinopril (ZESTRIL) 20 MG tablet TAKE 1 TABLET EVERY DAY 90 tablet 1   LORazepam (ATIVAN) 1 MG tablet TAKE 1 TABLET AT BEDTIME AS NEEDED FOR SLEEP 90 tablet 0   nortriptyline (PAMELOR) 75 MG capsule TAKE 1 CAPSULE AT BEDTIME 90 capsule 1   traZODone (DESYREL) 50 MG tablet Take 0.5-1 tablets (25-50 mg total) by mouth at bedtime as needed for sleep. 90 tablet 0   No current facility-administered medications on file prior to visit.     Review of Systems  Constitutional:  Positive for fatigue. Negative for activity change, appetite change, fever and unexpected weight change.  HENT:  Negative for congestion, ear pain, rhinorrhea, sinus pressure and sore throat.   Eyes:  Negative for pain, redness and visual disturbance.  Respiratory:  Negative for cough,  shortness of breath and wheezing.   Cardiovascular:  Negative for chest pain and palpitations.  Gastrointestinal:  Negative for abdominal pain, blood in stool, constipation and diarrhea.  Endocrine: Negative for polydipsia and polyuria.  Genitourinary:  Negative for dysuria, frequency and urgency.  Musculoskeletal:  Positive for arthralgias. Negative for back pain and myalgias.  Skin:  Negative for pallor and rash.  Allergic/Immunologic: Negative for environmental allergies.  Neurological:  Negative for dizziness, syncope and headaches.  Hematological:  Negative for adenopathy. Does not bruise/bleed easily.  Psychiatric/Behavioral:  Negative for decreased concentration and dysphoric mood. The patient is not nervous/anxious.       Objective:   Physical Exam Constitutional:      General: She is not in acute distress.    Appearance: Normal appearance. She is well-developed and normal weight. She is not ill-appearing or diaphoretic.  HENT:     Head: Normocephalic and atraumatic.  Eyes:     General: No scleral icterus.    Conjunctiva/sclera: Conjunctivae normal.     Pupils: Pupils are equal, round, and reactive to light.  Neck:     Thyroid: No thyromegaly.     Vascular: No carotid bruit or JVD.  Cardiovascular:     Rate and Rhythm: Normal rate and regular rhythm.     Heart sounds: Normal heart sounds.    No gallop.  Pulmonary:     Effort: Pulmonary effort is normal. No respiratory distress.     Breath sounds: Normal breath sounds. No stridor. No wheezing, rhonchi or rales.     Comments: Diffusely distant bs  Abdominal:     General: There is no distension or abdominal bruit.  Palpations: Abdomen is soft.     Tenderness: There is no right CVA tenderness or left CVA tenderness.  Musculoskeletal:     Cervical back: Normal range of motion and neck supple.     Right lower leg: No edema.     Left lower leg: No edema.     Comments: Baseline kyphosis   Lymphadenopathy:      Cervical: No cervical adenopathy.  Skin:    General: Skin is warm and dry.     Coloration: Skin is not pale.     Findings: No rash.  Neurological:     Mental Status: She is alert.     Coordination: Coordination normal.     Deep Tendon Reflexes: Reflexes are normal and symmetric. Reflexes normal.  Psychiatric:        Mood and Affect: Mood normal.          Assessment & Plan:   Problem List Items Addressed This Visit       Cardiovascular and Mediastinum   Essential hypertension - Primary    Lisinopril 10 mg daily  Watching renal labs  bp in fair control at this time  BP Readings from Last 1 Encounters:  02/02/22 138/80   No changes needed Most recent labs reviewed  Disc lifstyle change with low sodium diet and exercise        Relevant Orders   POCT Urinalysis Dipstick (Automated) (Completed)     Digestive   GERD (gastroesophageal reflux disease)    Worse heartburn/belching recently   Px pepcid 20 mg bid  Diet change encouraged if applicable       Relevant Medications   famotidine (PEPCID) 20 MG tablet     Endocrine   Hypothyroidism    Hypothyroidism  Pt has no clinical changes No change in energy level/ hair or skin/ edema and no tremor Lab Results  Component Value Date   TSH 0.42 04/14/2021     Plan to continue levothyroxine 75 mcg daily which she is taking appropriately        Musculoskeletal and Integument   Osteoporosis    Last fall/fx was over a year ago Due for dexa-order in/pt will call to schedule Continue ca and D prolia (watching renal numbers)           Genitourinary   Microscopic hematuria   Relevant Orders   Urine Culture   POCT UA - Microscopic Only (Completed)     Other   Decreased GFR    Possibly due to dehydration  Also on lisinopril for bp 10 mg daily   ua today with rbc, pending culture  incr fluids to 64 oz daily  Re check renal lab 2 wk        Relevant Orders   POCT Urinalysis Dipstick (Automated)  (Completed)   Estrogen deficiency   Relevant Orders   DG Bone Density   Prediabetes    Improved Lab Results  Component Value Date   HGBA1C 5.9 (A) 02/02/2022    disc imp of low glycemic diet and wt loss to prevent DM2  Enc protein intake      Relevant Orders   POCT glycosylated hemoglobin (Hb A1C) (Completed)   Vitamin B12 deficiency    Monthly B12 shot given today

## 2022-02-02 NOTE — Assessment & Plan Note (Signed)
Worse heartburn/belching recently   Px pepcid 20 mg bid  Diet change encouraged if applicable

## 2022-02-02 NOTE — Assessment & Plan Note (Signed)
Lisinopril 10 mg daily  Watching renal labs  bp in fair control at this time  BP Readings from Last 1 Encounters:  02/02/22 138/80   No changes needed Most recent labs reviewed  Disc lifstyle change with low sodium diet and exercise

## 2022-02-02 NOTE — Assessment & Plan Note (Addendum)
Possibly due to dehydration  Also on lisinopril for bp 10 mg daily   ua today with rbc, pending culture  incr fluids to 64 oz daily  Re check renal lab 2 wk

## 2022-02-02 NOTE — Addendum Note (Signed)
Addended by: Tammi Sou on: 02/02/2022 02:09 PM   Modules accepted: Orders

## 2022-02-03 DIAGNOSIS — M8580 Other specified disorders of bone density and structure, unspecified site: Secondary | ICD-10-CM

## 2022-02-03 DIAGNOSIS — E785 Hyperlipidemia, unspecified: Secondary | ICD-10-CM

## 2022-02-03 DIAGNOSIS — F32A Depression, unspecified: Secondary | ICD-10-CM | POA: Diagnosis not present

## 2022-02-03 DIAGNOSIS — I10 Essential (primary) hypertension: Secondary | ICD-10-CM | POA: Diagnosis not present

## 2022-02-03 DIAGNOSIS — Z87891 Personal history of nicotine dependence: Secondary | ICD-10-CM

## 2022-02-05 ENCOUNTER — Other Ambulatory Visit: Payer: Self-pay | Admitting: Family Medicine

## 2022-02-05 LAB — URINE CULTURE
MICRO NUMBER:: 13458356
SPECIMEN QUALITY:: ADEQUATE

## 2022-02-05 MED ORDER — SULFAMETHOXAZOLE-TRIMETHOPRIM 800-160 MG PO TABS: 1.0000 | ORAL_TABLET | Freq: Two times a day (BID) | ORAL | 0 refills | Status: DC

## 2022-02-05 NOTE — Telephone Encounter (Signed)
Pt notified of urine cx results and Dr. Marliss Coots comments. Rx sent to pharmacy and pt aware

## 2022-02-05 NOTE — Telephone Encounter (Signed)
Urine culture shows small amount of e coli  I pended bactrim to send to pharmacy of choice -please send   Please drink water   We will re check renal labs as planned in 2 wk

## 2022-02-09 ENCOUNTER — Ambulatory Visit: Payer: Medicare HMO

## 2022-02-09 ENCOUNTER — Ambulatory Visit: Payer: Medicare HMO | Admitting: Family Medicine

## 2022-02-15 ENCOUNTER — Telehealth: Payer: Self-pay | Admitting: Family Medicine

## 2022-02-15 DIAGNOSIS — R944 Abnormal results of kidney function studies: Secondary | ICD-10-CM

## 2022-02-15 NOTE — Telephone Encounter (Signed)
-----   Message from Ellamae Sia sent at 02/03/2022  3:12 PM EDT ----- Regarding: Lab orders for Tuesday, 6.13.23 Lab orders for renal?

## 2022-02-16 ENCOUNTER — Other Ambulatory Visit: Payer: Medicare HMO

## 2022-02-16 ENCOUNTER — Other Ambulatory Visit (INDEPENDENT_AMBULATORY_CARE_PROVIDER_SITE_OTHER): Payer: Medicare HMO

## 2022-02-16 DIAGNOSIS — R944 Abnormal results of kidney function studies: Secondary | ICD-10-CM

## 2022-02-16 LAB — BASIC METABOLIC PANEL
BUN: 21 mg/dL (ref 6–23)
CO2: 27 mEq/L (ref 19–32)
Calcium: 9.1 mg/dL (ref 8.4–10.5)
Chloride: 104 mEq/L (ref 96–112)
Creatinine, Ser: 0.84 mg/dL (ref 0.40–1.20)
GFR: 67.5 mL/min (ref >60.00)
Glucose, Bld: 100 mg/dL — ABNORMAL HIGH (ref 70–99)
Potassium: 3.9 mEq/L (ref 3.5–5.1)
Sodium: 139 mEq/L (ref 135–145)

## 2022-02-17 ENCOUNTER — Ambulatory Visit (INDEPENDENT_AMBULATORY_CARE_PROVIDER_SITE_OTHER): Payer: Medicare HMO

## 2022-02-17 VITALS — Wt 109.0 lb

## 2022-02-17 DIAGNOSIS — Z0000 Encounter for general adult medical examination without abnormal findings: Secondary | ICD-10-CM

## 2022-02-17 NOTE — Patient Instructions (Addendum)
[name] [name] , Thank you for taking time to come for your Medicare Wellness Visit. I appreciate your ongoing commitment to your health goals. Please review the following plan we discussed and let me know if I can assist you in the future.   Screening recommendations/referrals: Colonoscopy: aged out Mammogram: aged out Bone Density: scheduled for 04/13/22 Recommended yearly ophthalmology/optometry visit for glaucoma screening and checkup Recommended yearly dental visit for hygiene and checkup  Vaccinations: Influenza vaccine: 08/11/21 Pneumococcal vaccine: 08/08/15 Tdap vaccine: 11/10/07, due if have an injury Shingles vaccine: n/d   Covid-19:n/d  Advanced directives: no  Conditions/risks identified: none  Next appointment: Follow up in one year for your annual wellness visit 02/22/23 @ 8:15 am by phone   Preventive Care 60 Years and Older, Female Preventive care refers to lifestyle choices and visits with your health care provider that can promote health and wellness. What does preventive care include? A yearly physical exam. This is also called an annual well check. Dental exams once or twice a year. Routine eye exams. Ask your health care provider how often you should have your eyes checked. Personal lifestyle choices, including: Daily care of your teeth and gums. Regular physical activity. Eating a healthy diet. Avoiding tobacco and drug use. Limiting alcohol use. Practicing safe sex. Taking low-dose aspirin every day. Taking vitamin and mineral supplements as recommended by your health care provider. What happens during an annual well check? The services and screenings done by your health care provider during your annual well check will depend on your age, overall health, lifestyle risk factors, and family history of disease. Counseling  Your health care provider may ask you questions about your: Alcohol use. Tobacco use. Drug use. Emotional well-being. Home and relationship  well-being. Sexual activity. Eating habits. History of falls. Memory and ability to understand (cognition). Work and work Statistician. Reproductive health. Screening  You may have the following tests or measurements: Height, weight, and BMI. Blood pressure. Lipid and cholesterol levels. These may be checked every 5 years, or more frequently if you are over 66 years old. Skin check. Lung cancer screening. You may have this screening every year starting at age 71 if you have a 30-pack-year history of smoking and currently smoke or have quit within the past 15 years. Fecal occult blood test (FOBT) of the stool. You may have this test every year starting at age 78. Flexible sigmoidoscopy or colonoscopy. You may have a sigmoidoscopy every 5 years or a colonoscopy every 10 years starting at age 51. Hepatitis C blood test. Hepatitis B blood test. Sexually transmitted disease (STD) testing. Diabetes screening. This is done by checking your blood sugar (glucose) after you have not eaten for a while (fasting). You may have this done every 1-3 years. Bone density scan. This is done to screen for osteoporosis. You may have this done starting at age 86. Mammogram. This may be done every 1-2 years. Talk to your health care provider about how often you should have regular mammograms. Talk with your health care provider about your test results, treatment options, and if necessary, the need for more tests. Vaccines  Your health care provider may recommend certain vaccines, such as: Influenza vaccine. This is recommended every year. Tetanus, diphtheria, and acellular pertussis (Tdap, Td) vaccine. You may need a Td booster every 10 years. Zoster vaccine. You may need this after age 6. Pneumococcal 13-valent conjugate (PCV13) vaccine. One dose is recommended after age 70. Pneumococcal polysaccharide (PPSV23) vaccine. One dose is recommended after  age 50. Talk to your health care provider about which  screenings and vaccines you need and how often you need them. This information is not intended to replace advice given to you by your health care provider. Make sure you discuss any questions you have with your health care provider. Document Released: 09/19/2015 Document Revised: 05/12/2016 Document Reviewed: 06/24/2015 Elsevier Interactive Patient Education  2017 [place] Prevention in the Home Falls can cause injuries. They can happen to people of all ages. There are many things you can do to make your home safe and to help prevent falls. What can I do on the outside of my home? Regularly fix the edges of walkways and driveways and fix any cracks. Remove anything that might make you trip as you walk through a door, such as a raised step or threshold. Trim any bushes or trees on the path to your home. Use bright outdoor lighting. Clear any walking paths of anything that might make someone trip, such as rocks or tools. Regularly check to see if handrails are loose or broken. Make sure that both sides of any steps have handrails. Any raised decks and porches should have guardrails on the edges. Have any leaves, snow, or ice cleared regularly. Use sand or salt on walking paths during winter. Clean up any spills in your garage right away. This includes oil or grease spills. What can I do in the bathroom? Use night lights. Install grab bars by the toilet and in the tub and shower. Do not use towel bars as grab bars. Use non-skid mats or decals in the tub or shower. If you need to sit down in the shower, use a plastic, non-slip stool. Keep the floor dry. Clean up any water that spills on the floor as soon as it happens. Remove soap buildup in the tub or shower regularly. Attach bath mats securely with double-sided non-slip rug tape. Do not have throw rugs and other things on the floor that can make you trip. What can I do in the bedroom? Use night lights. Make sure that you have a  light by your bed that is easy to reach. Do not use any sheets or blankets that are too big for your bed. They should not hang down onto the floor. Have a firm chair that has side arms. You can use this for support while you get dressed. Do not have throw rugs and other things on the floor that can make you trip. What can I do in the kitchen? Clean up any spills right away. Avoid walking on wet floors. Keep items that you use a lot in easy-to-reach places. If you need to reach something above you, use a strong step stool that has a grab bar. Keep electrical cords out of the way. Do not use floor polish or wax that makes floors slippery. If you must use wax, use non-skid floor wax. Do not have throw rugs and other things on the floor that can make you trip. What can I do with my stairs? Do not leave any items on the stairs. Make sure that there are handrails on both sides of the stairs and use them. Fix handrails that are broken or loose. Make sure that handrails are as long as the stairways. Check any carpeting to make sure that it is firmly attached to the stairs. Fix any carpet that is loose or worn. Avoid having throw rugs at the top or bottom of the stairs. If you do  have throw rugs, attach them to the floor with carpet tape. Make sure that you have a light switch at the top of the stairs and the bottom of the stairs. If you do not have them, ask someone to add them for you. What else can I do to help prevent falls? Wear shoes that: Do not have high heels. Have rubber bottoms. Are comfortable and fit you well. Are closed at the toe. Do not wear sandals. If you use a stepladder: Make sure that it is fully opened. Do not climb a closed stepladder. Make sure that both sides of the stepladder are locked into place. Ask someone to hold it for you, if possible. Clearly mark and make sure that you can see: Any grab bars or handrails. First and last steps. Where the edge of each step  is. Use tools that help you move around (mobility aids) if they are needed. These include: Canes. Walkers. Scooters. Crutches. Turn on the lights when you go into a dark area. Replace any light bulbs as soon as they burn out. Set up your furniture so you have a clear path. Avoid moving your furniture around. If any of your floors are uneven, fix them. If there are any pets around you, be aware of where they are. Review your medicines with your doctor. Some medicines can make you feel dizzy. This can increase your chance of falling. Ask your doctor what other things that you can do to help prevent falls. This information is not intended to replace advice given to you by your health care provider. Make sure you discuss any questions you have with your health care provider. Document Released: 06/19/2009 Document Revised: 01/29/2016 Document Reviewed: 09/27/2014 Elsevier Interactive Patient Education  2017 [employer].

## 2022-02-17 NOTE — Progress Notes (Signed)
Virtual Visit via Telephone Note  I connected with  [name] [name] on 02/17/22 at  9:30 AM EDT by telephone and verified that I am speaking with the correct person using two identifiers.  Location: Patient: home Provider: [place] Persons participating in the virtual visit: [place]   I discussed the limitations, risks, security and privacy concerns of performing an evaluation and management service by telephone and the availability of in person appointments. The patient expressed understanding and agreed to proceed.  Interactive audio and video telecommunications were attempted between this nurse and patient, however failed, due to patient having technical difficulties OR patient did not have access to video capability.  We continued and completed visit with audio only.  Some vital signs may be absent or patient reported.   Dionisio David, LPN  Subjective:   [name] [name] is a 76 y.o. female who presents for Medicare Annual (Subsequent) preventive examination.  Review of Systems     Cardiac Risk Factors include: advanced age (>30mn, >>86women);hypertension     Objective:    There were no vitals filed for this visit. There is no height or weight on file to calculate BMI.     02/17/2022    9:31 AM 05/25/2021   10:36 AM 02/16/2021    9:02 AM 10/31/2020    9:09 AM 12/12/2019   11:59 AM 05/16/2019    5:52 AM 05/11/2019    8:25 AM  Advanced Directives  Does Patient Have a Medical Advance Directive? No No No No Yes No No  Type of APersonnel officerLiving will    Copy of [place] Chart?     No - copy requested    Would patient like information on creating a medical advance directive? No - Patient declined No - Patient declined No - Patient declined No - Patient declined  No - Patient declined No - Patient declined    Current Medications (verified) Outpatient Encounter Medications as of 02/17/2022   Medication Sig   atorvastatin (LIPITOR) 80 MG tablet TAKE 1 TABLET EVERY DAY   Calcium Carbonate-Vit D-Min (CALCIUM 1200 PO) Take 1 tablet by mouth daily.    cyclobenzaprine (FLEXERIL) 10 MG tablet TAKE 1 TABLET BY MOUTH EVERY DAY AT BEDTIME AS NEEDED   ezetimibe (ZETIA) 10 MG tablet TAKE 1 TABLET EVERY DAY   famotidine (PEPCID) 20 MG tablet Take 1 tablet (20 mg total) by mouth 2 (two) times daily.   levothyroxine (SYNTHROID) 75 MCG tablet Take 1 tablet (75 mcg total) by mouth daily before breakfast.   lidocaine (XYLOCAINE) 2 % solution Use as directed 15 mLs in the mouth or throat as needed for mouth pain.   lisinopril (ZESTRIL) 20 MG tablet TAKE 1 TABLET EVERY DAY   LORazepam (ATIVAN) 1 MG tablet TAKE 1 TABLET AT BEDTIME AS NEEDED FOR SLEEP   nortriptyline (PAMELOR) 75 MG capsule TAKE 1 CAPSULE AT BEDTIME   traZODone (DESYREL) 50 MG tablet Take 0.5-1 tablets (25-50 mg total) by mouth at bedtime as needed for sleep.   sulfamethoxazole-trimethoprim (BACTRIM DS) 800-160 MG tablet Take 1 tablet by mouth 2 (two) times daily. (Patient not taking: Reported on 02/17/2022)   No facility-administered encounter medications on file as of 02/17/2022.    Allergies (verified) Fosamax [alendronate sodium] and Raloxifene hcl   History: Past Medical History:  Diagnosis Date   COPD (chronic obstructive pulmonary disease) ([place]    Depression  Hyperlipidemia    Hypertension    Hypothyroidism    Osteoarthritis    Osteoporosis    Shingles    arm 1/12   Sleep disorder    Tobacco abuse    past; quit 09   Past Surgical History:  Procedure Laterality Date   ABDOMINAL HYSTERECTOMY     BIOPSY THYROID     pos neoplasm, surgery 09/2006   bunions  06/1998   COLONOSCOPY WITH PROPOFOL N/A 03/15/2018   Procedure: COLONOSCOPY WITH PROPOFOL;  Surgeon: Virgel Manifold, MD;  Location: [facility];  Service: Endoscopy;  Laterality: N/A;   CORONARY ANGIOPLASTY     CXR-copd, ts partial compression  fracture  12/2006   dexa  12/1996   ESOPHAGOGASTRODUODENOSCOPY (EGD) WITH PROPOFOL N/A 03/15/2018   Procedure: ESOPHAGOGASTRODUODENOSCOPY (EGD) WITH PROPOFOL;  Surgeon: Virgel Manifold, MD;  Location: [facility];  Service: Endoscopy;  Laterality: N/A;   GANGLION CYST EXCISION Right 05/16/2019   Procedure: REMOVAL GANGLION OF WRIST;  Surgeon: Corky Mull, MD;  Location: [facility] ORS;  Service: Orthopedics;  Laterality: Right;   hashimoto  02/1997   hyerectomy- cervical ca cells     LEFT HEART CATH AND CORONARY ANGIOGRAPHY Left 12/23/2017   Procedure: LEFT HEART CATH AND CORONARY ANGIOGRAPHY;  Surgeon: Minna Merritts, MD;  Location: [place] [facility];  Service: Cardiovascular;  Laterality: Left;   left wrist fracture     surgery   right thyroid lobectomy, goiter, thyroiditis  12/2006   stress fractures foot     THYROIDECTOMY  11/2009   Family History  Problem Relation Age of Onset   Stroke Father    Hypertension Father    Diabetes Mother    Coronary artery disease Mother    Social History   Socioeconomic History   Marital status: Widowed    Spouse name: Not on file   Number of children: Not on file   Years of education: Not on file   Highest education level: Not on file  Occupational History   Not on file  Tobacco Use   Smoking status: Former    Packs/day: 0.50    Types: Cigarettes    Quit date: 08/07/2019    Years since quitting: 2.5   Smokeless tobacco: Never  Vaping Use   Vaping Use: Never used  Substance and Sexual Activity   Alcohol use: No    Alcohol/week: 0.0 standard drinks of alcohol   Drug use: No   Sexual activity: Not on file  Other Topics Concern   Not on file  Social History Narrative   Marital Status: widowed.    3 children   Lost husband to throat cancer in 8/16 .    Social Determinants of Health   Financial Resource Strain: Low Risk  (02/17/2022)   Overall Financial Resource Strain (CARDIA)    Difficulty of Paying Living Expenses: Not hard at  all  Food Insecurity: No Food Insecurity (02/17/2022)   Hunger Vital Sign    Worried About Running Out of Food in the Last Year: Never true    Ran Out of Food in the Last Year: Never true  Transportation Needs: No Transportation Needs (02/17/2022)   PRAPARE - Hydrologist (Medical): No    Lack of Transportation (Non-Medical): No  Physical Activity: Sufficiently Active (02/17/2022)   Exercise Vital Sign    Days of Exercise per Week: 5 days    Minutes of Exercise per Session: 30 min  Stress: No Stress Concern Present (  02/17/2022)   [place]    Feeling of Stress : Only a little  Social Connections: Moderately Isolated (02/17/2022)   Social Connection and Isolation Panel [NHANES]    Frequency of Communication with Friends and Family: More than three times a week    Frequency of Social Gatherings with Friends and Family: Once a week    Attends Religious Services: More than 4 times per year    Active Member of [employer] or Organizations: No    Attends Archivist Meetings: Never    Marital Status: Widowed    Tobacco Counseling Counseling given: Not Answered   Clinical Intake:  Pre-visit preparation completed: Yes  Pain : No/denies pain     Nutritional Risks: None Diabetes: No  How often do you need to have someone help you when you read instructions, pamphlets, or other written materials from your doctor or pharmacy?: 1 - Never  Diabetic?no  Interpreter Needed?: No  Information entered by :: Kirke Shaggy, LPN   Activities of Daily Living    02/17/2022    9:32 AM  In your present state of health, do you have any difficulty performing the following activities:  Hearing? 0  Vision? 0  Difficulty concentrating or making decisions? 0  Walking or climbing stairs? 0  Dressing or bathing? 0  Doing errands, shopping? 0  Preparing Food and eating ? N  Using the Toilet? N  In  the past six months, have you accidently leaked urine? N  Do you have problems with loss of bowel control? N  Managing your Medications? N  Managing your Finances? N  Housekeeping or managing your Housekeeping? N    Patient Care Team: Tower, Wynelle Fanny, MD as PCP - General Kerin Ransom Stephannie Li, OD as Consulting Physician (Optometry) [place], Cleaster Corin, [facility] as Pharmacist (Pharmacist)  Indicate any recent Medical Services you may have received from other than [facility] providers in the past year (date may be approximate).     Assessment:   This is a routine wellness examination for Joeli.  Hearing/Vision screen Hearing Screening - Comments:: No aids Vision Screening - Comments:: Wears glasses- [facility]  Dietary issues and exercise activities discussed: Current Exercise Habits: Home exercise routine, Type of exercise: walking, Time (Minutes): 30, Frequency (Times/Week): 5, Weekly Exercise (Minutes/Week): 150, Intensity: Mild   Goals Addressed             This Visit's Progress    DIET - EAT MORE FRUITS AND VEGETABLES         Depression Screen    02/17/2022    9:24 AM 04/07/2021   12:20 PM 02/16/2021    9:03 AM 12/12/2019   12:00 PM 11/05/2019    2:44 PM 10/16/2018    3:57 PM 09/22/2017   11:19 AM  PHQ 2/9 Scores  PHQ - 2 Score 0 0 '6 1 6 '$ 0 0  PHQ- 9 Score '2  12 1 13  '$ 0    Fall Risk    02/17/2022    9:31 AM 08/25/2021    9:31 AM 02/16/2021    9:02 AM 12/12/2019   12:00 PM 10/16/2018    3:57 PM  Fall Risk   Falls in the past year? 0 0 1 0 1  Number falls in past yr: 0  0 0 1  Injury with Fall? 0  1 0 0  Risk for fall due to : No Fall Risks  Medication side effect Medication side  effect   Follow up Falls evaluation completed  Falls evaluation completed;Falls prevention discussed Falls evaluation completed;Falls prevention discussed     FALL RISK PREVENTION PERTAINING TO THE HOME:  Any stairs in or around the home? No  If so, are there any without handrails? No  Home  free of loose throw rugs in walkways, pet beds, electrical cords, etc? Yes  Adequate lighting in your home to reduce risk of falls? Yes   ASSISTIVE DEVICES UTILIZED TO PREVENT FALLS:  Life alert? No  Use of a cane, walker or w/c? No  Grab bars in the bathroom? No  Shower chair or bench in shower? No  Elevated toilet seat or a handicapped toilet? No    Cognitive Function:      02/16/2021    9:05 AM 12/12/2019   12:02 PM 09/22/2017   11:19 AM 07/02/2016   10:08 AM  MMSE - Mini Mental State Exam  Orientation to time '5 5 5 5  '$ Orientation to Place '5 5 5 5  '$ Registration '3 3 3 3  '$ Attention/ Calculation 5 5 0 0  Recall '3 3 3 3  '$ Language- name 2 objects   0 0  Language- repeat '1 1 1 1  '$ Language- follow 3 step command   2 0  Language- follow 3 step command-comments   unable to follow 1 step of 3 step command pt was unable to follow 3 steps of 3 step command  Language- read & follow direction   0 0  Write a sentence   0 0  Copy design   0 0  Total score   19 17        02/17/2022    9:33 AM  6CIT Screen  What Year? 0 points  What month? 0 points  What time? 0 points  Count back from 20 0 points  Months in reverse 0 points  Repeat phrase 2 points  Total Score 2 points    Immunizations Immunization History  Administered Date(s) Administered   Fluad Quad(high Dose 65+) 08/07/2020, 08/11/2021   Influenza Split 05/24/2012   Influenza Whole 06/16/2007   Influenza,inj,Quad PF,6+ Mos 07/04/2013, 06/28/2014, 07/25/2015, 06/04/2016, 08/31/2017, 10/16/2018   Pneumococcal Conjugate-13 08/08/2015   Pneumococcal Polysaccharide-23 11/10/2007, 06/28/2014   Td 11/10/2007    TDAP status: Due, Education has been provided regarding the importance of this vaccine. Advised may receive this vaccine at local pharmacy or Health Dept. Aware to provide a copy of the vaccination record if obtained from local pharmacy or Health Dept. Verbalized acceptance and understanding.  Flu Vaccine status: Up  to date  Pneumococcal vaccine status: Up to date  Covid-19 vaccine status: Declined, Education has been provided regarding the importance of this vaccine but patient still declined. Advised may receive this vaccine at local pharmacy or Health Dept.or vaccine clinic. Aware to provide a copy of the vaccination record if obtained from local pharmacy or Health Dept. Verbalized acceptance and understanding.  Qualifies for Shingles Vaccine? Yes   Zostavax completed No   Shingrix Completed?: No.    Education has been provided regarding the importance of this vaccine. Patient has been advised to call insurance company to determine out of pocket expense if they have not yet received this vaccine. Advised may also receive vaccine at local pharmacy or Health Dept. Verbalized acceptance and understanding.  Screening Tests Health Maintenance  Topic Date Due   Zoster Vaccines- Shingrix (1 of 2) Never done   COVID-19 Vaccine (1) 04/30/2025 (Originally 03/11/1946)   Samul Dada  10/16/2028 (Originally 11/09/2017)   INFLUENZA VACCINE  04/06/2022   Pneumonia Vaccine 60+ Years old  Completed   DEXA SCAN  Completed   Hepatitis C Screening  Completed   HPV VACCINES  Aged Out   COLONOSCOPY (Pts 45-81yr Insurance coverage will need to be confirmed)  Discontinued    Health Maintenance  Health Maintenance Due  Topic Date Due   Zoster Vaccines- Shingrix (1 of 2) Never done    Colorectal cancer screening: No longer required.   Mammogram status: No longer required due to age.  Bone Density status: Ordered 04/13/22 scheduled. Pt provided with contact info and advised to call to schedule appt.  Lung Cancer Screening: (Low Dose CT Chest recommended if Age 76-80years, 30 pack-year currently smoking OR have quit w/in 15years.) does not qualify.    Additional Screening:  Hepatitis C Screening: does qualify; Completed 07/02/16  Vision Screening: Recommended annual ophthalmology exams for early detection of  glaucoma and other disorders of the eye. Is the patient up to date with their annual eye exam?  Yes  Who is the provider or what is the name of the office in which the patient attends annual eye exams? [facility] with a provider, would they like to be referred to a provider to establish care? No .   Dental Screening: Recommended annual dental exams for proper oral hygiene  Community Resource Referral / Chronic Care Management: CRR required this visit?  No   CCM required this visit?  No      Plan:     I have personally reviewed and noted the following in the patient's chart:   Medical and social history Use of alcohol, tobacco or illicit drugs  Current medications and supplements including opioid prescriptions.  Functional ability and status Nutritional status Physical activity Advanced directives List of other physicians Hospitalizations, surgeries, and ER visits in previous 12 months Vitals Screenings to include cognitive, depression, and falls Referrals and appointments  In addition, I have reviewed and discussed with patient certain preventive protocols, quality metrics, and best practice recommendations. A written personalized care plan for preventive services as well as general preventive health recommendations were provided to patient.     LDionisio David LPN   [id number]  Nurse Notes: none

## 2022-02-19 ENCOUNTER — Telehealth: Payer: Self-pay | Admitting: Family Medicine

## 2022-02-19 NOTE — Telephone Encounter (Signed)
Did I need to do anything with this?

## 2022-02-19 NOTE — Telephone Encounter (Signed)
Please schedule CPE with fasting labs prior for sometime after 04/14/22.  Already seen by nurse for [facility].

## 2022-02-19 NOTE — Telephone Encounter (Signed)
Nothing further is needed at this time.  

## 2022-02-19 NOTE — Telephone Encounter (Signed)
Called pt to schedule CPE with fasting labs prior after August 9th, please advise thanks.  Callback Number: [phone]

## 2022-02-25 DIAGNOSIS — H353221 Exudative age-related macular degeneration, left eye, with active choroidal neovascularization: Secondary | ICD-10-CM | POA: Diagnosis not present

## 2022-02-25 DIAGNOSIS — Z0100 Encounter for examination of eyes and vision without abnormal findings: Secondary | ICD-10-CM | POA: Diagnosis not present

## 2022-03-05 ENCOUNTER — Telehealth: Payer: Self-pay

## 2022-03-05 NOTE — Telephone Encounter (Signed)
Patient is calling in asking when she is due for her next B12 injection.

## 2022-03-05 NOTE — Telephone Encounter (Signed)
Patient is scheduled   

## 2022-03-05 NOTE — Telephone Encounter (Signed)
Patient was advised that she had her last Vitamin B-12 injection at her last office visit on 02/02/22. Patient was advised that they are given 31 days after the last one. Patient stated that she needs an appointment on a Monday or Tuesday because she sits with someone other days. Patient was advised that we do not do nurse visits on Mondays. Patient requested a Tuesday.  Can patient be called and added to the nurse scheduled on 03/16/22.

## 2022-03-15 DIAGNOSIS — M25561 Pain in right knee: Secondary | ICD-10-CM | POA: Diagnosis not present

## 2022-03-15 DIAGNOSIS — M1711 Unilateral primary osteoarthritis, right knee: Secondary | ICD-10-CM | POA: Diagnosis not present

## 2022-03-15 DIAGNOSIS — S82034D Nondisplaced transverse fracture of right patella, subsequent encounter for closed fracture with routine healing: Secondary | ICD-10-CM | POA: Diagnosis not present

## 2022-03-16 ENCOUNTER — Ambulatory Visit (INDEPENDENT_AMBULATORY_CARE_PROVIDER_SITE_OTHER): Payer: Medicare HMO

## 2022-03-16 DIAGNOSIS — E538 Deficiency of other specified B group vitamins: Secondary | ICD-10-CM

## 2022-03-16 MED ORDER — CYANOCOBALAMIN 1000 MCG/ML IJ SOLN
1000.0000 ug | Freq: Once | INTRAMUSCULAR | Status: AC
  Administered 2022-03-16: 1000 ug via INTRAMUSCULAR

## 2022-03-16 NOTE — Progress Notes (Signed)
Patient presented for B 12 injection given by Samnang Shugars, CMA to left deltoid, patient voiced no concerns nor showed any signs of distress during injection.  

## 2022-03-24 ENCOUNTER — Telehealth: Payer: Self-pay

## 2022-03-24 NOTE — Chronic Care Management (AMB) (Signed)
Chronic Care Management Pharmacy Assistant   Name: [name] [name]  MRN: [id number] DOB: [birth year]   Reason for Encounter:Hypertension  Disease State   Recent office visits:  02/16/22- Repeat BMP much improved numbers, patient called  02/02/22-[name] Tower,MD(PCP)-f/u labs ordered(Kidney numbers not good, stop nsaids if any ) UA(abnormal-treat with bactrim for 5 days ,increase fluid intake. 01/28/22-[name] Tower,MD(PCP)- Denosubab '60mg'$  injection   Recent consult visits:  03/15/22-[name] [name]- right knee pain- no data   Hospital visits:  None in previous 6 months  Medications: Outpatient Encounter Medications as of 03/24/2022  Medication Sig   atorvastatin (LIPITOR) 80 MG tablet TAKE 1 TABLET EVERY DAY   Calcium Carbonate-Vit D-Min (CALCIUM 1200 PO) Take 1 tablet by mouth daily.    cyclobenzaprine (FLEXERIL) 10 MG tablet TAKE 1 TABLET BY MOUTH EVERY DAY AT BEDTIME AS NEEDED   ezetimibe (ZETIA) 10 MG tablet TAKE 1 TABLET EVERY DAY   famotidine (PEPCID) 20 MG tablet Take 1 tablet (20 mg total) by mouth 2 (two) times daily.   levothyroxine (SYNTHROID) 75 MCG tablet TAKE 1 TABLET DAILY BEFORE BREAKFAST   lidocaine (XYLOCAINE) 2 % solution Use as directed 15 mLs in the mouth or throat as needed for mouth pain.   lisinopril (ZESTRIL) 20 MG tablet TAKE 1 TABLET EVERY DAY   LORazepam (ATIVAN) 1 MG tablet TAKE 1 TABLET AT BEDTIME AS NEEDED FOR SLEEP   nortriptyline (PAMELOR) 75 MG capsule TAKE 1 CAPSULE AT BEDTIME   sulfamethoxazole-trimethoprim (BACTRIM DS) 800-160 MG tablet Take 1 tablet by mouth 2 (two) times daily. (Patient not taking: Reported on 02/17/2022)   traZODone (DESYREL) 50 MG tablet Take 0.5-1 tablets (25-50 mg total) by mouth at bedtime as needed for sleep.   No facility-administered encounter medications on file as of 03/24/2022.    Recent Office Vitals: BP Readings from Last 3 Encounters:  02/02/22 138/80  08/25/21 (!) 179/81  08/18/21 (!) 188/82   Pulse  Readings from Last 3 Encounters:  02/02/22 79  08/25/21 69  08/18/21 78    Wt Readings from Last 3 Encounters:  02/17/22 109 lb (49.4 kg)  02/02/22 109 lb 2 oz (49.5 kg)  08/25/21 110 lb (49.9 kg)     Kidney Function Lab Results  Component Value Date/Time   CREATININE 0.84 02/16/2022 07:51 AM   CREATININE 1.21 (H) 01/25/2022 12:58 PM   CREATININE 0.72 12/13/2013 07:04 PM   CREATININE 0.78 12/11/2013 07:24 PM   GFR 67.50 02/16/2022 07:51 AM   GFRNONAA >60 05/11/2019 09:14 AM   GFRNONAA >60 12/13/2013 07:04 PM   GFRAA >60 05/11/2019 09:14 AM   GFRAA >60 12/13/2013 07:04 PM       Latest Ref Rng & Units 02/16/2022    7:51 AM 01/25/2022   12:58 PM 07/14/2021   10:26 AM  BMP  Glucose 70 - 99 mg/dL 100  95  100   BUN 6 - 23 mg/dL '21  17  18   '$ Creatinine 0.40 - 1.20 mg/dL 0.84  1.21  0.71   Sodium 135 - 145 mEq/L 139  142  141   Potassium 3.5 - 5.1 mEq/L 3.9  4.0  4.0   Chloride 96 - 112 mEq/L 104  104  105   CO2 19 - 32 mEq/L 27  33  28   Calcium 8.4 - 10.5 mg/dL 9.1  9.0  8.8      Contacted patient on 03/25/22 to discuss hypertension disease state  Current antihypertensive regimen:  Lisinopril 20 mg  daily   Patient verbally confirms she is taking the above medications as directed. Yes  How often are you checking your Blood Pressure? infrequently the patient states her monitor stopped working and she took it with her to the [facility] for inspection.Patient did not have receipt, so took back home. She is unable to use her monitor, unable to afford another one. I encouraged the patient to see if family member has one that she could borrow and use awhile just to get idea of BP current readings. She agreed to ask     Current home BP readings: none available   Wrist or arm cuff: arm cuff- (not working properly) Caffeine intake:  1 cup of regular coffee in am  Salt intake:  she does not add salt at all. Over the counter medications including pseudoephedrine or NSAIDs?  Sudafed.  Any readings above 180/120? No  What recent interventions/DTPs have been made by any provider to improve Blood Pressure control since last CPP Visit:  The patient has a broken BP monitor and states new one is unaffordable, looking into options for the patient to obtain another monitor, She has BP readings in normal limits in office setting   Any recent hospitalizations or ED visits since last visit with CPP? No  What diet changes have been made to improve Blood Pressure Control?    The patient has cut out salt from her diet. She eats fresh salads, green vegetables. Enjoys home cooked meals   What exercise is being done to improve your Blood Pressure Control?   No formal exercise. The patient has a garden and works outside in it . The patient takes walks for exercise when she can   Adherence Review: Is the patient currently on ACE/ARB medication? Yes Does the patient have >5 day gap between last estimated fill dates? No   Star Rating Drugs:  Medication:  Last Fill: Day Supply Atorvastatin '80mg'$  02/25/22 90 Lisinopril '20mg'$  02/25/22 90  Uses [facility] mail order   Care Gaps: Annual wellness visit in last year? Yes Most Recent BP reading:138/80  02/02/22)   Upcoming appointments: PCP appointment on 04/19/22, CCM appointment on 04/07/22, and Cardiology appointment on 05/04/22   [name] [name], CPP notified  [name] [name], [place]  [phone]

## 2022-03-29 DIAGNOSIS — H353221 Exudative age-related macular degeneration, left eye, with active choroidal neovascularization: Secondary | ICD-10-CM | POA: Diagnosis not present

## 2022-04-02 ENCOUNTER — Telehealth: Payer: Self-pay

## 2022-04-02 NOTE — Chronic Care Management (AMB) (Signed)
    Chronic Care Management Pharmacy Assistant   Name: [name] [name]  MRN: [id number] DOB: 09-23-1945   Reason for Encounter: Reminder Call   Conditions to be addressed/monitored: HTN   Medications: Outpatient Encounter Medications as of 04/02/2022  Medication Sig   atorvastatin (LIPITOR) 80 MG tablet TAKE 1 TABLET EVERY DAY   Calcium Carbonate-Vit D-Min (CALCIUM 1200 PO) Take 1 tablet by mouth daily.    cyclobenzaprine (FLEXERIL) 10 MG tablet TAKE 1 TABLET BY MOUTH EVERY DAY AT BEDTIME AS NEEDED   ezetimibe (ZETIA) 10 MG tablet TAKE 1 TABLET EVERY DAY   famotidine (PEPCID) 20 MG tablet Take 1 tablet (20 mg total) by mouth 2 (two) times daily.   levothyroxine (SYNTHROID) 75 MCG tablet TAKE 1 TABLET DAILY BEFORE BREAKFAST   lidocaine (XYLOCAINE) 2 % solution Use as directed 15 mLs in the mouth or throat as needed for mouth pain.   lisinopril (ZESTRIL) 20 MG tablet TAKE 1 TABLET EVERY DAY   LORazepam (ATIVAN) 1 MG tablet TAKE 1 TABLET AT BEDTIME AS NEEDED FOR SLEEP   nortriptyline (PAMELOR) 75 MG capsule TAKE 1 CAPSULE AT BEDTIME   sulfamethoxazole-trimethoprim (BACTRIM DS) 800-160 MG tablet Take 1 tablet by mouth 2 (two) times daily. (Patient not taking: Reported on 02/17/2022)   traZODone (DESYREL) 50 MG tablet Take 0.5-1 tablets (25-50 mg total) by mouth at bedtime as needed for sleep.   No facility-administered encounter medications on file as of 04/02/2022.    [name] [name] was contacted to remind of upcoming telephone visit with [name]  on 04/07/22 at 3:00pm. Patient was reminded to have any blood glucose and blood pressure readings available for review at appointment.   Patient confirmed appointment.   Are you having any problems with your medications? No   Do you have any concerns you like to discuss with the pharmacist? No   CCM referral has been placed prior to visit?  No    Star Rating Drugs: Medication:  Last Fill: Day Supply Atorvastatin  '80mg'$  02/25/22 90  Patient uses [facility] mail order  Lisinopril '20mg'$  02/25/22 [place], CPP notified  Avel Sensor, [place]  [phone]

## 2022-04-07 ENCOUNTER — Encounter: Payer: Self-pay | Admitting: Ophthalmology

## 2022-04-07 ENCOUNTER — Telehealth: Payer: Medicare HMO

## 2022-04-07 NOTE — Telephone Encounter (Signed)
Spoke with patient, she affirms compliance with medications as prescribed. She hs been unable to monitor BP at home as BP monitor is broken, but BP was at goal in most recent OV. She denies any questions or concerns at this time.  Plan: Transition CCM to Self Care: Patient achieved CCM goals and no longer needs to be contacted as frequently. The patient has been provided with contact information for the care management team and has been advised to call with any health related questions or concerns.

## 2022-04-07 NOTE — Progress Notes (Deleted)
Chronic Care Management Pharmacy Note  04/07/2022 Name:  [name] [name] MRN:  [id number] DOB:  [birth year]  Summary: CCM F/U phone visit -BP somewhat improved on higher dose of lisinopril: 133/74, 133/74, 141/51,151/51, 157/58, 111/53 -Pt reports sleep is somewhat improved lately, she has been taking trazodone 50 mg more regularly -Pt has been on Prolia since 11/2017, no repeat DEXA yet  Recommendations/Changes made from today's visit: -Advised she can take 2 trazodone (100 mg) PRN for sleep -Recommend repeat DEXA to monitor Prolia efficacy. Next injection due 01/26/22.  Plan: -[place] will call patient 2 month for BP log -Pharmacist follow up televisit scheduled for 3 months -PCP CPE 04/19/22    Subjective: [name] [name] is an 76 y.o. year old female who is a primary patient of Tower, Wynelle Fanny, MD.  The CCM team was consulted for assistance with disease management and care coordination needs.    Engaged with patient by telephone for follow up visit in response to provider referral for pharmacy case management and/or care coordination services.   Consent to Services:  The patient was given information about Chronic Care Management services, agreed to services, and gave verbal consent prior to initiation of services.  Please see initial visit note for detailed documentation.   Patient Care Team: Tower, Wynelle Fanny, MD as PCP - General Kerin Ransom Stephannie Li, OD as Consulting Physician (Optometry) Charlton Haws, [facility] as Pharmacist (Pharmacist)  Recent office visits:  02/02/22 Dr Glori Bickers OV: f/u - A1c 5.9. Ordered DEXA. Ordered famotidine 20 mg BID. Recheck kidneys 2 weeks.  08/17/21 FNP Tabitha Dugal - L ear abscess. Rx'd Bactrim, referred to surgery.  08/11/21 Dr Silvio Pate OV: 3-mo f/u; no change in sleep on lower dose nortriptyline, daytime urinary frequency improved, nocturia no change. Pt prefers to continue with 75 mg.  04/14/21 - PCP visit - Pt presented for hypothyroidism and  urinary symptoms. TSH improved, continue levothyroxine dose. Cholesterol well controlled. B12 is low normal, start B12 shots monthly and take 500 mcg daily OTC. A1c increased to 6.5%, monitor. Try reducing nortriptyline from 100 mg to 75 mg at bedtime to see if urinary retention improves. Follow up 3 months.  09/11/20 - Lab notes - Reduce levothyroxine from 88 mcg to 75 mcg daily. Recheck in about 6 weeks. 08/07/20 - Dr. Glori Bickers, PCP - Patient presented for chronic health conditions. B12 injection today. We will watch A1c, pre-diabetes. Hypothyroidism, update labs. Levothyroxine dose reduced from 100 mcg to 88 mcg.   Recent consult visits:  08/18/21 Dr Christian Mate (Gen surgery): I&D abscess of L ear. [facility]/22[facility] - Patient presented after a fall with a right patella fracture. Start course of hydrocodone 5/325 take 1 tablet every 4 hours as needed.   Hospital visits:  11/16/20- ED - Patient presented after a fall. Start hydrocodone 5/325 take 1 tablet 4 hours as needed. 10/31/20- ED - Patient presented with cough and headache. Started benzonatate 124m take 1 capsule 3 times daily.   Objective:  Lab Results  Component Value Date   CREATININE 0.84 02/16/2022   BUN 21 02/16/2022   GFR 67.50 02/16/2022   GFRNONAA >60 05/11/2019   GFRAA >60 05/11/2019   NA 139 02/16/2022   K 3.9 02/16/2022   CALCIUM 9.1 02/16/2022   CO2 27 02/16/2022   GLUCOSE 100 (H) 02/16/2022    Lab Results  Component Value Date/Time   HGBA1C 5.9 (A) 02/02/2022 09:36 AM   HGBA1C 6.5 04/14/2021 02:29 PM   HGBA1C 6.3  07/02/2020 09:51 AM   GFR 67.50 02/16/2022 07:51 AM   GFR 43.58 (L) 01/25/2022 12:58 PM    Lab Results  Component Value Date   CHOL 157 04/14/2021   HDL 59.90 04/14/2021   LDLCALC 83 04/14/2021   LDLDIRECT 140.9 07/04/2013   TRIG 67.0 04/14/2021   CHOLHDL 3 04/14/2021       Latest Ref Rng & Units 01/17/2020    9:55 AM 02/06/2019   10:56 AM 12/26/2018    8:58 PM  Hepatic Function  Total  Protein 6.0 - 8.3 g/dL 6.8  6.3  7.4   Albumin 3.5 - 5.2 g/dL 4.2  3.7  3.9   AST 0 - 37 U/L 19  19  25    ALT 0 - 35 U/L 13  12  18    Alk Phosphatase 39 - 117 U/L 48  48  59   Total Bilirubin 0.2 - 1.2 mg/dL 0.5  0.4  0.5     Lab Results  Component Value Date/Time   TSH 0.42 04/14/2021 02:29 PM   TSH 0.29 (L) 09/11/2020 09:05 AM   FREET4 0.87 05/19/2010 09:04 AM   FREET4 0.5 (L) 05/29/2008 03:52 PM       Latest Ref Rng & Units 05/11/2019    9:14 AM 02/06/2019   10:56 AM 12/26/2018    8:58 PM  CBC  WBC 4.0 - 10.5 K/uL 5.0  4.8  3.9   Hemoglobin 12.0 - 15.0 g/dL 14.0  13.5  14.4   Hematocrit 36.0 - 46.0 % 42.9  39.3  43.7   Platelets 150 - 400 K/uL 235  246.0  201     Lab Results  Component Value Date/Time   VD25OH 41.20 01/17/2020 09:55 AM   VD25OH 33.36 02/06/2019 10:56 AM    Clinical ASCVD: Yes  The 10-year ASCVD risk score (Arnett DK, et al., 2019) is: 18.2%   Values used to calculate the score:     Age: 55 years     Sex: Female     Is Non-Hispanic African American: No     Diabetic: No     Tobacco smoker: No     Systolic Blood Pressure: 735 mmHg     Is BP treated: Yes     HDL Cholesterol: 59.9 mg/dL     Total Cholesterol: 157 mg/dL       02/17/2022    9:24 AM 04/07/2021   12:20 PM 02/16/2021    9:03 AM  Depression screen PHQ 2/9  Decreased Interest 0 0 3  Down, Depressed, Hopeless 0 0 3  PHQ - 2 Score 0 0 6  Altered sleeping 1  3  Tired, decreased energy 1  3  Change in appetite 0  0  Feeling bad or failure about yourself  0  0  Trouble concentrating 0  0  Moving slowly or fidgety/restless 0  0  Suicidal thoughts 0  0  PHQ-9 Score 2  12  Difficult doing work/chores Not difficult at all  Somewhat difficult    Social History   Tobacco Use  Smoking Status Former   Packs/day: 0.50   Types: Cigarettes   Quit date: 08/07/2019   Years since quitting: 2.6  Smokeless Tobacco Never   BP Readings from Last 3 Encounters:  02/02/22 138/80  08/25/21 (!) 179/81   08/18/21 (!) 188/82   Pulse Readings from Last 3 Encounters:  02/02/22 79  08/25/21 69  08/18/21 78   Wt Readings from Last 3 Encounters:  02/17/22 109  lb (49.4 kg)  02/02/22 109 lb 2 oz (49.5 kg)  08/25/21 110 lb (49.9 kg)   BMI Readings from Last 3 Encounters:  02/17/22 19.31 kg/m  02/02/22 19.33 kg/m  08/25/21 19.49 kg/m    Assessment/Interventions: Review of patient past medical history, allergies, medications, health status, including review of consultants reports, laboratory and other test data, was performed as part of comprehensive evaluation and provision of chronic care management services.   SDOH:  (Social Determinants of Health) assessments and interventions performed: No - done 02/2022   SDOH Screenings   Alcohol Screen: Low Risk  (02/17/2022)   Alcohol Screen    Last Alcohol Screening Score (AUDIT): 0  Depression (PHQ2-9): Low Risk  (02/17/2022)   Depression (PHQ2-9)    PHQ-2 Score: 2  Financial Resource Strain: Low Risk  (02/17/2022)   Overall Financial Resource Strain (CARDIA)    Difficulty of Paying Living Expenses: Not hard at all  Food Insecurity: No Food Insecurity (02/17/2022)   Hunger Vital Sign    Worried About Running Out of Food in the Last Year: Never true    Ran Out of Food in the Last Year: Never true  Housing: Low Risk  (02/17/2022)   Housing    Last Housing Risk Score: 0  Physical Activity: Sufficiently Active (02/17/2022)   Exercise Vital Sign    Days of Exercise per Week: 5 days    Minutes of Exercise per Session: 30 min  Social Connections: Moderately Isolated (02/17/2022)   Social Connection and Isolation Panel [NHANES]    Frequency of Communication with Friends and Family: More than three times a week    Frequency of Social Gatherings with Friends and Family: Once a week    Attends Religious Services: More than 4 times per year    Active Member of [employer] or Organizations: No    Attends Archivist Meetings: Never    Marital  Status: Widowed  Stress: No Stress Concern Present (02/17/2022)   [place]    Feeling of Stress : Only a little  Tobacco Use: Medium Risk (02/17/2022)   Patient History    Smoking Tobacco Use: Former    Smokeless Tobacco Use: Never    Passive Exposure: Not on file  Transportation Needs: No Transportation Needs (02/17/2022)   PRAPARE - Hydrologist (Medical): No    Lack of Transportation (Non-Medical): No    CCM Care Plan  Allergies  Allergen Reactions   Fosamax [Alendronate Sodium]     Dysphagia and heartburn    Raloxifene Hcl Other (See Comments)    Leg pain and cramps  Leg pain and cramps     Medications Reviewed Today     Reviewed by Dionisio David, LPN (Licensed Practical Nurse) on 02/17/22 at [place] List Status: <None>   Medication Order Taking? Sig Documenting Provider Last Dose Status Informant  atorvastatin (LIPITOR) 80 MG tablet [id number] Yes TAKE 1 TABLET EVERY DAY Tower, Wynelle Fanny, MD Taking Active   Calcium Carbonate-Vit D-Min (CALCIUM 1200 PO) [id number] Yes Take 1 tablet by mouth daily.  [provider] Taking Active Self  cyclobenzaprine (FLEXERIL) 10 MG tablet [id number] Yes TAKE 1 TABLET BY MOUTH EVERY DAY AT BEDTIME AS NEEDED Tower, Wynelle Fanny, MD Taking Active   ezetimibe (ZETIA) 10 MG tablet [id number] Yes TAKE 1 TABLET EVERY DAY Tower, Wynelle Fanny, MD Taking Active   famotidine (PEPCID) 20 MG tablet [id number] Yes Take 1  tablet (20 mg total) by mouth 2 (two) times daily. Tower, Wynelle Fanny, MD Taking Active   levothyroxine (SYNTHROID) 75 MCG tablet [id number] Yes Take 1 tablet (75 mcg total) by mouth daily before breakfast. Tower, Wynelle Fanny, MD Taking Active   lidocaine (XYLOCAINE) 2 % solution [id number] Yes Use as directed 15 mLs in the mouth or throat as needed for mouth pain. Lavonia Drafts, MD Taking Active   lisinopril (ZESTRIL) 20 MG tablet [id number] Yes TAKE 1 TABLET  EVERY DAY Tower, Wynelle Fanny, MD Taking Active   LORazepam (ATIVAN) 1 MG tablet [id number] Yes TAKE 1 TABLET AT BEDTIME AS NEEDED FOR SLEEP Tower, Wynelle Fanny, MD Taking Active   nortriptyline (PAMELOR) 75 MG capsule [id number] Yes TAKE 1 CAPSULE AT BEDTIME Tower, Wynelle Fanny, MD Taking Active   sulfamethoxazole-trimethoprim (BACTRIM DS) 800-160 MG tablet [id number] No Take 1 tablet by mouth 2 (two) times daily.  Patient not taking: Reported on 02/17/2022   Abner Greenspan, MD Not Taking Active   traZODone (DESYREL) 50 MG tablet [id number] Yes Take 0.5-1 tablets (25-50 mg total) by mouth at bedtime as needed for sleep. Tower, Wynelle Fanny, MD Taking Active             Patient Active Problem List   Diagnosis Date Noted   Decreased GFR 02/02/2022   GERD (gastroesophageal reflux disease) 02/02/2022   Microscopic hematuria 02/02/2022   Urinary frequency 04/14/2021   Vitamin B12 deficiency 08/07/2020   Prediabetes 03/07/2020   Tingling in extremities 03/07/2020   Neck pain 11/05/2019   Ganglion cyst of tendon sheath of right hand 05/17/2019   Medicare annual wellness visit, subsequent 10/16/2018   Skin lesion of face 10/16/2018   Benign neoplasm of ascending colon    Benign neoplasm of transverse colon    Melanosis, colon    Other specified diseases of esophagus    Stomach irritation    Constipation 01/24/2018   Compression fracture of L1 vertebra ([place]) 01/24/2018   Anxiety 01/09/2018   Coronary artery calcification seen on CAT scan 11/25/2017   Special screening for malignant neoplasms, colon 11/01/2017   Estrogen deficiency 09/26/2017   Aortic calcification ([place]) 11/30/2016   History of patellar fracture 11/30/2016   Routine general medical examination at a health care facility 06/27/2016   Loss of weight 06/04/2016   History of vertebral compression fracture 01/19/2016   Esophageal dysphagia 06/28/2014   Chronic foot pain 10/27/2011   Low back pain 04/14/2011   FEVER BLISTER 11/17/2010    Essential hypertension 08/18/2009   BUNION 03/04/2009   Allergic rhinitis 01/10/2008   H/O goiter 02/09/2007   Hypothyroidism 02/09/2007   HYPERCHOLESTEROLEMIA 02/09/2007   History of depression 02/09/2007   OSTEOARTHRITIS 02/09/2007   Osteoporosis 02/09/2007   Disturbance in sleep behavior 02/09/2007    Immunization History  Administered Date(s) Administered   Fluad Quad(high Dose 65+) 08/07/2020, 08/11/2021   Influenza Split 05/24/2012   Influenza Whole 06/16/2007   Influenza,inj,Quad PF,6+ Mos 07/04/2013, 06/28/2014, 07/25/2015, 06/04/2016, 08/31/2017, 10/16/2018   Pneumococcal Conjugate-13 08/08/2015   Pneumococcal Polysaccharide-23 11/10/2007, 06/28/2014   Td 11/10/2007    Conditions to be addressed/monitored:  Hypertension, Hyperlipidemia, Hypothyroidism, Depression, and Osteoporosis  There are no care plans that you recently modified to display for this patient.     Medication Assistance: None required.  Patient affirms current coverage meets needs.  Compliance/Adherence/Medication fill history: Care Gaps: None  Star-Rating Drugs: Atorvastatin - PDC 92% Lisinopril - PDC 85%  Medication Access: Within the past 30 days, how often  has patient missed a dose of medication? *** Is a pillbox or other method used to improve adherence? {YES/NO:21197} Factors that may affect medication adherence? {CHL DESC; BARRIERS:21522} Are meds synced by current pharmacy? {YES/NO:21197} Are meds delivered by current pharmacy? {YES/NO:21197} Does patient experience delays in picking up medications due to transportation concerns? {YES/NO:21197}  [facility] Reviewed: Is patient disadvantaged to use [facility]?: {YES/NO:21197} Current Rx insurance plan: *** Name and location of Current pharmacy:  [facility] [place] - [address] [place] [address] Phone: [phone] Fax: [phone]  [facility] -  [place] [address], [place] [address] Phone: [phone] Fax: [phone]  [id number] [place] [place] [address]Phone: [phone]: [phone] [facility] services reviewed with patient today?: {YES/NO:21197} Patient requests to transfer care to [facility]?: {YES/NO:21197} Reason patient declined to change pharmacies: {US patient preference:[id number]}    Care Plan and Follow Up Patient Decision:  Patient agrees to Care Plan and Follow-up.  Follow Up Plan: Telephone follow up appointment with care management team member scheduled for: 3 months  LCharlene Brooke PharmD, BCACP Clinical Pharmacist [phone]  Current Barriers:  Unable to maintain control of BP  Pharmacist Clinical Goal(s):  Patient will contact provider office for questions/concerns as evidenced notation of same in electronic health record through collaboration with PharmD and provider.   Interventions: 1:1 collaboration with Tower, MWynelle Fanny MD regarding development and update of comprehensive plan of care as evidenced by provider attestation and co-signature Inter-disciplinary care team collaboration (see longitudinal plan of care) Comprehensive medication review performed; medication list updated in electronic medical record  Hypertension (BP goal <140/90) -Improving - BP improved on higher dose of lisinopril, still above goal on occasion -Current home BP readings: BP monitor broke -Current treatment: Lisinopril 20 mg daily - Appropriate, Query Effective -Medications previously tried: none reported -Current dietary habits:   -Breakfast - 1 egg, bacon, toast  -Snack - sandwich  -Lunch - baked chicken, pork chops, green beans  -Dinner - meat and country vegetables  -Snacks - pack of crackers  -Drinks - 1 cup of coffee, 3 bottles of water, occasional  soda -Current exercise habits: mows her yard, cleans home, flower garden -Educated on BP goals and benefits of medications for prevention of heart attack, stroke and kidney damage; -Counseled to check BP daily, keep log and bring to upcoming PCP appt -Recommend to continue current medication for now  Hyperlipidemia: (LDL goal < 70) -Not ideally controlled - LDL 83 (04/2021), continues to improve each year -Hx aortic calcification -Current treatment: Atorvastatin 80 mg daily HS - Appropriate, Query Effective, Safe, Accessible Ezetimibe 10 mg daily AM -Appropriate, Query Effective, Safe, Accessible -Medications previously tried: none  -Current dietary patterns: discussed limiting red meats and fatty cuts  -Educated on Cholesterol goals;  -Recommended to continue current medication  Depression/Anxiety/Sleep distubrance (Goal: Improve mood, improve sleep onset) -Improving- per patient report, she has been sleeping better lately, she has been taking trazodone 50 mg more regularly -Mood is stable, but still difficulty with sleep onset; she reports improvement in urinary issues since reducing nortriptyline dose -Current treatment: Lorazepam 1 mg PRN sleep (takes every night) - Appropriate, Effective, Safe, Accessible Nortriptyline 75 mg daily HS - Appropriate, Query Effective Trazodone 50 mg HS prn - Appropriate, Query Effective -Medications previously tried/failed: melatonin, antihistamines, Restoril (  ineffective) -Recommend increasing trazodone to 100 mg (2 tablets) at bedtime as needed  Osteoporosis (Goal Improve bone density) -Query controlled - pt has not had repeat DEXA since Prolia was started in 2019 -Patient denies any falls since ED visit 11/16/20 with right patella fracture. She does have some lightheadedness at times. Reports balance is fair. She has a walker but does not always use it. -Last DEXA Scan: 10/2017   T-Score total hip: -3.6  T-Score lumbar spine: -3.4 -Patient is a  candidate for pharmacologic treatment due to T-Score < -2.5 in total hip  and T-Score < -2.5 in lumbar spine -Current treatment  Calcium-vitamin D - daily  Prolia 60 mg q6 mos (started 11/2017, last 07/28/21) -Medications previously tried: alendronate, raloxifene -Recommend weight-bearing and muscle strengthening exercises for building and maintaining bone density. -Recommended to continue current medication; consider repeat DEXA this year  Patient Goals/Self-Care Activities Patient will:  - take medications as prescribed as evidenced by patient report and record review focus on medication adherence by pill box check blood pressure daily, document, and provide at future appointments

## 2022-04-11 ENCOUNTER — Telehealth: Payer: Self-pay | Admitting: Family Medicine

## 2022-04-11 DIAGNOSIS — E538 Deficiency of other specified B group vitamins: Secondary | ICD-10-CM

## 2022-04-11 DIAGNOSIS — M8000XS Age-related osteoporosis with current pathological fracture, unspecified site, sequela: Secondary | ICD-10-CM

## 2022-04-11 DIAGNOSIS — E7800 Pure hypercholesterolemia, unspecified: Secondary | ICD-10-CM

## 2022-04-11 DIAGNOSIS — I10 Essential (primary) hypertension: Secondary | ICD-10-CM

## 2022-04-11 DIAGNOSIS — R7303 Prediabetes: Secondary | ICD-10-CM

## 2022-04-11 DIAGNOSIS — E890 Postprocedural hypothyroidism: Secondary | ICD-10-CM

## 2022-04-11 NOTE — Telephone Encounter (Signed)
-----   Message from Ellamae Sia sent at 03/30/2022  3:06 PM EDT ----- Regarding: lab orders for Monday, 8.7.23 Patient is scheduled for CPX labs, please order future labs, Thanks , Karna Christmas

## 2022-04-12 ENCOUNTER — Other Ambulatory Visit: Payer: Medicare HMO

## 2022-04-12 ENCOUNTER — Other Ambulatory Visit (INDEPENDENT_AMBULATORY_CARE_PROVIDER_SITE_OTHER): Payer: Medicare HMO

## 2022-04-12 DIAGNOSIS — E538 Deficiency of other specified B group vitamins: Secondary | ICD-10-CM

## 2022-04-12 DIAGNOSIS — E7800 Pure hypercholesterolemia, unspecified: Secondary | ICD-10-CM

## 2022-04-12 DIAGNOSIS — M8000XS Age-related osteoporosis with current pathological fracture, unspecified site, sequela: Secondary | ICD-10-CM

## 2022-04-12 DIAGNOSIS — I10 Essential (primary) hypertension: Secondary | ICD-10-CM

## 2022-04-12 DIAGNOSIS — M8000XA Age-related osteoporosis with current pathological fracture, unspecified site, initial encounter for fracture: Secondary | ICD-10-CM

## 2022-04-12 DIAGNOSIS — R7303 Prediabetes: Secondary | ICD-10-CM | POA: Diagnosis not present

## 2022-04-12 DIAGNOSIS — E890 Postprocedural hypothyroidism: Secondary | ICD-10-CM | POA: Diagnosis not present

## 2022-04-12 DIAGNOSIS — H2512 Age-related nuclear cataract, left eye: Secondary | ICD-10-CM | POA: Diagnosis not present

## 2022-04-12 LAB — CBC WITH DIFFERENTIAL/PLATELET
Basophils Absolute: 0 10*3/uL (ref 0.0–0.1)
Basophils Relative: 0.8 % (ref 0.0–3.0)
Eosinophils Absolute: 0.1 10*3/uL (ref 0.0–0.7)
Eosinophils Relative: 2 % (ref 0.0–5.0)
HCT: 39.2 % (ref 36.0–46.0)
Hemoglobin: 13.3 g/dL (ref 12.0–15.0)
Lymphocytes Relative: 38.5 % (ref 12.0–46.0)
Lymphs Abs: 2.1 10*3/uL (ref 0.7–4.0)
MCHC: 33.8 g/dL (ref 30.0–36.0)
MCV: 94.8 fl (ref 78.0–100.0)
Monocytes Absolute: 0.4 10*3/uL (ref 0.1–1.0)
Monocytes Relative: 6.3 % (ref 3.0–12.0)
Neutro Abs: 2.9 10*3/uL (ref 1.4–7.7)
Neutrophils Relative %: 52.4 % (ref 43.0–77.0)
Platelets: 270 10*3/uL (ref 150.0–400.0)
RBC: 4.14 Mil/uL (ref 3.87–5.11)
RDW: 14.6 % (ref 11.5–15.5)
WBC: 5.5 10*3/uL (ref 4.0–10.5)

## 2022-04-12 LAB — VITAMIN B12: Vitamin B-12: 444 pg/mL (ref 211–911)

## 2022-04-12 LAB — HEMOGLOBIN A1C: Hgb A1c MFr Bld: 6.4 % (ref 4.6–6.5)

## 2022-04-12 LAB — TSH: TSH: 2.81 u[IU]/mL (ref 0.35–5.50)

## 2022-04-12 LAB — VITAMIN D 25 HYDROXY (VIT D DEFICIENCY, FRACTURES): VITD: 36.03 ng/mL (ref 30.00–100.00)

## 2022-04-13 ENCOUNTER — Ambulatory Visit
Admission: RE | Admit: 2022-04-13 | Discharge: 2022-04-13 | Disposition: A | Discharge status: Home / Self Care | Payer: Medicare HMO | Source: Ambulatory Visit | Attending: Family Medicine | Admitting: Family Medicine

## 2022-04-13 DIAGNOSIS — M810 Age-related osteoporosis without current pathological fracture: Secondary | ICD-10-CM | POA: Diagnosis not present

## 2022-04-13 DIAGNOSIS — E2839 Other primary ovarian failure: Secondary | ICD-10-CM | POA: Diagnosis not present

## 2022-04-13 LAB — COMPREHENSIVE METABOLIC PANEL
ALT: 15 U/L (ref 0–35)
AST: 22 U/L (ref 0–37)
Albumin: 4.3 g/dL (ref 3.5–5.2)
Alkaline Phosphatase: 39 U/L (ref 39–117)
BUN: 16 mg/dL (ref 6–23)
CO2: 26 mEq/L (ref 19–32)
Calcium: 9 mg/dL (ref 8.4–10.5)
Chloride: 103 mEq/L (ref 96–112)
Creatinine, Ser: 0.85 mg/dL (ref 0.40–1.20)
GFR: 66.47 mL/min (ref >60.00)
Glucose, Bld: 87 mg/dL (ref 70–99)
Potassium: 4.6 mEq/L (ref 3.5–5.1)
Sodium: 144 mEq/L (ref 135–145)
Total Bilirubin: 0.4 mg/dL (ref 0.2–1.2)
Total Protein: 6.4 g/dL (ref 6.0–8.3)

## 2022-04-13 LAB — LIPID PANEL
Cholesterol: 168 mg/dL (ref 0–200)
HDL: 68.9 mg/dL (ref >39.00)
LDL Cholesterol: 89 mg/dL (ref 0–99)
NonHDL: 99.29
Total CHOL/HDL Ratio: 2
Triglycerides: 52 mg/dL (ref 0.0–149.0)
VLDL: 10.4 mg/dL (ref 0.0–40.0)

## 2022-04-14 NOTE — Discharge Instructions (Signed)

## 2022-04-17 NOTE — Anesthesia Preprocedure Evaluation (Addendum)
Anesthesia Evaluation  Patient identified by MRN, date of birth, ID band Patient awake    Reviewed: Allergy & Precautions, NPO status , Patient's Chart, lab work & pertinent test results  History of Anesthesia Complications Negative for: history of anesthetic complications  Airway Mallampati: III  TM Distance: >3 FB Neck ROM: full    Dental  (+) Chipped   Pulmonary COPD, former smoker,    Pulmonary exam normal        Cardiovascular hypertension, CAD: seen on ct scan.  Normal cardiovascular exam  LHC-  The left ventricular ejection fraction is 55-65% by visual estimate.  LV end diastolic pressure is normal.  The left ventricular systolic function is normal.  There is no mitral valve regurgitation. No significant coronary disease noted, moderate diffuse calcification noted     Neuro/Psych PSYCHIATRIC DISORDERS Depression negative neurological ROS     GI/Hepatic Neg liver ROS, GERD  ,  Endo/Other  Hypothyroidism   Renal/GU      Musculoskeletal   Abdominal   Peds  Hematology negative hematology ROS (+)   Anesthesia Other Findings Past Medical History: No date: Aortic calcification (HCC) No date: COPD (chronic obstructive pulmonary disease) (HCC) No date: Depression No date: Hyperlipidemia No date: Hypertension No date: Hypothyroidism No date: Osteoarthritis No date: Osteoporosis No date: Shingles     Comment:  arm 1/12 No date: Sleep disorder No date: Tobacco abuse     Comment:  past; quit 09  Past Surgical History: No date: ABDOMINAL HYSTERECTOMY No date: BIOPSY THYROID     Comment:  pos neoplasm, surgery 09/2006 06/1998: bunions 03/15/2018: COLONOSCOPY WITH PROPOFOL; N/A     Comment:  Procedure: COLONOSCOPY WITH PROPOFOL;  Surgeon:               Virgel Manifold, MD;  Location: [facility] ENDOSCOPY;                Service: Endoscopy;  Laterality: N/A; No date: CORONARY ANGIOPLASTY 12/2006: CXR-copd,  ts partial compression fracture 12/1996: dexa 03/15/2018: ESOPHAGOGASTRODUODENOSCOPY (EGD) WITH PROPOFOL; N/A     Comment:  Procedure: ESOPHAGOGASTRODUODENOSCOPY (EGD) WITH               PROPOFOL;  Surgeon: Virgel Manifold, MD;  Location:               [facility] ENDOSCOPY;  Service: Endoscopy;  Laterality: N/A; 05/16/2019: GANGLION CYST EXCISION; Right     Comment:  Procedure: REMOVAL GANGLION OF WRIST;  Surgeon: Corky Mull, MD;  Location: [facility] ORS;  Service: Orthopedics;                Laterality: Right; 02/1997: hashimoto No date: hyerectomy- cervical ca cells 12/23/2017: LEFT HEART CATH AND CORONARY ANGIOGRAPHY; Left     Comment:  Procedure: LEFT HEART CATH AND CORONARY ANGIOGRAPHY;                Surgeon: Minna Merritts, MD;  Location: [facility];  Service: Cardiovascular;  Laterality: Left; No date: left wrist fracture     Comment:  surgery 12/2006: right thyroid lobectomy, goiter, thyroiditis No date: stress fractures foot 11/2009: THYROIDECTOMY  BMI    Body Mass Index: 19.49 kg/m      Reproductive/Obstetrics negative OB ROS  Anesthesia Physical Anesthesia Plan  ASA: 2  Anesthesia Plan: MAC   Post-op Pain Management: Minimal or no pain anticipated   Induction: Intravenous  PONV Risk Score and Plan: Treatment may vary due to age or medical condition  Airway Management Planned: Natural Airway  Additional Equipment:   Intra-op Plan:   Post-operative Plan:   Informed Consent:   Plan Discussed with: CRNA  Anesthesia Plan Comments:         Anesthesia Quick Evaluation

## 2022-04-19 ENCOUNTER — Ambulatory Visit (INDEPENDENT_AMBULATORY_CARE_PROVIDER_SITE_OTHER): Payer: Medicare HMO | Admitting: Family Medicine

## 2022-04-19 ENCOUNTER — Encounter: Payer: Self-pay | Admitting: Family Medicine

## 2022-04-19 VITALS — BP 102/64 | HR 88 | Ht 62.5 in | Wt 105.4 lb

## 2022-04-19 DIAGNOSIS — I10 Essential (primary) hypertension: Secondary | ICD-10-CM | POA: Diagnosis not present

## 2022-04-19 DIAGNOSIS — E7800 Pure hypercholesterolemia, unspecified: Secondary | ICD-10-CM

## 2022-04-19 DIAGNOSIS — K219 Gastro-esophageal reflux disease without esophagitis: Secondary | ICD-10-CM

## 2022-04-19 DIAGNOSIS — E538 Deficiency of other specified B group vitamins: Secondary | ICD-10-CM

## 2022-04-19 DIAGNOSIS — R7303 Prediabetes: Secondary | ICD-10-CM

## 2022-04-19 DIAGNOSIS — Z0000 Encounter for general adult medical examination without abnormal findings: Secondary | ICD-10-CM

## 2022-04-19 DIAGNOSIS — M8000XS Age-related osteoporosis with current pathological fracture, unspecified site, sequela: Secondary | ICD-10-CM | POA: Diagnosis not present

## 2022-04-19 DIAGNOSIS — E890 Postprocedural hypothyroidism: Secondary | ICD-10-CM

## 2022-04-19 DIAGNOSIS — F419 Anxiety disorder, unspecified: Secondary | ICD-10-CM | POA: Diagnosis not present

## 2022-04-19 DIAGNOSIS — R110 Nausea: Secondary | ICD-10-CM

## 2022-04-19 DIAGNOSIS — I700 Atherosclerosis of aorta: Secondary | ICD-10-CM

## 2022-04-19 MED ORDER — FAMOTIDINE 20 MG PO TABS: 40.0000 mg | ORAL_TABLET | Freq: Two times a day (BID) | ORAL | 0 refills | Status: DC

## 2022-04-19 MED ORDER — CYANOCOBALAMIN 1000 MCG/ML IJ SOLN
1000.0000 ug | Freq: Once | INTRAMUSCULAR | Status: AC
  Administered 2022-04-19: 1000 ug via INTRAMUSCULAR

## 2022-04-19 MED ORDER — LEVOTHYROXINE SODIUM 75 MCG PO TABS: 75.0000 ug | ORAL_TABLET | Freq: Every day | ORAL | 3 refills | Status: DC

## 2022-04-19 NOTE — Assessment & Plan Note (Signed)
Reviewed health habits including diet and exercise and skin cancer prevention Reviewed appropriate screening tests for age  Also reviewed health mt list, fam hx and immunization status , as well as social and family history   Declines shingrix vaccine  Colonoscopy utd 2019 Declines mammogram  dexa utd this month  phq 0

## 2022-04-19 NOTE — Assessment & Plan Note (Signed)
Lab Results  Component Value Date   [id number] 409 04/12/2022   Continue shots  One given today

## 2022-04-19 NOTE — Assessment & Plan Note (Signed)
Disc goals for lipids and reasons to control them Rev last labs with pt Rev low sat fat diet in detail Well controlled LDL of 89   Plan to continue atorvastatin 80 mg daily and zetia 10 mg daily

## 2022-04-19 NOTE — Assessment & Plan Note (Signed)
bp in fair control at this time  BP Readings from Last 1 Encounters:  04/19/22 102/64   No changes needed Most recent labs reviewed  Disc lifstyle change with low sodium diet and exercise  Plan to continue lisinopril 20 mg daily  Watch for dizziness/low bp

## 2022-04-19 NOTE — Patient Instructions (Addendum)
Try to eat a balanced diet  You can do some protein shakes in between meals to gain weight   Go up on the generic pepcid to 2 pills (40 mg) twice daily for the next month  I sent in the month supply  When done with that let me know how you are and we will go back to 20 mg twice daily   If nausea does not improve or if diarrhea returns let us know    Continue other medicines B 12 shot today

## 2022-04-19 NOTE — Assessment & Plan Note (Signed)
Hypothyroidism  Pt has no clinical changes No change in energy level/ hair or skin/ edema and no tremor Lab Results  Component Value Date   TSH 2.81 04/12/2022    Plan to continue levothyroxine 75 mcg daily

## 2022-04-19 NOTE — Assessment & Plan Note (Signed)
More nausea lately- started 2 wk ago also with diarrhea Suspect this was viral or food induced  Improved -diarrhea is resolved  Will inc pepcid to 40 mg for a month to help any poss gastritis and then drop back to 20  inst to update if no further improvement

## 2022-04-19 NOTE — Assessment & Plan Note (Signed)
Since GI illness 2 wk ago with diarrhea (resolved) ? If food bourne or viral  Recommend inc pepcid to 40 mg for 1 mo for poss gastritis  Then drop back to 20  inst to update if not imp

## 2022-04-19 NOTE — Assessment & Plan Note (Signed)
Lab Results  Component Value Date   HGBA1C 6.4 04/12/2022   disc imp of low glycemic diet and wt loss to prevent DM2

## 2022-04-19 NOTE — Progress Notes (Signed)
Subjective:    Patient ID: [name] [name], female    DOB: November 08, 1945, 76 y.o.   MRN: [id number]  HPI Here for health maintenance exam and to review chronic medical problems    Wt Readings from Last 3 Encounters:  04/19/22 105 lb 6.4 oz (47.8 kg)  02/17/22 109 lb (49.4 kg)  02/02/22 109 lb 2 oz (49.5 kg)   18.97 kg/m Wt is down  Eating less due to nausea    Had a good garden this summer  Careful of the heat  Feeling ok   A little nausea lately - for 2 weeks (unsure if she ate something bad)  Vomited once -that irritated her throat 2 wk ago /diarrhea then also  No blood in stool  She takes pepcid Sometimes some heartburn  Stomach did hurt- now that is better  Immunization History  Administered Date(s) Administered   Fluad Quad(high Dose 65+) 08/07/2020, 08/11/2021   Influenza Split 05/24/2012   Influenza Whole 06/16/2007   Influenza,inj,Quad PF,6+ Mos 07/04/2013, 06/28/2014, 07/25/2015, 06/04/2016, 08/31/2017, 10/16/2018   Pneumococcal Conjugate-13 08/08/2015   Pneumococcal Polysaccharide-23 11/10/2007, 06/28/2014   Td 11/10/2007   Health Maintenance Due  Topic Date Due   INFLUENZA VACCINE  04/06/2022   Pt declines shingrix and covid imms  Utd pna imm  Colonoscopy 03/2018   Mammogram - declines  Self breast exam: no lumps   Dexa -just had it /mixed trend  Falls: none  Fractures:none new  Supplements - ca and D  Exercise : working out in the garden/some lifting  Not a lot of walking /she loves it but no one to walk with  Prolia treatment   Mood/anx    04/19/2022    2:05 PM 02/17/2022    9:24 AM 04/07/2021   12:20 PM 02/16/2021    9:03 AM 12/12/2019   12:00 PM  Depression screen PHQ 2/9  Decreased Interest 0 0 0 3 0  Down, Depressed, Hopeless 0 0 0 3 1  PHQ - 2 Score 0 0 0 6 1  Altered sleeping  1  3 0  Tired, decreased energy  1  3 0  Change in appetite  0  0 0  Feeling bad or failure about yourself   0  0 0  Trouble concentrating  0  0 0  Moving  slowly or fidgety/restless  0  0 0  Suicidal thoughts  0  0 0  PHQ-9 Score  '2  12 1  '$ Difficult doing work/chores  Not difficult at all  Somewhat difficult Not difficult at all   Taking nortriptyline 75 mg daily  Trazodone for sleep Still struggles with this   No caffeine   Has cataract surgery tomorrow Also scarring in L eye   HTN bp is stable today  No cp or palpitations or headaches or edema  No side effects to medicines   bp is stable today  No cp or palpitations or headaches or edema  No side effects to medicines  BP Readings from Last 3 Encounters:  04/19/22 102/64  02/02/22 138/80  08/25/21 (!) 179/81     Isinopril 20 mg daily   Lab Results  Component Value Date   CREATININE 0.85 04/12/2022   BUN 16 04/12/2022   NA 144 04/12/2022   K 4.6 04/12/2022   CL 103 04/12/2022   CO2 26 04/12/2022   GERD Papcid 20 mg bid   Hypothyroidism  Pt has no clinical changes No change in energy level/ hair or skin/  edema and no tremor Lab Results  Component Value Date   TSH 2.81 04/12/2022    Levothyroxine 75 mcg daily   Hyperlipidemia Lab Results  Component Value Date   CHOL 168 04/12/2022   CHOL 157 04/14/2021   CHOL 180 01/17/2020   Lab Results  Component Value Date   HDL 68.90 04/12/2022   HDL 59.90 04/14/2021   HDL 67.30 01/17/2020   Lab Results  Component Value Date   LDLCALC 89 04/12/2022   LDLCALC 83 04/14/2021   LDLCALC 90 01/17/2020   Lab Results  Component Value Date   TRIG 52.0 04/12/2022   TRIG 67.0 04/14/2021   TRIG 114.0 01/17/2020   Lab Results  Component Value Date   CHOLHDL 2 04/12/2022   CHOLHDL 3 04/14/2021   CHOLHDL 3 01/17/2020   Lab Results  Component Value Date   LDLDIRECT 140.9 07/04/2013   LDLDIRECT 140.9 07/10/2012   LDLDIRECT 208.7 05/24/2012   Atorvastatin 80 mg daily  Zetia 10   B12 def Due for shot today Lab Results  Component Value Date   VITAMINB12 444 04/12/2022   Prediabetes Lab Results  Component  Value Date   HGBA1C 6.4 04/12/2022   Lab Results  Component Value Date   ALT 15 04/12/2022   AST 22 04/12/2022   ALKPHOS 39 04/12/2022   BILITOT 0.4 04/12/2022   Lab Results  Component Value Date   WBC 5.5 04/12/2022   HGB 13.3 04/12/2022   HCT 39.2 04/12/2022   MCV 94.8 04/12/2022   PLT 270.0 04/12/2022   Patient Active Problem List   Diagnosis Date Noted   Nausea 04/19/2022   Decreased GFR 02/02/2022   GERD (gastroesophageal reflux disease) 02/02/2022   Microscopic hematuria 02/02/2022   Urinary frequency 04/14/2021   Low serum vitamin B12 08/07/2020   Prediabetes 03/07/2020   Tingling in extremities 03/07/2020   Neck pain 11/05/2019   Ganglion cyst of tendon sheath of right hand 05/17/2019   Medicare annual wellness visit, subsequent 10/16/2018   Benign neoplasm of ascending colon    Benign neoplasm of transverse colon    Melanosis, colon    Other specified diseases of esophagus    Stomach irritation    Constipation 01/24/2018   Compression fracture of L1 vertebra ([place]) 01/24/2018   Anxiety 01/09/2018   Coronary artery calcification seen on CAT scan 11/25/2017   Special screening for malignant neoplasms, colon 11/01/2017   Estrogen deficiency 09/26/2017   Aortic calcification ([place]) 11/30/2016   History of patellar fracture 11/30/2016   Routine general medical examination at a health care facility 06/27/2016   Loss of weight 06/04/2016   History of vertebral compression fracture 01/19/2016   Esophageal dysphagia 06/28/2014   Chronic foot pain 10/27/2011   Low back pain 04/14/2011   FEVER BLISTER 11/17/2010   Essential hypertension 08/18/2009   BUNION 03/04/2009   Allergic rhinitis 01/10/2008   H/O goiter 02/09/2007   Hypothyroidism 02/09/2007   HYPERCHOLESTEROLEMIA 02/09/2007   History of depression 02/09/2007   OSTEOARTHRITIS 02/09/2007   Osteoporosis 02/09/2007   Disturbance in sleep behavior 02/09/2007   Past Medical History:  Diagnosis Date    Aortic calcification (HCC)    COPD (chronic obstructive pulmonary disease) ([place])    Depression    Hyperlipidemia    Hypertension    Hypothyroidism    Osteoarthritis    Osteoporosis    Shingles    arm 1/12   Sleep disorder    Tobacco abuse    past; quit 09  Past Surgical History:  Procedure Laterality Date   ABDOMINAL HYSTERECTOMY     BIOPSY THYROID     pos neoplasm, surgery 09/2006   bunions  06/1998   COLONOSCOPY WITH PROPOFOL N/A 03/15/2018   Procedure: COLONOSCOPY WITH PROPOFOL;  Surgeon: Virgel Manifold, MD;  Location: [facility];  Service: Endoscopy;  Laterality: N/A;   CORONARY ANGIOPLASTY     CXR-copd, ts partial compression fracture  12/2006   dexa  12/1996   ESOPHAGOGASTRODUODENOSCOPY (EGD) WITH PROPOFOL N/A 03/15/2018   Procedure: ESOPHAGOGASTRODUODENOSCOPY (EGD) WITH PROPOFOL;  Surgeon: Virgel Manifold, MD;  Location: [facility];  Service: Endoscopy;  Laterality: N/A;   GANGLION CYST EXCISION Right 05/16/2019   Procedure: REMOVAL GANGLION OF WRIST;  Surgeon: Corky Mull, MD;  Location: [facility];  Service: Orthopedics;  Laterality: Right;   hashimoto  02/1997   hyerectomy- cervical ca cells     LEFT HEART CATH AND CORONARY ANGIOGRAPHY Left 12/23/2017   Procedure: LEFT HEART CATH AND CORONARY ANGIOGRAPHY;  Surgeon: Minna Merritts, MD;  Location: [facility];  Service: Cardiovascular;  Laterality: Left;   left wrist fracture     surgery   right thyroid lobectomy, goiter, thyroiditis  12/2006   stress fractures foot     THYROIDECTOMY  11/2009   Social History   Tobacco Use   Smoking status: Former    Packs/day: 0.50    Types: Cigarettes    Quit date: 08/07/2019    Years since quitting: 2.7   Smokeless tobacco: Never  Vaping Use   Vaping Use: Never used  Substance Use Topics   Alcohol use: No    Alcohol/week: 0.0 standard drinks of alcohol   Drug use: No   Family History  Problem Relation Age of Onset   Stroke Father    Hypertension  Father    Diabetes Mother    Coronary artery disease Mother    Allergies  Allergen Reactions   Fosamax [Alendronate Sodium]     Dysphagia and heartburn    Raloxifene Hcl Other (See Comments)    Leg pain and cramps  Leg pain and cramps    Current Outpatient Medications on File Prior to Visit  Medication Sig Dispense Refill   albuterol (VENTOLIN HFA) 108 (90 Base) MCG/ACT inhaler Inhale 2 puffs into the lungs every 4 (four) hours as needed for wheezing or shortness of breath.     atorvastatin (LIPITOR) 80 MG tablet TAKE 1 TABLET EVERY DAY 90 tablet 1   Calcium Carbonate-Vit D-Min (CALCIUM 1200 PO) Take 1 tablet by mouth daily.      cyclobenzaprine (FLEXERIL) 10 MG tablet TAKE 1 TABLET BY MOUTH EVERY DAY AT BEDTIME AS NEEDED 90 tablet 0   ezetimibe (ZETIA) 10 MG tablet TAKE 1 TABLET EVERY DAY 90 tablet 1   lidocaine (XYLOCAINE) 2 % solution Use as directed 15 mLs in the mouth or throat as needed for mouth pain. 100 mL 0   lisinopril (ZESTRIL) 20 MG tablet TAKE 1 TABLET EVERY DAY 90 tablet 1   LORazepam (ATIVAN) 1 MG tablet TAKE 1 TABLET AT BEDTIME AS NEEDED FOR SLEEP 90 tablet 0   nortriptyline (PAMELOR) 75 MG capsule TAKE 1 CAPSULE AT BEDTIME 90 capsule 1   traZODone (DESYREL) 50 MG tablet Take 0.5-1 tablets (25-50 mg total) by mouth at bedtime as needed for sleep. 90 tablet 0   No current facility-administered medications on file prior to visit.     Review of Systems  Constitutional:  Negative for  activity change, appetite change, fatigue, fever and unexpected weight change.  HENT:  Negative for congestion, ear pain, rhinorrhea, sinus pressure and sore throat.   Eyes:  Negative for pain, redness and visual disturbance.  Respiratory:  Negative for cough, shortness of breath and wheezing.   Cardiovascular:  Negative for chest pain and palpitations.  Gastrointestinal:  Negative for abdominal pain, blood in stool, constipation and diarrhea.  Endocrine: Negative for polydipsia and  polyuria.  Genitourinary:  Negative for dysuria, frequency and urgency.  Musculoskeletal:  Negative for arthralgias, back pain and myalgias.  Skin:  Negative for pallor and rash.  Allergic/Immunologic: Negative for environmental allergies.  Neurological:  Negative for dizziness, syncope and headaches.  Hematological:  Negative for adenopathy. Does not bruise/bleed easily.  Psychiatric/Behavioral:  Positive for sleep disturbance. Negative for decreased concentration and dysphoric mood. The patient is not nervous/anxious.        Objective:   Physical Exam Constitutional:      General: She is not in acute distress.    Appearance: Normal appearance. She is well-developed and normal weight. She is not ill-appearing or diaphoretic.     Comments: Underweight   HENT:     Head: Normocephalic and atraumatic.     Right Ear: Tympanic membrane, ear canal and external ear normal.     Left Ear: Tympanic membrane, ear canal and external ear normal.     Nose: Nose normal. No congestion.     Mouth/Throat:     Mouth: Mucous membranes are moist.     Pharynx: Oropharynx is clear. No posterior oropharyngeal erythema.  Eyes:     General: No scleral icterus.       Right eye: No discharge.        Left eye: No discharge.     Extraocular Movements: Extraocular movements intact.     Conjunctiva/sclera: Conjunctivae normal.     Pupils: Pupils are equal, round, and reactive to light.  Neck:     Thyroid: No thyromegaly.     Vascular: No carotid bruit or JVD.  Cardiovascular:     Rate and Rhythm: Normal rate and regular rhythm.     Pulses: Normal pulses.     Heart sounds: Normal heart sounds.     No gallop.  Pulmonary:     Effort: Pulmonary effort is normal. No respiratory distress.     Breath sounds: Normal breath sounds. No wheezing.     Comments: Good air exch Chest:     Chest wall: No tenderness.  Abdominal:     General: Bowel sounds are normal. There is no distension or abdominal bruit.      Palpations: Abdomen is soft. There is no mass.     Tenderness: There is no abdominal tenderness.     Hernia: No hernia is present.  Genitourinary:    Comments: Breast exam: No mass, nodules, thickening, tenderness, bulging, retraction, inflamation, nipple discharge or skin changes noted.  No axillary or clavicular LA.     Musculoskeletal:        General: No tenderness. Normal range of motion.     Cervical back: Normal range of motion and neck supple. No rigidity. No muscular tenderness.     Right lower leg: No edema.     Left lower leg: No edema.     Comments: Moderate kyphosis   Lymphadenopathy:     Cervical: No cervical adenopathy.  Skin:    General: Skin is warm and dry.     Coloration: Skin is not pale.  Findings: No erythema or rash.  Neurological:     Mental Status: She is alert. Mental status is at baseline.     Cranial Nerves: No cranial nerve deficit.     Motor: No abnormal muscle tone.     Coordination: Coordination normal.     Gait: Gait normal.     Deep Tendon Reflexes: Reflexes are normal and symmetric. Reflexes normal.  Psychiatric:        Mood and Affect: Mood normal.        Cognition and Memory: Cognition and memory normal.           Assessment & Plan:   Problem List Items Addressed This Visit       Cardiovascular and Mediastinum   Aortic calcification (HCC)    Good cholesterol and bp control  No clinical changes       Essential hypertension    bp in fair control at this time  BP Readings from Last 1 Encounters:  04/19/22 102/64  No changes needed Most recent labs reviewed  Disc lifstyle change with low sodium diet and exercise  Plan to continue lisinopril 20 mg daily  Watch for dizziness/low bp         Digestive   GERD (gastroesophageal reflux disease)    More nausea lately- started 2 wk ago also with diarrhea Suspect this was viral or food induced  Improved -diarrhea is resolved  Will inc pepcid to 40 mg for a month to help any poss  gastritis and then drop back to 20  inst to update if no further improvement       Relevant Medications   famotidine (PEPCID) 20 MG tablet     Endocrine   Hypothyroidism    Hypothyroidism  Pt has no clinical changes No change in energy level/ hair or skin/ edema and no tremor Lab Results  Component Value Date   TSH 2.81 04/12/2022    Plan to continue levothyroxine 75 mcg daily       Relevant Medications   levothyroxine (SYNTHROID) 75 MCG tablet     Musculoskeletal and Integument   Osteoporosis    dexa - this month  Mixed trend/ OP No recent fx (spine in past) On prolia and tolerating it well Taking ca and D  Exercise is fair  Disc fall prev        Other   Anxiety    Lifelong struggle with sleep   Plan to continue nortriptyline 75 mg daily and trazodone 50 mg prn  Reviewed stressors/ coping techniques/symptoms/ support sources/ tx options and side effects in detail today       HYPERCHOLESTEROLEMIA    Disc goals for lipids and reasons to control them Rev last labs with pt Rev low sat fat diet in detail Well controlled LDL of 89   Plan to continue atorvastatin 80 mg daily and zetia 10 mg daily        Low serum vitamin B12    Lab Results  Component Value Date   VITAMINB12 444 04/12/2022  Continue shots  One given today      Nausea    Since GI illness 2 wk ago with diarrhea (resolved) ? If food bourne or viral  Recommend inc pepcid to 40 mg for 1 mo for poss gastritis  Then drop back to 20  inst to update if not imp      Prediabetes    Lab Results  Component Value Date   HGBA1C 6.4 04/12/2022  disc imp  of low glycemic diet and wt loss to prevent DM2       Routine general medical examination at a health care facility - Primary    Reviewed health habits including diet and exercise and skin cancer prevention Reviewed appropriate screening tests for age  Also reviewed health mt list, fam hx and immunization status , as well as social and family  history   Declines shingrix vaccine  Colonoscopy utd 2019 Declines mammogram  dexa utd this month  phq 0

## 2022-04-19 NOTE — Assessment & Plan Note (Signed)
dexa - this month  Mixed trend/ OP No recent fx (spine in past) On prolia and tolerating it well Taking ca and D  Exercise is fair  Disc fall prev

## 2022-04-19 NOTE — Assessment & Plan Note (Signed)
Lifelong struggle with sleep   Plan to continue nortriptyline 75 mg daily and trazodone 50 mg prn  Reviewed stressors/ coping techniques/symptoms/ support sources/ tx options and side effects in detail today

## 2022-04-19 NOTE — Assessment & Plan Note (Signed)
Good cholesterol and bp control  No clinical changes

## 2022-04-20 ENCOUNTER — Ambulatory Visit: Payer: Medicare HMO | Admitting: Anesthesiology

## 2022-04-20 ENCOUNTER — Ambulatory Visit (AMBULATORY_SURGERY_CENTER): Payer: Medicare HMO | Admitting: Anesthesiology

## 2022-04-20 ENCOUNTER — Encounter: Payer: Self-pay | Admitting: Ophthalmology

## 2022-04-20 ENCOUNTER — Other Ambulatory Visit: Payer: Self-pay

## 2022-04-20 ENCOUNTER — Ambulatory Visit: Payer: Medicare HMO

## 2022-04-20 ENCOUNTER — Ambulatory Visit
Admission: RE | Admit: 2022-04-20 | Discharge: 2022-04-20 | Disposition: A | Discharge status: Home / Self Care | Payer: Medicare HMO | Attending: Ophthalmology | Admitting: Ophthalmology

## 2022-04-20 ENCOUNTER — Encounter
Admission: RE | Disposition: A | Discharge status: Home / Self Care | Payer: Self-pay | Source: Home / Self Care | Attending: Ophthalmology

## 2022-04-20 DIAGNOSIS — I2510 Atherosclerotic heart disease of native coronary artery without angina pectoris: Secondary | ICD-10-CM | POA: Insufficient documentation

## 2022-04-20 DIAGNOSIS — Z87891 Personal history of nicotine dependence: Secondary | ICD-10-CM | POA: Diagnosis not present

## 2022-04-20 DIAGNOSIS — I700 Atherosclerosis of aorta: Secondary | ICD-10-CM | POA: Diagnosis not present

## 2022-04-20 DIAGNOSIS — J449 Chronic obstructive pulmonary disease, unspecified: Secondary | ICD-10-CM | POA: Diagnosis not present

## 2022-04-20 DIAGNOSIS — I10 Essential (primary) hypertension: Secondary | ICD-10-CM | POA: Insufficient documentation

## 2022-04-20 DIAGNOSIS — H2512 Age-related nuclear cataract, left eye: Secondary | ICD-10-CM | POA: Diagnosis not present

## 2022-04-20 DIAGNOSIS — E785 Hyperlipidemia, unspecified: Secondary | ICD-10-CM | POA: Insufficient documentation

## 2022-04-20 DIAGNOSIS — E890 Postprocedural hypothyroidism: Secondary | ICD-10-CM | POA: Diagnosis not present

## 2022-04-20 DIAGNOSIS — K219 Gastro-esophageal reflux disease without esophagitis: Secondary | ICD-10-CM | POA: Diagnosis not present

## 2022-04-20 DIAGNOSIS — Z8541 Personal history of malignant neoplasm of cervix uteri: Secondary | ICD-10-CM | POA: Diagnosis not present

## 2022-04-20 DIAGNOSIS — F32A Depression, unspecified: Secondary | ICD-10-CM | POA: Diagnosis not present

## 2022-04-20 DIAGNOSIS — E039 Hypothyroidism, unspecified: Secondary | ICD-10-CM | POA: Diagnosis not present

## 2022-04-20 HISTORY — DX: Atherosclerosis of aorta: I70.0

## 2022-04-20 HISTORY — PX: CATARACT EXTRACTION W/PHACO: SHX586

## 2022-04-20 SURGERY — PHACOEMULSIFICATION, CATARACT, WITH IOL INSERTION
Anesthesia: Monitor Anesthesia Care | Site: Eye | Laterality: Left

## 2022-04-20 MED ORDER — SIGHTPATH DOSE#1 BSS IO SOLN
INTRAOCULAR | Status: DC | PRN
  Administered 2022-04-20: 61 mL via OPHTHALMIC

## 2022-04-20 MED ORDER — TETRACAINE HCL 0.5 % OP SOLN
1.0000 [drp] | OPHTHALMIC | Status: DC | PRN
  Administered 2022-04-20 (×3): 1 [drp] via OPHTHALMIC

## 2022-04-20 MED ORDER — ARMC OPHTHALMIC DILATING DROPS
1.0000 | OPHTHALMIC | Status: DC | PRN
  Administered 2022-04-20 (×3): 1 via OPHTHALMIC

## 2022-04-20 MED ORDER — SIGHTPATH DOSE#1 BSS IO SOLN
INTRAOCULAR | Status: DC | PRN
  Administered 2022-04-20: 1 mL via INTRAMUSCULAR

## 2022-04-20 MED ORDER — FENTANYL CITRATE (PF) 100 MCG/2ML IJ SOLN
INTRAMUSCULAR | Status: DC | PRN
  Administered 2022-04-20 (×2): 50 ug via INTRAVENOUS

## 2022-04-20 MED ORDER — SIGHTPATH DOSE#1 BSS IO SOLN
INTRAOCULAR | Status: DC | PRN
  Administered 2022-04-20: 15 mL

## 2022-04-20 MED ORDER — BRIMONIDINE TARTRATE-TIMOLOL 0.2-0.5 % OP SOLN
OPHTHALMIC | Status: DC | PRN
  Administered 2022-04-20: 1 [drp] via OPHTHALMIC

## 2022-04-20 MED ORDER — ONDANSETRON HCL 4 MG/2ML IJ SOLN
4.0000 mg | Freq: Once | INTRAMUSCULAR | Status: AC
  Administered 2022-04-20: 4 mg via INTRAVENOUS

## 2022-04-20 MED ORDER — SIGHTPATH DOSE#1 NA CHONDROIT SULF-NA HYALURON 40-17 MG/ML IO SOLN
INTRAOCULAR | Status: DC | PRN
  Administered 2022-04-20: 1 mL via INTRAOCULAR

## 2022-04-20 MED ORDER — MIDAZOLAM HCL 2 MG/2ML IJ SOLN
INTRAMUSCULAR | Status: DC | PRN
  Administered 2022-04-20: 1 mg via INTRAVENOUS

## 2022-04-20 MED ORDER — MOXIFLOXACIN HCL 0.5 % OP SOLN
OPHTHALMIC | Status: DC | PRN
  Administered 2022-04-20: 0.2 mL via OPHTHALMIC

## 2022-04-20 SURGICAL SUPPLY — 10 items
CATARACT SUITE SIGHTPATH (MISCELLANEOUS) ×2 IMPLANT
FEE CATARACT SUITE SIGHTPATH (MISCELLANEOUS) ×1 IMPLANT
GLOVE SURG ENC TEXT LTX SZ8 (GLOVE) ×2 IMPLANT
GLOVE SURG TRIUMPH 8.0 PF LTX (GLOVE) ×2 IMPLANT
LENS IOL TECNIS EYHANCE 23.0 (Intraocular Lens) ×1 IMPLANT
NDL FILTER BLUNT 18X1 1/2 (NEEDLE) ×1 IMPLANT
NEEDLE FILTER BLUNT 18X 1/2SAF (NEEDLE) ×1
NEEDLE FILTER BLUNT 18X1 1/2 (NEEDLE) ×1 IMPLANT
SYR 3ML LL SCALE MARK (SYRINGE) ×2 IMPLANT
WATER STERILE IRR 250ML POUR (IV SOLUTION) ×2 IMPLANT

## 2022-04-20 NOTE — Transfer of Care (Signed)
Immediate Anesthesia Transfer of Care Note  Patient: [name] [name]  Procedure(s) Performed: CATARACT EXTRACTION PHACO AND INTRAOCULAR LENS PLACEMENT (IOC) LEFT (Left: Eye)  Patient Location: PACU  Anesthesia Type: MAC  Level of Consciousness: awake, alert  and patient cooperative  Airway and Oxygen Therapy: Patient Spontanous Breathing and Patient connected to supplemental oxygen  Post-op Assessment: Post-op Vital signs reviewed, Patient's Cardiovascular Status Stable, Respiratory Function Stable, Patent Airway and No signs of Nausea or vomiting  Post-op Vital Signs: Reviewed and stable  Complications: There were no known notable events for this encounter.

## 2022-04-20 NOTE — Anesthesia Postprocedure Evaluation (Signed)
Anesthesia Post Note  Patient: [name] [name]  Procedure(s) Performed: CATARACT EXTRACTION PHACO AND INTRAOCULAR LENS PLACEMENT (IOC) LEFT (Left: Eye)     Patient location during evaluation: PACU Anesthesia Type: MAC Level of consciousness: awake and alert Pain management: pain level controlled Vital Signs Assessment: post-procedure vital signs reviewed and stable Respiratory status: spontaneous breathing, nonlabored ventilation and respiratory function stable Cardiovascular status: blood pressure returned to baseline and stable Postop Assessment: no apparent nausea or vomiting Anesthetic complications: no   There were no known notable events for this encounter.  [place] Ouch

## 2022-04-20 NOTE — H&P (Signed)
[facility]   Primary Care Physician:  Tower, Wynelle Fanny, MD Ophthalmologist: Dr. George Ina  Pre-Procedure History & Physical: HPI:  [name] [name] is a 76 y.o. female here for cataract surgery.   Past Medical History:  Diagnosis Date   Aortic calcification (HCC)    COPD (chronic obstructive pulmonary disease) (HCC)    Depression    Hyperlipidemia    Hypertension    Hypothyroidism    Osteoarthritis    Osteoporosis    Shingles    arm 1/12   Sleep disorder    Tobacco abuse    past; quit 09    Past Surgical History:  Procedure Laterality Date   ABDOMINAL HYSTERECTOMY     BIOPSY THYROID     pos neoplasm, surgery 09/2006   bunions  06/1998   COLONOSCOPY WITH PROPOFOL N/A 03/15/2018   Procedure: COLONOSCOPY WITH PROPOFOL;  Surgeon: Virgel Manifold, MD;  Location: [facility];  Service: Endoscopy;  Laterality: N/A;   CORONARY ANGIOPLASTY     CXR-copd, ts partial compression fracture  12/2006   dexa  12/1996   ESOPHAGOGASTRODUODENOSCOPY (EGD) WITH PROPOFOL N/A 03/15/2018   Procedure: ESOPHAGOGASTRODUODENOSCOPY (EGD) WITH PROPOFOL;  Surgeon: Virgel Manifold, MD;  Location: [facility];  Service: Endoscopy;  Laterality: N/A;   GANGLION CYST EXCISION Right 05/16/2019   Procedure: REMOVAL GANGLION OF WRIST;  Surgeon: Corky Mull, MD;  Location: [facility];  Service: Orthopedics;  Laterality: Right;   hashimoto  02/1997   hyerectomy- cervical ca cells     LEFT HEART CATH AND CORONARY ANGIOGRAPHY Left 12/23/2017   Procedure: LEFT HEART CATH AND CORONARY ANGIOGRAPHY;  Surgeon: Minna Merritts, MD;  Location: [facility];  Service: Cardiovascular;  Laterality: Left;   left wrist fracture     surgery   right thyroid lobectomy, goiter, thyroiditis  12/2006   stress fractures foot     THYROIDECTOMY  11/2009    Prior to Admission medications   Medication Sig Start Date End Date Taking? Authorizing Provider  albuterol (VENTOLIN HFA) 108 (90 Base) MCG/ACT inhaler  Inhale 2 puffs into the lungs every 4 (four) hours as needed for wheezing or shortness of breath.   Yes [provider]  atorvastatin (LIPITOR) 80 MG tablet TAKE 1 TABLET EVERY DAY 01/26/22  Yes Tower, Wynelle Fanny, MD  Calcium Carbonate-Vit D-Min (CALCIUM 1200 PO) Take 1 tablet by mouth daily.    Yes [provider]  cyclobenzaprine (FLEXERIL) 10 MG tablet TAKE 1 TABLET BY MOUTH EVERY DAY AT BEDTIME AS NEEDED 11/10/21  Yes Tower, Wynelle Fanny, MD  ezetimibe (ZETIA) 10 MG tablet TAKE 1 TABLET EVERY DAY 01/06/22  Yes Tower, Wynelle Fanny, MD  famotidine (PEPCID) 20 MG tablet Take 2 tablets (40 mg total) by mouth 2 (two) times daily. 04/19/22  Yes Tower, Wynelle Fanny, MD  levothyroxine (SYNTHROID) 75 MCG tablet Take 1 tablet (75 mcg total) by mouth daily before breakfast. 04/19/22  Yes Tower, Roque Lias A, MD  lisinopril (ZESTRIL) 20 MG tablet TAKE 1 TABLET EVERY DAY 01/26/22  Yes Tower, Wynelle Fanny, MD  LORazepam (ATIVAN) 1 MG tablet TAKE 1 TABLET AT BEDTIME AS NEEDED FOR SLEEP 01/06/22  Yes Tower, Wynelle Fanny, MD  nortriptyline (PAMELOR) 75 MG capsule TAKE 1 CAPSULE AT BEDTIME 10/13/21  Yes Tower, Wynelle Fanny, MD  traZODone (DESYREL) 50 MG tablet Take 0.5-1 tablets (25-50 mg total) by mouth at bedtime as needed for sleep. 09/08/21  Yes Tower, Wynelle Fanny, MD  lidocaine (XYLOCAINE) 2 %  solution Use as directed 15 mLs in the mouth or throat as needed for mouth pain. 05/25/21   Lavonia Drafts, MD    Allergies as of 03/02/2022 - Review Complete 02/17/2022  Allergen Reaction Noted   Fosamax [alendronate sodium]  06/28/2014   Raloxifene hcl Other (See Comments) 09/26/2017    Family History  Problem Relation Age of Onset   Stroke Father    Hypertension Father    Diabetes Mother    Coronary artery disease Mother     Social History   Socioeconomic History   Marital status: Widowed    Spouse name: Not on file   Number of children: Not on file   Years of education: Not on file   Highest education level: Not on file  Occupational  History   Not on file  Tobacco Use   Smoking status: Former    Packs/day: 0.50    Types: Cigarettes    Quit date: 08/07/2019    Years since quitting: 2.7   Smokeless tobacco: Never  Vaping Use   Vaping Use: Never used  Substance and Sexual Activity   Alcohol use: No    Alcohol/week: 0.0 standard drinks of alcohol   Drug use: No   Sexual activity: Not on file  Other Topics Concern   Not on file  Social History Narrative   Marital Status: widowed.    3 children   Lost husband to throat cancer in 8/16 .    Social Determinants of Health   Financial Resource Strain: Low Risk  (02/17/2022)   Overall Financial Resource Strain (CARDIA)    Difficulty of Paying Living Expenses: Not hard at all  Food Insecurity: No Food Insecurity (02/17/2022)   Hunger Vital Sign    Worried About Running Out of Food in the Last Year: Never true    Ran Out of Food in the Last Year: Never true  Transportation Needs: No Transportation Needs (02/17/2022)   PRAPARE - Hydrologist (Medical): No    Lack of Transportation (Non-Medical): No  Physical Activity: Sufficiently Active (02/17/2022)   Exercise Vital Sign    Days of Exercise per Week: 5 days    Minutes of Exercise per Session: 30 min  Stress: No Stress Concern Present (02/17/2022)   [place]    Feeling of Stress : Only a little  Social Connections: Moderately Isolated (02/17/2022)   Social Connection and Isolation Panel [NHANES]    Frequency of Communication with Friends and Family: More than three times a week    Frequency of Social Gatherings with Friends and Family: Once a week    Attends Religious Services: More than 4 times per year    Active Member of [employer] or Organizations: No    Attends Archivist Meetings: Never    Marital Status: Widowed  Intimate Partner Violence: Not At Risk (02/17/2022)   Humiliation, Afraid, Rape, and Kick  questionnaire    Fear of Current or Ex-Partner: No    Emotionally Abused: No    Physically Abused: No    Sexually Abused: No    Review of Systems: See HPI, otherwise negative ROS  Physical Exam: BP (!) 162/84   Pulse 73   Temp 98.1 F (36.7 C) (Temporal)   Ht 5' 2.5" (1.588 m)   Wt 47.9 kg   SpO2 100%   BMI 19.02 kg/m  General:   Alert, cooperative in NAD Head:  Normocephalic and atraumatic. Respiratory:  Normal work of breathing. Cardiovascular:  RRR  Impression/Plan: [name] [name] is here for cataract surgery.  Risks, benefits, limitations, and alternatives regarding cataract surgery have been reviewed with the patient.  Questions have been answered.  All parties agreeable.   Birder Robson, MD  04/20/2022, 10:17 AM

## 2022-04-20 NOTE — Op Note (Signed)
PREOPERATIVE DIAGNOSIS:  Nuclear sclerotic cataract of the left eye.   POSTOPERATIVE DIAGNOSIS:  Nuclear sclerotic cataract of the left eye.   OPERATIVE PROCEDURE:ORPROCALL@   SURGEON:  [name] Robson, MD.   ANESTHESIA:  Anesthesiologist: Iran Ouch, MD CRNA: Ester Rink, CRNA  1.      Managed anesthesia care. 2.     0.37m of Shugarcaine was instilled following the paracentesis   COMPLICATIONS:  None.   TECHNIQUE:   Stop and chop   DESCRIPTION OF PROCEDURE:  The patient was examined and consented in the preoperative holding area where the aforementioned topical anesthesia was applied to the left eye and then brought back to the Operating Room where the left eye was prepped and draped in the usual sterile ophthalmic fashion and a lid speculum was placed. A paracentesis was created with the side port blade and the anterior chamber was filled with viscoelastic. A near clear corneal incision was performed with the steel keratome. A continuous curvilinear capsulorrhexis was performed with a cystotome followed by the capsulorrhexis forceps. Hydrodissection and hydrodelineation were carried out with BSS on a blunt cannula. The lens was removed in a stop and chop  technique and the remaining cortical material was removed with the irrigation-aspiration handpiece. The capsular bag was inflated with viscoelastic and the Technis ZCB00 lens was placed in the capsular bag without complication. The remaining viscoelastic was removed from the eye with the irrigation-aspiration handpiece. The wounds were hydrated. The anterior chamber was flushed with BSS and the eye was inflated to physiologic pressure. 0.118mVigamox was placed in the anterior chamber. The wounds were found to be water tight. The eye was dressed with Combigan. The patient was given protective glasses to wear throughout the day and a shield with which to sleep tonight. The patient was also given drops with which to begin a drop  regimen today and will follow-up with me in one day. Implant Name Type Inv. Item Serial No. Manufacturer Lot No. LRB No. Used Action  LENS IOL TECNIS EYHANCE 23.0 - [id number] Lens LENS IOL TECNIS EYHANCE 23.0 [id number]  Left 1 Implanted    Procedure(s) with comments: CATARACT EXTRACTION PHACO AND INTRAOCULAR LENS PLACEMENT (IOC) LEFT (Left) - 10.24 0058.5  Electronically signed: WiBirder [name]/15/2023 10:41 AM

## 2022-04-21 ENCOUNTER — Encounter: Payer: Self-pay | Admitting: Ophthalmology

## 2022-04-28 DIAGNOSIS — H2511 Age-related nuclear cataract, right eye: Secondary | ICD-10-CM | POA: Diagnosis not present

## 2022-05-03 DIAGNOSIS — H353221 Exudative age-related macular degeneration, left eye, with active choroidal neovascularization: Secondary | ICD-10-CM | POA: Diagnosis not present

## 2022-05-03 NOTE — Discharge Instructions (Signed)

## 2022-05-04 ENCOUNTER — Other Ambulatory Visit: Payer: Self-pay

## 2022-05-04 ENCOUNTER — Ambulatory Visit
Admission: RE | Admit: 2022-05-04 | Discharge: 2022-05-04 | Disposition: A | Discharge status: Home / Self Care | Payer: Medicare HMO | Attending: Ophthalmology | Admitting: Ophthalmology

## 2022-05-04 ENCOUNTER — Ambulatory Visit: Payer: Medicare HMO | Admitting: Anesthesiology

## 2022-05-04 ENCOUNTER — Encounter: Payer: Self-pay | Admitting: Ophthalmology

## 2022-05-04 ENCOUNTER — Ambulatory Visit: Payer: Medicare HMO | Admitting: Cardiovascular Disease

## 2022-05-04 ENCOUNTER — Encounter
Admission: RE | Disposition: A | Discharge status: Home / Self Care | Payer: Self-pay | Source: Home / Self Care | Attending: Ophthalmology

## 2022-05-04 ENCOUNTER — Ambulatory Visit (AMBULATORY_SURGERY_CENTER): Payer: Medicare HMO | Admitting: Anesthesiology

## 2022-05-04 DIAGNOSIS — J449 Chronic obstructive pulmonary disease, unspecified: Secondary | ICD-10-CM | POA: Diagnosis not present

## 2022-05-04 DIAGNOSIS — R7303 Prediabetes: Secondary | ICD-10-CM

## 2022-05-04 DIAGNOSIS — I10 Essential (primary) hypertension: Secondary | ICD-10-CM

## 2022-05-04 DIAGNOSIS — E890 Postprocedural hypothyroidism: Secondary | ICD-10-CM | POA: Insufficient documentation

## 2022-05-04 DIAGNOSIS — I2510 Atherosclerotic heart disease of native coronary artery without angina pectoris: Secondary | ICD-10-CM

## 2022-05-04 DIAGNOSIS — K219 Gastro-esophageal reflux disease without esophagitis: Secondary | ICD-10-CM | POA: Insufficient documentation

## 2022-05-04 DIAGNOSIS — H2511 Age-related nuclear cataract, right eye: Secondary | ICD-10-CM | POA: Insufficient documentation

## 2022-05-04 DIAGNOSIS — Z87891 Personal history of nicotine dependence: Secondary | ICD-10-CM | POA: Insufficient documentation

## 2022-05-04 HISTORY — PX: CATARACT EXTRACTION W/PHACO: SHX586

## 2022-05-04 SURGERY — PHACOEMULSIFICATION, CATARACT, WITH IOL INSERTION
Anesthesia: Topical | Site: Eye | Laterality: Right

## 2022-05-04 MED ORDER — SIGHTPATH DOSE#1 BSS IO SOLN
INTRAOCULAR | Status: DC | PRN
  Administered 2022-05-04: 15 mL

## 2022-05-04 MED ORDER — MIDAZOLAM HCL 2 MG/2ML IJ SOLN
1.0000 mg | Freq: Once | INTRAMUSCULAR | Status: AC
  Administered 2022-05-04: 1 mg via INTRAVENOUS

## 2022-05-04 MED ORDER — ARMC OPHTHALMIC DILATING DROPS
1.0000 | OPHTHALMIC | Status: DC | PRN
  Administered 2022-05-04 (×3): 1 via OPHTHALMIC

## 2022-05-04 MED ORDER — SIGHTPATH DOSE#1 NA CHONDROIT SULF-NA HYALURON 40-17 MG/ML IO SOLN
INTRAOCULAR | Status: DC | PRN
  Administered 2022-05-04: 1 mL via INTRAOCULAR

## 2022-05-04 MED ORDER — TETRACAINE HCL 0.5 % OP SOLN
1.0000 [drp] | OPHTHALMIC | Status: DC | PRN
  Administered 2022-05-04 (×2): 1 [drp] via OPHTHALMIC

## 2022-05-04 MED ORDER — SIGHTPATH DOSE#1 BSS IO SOLN
INTRAOCULAR | Status: DC | PRN
  Administered 2022-05-04: 1 mL via INTRAMUSCULAR

## 2022-05-04 MED ORDER — MOXIFLOXACIN HCL 0.5 % OP SOLN
OPHTHALMIC | Status: DC | PRN
  Administered 2022-05-04: 0.2 mL via OPHTHALMIC

## 2022-05-04 MED ORDER — FENTANYL CITRATE (PF) 100 MCG/2ML IJ SOLN
INTRAMUSCULAR | Status: DC | PRN
  Administered 2022-05-04: 50 ug via INTRAVENOUS

## 2022-05-04 MED ORDER — BRIMONIDINE TARTRATE-TIMOLOL 0.2-0.5 % OP SOLN
OPHTHALMIC | Status: DC | PRN
  Administered 2022-05-04: 1 [drp] via OPHTHALMIC

## 2022-05-04 MED ORDER — SIGHTPATH DOSE#1 BSS IO SOLN
INTRAOCULAR | Status: DC | PRN
  Administered 2022-05-04: 47 mL via OPHTHALMIC

## 2022-05-04 SURGICAL SUPPLY — 10 items
CATARACT SUITE SIGHTPATH (MISCELLANEOUS) ×1 IMPLANT
FEE CATARACT SUITE SIGHTPATH (MISCELLANEOUS) ×1 IMPLANT
GLOVE SURG ENC TEXT LTX SZ8 (GLOVE) ×1 IMPLANT
GLOVE SURG TRIUMPH 8.0 PF LTX (GLOVE) ×1 IMPLANT
LENS IOL TECNIS EYHANCE 22.0 (Intraocular Lens) IMPLANT
NDL FILTER BLUNT 18X1 1/2 (NEEDLE) ×1 IMPLANT
NEEDLE FILTER BLUNT 18X 1/2SAF (NEEDLE) ×1
NEEDLE FILTER BLUNT 18X1 1/2 (NEEDLE) ×1 IMPLANT
SYR 3ML LL SCALE MARK (SYRINGE) ×1 IMPLANT
WATER STERILE IRR 250ML POUR (IV SOLUTION) ×1 IMPLANT

## 2022-05-04 NOTE — Anesthesia Postprocedure Evaluation (Signed)
Anesthesia Post Note  Patient: [name] [name]  Procedure(s) Performed: CATARACT EXTRACTION PHACO AND INTRAOCULAR LENS PLACEMENT (IOC) RIGHT (Right: Eye)     Patient location during evaluation: PACU Anesthesia Type: MAC Level of consciousness: awake and alert Pain management: pain level controlled Vital Signs Assessment: post-procedure vital signs reviewed and stable Respiratory status: spontaneous breathing, nonlabored ventilation, respiratory function stable and patient connected to nasal cannula oxygen Cardiovascular status: stable and blood pressure returned to baseline Postop Assessment: no apparent nausea or vomiting Anesthetic complications: no   There were no known notable events for this encounter.  [name]

## 2022-05-04 NOTE — H&P (Signed)
[facility]   Primary Care Physician:  Tower, Wynelle Fanny, MD Ophthalmologist: Dr. George Ina  Pre-Procedure History & Physical: HPI:  [name] [name] is a 76 y.o. female here for cataract surgery.   Past Medical History:  Diagnosis Date   Aortic calcification (HCC)    COPD (chronic obstructive pulmonary disease) ([place])    Depression    Hyperlipidemia    Hypertension    Hypothyroidism    Osteoarthritis    Osteoporosis    Shingles    arm 1/12   Sleep disorder    Tobacco abuse    past; quit 09    Past Surgical History:  Procedure Laterality Date   ABDOMINAL HYSTERECTOMY     BIOPSY THYROID     pos neoplasm, surgery 09/2006   bunions  06/1998   CATARACT EXTRACTION W/PHACO Left 04/20/2022   Procedure: CATARACT EXTRACTION PHACO AND INTRAOCULAR LENS PLACEMENT ([place]) LEFT;  Surgeon: Birder Robson, MD;  Location: [place];  Service: Ophthalmology;  Laterality: Left;  10.24 0058.5   COLONOSCOPY WITH PROPOFOL N/A 03/15/2018   Procedure: COLONOSCOPY WITH PROPOFOL;  Surgeon: Virgel Manifold, MD;  Location: [facility];  Service: Endoscopy;  Laterality: N/A;   CORONARY ANGIOPLASTY     CXR-copd, ts partial compression fracture  12/2006   dexa  12/1996   ESOPHAGOGASTRODUODENOSCOPY (EGD) WITH PROPOFOL N/A 03/15/2018   Procedure: ESOPHAGOGASTRODUODENOSCOPY (EGD) WITH PROPOFOL;  Surgeon: Virgel Manifold, MD;  Location: [facility];  Service: Endoscopy;  Laterality: N/A;   GANGLION CYST EXCISION Right 05/16/2019   Procedure: REMOVAL GANGLION OF WRIST;  Surgeon: Corky Mull, MD;  Location: [facility];  Service: Orthopedics;  Laterality: Right;   hashimoto  02/1997   hyerectomy- cervical ca cells     LEFT HEART CATH AND CORONARY ANGIOGRAPHY Left 12/23/2017   Procedure: LEFT HEART CATH AND CORONARY ANGIOGRAPHY;  Surgeon: Minna Merritts, MD;  Location: [place] [facility] [facility];  Service: Cardiovascular;  Laterality: Left;   left wrist fracture     surgery   right thyroid  lobectomy, goiter, thyroiditis  12/2006   stress fractures foot     THYROIDECTOMY  11/2009    Prior to Admission medications   Medication Sig Start Date End Date Taking? Authorizing Provider  albuterol (VENTOLIN HFA) 108 (90 Base) MCG/ACT inhaler Inhale 2 puffs into the lungs every 4 (four) hours as needed for wheezing or shortness of breath.   Yes [provider]  atorvastatin (LIPITOR) 80 MG tablet TAKE 1 TABLET EVERY DAY 01/26/22  Yes Tower, Wynelle Fanny, MD  Calcium Carbonate-Vit D-Min (CALCIUM 1200 PO) Take 1 tablet by mouth daily.    Yes [provider]  cyclobenzaprine (FLEXERIL) 10 MG tablet TAKE 1 TABLET BY MOUTH EVERY DAY AT BEDTIME AS NEEDED 11/10/21  Yes Tower, Wynelle Fanny, MD  ezetimibe (ZETIA) 10 MG tablet TAKE 1 TABLET EVERY DAY 01/06/22  Yes Tower, Wynelle Fanny, MD  famotidine (PEPCID) 20 MG tablet Take 2 tablets (40 mg total) by mouth 2 (two) times daily. 04/19/22  Yes Tower, Wynelle Fanny, MD  levothyroxine (SYNTHROID) 75 MCG tablet Take 1 tablet (75 mcg total) by mouth daily before breakfast. 04/19/22  Yes Tower, Roque Lias A, MD  lisinopril (ZESTRIL) 20 MG tablet TAKE 1 TABLET EVERY DAY 01/26/22  Yes Tower, Wynelle Fanny, MD  LORazepam (ATIVAN) 1 MG tablet TAKE 1 TABLET AT BEDTIME AS NEEDED FOR SLEEP 01/06/22  Yes Tower, Wynelle Fanny, MD  nortriptyline (PAMELOR) 75 MG capsule TAKE 1 CAPSULE AT BEDTIME 10/13/21  Yes Tower, Wynelle Fanny, MD  traZODone (DESYREL) 50 MG tablet Take 0.5-1 tablets (25-50 mg total) by mouth at bedtime as needed for sleep. 09/08/21  Yes Tower, Wynelle Fanny, MD  lidocaine (XYLOCAINE) 2 % solution Use as directed 15 mLs in the mouth or throat as needed for mouth pain. 05/25/21   Lavonia Drafts, MD    Allergies as of 03/02/2022 - Review Complete 02/17/2022  Allergen Reaction Noted   Fosamax [alendronate sodium]  06/28/2014   Raloxifene hcl Other (See Comments) 09/26/2017    Family History  Problem Relation Age of Onset   Stroke Father    Hypertension Father    Diabetes Mother    Coronary  artery disease Mother     Social History   Socioeconomic History   Marital status: Widowed    Spouse name: Not on file   Number of children: Not on file   Years of education: Not on file   Highest education level: Not on file  Occupational History   Not on file  Tobacco Use   Smoking status: Former    Packs/day: 0.50    Types: Cigarettes    Quit date: 08/07/2019    Years since quitting: 2.7   Smokeless tobacco: Never  Vaping Use   Vaping Use: Never used  Substance and Sexual Activity   Alcohol use: No    Alcohol/week: 0.0 standard drinks of alcohol   Drug use: No   Sexual activity: Not on file  Other Topics Concern   Not on file  Social History Narrative   Marital Status: widowed.    3 children   Lost husband to throat cancer in 8/16 .    Social Determinants of Health   Financial Resource Strain: Low Risk  (02/17/2022)   Overall Financial Resource Strain (CARDIA)    Difficulty of Paying Living Expenses: Not hard at all  Food Insecurity: No Food Insecurity (02/17/2022)   Hunger Vital Sign    Worried About Running Out of Food in the Last Year: Never true    Ran Out of Food in the Last Year: Never true  Transportation Needs: No Transportation Needs (02/17/2022)   PRAPARE - Hydrologist (Medical): No    Lack of Transportation (Non-Medical): No  Physical Activity: Sufficiently Active (02/17/2022)   Exercise Vital Sign    Days of Exercise per Week: 5 days    Minutes of Exercise per Session: 30 min  Stress: No Stress Concern Present (02/17/2022)   [place]    Feeling of Stress : Only a little  Social Connections: Moderately Isolated (02/17/2022)   Social Connection and Isolation Panel [NHANES]    Frequency of Communication with Friends and Family: More than three times a week    Frequency of Social Gatherings with Friends and Family: Once a week    Attends Religious Services:  More than 4 times per year    Active Member of [employer] or Organizations: No    Attends Archivist Meetings: Never    Marital Status: Widowed  Intimate Partner Violence: Not At Risk (02/17/2022)   Humiliation, Afraid, Rape, and Kick questionnaire    Fear of Current or Ex-Partner: No    Emotionally Abused: No    Physically Abused: No    Sexually Abused: No    Review of Systems: See HPI, otherwise negative ROS  Physical Exam: BP (!) 196/80   Pulse 71   Temp 99 F (37.2  C) (Temporal)   Ht 5' 2.5" (1.588 m)   Wt 48.5 kg   SpO2 97%   BMI 19.26 kg/m  General:   Alert, cooperative in NAD Head:  Normocephalic and atraumatic. Respiratory:  Normal work of breathing. Cardiovascular:  RRR  Impression/Plan: [name] [name] is here for cataract surgery.  Risks, benefits, limitations, and alternatives regarding cataract surgery have been reviewed with the patient.  Questions have been answered.  All parties agreeable.   Birder Robson, MD  05/04/2022, 9:45 AM

## 2022-05-04 NOTE — Transfer of Care (Signed)
Immediate Anesthesia Transfer of Care Note  Patient: [name] [name]  Procedure(s) Performed: CATARACT EXTRACTION PHACO AND INTRAOCULAR LENS PLACEMENT (IOC) RIGHT (Right: Eye)  Patient Location: PACU  Anesthesia Type: No value filed.  Level of Consciousness: awake, alert  and patient cooperative  Airway and Oxygen Therapy: Patient Spontanous Breathing and Patient connected to supplemental oxygen  Post-op Assessment: Post-op Vital signs reviewed, Patient's Cardiovascular Status Stable, Respiratory Function Stable, Patent Airway and No signs of Nausea or vomiting  Post-op Vital Signs: Reviewed and stable  Complications: There were no known notable events for this encounter.

## 2022-05-04 NOTE — Anesthesia Preprocedure Evaluation (Signed)
Anesthesia Evaluation  Patient identified by MRN, date of birth, ID band Patient awake    Reviewed: Allergy & Precautions, H&P , NPO status , Patient's Chart, lab work & pertinent test results, reviewed documented beta blocker date and time   Airway Mallampati: II  TM Distance: >3 FB Neck ROM: full    Dental no notable dental hx. (+) Teeth Intact   Pulmonary COPD, former smoker,    Pulmonary exam normal breath sounds clear to auscultation       Cardiovascular Exercise Tolerance: Good hypertension, On Medications + CAD   Rhythm:regular Rate:Normal     Neuro/Psych Anxiety Depression negative neurological ROS  negative psych ROS   GI/Hepatic Neg liver ROS, GERD  Medicated,  Endo/Other  diabetesHypothyroidism   Renal/GU      Musculoskeletal   Abdominal   Peds  Hematology negative hematology ROS (+)   Anesthesia Other Findings   Reproductive/Obstetrics negative OB ROS                             Anesthesia Physical Anesthesia Plan  ASA: 3  Anesthesia Plan: MAC   Post-op Pain Management:    Induction:   PONV Risk Score and Plan:   Airway Management Planned:   Additional Equipment:   Intra-op Plan:   Post-operative Plan:   Informed Consent: I have reviewed the patients History and Physical, chart, labs and discussed the procedure including the risks, benefits and alternatives for the proposed anesthesia with the patient or authorized representative who has indicated his/her understanding and acceptance.       Plan Discussed with: CRNA  Anesthesia Plan Comments:         Anesthesia Quick Evaluation

## 2022-05-04 NOTE — Op Note (Signed)
PREOPERATIVE DIAGNOSIS:  Nuclear sclerotic cataract of the right eye.   POSTOPERATIVE DIAGNOSIS:  H25.11 Cataract   OPERATIVE PROCEDURE:ORPROCALL@   SURGEON:  Birder Robson, MD.   ANESTHESIA:  Anesthesiologist: [name], MD CRNA: Ester Rink, CRNA  1.      Managed anesthesia care. 2.      0.81m of Shugarcaine was instilled in the eye following the paracentesis.   COMPLICATIONS:  None.   TECHNIQUE:   Stop and chop   DESCRIPTION OF PROCEDURE:  The patient was examined and consented in the preoperative holding area where the aforementioned topical anesthesia was applied to the right eye and then brought back to the Operating Room where the right eye was prepped and draped in the usual sterile ophthalmic fashion and a lid speculum was placed. A paracentesis was created with the side port blade and the anterior chamber was filled with viscoelastic. A near clear corneal incision was performed with the steel keratome. A continuous curvilinear capsulorrhexis was performed with a cystotome followed by the capsulorrhexis forceps. Hydrodissection and hydrodelineation were carried out with BSS on a blunt cannula. The lens was removed in a stop and chop  technique and the remaining cortical material was removed with the irrigation-aspiration handpiece. The capsular bag was inflated with viscoelastic and the Technis ZCB00  lens was placed in the capsular bag without complication. The remaining viscoelastic was removed from the eye with the irrigation-aspiration handpiece. The wounds were hydrated. The anterior chamber was flushed with BSS and the eye was inflated to physiologic pressure. 0.111mof Vigamox was placed in the anterior chamber. The wounds were found to be water tight. The eye was dressed with Combigan. The patient was given protective glasses to wear throughout the day and a shield with which to sleep tonight. The patient was also given drops with which to begin a drop regimen today and  will follow-up with me in one day. Implant Name Type Inv. Item Serial No. Manufacturer Lot No. LRB No. Used Action  LENS IOL TECNIS EYHANCE 22.0 - [id number] Lens LENS IOL TECNIS EYHANCE 22.0 [id number]  Right 1 Implanted   Procedure(s) with comments: CATARACT EXTRACTION PHACO AND INTRAOCULAR LENS PLACEMENT (IOC) RIGHT (Right) - 6.94 00:43.2  Electronically signed: WiBirder Robson/29/2023 10:10 AM

## 2022-05-05 ENCOUNTER — Encounter: Payer: Self-pay | Admitting: Ophthalmology

## 2022-05-05 DIAGNOSIS — H2511 Age-related nuclear cataract, right eye: Secondary | ICD-10-CM | POA: Diagnosis not present

## 2022-05-17 ENCOUNTER — Telehealth: Payer: Self-pay | Admitting: Family Medicine

## 2022-05-17 NOTE — Telephone Encounter (Signed)
Pt called in requesting a call back # [phone]

## 2022-05-18 ENCOUNTER — Emergency Department: Payer: Medicare HMO

## 2022-05-18 ENCOUNTER — Telehealth: Payer: Self-pay | Admitting: Family Medicine

## 2022-05-18 ENCOUNTER — Emergency Department
Admission: EM | Admit: 2022-05-18 | Discharge: 2022-05-18 | Disposition: A | Discharge status: Home / Self Care | Payer: Medicare HMO | Attending: Emergency Medicine | Admitting: Emergency Medicine

## 2022-05-18 DIAGNOSIS — J449 Chronic obstructive pulmonary disease, unspecified: Secondary | ICD-10-CM | POA: Diagnosis not present

## 2022-05-18 DIAGNOSIS — R42 Dizziness and giddiness: Secondary | ICD-10-CM | POA: Insufficient documentation

## 2022-05-18 DIAGNOSIS — R079 Chest pain, unspecified: Secondary | ICD-10-CM | POA: Diagnosis not present

## 2022-05-18 DIAGNOSIS — R519 Headache, unspecified: Secondary | ICD-10-CM | POA: Diagnosis not present

## 2022-05-18 DIAGNOSIS — G4489 Other headache syndrome: Secondary | ICD-10-CM | POA: Diagnosis not present

## 2022-05-18 DIAGNOSIS — R030 Elevated blood-pressure reading, without diagnosis of hypertension: Secondary | ICD-10-CM | POA: Diagnosis present

## 2022-05-18 DIAGNOSIS — R0789 Other chest pain: Secondary | ICD-10-CM | POA: Diagnosis not present

## 2022-05-18 DIAGNOSIS — I10 Essential (primary) hypertension: Secondary | ICD-10-CM | POA: Insufficient documentation

## 2022-05-18 LAB — CBC WITH DIFFERENTIAL/PLATELET
Abs Immature Granulocytes: 0.01 10*3/uL (ref 0.00–0.07)
Basophils Absolute: 0 10*3/uL (ref 0.0–0.1)
Basophils Relative: 1 %
Eosinophils Absolute: 0.1 10*3/uL (ref 0.0–0.5)
Eosinophils Relative: 1 %
HCT: 39.6 % (ref 36.0–46.0)
Hemoglobin: 13.1 g/dL (ref 12.0–15.0)
Immature Granulocytes: 0 %
Lymphocytes Relative: 15 %
Lymphs Abs: 0.9 10*3/uL (ref 0.7–4.0)
MCH: 31 pg (ref 26.0–34.0)
MCHC: 33.1 g/dL (ref 30.0–36.0)
MCV: 93.8 fL (ref 80.0–100.0)
Monocytes Absolute: 0.6 10*3/uL (ref 0.1–1.0)
Monocytes Relative: 10 %
Neutro Abs: 4.6 10*3/uL (ref 1.7–7.7)
Neutrophils Relative %: 73 %
Platelets: 211 10*3/uL (ref 150–400)
RBC: 4.22 MIL/uL (ref 3.87–5.11)
RDW: 13.7 % (ref 11.5–15.5)
WBC: 6.2 10*3/uL (ref 4.0–10.5)
nRBC: 0 % (ref 0.0–0.2)

## 2022-05-18 LAB — BASIC METABOLIC PANEL
Anion gap: 10 (ref 5–15)
BUN: 14 mg/dL (ref 8–23)
CO2: 24 mmol/L (ref 22–32)
Calcium: 8.9 mg/dL (ref 8.9–10.3)
Chloride: 105 mmol/L (ref 98–111)
Creatinine, Ser: 0.9 mg/dL (ref 0.44–1.00)
GFR, Estimated: >60 mL/min (ref >60)
Glucose, Bld: 105 mg/dL — ABNORMAL HIGH (ref 70–99)
Potassium: 3.9 mmol/L (ref 3.5–5.1)
Sodium: 139 mmol/L (ref 135–145)

## 2022-05-18 LAB — TROPONIN I (HIGH SENSITIVITY): Troponin I (High Sensitivity): 7 ng/L (ref <18)

## 2022-05-18 NOTE — Telephone Encounter (Signed)
[facility] - Client TELEPHONE ADVICE RECORD [facility] Patient Name: [name] [name] Gender: Female DOB: 1946-04-20 Age: 76 Y 21 M 13 D Return Phone Number: [phone] (Primary) Address: [address]  City/ State/ Zip: [place]  [address] Client [facility] - Client Client Site [place] Provider Tower, Roque Lias - MD Contact Type Call Who Is Calling Patient / Member / Family / Caregiver Call Type Triage / Clinical Relationship To Patient Self Return Phone Number [phone] (Primary) Chief Complaint Headache Reason for Call Symptomatic / Request for Health Information Initial Comment Caller states BP high last reading : 180/67 so c/o headaches and not feeling well. Translation No Nurse Assessment Nurse: Rolin Barry, RN, Levada Dy Date/Time (Eastern Time): 05/18/2022 1:33:10 PM Confirm and document reason for call. If symptomatic, describe symptoms. ---Caller states BP high last reading : 180/67 so c/o headaches and not feeling well. Caller advised that she started feeling bad last week. Had a scratchy throat and some sneezing. No temp. Does the patient have any new or worsening symptoms? ---Yes Will a triage be completed? ---Yes Related visit to physician within the last 2 weeks? ---No Does the PT have any chronic conditions? (i.e. diabetes, asthma, this includes High risk factors for pregnancy, etc.) ---Yes List chronic conditions. ---HTN hypothyroid pill sleeping pill Is this a behavioral health or substance abuse call? ---No Guidelines Guideline Title Affirmed Question Affirmed Notes Nurse Date/Time (Eastern Time) Blood Pressure - High Difficult to awaken or acting confused (e.g., disoriented, slurred speech) Deaton, RN, Levada Dy 05/18/2022 1:35:03 PM Disp. Time Eilene Ghazi Time) Disposition Final User 05/18/2022 1:37:07 PM Call EMS 911 Now Yes Deaton, RN,  Levada Dy 05/18/2022 1:42:43 PM 911 Outcome Documentation Deaton, RN, Levada Dy PLEASE NOTE: All timestamps contained within this report are represented as [place] Standard Time. CONFIDENTIALTY NOTICE: This fax transmission is intended only for the addressee. It contains information that is legally privileged, confidential or otherwise protected from use or disclosure. If you are not the intended recipient, you are strictly prohibited from reviewing, disclosing, copying using or disseminating any of this information or taking any action in reliance on or regarding this information. If you have received this fax in error, please notify us immediately by telephone so that we can arrange for its return to [place]. Phone: [phone], Toll-Free: [phone], Fax: [phone] Page: 2 of 2 Call Id: [id number] [place]. Time Eilene Ghazi Time) Disposition Final User Reason: Caller advised EMS is on the way. Final Disposition 05/18/2022 1:37:07 PM Call EMS 911 Now Yes Deaton, RN, Cindee Lame Disagree/Comply Comply Caller Understands Yes PreDisposition Did not know what to do Care Advice Given Per Guideline CALL EMS 911 NOW: * Immediate medical attention is needed. You need to hang up and call 911 (or [name] ambulance). * Triager Discretion: I'll call you back in a few minutes to be sure you were able to reach them. CARE ADVICE given per High Blood Pressure (Adult) guideline

## 2022-05-18 NOTE — Telephone Encounter (Signed)
I spoke with pt; pt is enroute now to [facility] ED. Sending note to Dr Glori Bickers and [facility] CMA.

## 2022-05-18 NOTE — Telephone Encounter (Signed)
Addressed through separate phone note

## 2022-05-18 NOTE — ED Triage Notes (Signed)
Pt BIB EMS with c/o HTN. Pt has hx of HTN and has been taking BP meds. Pt endorses headaches and blurry vision.

## 2022-05-18 NOTE — Telephone Encounter (Signed)
Patient called and stated that her blooded pressure has been running high 193/87, 180/57, 196/86, 186/70 and also stated that she is having headaches and not feeling well. Patient was sent to access nurse.

## 2022-05-18 NOTE — Telephone Encounter (Signed)
Aware, will watch for correspondence Agree with advisement

## 2022-05-18 NOTE — ED Provider Notes (Signed)
[facility] Provider Note    Event Date/Time   First MD Initiated Contact with Patient 05/18/22 1606     (approximate)   History   Chief Complaint Hypertension   HPI  [name] [name] is a 76 y.o. female with past medical history of hypertension, hyperlipidemia, and COPD who presents to the ED complaining of hypertension.  Patient reports that for the past 2 to 3 weeks she has been intermittently noticing that her blood pressure has been running high.  She states that it has gotten as high as the 200s over 90s, only takes lisinopril for blood pressure in the mornings and denies missing any doses.  During this time, she states she has been feeling dizzy and "swimmy headed" with some occasional sharp pain in the left side of her chest.  She denies any fevers, cough, difficulty breathing, vision changes, numbness, or weakness.  She currently states she is feeling fine with no symptoms.     Physical Exam   Triage Vital Signs: ED Triage Vitals [05/18/22 1440]  Enc Vitals Group     BP (!) 149/72     Pulse Rate (!) 107     Resp 20     Temp 98.4 F (36.9 C)     Temp Source Oral     SpO2 93 %     Weight      Height      Head Circumference      Peak Flow      Pain Score      Pain Loc      Pain Edu?      Excl. in [place]?     Most recent vital signs: Vitals:   05/18/22 1440  BP: (!) 149/72  Pulse: (!) 107  Resp: 20  Temp: 98.4 F (36.9 C)  SpO2: 93%    Constitutional: Alert and oriented. Eyes: Conjunctivae are normal.  Pupils equal, round, and reactive to light bilaterally. Head: Atraumatic. Nose: No congestion/rhinnorhea. Mouth/Throat: Mucous membranes are moist.  Cardiovascular: Normal rate, regular rhythm. Grossly normal heart sounds.  2+ radial pulses bilaterally. Respiratory: Normal respiratory effort.  No retractions. Lungs CTAB.  No chest wall tenderness to palpation. Gastrointestinal: Soft and nontender. No distention. Musculoskeletal: No  lower extremity tenderness nor edema.  Neurologic:  Normal speech and language. No gross focal neurologic deficits are appreciated.    ED Results / Procedures / Treatments   Labs (all labs ordered are listed, but only abnormal results are displayed) Labs Reviewed  BASIC METABOLIC PANEL - Abnormal; Notable for the following components:      Result Value   Glucose, Bld 105 (*)    All other components within normal limits  CBC WITH DIFFERENTIAL/PLATELET  TROPONIN I (HIGH SENSITIVITY)     EKG  ED ECG REPORT I, Blake Divine, the attending physician, personally viewed and interpreted this ECG.   Date: 05/18/2022  EKG Time: 14:48  Rate: 106  Rhythm: sinus tachycardia  Axis: Normal  Intervals:none  ST&T Change: None  RADIOLOGY CT head reviewed and interpreted by me with no hemorrhage or midline shift.  PROCEDURES:  Critical Care performed: No  Procedures   MEDICATIONS ORDERED IN ED: Medications - No data to display   IMPRESSION / MDM / [place] / ED COURSE  I reviewed the triage vital signs and the nursing notes.  76 y.o. female with past medical history of hypertension, hyperlipidemia, and COPD who presents to the ED with 2 to 3 weeks of noticing her blood pressure has been running high with dizziness and occasional pain in her chest.  Patient's presentation is most consistent with acute presentation with potential threat to life or bodily function.  Differential diagnosis includes, but is not limited to, ACS, PE, pneumonia, pneumothorax, renal failure, electrolyte abnormality, stroke, TIA, dissection, medication noncompliance.  Patient nontoxic-appearing and in no acute distress, vital signs remarkable for mild tachycardia, however blood pressure is essentially normal.  EKG shows no evidence of arrhythmia or ischemia and chest pain described sounds atypical for ACS.  We will screen troponin, but she currently denies any chest  pain and I doubt PE, dissection, pneumonia, pneumonia or pneumothorax.  Additional labs are reassuring with no significant anemia, leukocytosis, electrolyte abnormality, or AKI.  With her dizziness, we will check CT head, but low suspicion for acute stroke given her nonfocal neurologic exam.  No medication changes indicated at this time given her reassuring blood pressure here in the ED.  CT head is negative for acute process, troponin within normal limits.  Blood pressure remains reassuring and patient asymptomatic here in the ED.  She is appropriate for discharge home with PCP follow-up, was counseled to log her blood pressures twice a day until then.  She was counseled to return to the ED for new or worsening symptoms, patient and family agree with plan.      FINAL CLINICAL IMPRESSION(S) / ED DIAGNOSES   Final diagnoses:  Primary hypertension  Dizziness  Chest pain, unspecified type     Rx / DC Orders   ED Discharge Orders     None        Note:  This document was prepared using Dragon voice recognition software and may include unintentional dictation errors.   Blake Divine, MD 05/18/22 1843

## 2022-05-18 NOTE — Telephone Encounter (Signed)
Not sure why pt is calling, I have not called.   Left VM requesting pt to call the office back   If pt calls back and I'm not available please get info on why pt is calling

## 2022-05-21 ENCOUNTER — Telehealth: Payer: Self-pay

## 2022-05-21 NOTE — Chronic Care Management (AMB) (Signed)
    Chronic Care Management Pharmacy Assistant   Name: [name] [name]  MRN: [id number] DOB: 1946-08-28   Reason for Encounter: Hospital Follow up [facility] visits:  05/04/22- Gwyndolyn Saxon Porfilio,MD([facility])- cataract surgery-OP 04/20/22-William Porfilio,MD([facility])-cataract surgey-OP  Medications: Outpatient Encounter Medications as of 05/21/2022  Medication Sig   albuterol (VENTOLIN HFA) 108 (90 Base) MCG/ACT inhaler Inhale 2 puffs into the lungs every 4 (four) hours as needed for wheezing or shortness of breath.   atorvastatin (LIPITOR) 80 MG tablet TAKE 1 TABLET EVERY DAY   Calcium Carbonate-Vit D-Min (CALCIUM 1200 PO) Take 1 tablet by mouth daily.    cyclobenzaprine (FLEXERIL) 10 MG tablet TAKE 1 TABLET BY MOUTH EVERY DAY AT BEDTIME AS NEEDED   ezetimibe (ZETIA) 10 MG tablet TAKE 1 TABLET EVERY DAY   famotidine (PEPCID) 20 MG tablet Take 2 tablets (40 mg total) by mouth 2 (two) times daily.   levothyroxine (SYNTHROID) 75 MCG tablet Take 1 tablet (75 mcg total) by mouth daily before breakfast.   lidocaine (XYLOCAINE) 2 % solution Use as directed 15 mLs in the mouth or throat as needed for mouth pain.   lisinopril (ZESTRIL) 20 MG tablet TAKE 1 TABLET EVERY DAY   LORazepam (ATIVAN) 1 MG tablet TAKE 1 TABLET AT BEDTIME AS NEEDED FOR SLEEP   nortriptyline (PAMELOR) 75 MG capsule TAKE 1 CAPSULE AT BEDTIME   traZODone (DESYREL) 50 MG tablet Take 0.5-1 tablets (25-50 mg total) by mouth at bedtime as needed for sleep.   No facility-administered encounter medications on file as of 05/21/2022.     Reviewed hospital notes for details of recent visit. Patient has been contacted by Transitions of Care team: No  Admitted to the ED on 05/18/22. Discharge date was 05/18/22.  Discharged from [facility]  ED.   Discharge diagnosis (Principal Problem): HTN Patient was discharged to Home  Brief summary of hospital course: EKG shows no evidence of arrhythmia or ischemia  and chest pain described sounds atypical for ACS. Additional labs are reassuring with no significant anemia, leukocytosis, electrolyte abnormality, or AKI.  With her dizziness, we will check CT head-CT head is negative for acute process, troponin within normal limits-discharge home with PCP follow-up,  Medications that remain the same after Hospital Discharge:??  -All other medications will remain the same.    Next CCM appt: none  Other upcoming appts: PCP appointment on 05/31/22 and Cardiology appointment on 07/28/22  [name], PharmD notified and will determine if action is needed.  Avel Sensor, [place]  [phone]

## 2022-05-25 ENCOUNTER — Telehealth: Payer: Self-pay

## 2022-05-25 ENCOUNTER — Ambulatory Visit (INDEPENDENT_AMBULATORY_CARE_PROVIDER_SITE_OTHER): Payer: Medicare HMO | Admitting: *Deleted

## 2022-05-25 DIAGNOSIS — E538 Deficiency of other specified B group vitamins: Secondary | ICD-10-CM | POA: Diagnosis not present

## 2022-05-25 MED ORDER — CYANOCOBALAMIN 1000 MCG/ML IJ SOLN
1000.0000 ug | Freq: Once | INTRAMUSCULAR | Status: AC
  Administered 2022-05-25: 1000 ug via INTRAMUSCULAR

## 2022-05-25 NOTE — Telephone Encounter (Signed)
     Patient  visit on 9/12  at [facility] you been able to follow up with your primary care physician?yes  The patient was or was not able to obtain any needed medicine or equipment.yes  Are there diet recommendations that you are having difficulty following?na  Patient expresses understanding of discharge instructions and education provided has no other needs at this time.  yes    [place], Care Management  [phone] [address], [place], [place] [address] Phone: [phone] Email: [email or web address]

## 2022-05-25 NOTE — Progress Notes (Signed)
Per orders of Dr. Silvio Pate, injection of B12 given by Tammi Sou. Patient tolerated injection well.    PCP out of the office today

## 2022-05-31 ENCOUNTER — Ambulatory Visit (INDEPENDENT_AMBULATORY_CARE_PROVIDER_SITE_OTHER): Payer: Medicare HMO | Admitting: Family Medicine

## 2022-05-31 ENCOUNTER — Encounter: Payer: Self-pay | Admitting: Family Medicine

## 2022-05-31 VITALS — BP 144/71 | HR 82 | Temp 97.6°F | Ht 64.5 in | Wt 108.2 lb

## 2022-05-31 DIAGNOSIS — F419 Anxiety disorder, unspecified: Secondary | ICD-10-CM

## 2022-05-31 DIAGNOSIS — R519 Headache, unspecified: Secondary | ICD-10-CM | POA: Diagnosis not present

## 2022-05-31 DIAGNOSIS — Z0100 Encounter for examination of eyes and vision without abnormal findings: Secondary | ICD-10-CM | POA: Diagnosis not present

## 2022-05-31 DIAGNOSIS — E7800 Pure hypercholesterolemia, unspecified: Secondary | ICD-10-CM

## 2022-05-31 DIAGNOSIS — I10 Essential (primary) hypertension: Secondary | ICD-10-CM

## 2022-05-31 MED ORDER — AMLODIPINE BESYLATE 2.5 MG PO TABS: 2.5000 mg | ORAL_TABLET | Freq: Every day | ORAL | 1 refills | Status: DC

## 2022-05-31 NOTE — Assessment & Plan Note (Signed)
bp has been more elevated lately  Some headaches  Reviewed ER records, labs and studies in detail Reassuring exam today  Suspect her home cuff is not accurate (recommend arm cuff if she can afford) Plan to continue lisinopril 20 mg daily  Add amlodipine 2.5 mg daily- f/u 1 mo  inst to call if any side effects or problems  If not tolerated-low dose diuretic is also an opt Enc good health habits

## 2022-05-31 NOTE — Assessment & Plan Note (Signed)
Disc goals for lipids and reasons to control them Rev last labs with pt Rev low sat fat diet in detail Max dose statin   Last LDL 89

## 2022-05-31 NOTE — Assessment & Plan Note (Signed)
Pt notes no more stress or anxiety than usual

## 2022-05-31 NOTE — Assessment & Plan Note (Signed)
Suspect due to elevated bp  Reassuring ER visit with no acute changes on CT Reassuring exam today  Adding amlodipine today for HTN  inst to call if headache worsens or if it does not improve with improved bp

## 2022-05-31 NOTE — Patient Instructions (Addendum)
If you want to monitor your blood pressure  A cuff for the arm is more accurate  OMRON - cuff size regular   Continue the lisinopril Add amlodipine 2.5 mg once daily   If you check bp at home-keep a log  Make sure you check it when you are relaxed and with arm at heart level   Keep eating healthy low sodium diet   Follow up in 1 month

## 2022-05-31 NOTE — Progress Notes (Signed)
Subjective:    Patient ID: [name] [name], female    DOB: [birth year], 76 y.o.   MRN: [id number]  HPI Pt presents for HTN follow up   Wt Readings from Last 3 Encounters:  05/31/22 108 lb 4 oz (49.1 kg)  05/04/22 107 lb (48.5 kg)  04/20/22 105 lb 11.2 oz (47.9 kg)   18.29 kg/m  She was seen on 9/12 in ER for elevated bp Noted bp was running high for 2-3 weeks- as high as 200s over 90s at home  Had felt dizzy and some sharp L sided chest pain  In ER-no symptoms   EKG was re assuring CT head -neg for acute process  Non focal neuro exam  Neg troponin   BP Readings from Last 3 Encounters:  05/31/22 (!) 148/88  05/18/22 (!) 149/72  05/04/22 (!) 165/83    Lisinopril 20 mg daily  (no side effects)  Uses wrist cuff at home- ? If accurate   Having some headaches Across forehead  More in the evening   No more stress than usual Not anxious   Chest pain is gone today   Last GFR over 60   Results for orders placed or performed during the hospital encounter of 05/18/22  CBC with Differential  Result Value Ref Range   WBC 6.2 4.0 - 10.5 K/uL   RBC 4.22 3.87 - 5.11 MIL/uL   Hemoglobin 13.1 12.0 - 15.0 g/dL   HCT 39.6 36.0 - 46.0 %   MCV 93.8 80.0 - 100.0 fL   MCH 31.0 26.0 - 34.0 pg   MCHC 33.1 30.0 - 36.0 g/dL   RDW 13.7 11.5 - 15.5 %   Platelets 211 150 - 400 K/uL   nRBC 0.0 0.0 - 0.2 %   Neutrophils Relative % 73 %   Neutro Abs 4.6 1.7 - 7.7 K/uL   Lymphocytes Relative 15 %   Lymphs Abs 0.9 0.7 - 4.0 K/uL   Monocytes Relative 10 %   Monocytes Absolute 0.6 0.1 - 1.0 K/uL   Eosinophils Relative 1 %   Eosinophils Absolute 0.1 0.0 - 0.5 K/uL   Basophils Relative 1 %   Basophils Absolute 0.0 0.0 - 0.1 K/uL   Immature Granulocytes 0 %   Abs Immature Granulocytes 0.01 0.00 - 0.07 K/uL  Basic metabolic panel  Result Value Ref Range   Sodium 139 135 - 145 mmol/L   Potassium 3.9 3.5 - 5.1 mmol/L   Chloride 105 98 - 111 mmol/L   CO2 24 22 - 32 mmol/L   Glucose, Bld  105 (H) 70 - 99 mg/dL   BUN 14 8 - 23 mg/dL   Creatinine, Ser 0.90 0.44 - 1.00 mg/dL   Calcium 8.9 8.9 - 10.3 mg/dL   GFR, Estimated >60 >60 mL/min   Anion gap 10 5 - 15  Troponin I (High Sensitivity)  Result Value Ref Range   Troponin I (High Sensitivity) 7 <18 ng/L    CT Head Wo Contrast  Result Date: 05/18/2022 CLINICAL DATA:  Headaches, blurry vision EXAM: CT HEAD WITHOUT CONTRAST TECHNIQUE: Contiguous axial images were obtained from the base of the skull through the vertex without intravenous contrast. RADIATION DOSE REDUCTION: This exam was performed according to the departmental dose-optimization program which includes automated exposure control, adjustment of the mA and/or kV according to patient size and/or use of iterative reconstruction technique. COMPARISON:  10/18/2016 FINDINGS: Brain: No acute intracranial findings are seen. There are no signs of bleeding within the cranium.  Ventricles are not dilated. There is no focal edema or mass effect. Vascular: Unremarkable. Skull: Few small low-density foci in the calvarium have not changed significantly. Sinuses/Orbits: Unremarkable. Other: None. IMPRESSION: No acute intracranial findings are seen in noncontrast CT brain. Atrophy. Electronically Signed   By: Elmer Picker M.D.   On: 05/18/2022 17:02     Last lipid Lab Results  Component Value Date   CHOL 168 04/12/2022   HDL 68.90 04/12/2022   LDLCALC 89 04/12/2022   LDLDIRECT 140.9 07/04/2013   TRIG 52.0 04/12/2022   CHOLHDL 2 04/12/2022   The 10-year ASCVD risk score (Arnett DK, et al., 2019) is: 28.6%   Values used to calculate the score:     Age: 6 years     Sex: Female     Is Non-Hispanic African American: No     Diabetic: No     Tobacco smoker: No     Systolic Blood Pressure: 195 mmHg     Is BP treated: Yes     HDL Cholesterol: 68.9 mg/dL     Total Cholesterol: 168 mg/dL No longer smokes   Patient Active Problem List   Diagnosis Date Noted   Nausea  04/19/2022   Decreased GFR 02/02/2022   GERD (gastroesophageal reflux disease) 02/02/2022   Microscopic hematuria 02/02/2022   Urinary frequency 04/14/2021   Low serum vitamin B12 08/07/2020   Prediabetes 03/07/2020   Tingling in extremities 03/07/2020   Neck pain 11/05/2019   Ganglion cyst of tendon sheath of right hand 05/17/2019   Medicare annual wellness visit, subsequent 10/16/2018   Benign neoplasm of ascending colon    Benign neoplasm of transverse colon    Melanosis, colon    Other specified diseases of esophagus    Stomach irritation    Constipation 01/24/2018   Compression fracture of L1 vertebra ([place]) 01/24/2018   Anxiety 01/09/2018   Coronary artery calcification seen on CAT scan 11/25/2017   Special screening for malignant neoplasms, colon 11/01/2017   Estrogen deficiency 09/26/2017   Aortic calcification ([place]) 11/30/2016   History of patellar fracture 11/30/2016   Routine general medical examination at a health care facility 06/27/2016   Loss of weight 06/04/2016   History of vertebral compression fracture 01/19/2016   Esophageal dysphagia 06/28/2014   Chronic foot pain 10/27/2011   Low back pain 04/14/2011   FEVER BLISTER 11/17/2010   Essential hypertension 08/18/2009   BUNION 03/04/2009   Allergic rhinitis 01/10/2008   H/O goiter 02/09/2007   Hypothyroidism 02/09/2007   HYPERCHOLESTEROLEMIA 02/09/2007   History of depression 02/09/2007   OSTEOARTHRITIS 02/09/2007   Osteoporosis 02/09/2007   Disturbance in sleep behavior 02/09/2007   Past Medical History:  Diagnosis Date   Aortic calcification (HCC)    COPD (chronic obstructive pulmonary disease) ([place])    Depression    Hyperlipidemia    Hypertension    Hypothyroidism    Osteoarthritis    Osteoporosis    Shingles    arm 1/12   Sleep disorder    Tobacco abuse    past; quit 09   Past Surgical History:  Procedure Laterality Date   ABDOMINAL HYSTERECTOMY     BIOPSY THYROID     pos neoplasm,  surgery 09/2006   bunions  06/1998   CATARACT EXTRACTION W/PHACO Left 04/20/2022   Procedure: CATARACT EXTRACTION PHACO AND INTRAOCULAR LENS PLACEMENT ([place]) LEFT;  Surgeon: Birder Robson, MD;  Location: [facility];  Service: Ophthalmology;  Laterality: Left;  10.24 0058.5   CATARACT EXTRACTION W/PHACO  Right 05/04/2022   Procedure: CATARACT EXTRACTION PHACO AND INTRAOCULAR LENS PLACEMENT ([facility]) RIGHT;  Surgeon: Birder Robson, MD;  Location: [facility];  Service: Ophthalmology;  Laterality: Right;  6.94 00:43.2   COLONOSCOPY WITH PROPOFOL N/A 03/15/2018   Procedure: COLONOSCOPY WITH PROPOFOL;  Surgeon: Virgel Manifold, MD;  Location: [facility];  Service: Endoscopy;  Laterality: N/A;   CORONARY ANGIOPLASTY     CXR-copd, ts partial compression fracture  12/2006   dexa  12/1996   ESOPHAGOGASTRODUODENOSCOPY (EGD) WITH PROPOFOL N/A 03/15/2018   Procedure: ESOPHAGOGASTRODUODENOSCOPY (EGD) WITH PROPOFOL;  Surgeon: Virgel Manifold, MD;  Location: [facility];  Service: Endoscopy;  Laterality: N/A;   GANGLION CYST EXCISION Right 05/16/2019   Procedure: REMOVAL GANGLION OF WRIST;  Surgeon: Corky Mull, MD;  Location: [facility];  Service: Orthopedics;  Laterality: Right;   hashimoto  02/1997   hyerectomy- cervical ca cells     LEFT HEART CATH AND CORONARY ANGIOGRAPHY Left 12/23/2017   Procedure: LEFT HEART CATH AND CORONARY ANGIOGRAPHY;  Surgeon: Minna Merritts, MD;  Location: [facility];  Service: Cardiovascular;  Laterality: Left;   left wrist fracture     surgery   right thyroid lobectomy, goiter, thyroiditis  12/2006   stress fractures foot     THYROIDECTOMY  11/2009   Social History   Tobacco Use   Smoking status: Former    Packs/day: 0.50    Types: Cigarettes    Quit date: 08/07/2019    Years since quitting: 2.8   Smokeless tobacco: Never  Vaping Use   Vaping Use: Never used  Substance Use Topics   Alcohol use: No    Alcohol/week: 0.0  standard drinks of alcohol   Drug use: No   Family History  Problem Relation Age of Onset   Stroke Father    Hypertension Father    Diabetes Mother    Coronary artery disease Mother    Allergies  Allergen Reactions   Fosamax [Alendronate Sodium]     Dysphagia and heartburn    Raloxifene Hcl Other (See Comments)    Leg pain and cramps  Leg pain and cramps    Current Outpatient Medications on File Prior to Visit  Medication Sig Dispense Refill   albuterol (VENTOLIN HFA) 108 (90 Base) MCG/ACT inhaler Inhale 2 puffs into the lungs every 4 (four) hours as needed for wheezing or shortness of breath.     atorvastatin (LIPITOR) 80 MG tablet TAKE 1 TABLET EVERY DAY 90 tablet 1   Calcium Carbonate-Vit D-Min (CALCIUM 1200 PO) Take 1 tablet by mouth daily.      cyclobenzaprine (FLEXERIL) 10 MG tablet TAKE 1 TABLET BY MOUTH EVERY DAY AT BEDTIME AS NEEDED 90 tablet 0   ezetimibe (ZETIA) 10 MG tablet TAKE 1 TABLET EVERY DAY 90 tablet 1   famotidine (PEPCID) 20 MG tablet Take 2 tablets (40 mg total) by mouth 2 (two) times daily. 120 tablet 0   levothyroxine (SYNTHROID) 75 MCG tablet Take 1 tablet (75 mcg total) by mouth daily before breakfast. 90 tablet 3   lidocaine (XYLOCAINE) 2 % solution Use as directed 15 mLs in the mouth or throat as needed for mouth pain. 100 mL 0   lisinopril (ZESTRIL) 20 MG tablet TAKE 1 TABLET EVERY DAY 90 tablet 1   LORazepam (ATIVAN) 1 MG tablet TAKE 1 TABLET AT BEDTIME AS NEEDED FOR SLEEP 90 tablet 0   nortriptyline (PAMELOR) 75 MG capsule TAKE 1 CAPSULE AT BEDTIME 90 capsule 1  traZODone (DESYREL) 50 MG tablet Take 0.5-1 tablets (25-50 mg total) by mouth at bedtime as needed for sleep. 90 tablet 0   No current facility-administered medications on file prior to visit.     Review of Systems  Constitutional:  Negative for activity change, appetite change, fatigue, fever and unexpected weight change.  HENT:  Negative for congestion, ear pain, rhinorrhea, sinus  pressure and sore throat.   Eyes:  Negative for pain, redness and visual disturbance.  Respiratory:  Negative for cough, shortness of breath and wheezing.   Cardiovascular:  Negative for chest pain, palpitations and leg swelling.       No chest pain today  Gastrointestinal:  Negative for abdominal pain, blood in stool, constipation and diarrhea.  Endocrine: Negative for polydipsia and polyuria.  Genitourinary:  Negative for dysuria, frequency and urgency.  Musculoskeletal:  Negative for arthralgias, back pain and myalgias.  Skin:  Negative for pallor and rash.  Allergic/Immunologic: Negative for environmental allergies.  Neurological:  Positive for headaches. Negative for dizziness and syncope.  Hematological:  Negative for adenopathy. Does not bruise/bleed easily.  Psychiatric/Behavioral:  Negative for decreased concentration and dysphoric mood. The patient is not nervous/anxious.        Objective:   Physical Exam Constitutional:      General: She is not in acute distress.    Appearance: Normal appearance. She is well-developed. She is not ill-appearing or diaphoretic.     Comments: Underweight  baseline  HENT:     Head: Normocephalic and atraumatic.     Mouth/Throat:     Mouth: Mucous membranes are moist.  Eyes:     General: No scleral icterus.    Conjunctiva/sclera: Conjunctivae normal.     Pupils: Pupils are equal, round, and reactive to light.  Neck:     Thyroid: No thyromegaly.     Vascular: No carotid bruit or JVD.  Cardiovascular:     Rate and Rhythm: Normal rate and regular rhythm.     Heart sounds: Normal heart sounds.     No gallop.  Pulmonary:     Effort: Pulmonary effort is normal. No respiratory distress.     Breath sounds: Normal breath sounds. No wheezing or rales.  Abdominal:     General: There is no distension or abdominal bruit.     Palpations: Abdomen is soft. There is no hepatomegaly, splenomegaly or pulsatile mass.     Comments: No renal bruit   Musculoskeletal:     Cervical back: Normal range of motion and neck supple.     Right lower leg: No edema.     Left lower leg: No edema.  Lymphadenopathy:     Cervical: No cervical adenopathy.  Skin:    General: Skin is warm and dry.     Coloration: Skin is not pale.     Findings: No rash.  Neurological:     Mental Status: She is alert.     Coordination: Coordination normal.     Deep Tendon Reflexes: Reflexes are normal and symmetric. Reflexes normal.  Psychiatric:        Mood and Affect: Mood normal.           Assessment & Plan:   Problem List Items Addressed This Visit       Cardiovascular and Mediastinum   Essential hypertension - Primary    bp has been more elevated lately  Some headaches  Reviewed ER records, labs and studies in detail Reassuring exam today  Suspect her home cuff is  not accurate (recommend arm cuff if she can afford) Plan to continue lisinopril 20 mg daily  Add amlodipine 2.5 mg daily- f/u 1 mo  inst to call if any side effects or problems  If not tolerated-low dose diuretic is also an opt Enc good health habits      Relevant Medications   amLODipine (NORVASC) 2.5 MG tablet     Other   Anxiety    Pt notes no more stress or anxiety than usual      Frontal headache    Suspect due to elevated bp  Reassuring ER visit with no acute changes on CT Reassuring exam today  Adding amlodipine today for HTN  inst to call if headache worsens or if it does not improve with improved bp      Relevant Medications   amLODipine (NORVASC) 2.5 MG tablet   HYPERCHOLESTEROLEMIA    Disc goals for lipids and reasons to control them Rev last labs with pt Rev low sat fat diet in detail Max dose statin   Last LDL 89      Relevant Medications   amLODipine (NORVASC) 2.5 MG tablet

## 2022-06-22 ENCOUNTER — Other Ambulatory Visit: Payer: Self-pay | Admitting: Family Medicine

## 2022-06-25 ENCOUNTER — Other Ambulatory Visit: Payer: Self-pay | Admitting: Family Medicine

## 2022-06-25 NOTE — Telephone Encounter (Signed)
Name of Medication: Ativan Name of Pharmacy: [facility]) Mail order Last Fill or Written Date and Quantity: 01/06/22 #90 tabs 0 refills Last Office Visit and Type:05/31/22 BP Next Office Visit and Type:07/05/22 f/u   Trazodone last filled on 09/08/21 #90 tab 0 refills  Flexeril filled on 11/10/21 #90 tabs/ 0 refills

## 2022-06-29 ENCOUNTER — Ambulatory Visit (INDEPENDENT_AMBULATORY_CARE_PROVIDER_SITE_OTHER): Payer: Medicare HMO

## 2022-06-29 DIAGNOSIS — E538 Deficiency of other specified B group vitamins: Secondary | ICD-10-CM | POA: Diagnosis not present

## 2022-06-29 MED ORDER — CYANOCOBALAMIN 1000 MCG/ML IJ SOLN
1000.0000 ug | Freq: Once | INTRAMUSCULAR | Status: AC
  Administered 2022-06-29: 1000 ug via INTRAMUSCULAR

## 2022-06-29 NOTE — Progress Notes (Signed)
Per orders of Dr. Glori Bickers, monthly injection of b12 given by Loreen Freud. Patient tolerated injection well.

## 2022-07-05 ENCOUNTER — Ambulatory Visit (INDEPENDENT_AMBULATORY_CARE_PROVIDER_SITE_OTHER): Payer: Medicare HMO | Admitting: Family Medicine

## 2022-07-05 ENCOUNTER — Encounter: Payer: Self-pay | Admitting: Family Medicine

## 2022-07-05 VITALS — BP 148/70 | HR 90 | Temp 97.7°F | Ht 62.5 in | Wt 105.2 lb

## 2022-07-05 DIAGNOSIS — M8000XS Age-related osteoporosis with current pathological fracture, unspecified site, sequela: Secondary | ICD-10-CM

## 2022-07-05 DIAGNOSIS — I10 Essential (primary) hypertension: Secondary | ICD-10-CM | POA: Diagnosis not present

## 2022-07-05 DIAGNOSIS — Z23 Encounter for immunization: Secondary | ICD-10-CM

## 2022-07-05 LAB — BASIC METABOLIC PANEL
BUN: 18 mg/dL (ref 6–23)
CO2: 29 mEq/L (ref 19–32)
Calcium: 8.8 mg/dL (ref 8.4–10.5)
Chloride: 105 mEq/L (ref 96–112)
Creatinine, Ser: 0.71 mg/dL (ref 0.40–1.20)
GFR: 82.36 mL/min (ref >60.00)
Glucose, Bld: 84 mg/dL (ref 70–99)
Potassium: 3.6 mEq/L (ref 3.5–5.1)
Sodium: 141 mEq/L (ref 135–145)

## 2022-07-05 MED ORDER — LOSARTAN POTASSIUM 50 MG PO TABS: 50.0000 mg | ORAL_TABLET | Freq: Every day | ORAL | 3 refills | Status: DC

## 2022-07-05 MED ORDER — CHLORTHALIDONE 15 MG PO TABS: 15.0000 mg | ORAL_TABLET | Freq: Every day | ORAL | 0 refills | Status: DC

## 2022-07-05 NOTE — Assessment & Plan Note (Signed)
Doing well with Prolia  Bmp today in prep for next shot

## 2022-07-05 NOTE — Progress Notes (Signed)
Subjective:    Patient ID: [name] [name], female    DOB: 08-17-1946, 76 y.o.   MRN: [id number]  HPI Pt presents for f/u of HTN  Also due for lab for Prolia  Wt Readings from Last 3 Encounters:  07/05/22 105 lb 4 oz (47.7 kg)  05/31/22 108 lb 4 oz (49.1 kg)  05/04/22 107 lb (48.5 kg)   18.94 kg/m   HTN Last visit she had been to ER for HA and elevated bp  bp is stable today  No cp or palpitations or headaches or edema  No side effects to medicines  BP Readings from Last 3 Encounters:  07/05/22 (!) 148/70  05/31/22 (!) 144/71  05/18/22 (!) 149/72    Lisinopril 20 mg daily  Amlodipine 2.5 mg daily added last visit   Feels pretty good  A little swimmy headed  Any time - not with position change  At home bp bp is high 160s   Still has headaches    Lab Results  Component Value Date   CREATININE 0.90 05/18/2022   BUN 14 05/18/2022   NA 139 05/18/2022   K 3.9 05/18/2022   CL 105 05/18/2022   CO2 24 05/18/2022   GFR over 60 Lab Results  Component Value Date   CHOL 168 04/12/2022   HDL 68.90 04/12/2022   LDLCALC 89 04/12/2022   LDLDIRECT 140.9 07/04/2013   TRIG 52.0 04/12/2022   CHOLHDL 2 04/12/2022    Needs bmp for prolia today Also needs flu shot   Patient Active Problem List   Diagnosis Date Noted   Frontal headache 05/31/2022   Nausea 04/19/2022   GERD (gastroesophageal reflux disease) 02/02/2022   Microscopic hematuria 02/02/2022   Urinary frequency 04/14/2021   Low serum vitamin B12 08/07/2020   Prediabetes 03/07/2020   Tingling in extremities 03/07/2020   Neck pain 11/05/2019   Ganglion cyst of tendon sheath of right hand 05/17/2019   Medicare annual wellness visit, subsequent 10/16/2018   Benign neoplasm of ascending colon    Benign neoplasm of transverse colon    Melanosis, colon    Other specified diseases of esophagus    Stomach irritation    Constipation 01/24/2018   Compression fracture of L1 vertebra ([place]) 01/24/2018   Anxiety  01/09/2018   Coronary artery calcification seen on CAT scan 11/25/2017   Special screening for malignant neoplasms, colon 11/01/2017   Estrogen deficiency 09/26/2017   Aortic calcification ([place]) 11/30/2016   History of patellar fracture 11/30/2016   Routine general medical examination at a health care facility 06/27/2016   Loss of weight 06/04/2016   History of vertebral compression fracture 01/19/2016   Esophageal dysphagia 06/28/2014   Chronic foot pain 10/27/2011   Low back pain 04/14/2011   FEVER BLISTER 11/17/2010   Essential hypertension 08/18/2009   BUNION 03/04/2009   Allergic rhinitis 01/10/2008   H/O goiter 02/09/2007   Hypothyroidism 02/09/2007   HYPERCHOLESTEROLEMIA 02/09/2007   History of depression 02/09/2007   OSTEOARTHRITIS 02/09/2007   Osteoporosis 02/09/2007   Disturbance in sleep behavior 02/09/2007   Past Medical History:  Diagnosis Date   Aortic calcification (HCC)    COPD (chronic obstructive pulmonary disease) ([place])    Depression    Hyperlipidemia    Hypertension    Hypothyroidism    Osteoarthritis    Osteoporosis    Shingles    arm 1/12   Sleep disorder    Tobacco abuse    past; quit 09   Past Surgical History:  Procedure Laterality Date   ABDOMINAL HYSTERECTOMY     BIOPSY THYROID     pos neoplasm, surgery 09/2006   bunions  06/1998   CATARACT EXTRACTION W/PHACO Left 04/20/2022   Procedure: CATARACT EXTRACTION PHACO AND INTRAOCULAR LENS PLACEMENT ([place]) LEFT;  Surgeon: Birder Robson, MD;  Location: [place];  Service: Ophthalmology;  Laterality: Left;  10.24 0058.5   CATARACT EXTRACTION W/PHACO Right 05/04/2022   Procedure: CATARACT EXTRACTION PHACO AND INTRAOCULAR LENS PLACEMENT ([place]) RIGHT;  Surgeon: Birder Robson, MD;  Location: [place];  Service: Ophthalmology;  Laterality: Right;  6.94 00:43.2   COLONOSCOPY WITH PROPOFOL N/A 03/15/2018   Procedure: COLONOSCOPY WITH PROPOFOL;  Surgeon: Virgel Manifold, MD;   Location: [facility];  Service: Endoscopy;  Laterality: N/A;   CORONARY ANGIOPLASTY     CXR-copd, ts partial compression fracture  12/2006   dexa  12/1996   ESOPHAGOGASTRODUODENOSCOPY (EGD) WITH PROPOFOL N/A 03/15/2018   Procedure: ESOPHAGOGASTRODUODENOSCOPY (EGD) WITH PROPOFOL;  Surgeon: Virgel Manifold, MD;  Location: [facility];  Service: Endoscopy;  Laterality: N/A;   GANGLION CYST EXCISION Right 05/16/2019   Procedure: REMOVAL GANGLION OF WRIST;  Surgeon: Corky Mull, MD;  Location: [facility];  Service: Orthopedics;  Laterality: Right;   hashimoto  02/1997   hyerectomy- cervical ca cells     LEFT HEART CATH AND CORONARY ANGIOGRAPHY Left 12/23/2017   Procedure: LEFT HEART CATH AND CORONARY ANGIOGRAPHY;  Surgeon: Minna Merritts, MD;  Location: [place] [facility];  Service: Cardiovascular;  Laterality: Left;   left wrist fracture     surgery   right thyroid lobectomy, goiter, thyroiditis  12/2006   stress fractures foot     THYROIDECTOMY  11/2009   Social History   Tobacco Use   Smoking status: Former    Packs/day: 0.50    Types: Cigarettes    Quit date: 08/07/2019    Years since quitting: 2.9   Smokeless tobacco: Never  Vaping Use   Vaping Use: Never used  Substance Use Topics   Alcohol use: No    Alcohol/week: 0.0 standard drinks of alcohol   Drug use: No   Family History  Problem Relation Age of Onset   Stroke Father    Hypertension Father    Diabetes Mother    Coronary artery disease Mother    Allergies  Allergen Reactions   Fosamax [Alendronate Sodium]     Dysphagia and heartburn    Raloxifene Hcl Other (See Comments)    Leg pain and cramps  Leg pain and cramps    Current Outpatient Medications on File Prior to Visit  Medication Sig Dispense Refill   albuterol (VENTOLIN HFA) 108 (90 Base) MCG/ACT inhaler Inhale 2 puffs into the lungs every 4 (four) hours as needed for wheezing or shortness of breath.     atorvastatin (LIPITOR) 80 MG tablet TAKE 1  TABLET EVERY DAY 90 tablet 1   Calcium Carbonate-Vit D-Min (CALCIUM 1200 PO) Take 1 tablet by mouth daily.      cyclobenzaprine (FLEXERIL) 10 MG tablet TAKE 1 TABLET AT BEDTIME AS NEEDED 90 tablet 1   ezetimibe (ZETIA) 10 MG tablet TAKE 1 TABLET EVERY DAY 90 tablet 1   famotidine (PEPCID) 20 MG tablet TAKE 2 TABLETS TWICE DAILY 120 tablet 5   levothyroxine (SYNTHROID) 75 MCG tablet Take 1 tablet (75 mcg total) by mouth daily before breakfast. 90 tablet 3   lidocaine (XYLOCAINE) 2 % solution Use as directed 15 mLs in the mouth or  throat as needed for mouth pain. 100 mL 0   LORazepam (ATIVAN) 1 MG tablet TAKE 1 TABLET AT BEDTIME AS NEEDED FOR SLEEP 30 tablet 1   nortriptyline (PAMELOR) 75 MG capsule TAKE 1 CAPSULE AT BEDTIME 90 capsule 1   traZODone (DESYREL) 50 MG tablet TAKE 1/2 TO 1 TABLET (25-50 MG TOTAL) BY MOUTH AT BEDTIME AS NEEDED FOR SLEEP. 90 tablet 1   No current facility-administered medications on file prior to visit.    Review of Systems  Constitutional:  Negative for activity change, appetite change, fatigue, fever and unexpected weight change.  HENT:  Negative for congestion, ear pain, rhinorrhea, sinus pressure and sore throat.   Eyes:  Negative for pain, redness and visual disturbance.  Respiratory:  Negative for cough, shortness of breath and wheezing.   Cardiovascular:  Negative for chest pain and palpitations.  Gastrointestinal:  Negative for abdominal pain, blood in stool, constipation and diarrhea.  Endocrine: Negative for polydipsia and polyuria.  Genitourinary:  Negative for dysuria, frequency and urgency.  Musculoskeletal:  Negative for arthralgias, back pain and myalgias.  Skin:  Negative for pallor and rash.  Allergic/Immunologic: Negative for environmental allergies.  Neurological:  Positive for light-headedness and headaches. Negative for syncope.  Hematological:  Negative for adenopathy. Does not bruise/bleed easily.  Psychiatric/Behavioral:  Negative for  decreased concentration and dysphoric mood. The patient is not nervous/anxious.        Objective:   Physical Exam Constitutional:      General: She is not in acute distress.    Appearance: Normal appearance. She is well-developed and normal weight. She is not ill-appearing or diaphoretic.  HENT:     Head: Normocephalic and atraumatic.     Comments: No facial tenderness or swelling    Mouth/Throat:     Pharynx: No oropharyngeal exudate.  Eyes:     General: No scleral icterus.       Right eye: No discharge.        Left eye: No discharge.     Conjunctiva/sclera: Conjunctivae normal.     Pupils: Pupils are equal, round, and reactive to light.     Comments: No nystagmus  Neck:     Thyroid: No thyromegaly.     Vascular: No carotid bruit or JVD.     Trachea: No tracheal deviation.  Cardiovascular:     Rate and Rhythm: Normal rate and regular rhythm.     Heart sounds: Normal heart sounds. No murmur heard. Pulmonary:     Effort: Pulmonary effort is normal. No respiratory distress.     Breath sounds: Normal breath sounds. No wheezing or rales.  Abdominal:     General: Bowel sounds are normal. There is no distension.     Palpations: Abdomen is soft. There is no mass.     Tenderness: There is no abdominal tenderness.  Musculoskeletal:        General: No tenderness.     Cervical back: Full passive range of motion without pain, normal range of motion and neck supple.     Right lower leg: No edema.     Left lower leg: No edema.  Lymphadenopathy:     Cervical: No cervical adenopathy.  Skin:    General: Skin is warm and dry.     Coloration: Skin is not pale.     Findings: No rash.  Neurological:     Mental Status: She is alert and oriented to person, place, and time.     Cranial Nerves: No cranial nerve  deficit.     Sensory: No sensory deficit.     Motor: No weakness, tremor, atrophy or abnormal muscle tone.     Coordination: Coordination normal.     Gait: Gait normal.     Deep  Tendon Reflexes: Reflexes are normal and symmetric. Reflexes normal.     Comments: No focal cerebellar signs   Psychiatric:        Mood and Affect: Mood normal.        Behavior: Behavior normal.        Thought Content: Thought content normal.           Assessment & Plan:   Problem List Items Addressed This Visit       Cardiovascular and Mediastinum   Essential hypertension - Primary    BP: (!) 148/70    Today not improved Per pt systolic at home is usually 160 or above  Continues headaches Some dizziness (unsure if side eff to amlodipine)  Today will change the lisinopril to losartan 50 mg daily  D/c amlodipine  Start low dose chlorthalidone (15 mg) daily  Disc poss side eff including freq urination   inst to continue to monitor at home  F/u 2 wk (it will take her 2 wk to get her medicine) for visit and labs If ha or dizziness continue or worsen will call or go to ER Hoping this will imp when bp is better controlled       Relevant Medications   losartan (COZAAR) 50 MG tablet   chlorthalidone (HYGROTEN) 15 MG tablet   Other Relevant Orders   Basic metabolic panel     Musculoskeletal and Integument   Osteoporosis    Doing well with Prolia  Bmp today in prep for next shot       Relevant Orders   Basic metabolic panel

## 2022-07-05 NOTE — Assessment & Plan Note (Signed)
BP: (!) 148/70    Today not improved Per pt systolic at home is usually 160 or above  Continues headaches Some dizziness (unsure if side eff to amlodipine)  Today will change the lisinopril to losartan 50 mg daily  D/c amlodipine  Start low dose chlorthalidone (15 mg) daily  Disc poss side eff including freq urination   inst to continue to monitor at home  F/u 2 wk (it will take her 2 wk to get her medicine) for visit and labs If ha or dizziness continue or worsen will call or go to ER Hoping this will imp when bp is better controlled

## 2022-07-05 NOTE — Patient Instructions (Addendum)
Stop the lisinopril  Stop the amldoipine    Start losartan 50 mg daily  Start the chlorthalidone (fluid pill) once daily in am  It will make you urinate more often right after you take it   Make sure you get enough fluids  If any side effects let us know If blood pressure gets lower than 90/50 let us know   I'm hoping the headaches get better   Labs today for Prolia   Take care of yourself   Flu shot today

## 2022-07-15 ENCOUNTER — Telehealth: Payer: Self-pay

## 2022-07-15 NOTE — Telephone Encounter (Signed)
Next injection due on 08/01/22 or after. OOP cost is $315. Lab done 07/05/22-CrCl is 50.76 mL/min. Calcium 8.8 NV on 08/03/22

## 2022-07-16 ENCOUNTER — Other Ambulatory Visit: Payer: Self-pay | Admitting: Family Medicine

## 2022-07-20 ENCOUNTER — Ambulatory Visit: Payer: Medicare HMO | Admitting: Family Medicine

## 2022-07-27 NOTE — Progress Notes (Deleted)
No show

## 2022-07-28 ENCOUNTER — Encounter: Payer: Self-pay | Admitting: Cardiovascular Disease

## 2022-07-28 ENCOUNTER — Ambulatory Visit: Payer: Medicare HMO | Attending: Cardiovascular Disease | Admitting: Cardiovascular Disease

## 2022-07-28 DIAGNOSIS — E7800 Pure hypercholesterolemia, unspecified: Secondary | ICD-10-CM

## 2022-07-28 DIAGNOSIS — I2510 Atherosclerotic heart disease of native coronary artery without angina pectoris: Secondary | ICD-10-CM

## 2022-07-28 DIAGNOSIS — I700 Atherosclerosis of aorta: Secondary | ICD-10-CM

## 2022-07-28 DIAGNOSIS — I10 Essential (primary) hypertension: Secondary | ICD-10-CM

## 2022-08-03 ENCOUNTER — Ambulatory Visit: Payer: Medicare HMO

## 2022-08-03 ENCOUNTER — Telehealth: Payer: Self-pay

## 2022-08-03 ENCOUNTER — Encounter: Payer: Self-pay | Admitting: Family Medicine

## 2022-08-03 ENCOUNTER — Ambulatory Visit (INDEPENDENT_AMBULATORY_CARE_PROVIDER_SITE_OTHER): Payer: Medicare HMO | Admitting: Family Medicine

## 2022-08-03 VITALS — BP 145/81 | HR 76 | Temp 97.4°F | Ht 62.5 in | Wt 109.5 lb

## 2022-08-03 DIAGNOSIS — M810 Age-related osteoporosis without current pathological fracture: Secondary | ICD-10-CM

## 2022-08-03 DIAGNOSIS — I10 Essential (primary) hypertension: Secondary | ICD-10-CM | POA: Diagnosis not present

## 2022-08-03 DIAGNOSIS — E538 Deficiency of other specified B group vitamins: Secondary | ICD-10-CM

## 2022-08-03 DIAGNOSIS — M8000XS Age-related osteoporosis with current pathological fracture, unspecified site, sequela: Secondary | ICD-10-CM

## 2022-08-03 LAB — BASIC METABOLIC PANEL
BUN: 16 mg/dL (ref 6–23)
CO2: 28 mEq/L (ref 19–32)
Calcium: 9.4 mg/dL (ref 8.4–10.5)
Chloride: 105 mEq/L (ref 96–112)
Creatinine, Ser: 0.74 mg/dL (ref 0.40–1.20)
GFR: 78.33 mL/min (ref >60.00)
Glucose, Bld: 85 mg/dL (ref 70–99)
Potassium: 4.4 mEq/L (ref 3.5–5.1)
Sodium: 141 mEq/L (ref 135–145)

## 2022-08-03 MED ORDER — CYANOCOBALAMIN 1000 MCG/ML IJ SOLN
1000.0000 ug | Freq: Once | INTRAMUSCULAR | Status: AC
  Administered 2022-08-03: 1000 ug via INTRAMUSCULAR

## 2022-08-03 MED ORDER — AMLODIPINE BESYLATE 5 MG PO TABS: 5.0000 mg | ORAL_TABLET | Freq: Every day | ORAL | 0 refills | Status: DC

## 2022-08-03 MED ORDER — DENOSUMAB 60 MG/ML ~~LOC~~ SOSY
60.0000 mg | PREFILLED_SYRINGE | Freq: Once | SUBCUTANEOUS | Status: AC
  Administered 2022-08-03: 60 mg via SUBCUTANEOUS

## 2022-08-03 NOTE — Progress Notes (Signed)
Subjective:    Patient ID: [name] [name], female    DOB: 1946/07/24, 76 y.o.   MRN: [id number]  HPI Pt presents for f/u of HTN  Wt Readings from Last 3 Encounters:  08/03/22 109 lb 8 oz (49.7 kg)  07/05/22 105 lb 4 oz (47.7 kg)  05/31/22 108 lb 4 oz (49.1 kg)   19.71 kg/m    HTN bp is stable today  No cp or palpitations or headaches or edema  No side effects to medicines  BP Readings from Last 3 Encounters:  08/03/22 (!) 158/74  07/05/22 (!) 148/70  05/31/22 (!) 144/71     Last visit we changed lisinopril to losartan 50 mg daily  We d/c amlodipine due to side eff of dizziness Started low dose chlorthalidone 15 mg daily - she is urinating more from that  Is tolerable so far    Headaches are better now - front, both sides / constant  Still gets dizzy despite stopping the amlodipine  Light headed/not spinning   Bp at home is up and down  No low readings     Pulse Readings from Last 3 Encounters:  08/03/22 76  07/05/22 90  05/31/22 82    Lab Results  Component Value Date   CREATININE 0.71 07/05/2022   BUN 18 07/05/2022   NA 141 07/05/2022   K 3.6 07/05/2022   CL 105 07/05/2022   CO2 29 07/05/2022   Due for prolia    Patient Active Problem List   Diagnosis Date Noted   Frontal headache 05/31/2022   Nausea 04/19/2022   GERD (gastroesophageal reflux disease) 02/02/2022   Microscopic hematuria 02/02/2022   Urinary frequency 04/14/2021   Low serum vitamin B12 08/07/2020   Prediabetes 03/07/2020   Tingling in extremities 03/07/2020   Neck pain 11/05/2019   Ganglion cyst of tendon sheath of right hand 05/17/2019   Medicare annual wellness visit, subsequent 10/16/2018   Benign neoplasm of ascending colon    Benign neoplasm of transverse colon    Melanosis, colon    Other specified diseases of esophagus    Stomach irritation    Constipation 01/24/2018   Compression fracture of L1 vertebra ([place]) 01/24/2018   Anxiety 01/09/2018   Coronary artery  calcification seen on CAT scan 11/25/2017   Special screening for malignant neoplasms, colon 11/01/2017   Estrogen deficiency 09/26/2017   Aortic calcification ([place]) 11/30/2016   History of patellar fracture 11/30/2016   Routine general medical examination at a health care facility 06/27/2016   Loss of weight 06/04/2016   History of vertebral compression fracture 01/19/2016   Esophageal dysphagia 06/28/2014   Chronic foot pain 10/27/2011   Low back pain 04/14/2011   FEVER BLISTER 11/17/2010   Essential hypertension 08/18/2009   BUNION 03/04/2009   Allergic rhinitis 01/10/2008   H/O goiter 02/09/2007   Hypothyroidism 02/09/2007   HYPERCHOLESTEROLEMIA 02/09/2007   History of depression 02/09/2007   OSTEOARTHRITIS 02/09/2007   Osteoporosis 02/09/2007   Disturbance in sleep behavior 02/09/2007   Past Medical History:  Diagnosis Date   Aortic calcification (HCC)    COPD (chronic obstructive pulmonary disease) ([place])    Depression    Hyperlipidemia    Hypertension    Hypothyroidism    Osteoarthritis    Osteoporosis    Shingles    arm 1/12   Sleep disorder    Tobacco abuse    past; quit 09   Past Surgical History:  Procedure Laterality Date   ABDOMINAL HYSTERECTOMY  BIOPSY THYROID     pos neoplasm, surgery 09/2006   bunions  06/1998   CATARACT EXTRACTION W/PHACO Left 04/20/2022   Procedure: CATARACT EXTRACTION PHACO AND INTRAOCULAR LENS PLACEMENT ([place]) LEFT;  Surgeon: Birder Robson, MD;  Location: [place];  Service: Ophthalmology;  Laterality: Left;  10.24 0058.5   CATARACT EXTRACTION W/PHACO Right 05/04/2022   Procedure: CATARACT EXTRACTION PHACO AND INTRAOCULAR LENS PLACEMENT ([place]) RIGHT;  Surgeon: Birder Robson, MD;  Location: [place];  Service: Ophthalmology;  Laterality: Right;  6.94 00:43.2   COLONOSCOPY WITH PROPOFOL N/A 03/15/2018   Procedure: COLONOSCOPY WITH PROPOFOL;  Surgeon: Virgel Manifold, MD;  Location: [facility];   Service: Endoscopy;  Laterality: N/A;   CORONARY ANGIOPLASTY     CXR-copd, ts partial compression fracture  12/2006   dexa  12/1996   ESOPHAGOGASTRODUODENOSCOPY (EGD) WITH PROPOFOL N/A 03/15/2018   Procedure: ESOPHAGOGASTRODUODENOSCOPY (EGD) WITH PROPOFOL;  Surgeon: Virgel Manifold, MD;  Location: [facility];  Service: Endoscopy;  Laterality: N/A;   GANGLION CYST EXCISION Right 05/16/2019   Procedure: REMOVAL GANGLION OF WRIST;  Surgeon: Corky Mull, MD;  Location: [facility];  Service: Orthopedics;  Laterality: Right;   hashimoto  02/1997   hyerectomy- cervical ca cells     LEFT HEART CATH AND CORONARY ANGIOGRAPHY Left 12/23/2017   Procedure: LEFT HEART CATH AND CORONARY ANGIOGRAPHY;  Surgeon: Minna Merritts, MD;  Location: [place] [facility];  Service: Cardiovascular;  Laterality: Left;   left wrist fracture     surgery   right thyroid lobectomy, goiter, thyroiditis  12/2006   stress fractures foot     THYROIDECTOMY  11/2009   Social History   Tobacco Use   Smoking status: Former    Packs/day: 0.50    Types: Cigarettes    Quit date: 08/07/2019    Years since quitting: 2.9   Smokeless tobacco: Never  Vaping Use   Vaping Use: Never used  Substance Use Topics   Alcohol use: No    Alcohol/week: 0.0 standard drinks of alcohol   Drug use: No   Family History  Problem Relation Age of Onset   Stroke Father    Hypertension Father    Diabetes Mother    Coronary artery disease Mother    Allergies  Allergen Reactions   Fosamax [Alendronate Sodium]     Dysphagia and heartburn    Raloxifene Hcl Other (See Comments)    Leg pain and cramps  Leg pain and cramps    Current Outpatient Medications on File Prior to Visit  Medication Sig Dispense Refill   albuterol (VENTOLIN HFA) 108 (90 Base) MCG/ACT inhaler Inhale 2 puffs into the lungs every 4 (four) hours as needed for wheezing or shortness of breath.     atorvastatin (LIPITOR) 80 MG tablet TAKE 1 TABLET EVERY DAY 90 tablet 1    Calcium Carbonate-Vit D-Min (CALCIUM 1200 PO) Take 1 tablet by mouth daily.      chlorthalidone (HYGROTEN) 15 MG tablet Take 1 tablet (15 mg total) by mouth daily. 90 tablet 0   cyclobenzaprine (FLEXERIL) 10 MG tablet TAKE 1 TABLET AT BEDTIME AS NEEDED 90 tablet 1   ezetimibe (ZETIA) 10 MG tablet TAKE 1 TABLET EVERY DAY 90 tablet 1   famotidine (PEPCID) 20 MG tablet TAKE 2 TABLETS TWICE DAILY 120 tablet 5   levothyroxine (SYNTHROID) 75 MCG tablet Take 1 tablet (75 mcg total) by mouth daily before breakfast. 90 tablet 3   lidocaine (XYLOCAINE) 2 % solution Use  as directed 15 mLs in the mouth or throat as needed for mouth pain. 100 mL 0   LORazepam (ATIVAN) 1 MG tablet TAKE 1 TABLET AT BEDTIME AS NEEDED FOR SLEEP 30 tablet 1   losartan (COZAAR) 50 MG tablet Take 1 tablet (50 mg total) by mouth daily. 90 tablet 3   nortriptyline (PAMELOR) 75 MG capsule TAKE 1 CAPSULE AT BEDTIME 90 capsule 1   traZODone (DESYREL) 50 MG tablet TAKE 1/2 TO 1 TABLET (25-50 MG TOTAL) BY MOUTH AT BEDTIME AS NEEDED FOR SLEEP. 90 tablet 1   No current facility-administered medications on file prior to visit.    Review of Systems  Constitutional:  Positive for fatigue. Negative for activity change, appetite change, fever and unexpected weight change.  HENT:  Negative for congestion, ear pain, rhinorrhea, sinus pressure and sore throat.   Eyes:  Negative for pain, redness and visual disturbance.  Respiratory:  Negative for cough, shortness of breath and wheezing.   Cardiovascular:  Negative for chest pain and palpitations.  Gastrointestinal:  Negative for abdominal pain, blood in stool, constipation and diarrhea.  Endocrine: Negative for polydipsia and polyuria.  Genitourinary:  Negative for dysuria, frequency and urgency.  Musculoskeletal:  Negative for arthralgias, back pain and myalgias.  Skin:  Negative for pallor and rash.  Allergic/Immunologic: Negative for environmental allergies.  Neurological:  Positive for  dizziness and headaches. Negative for tremors, seizures, syncope, facial asymmetry, speech difficulty, weakness, light-headedness and numbness.  Hematological:  Negative for adenopathy. Does not bruise/bleed easily.  Psychiatric/Behavioral:  Negative for decreased concentration and dysphoric mood. The patient is not nervous/anxious.        Objective:   Physical Exam Constitutional:      General: She is not in acute distress.    Appearance: Normal appearance. She is well-developed and normal weight. She is not ill-appearing or diaphoretic.  HENT:     Head: Normocephalic and atraumatic.  Eyes:     General: No scleral icterus.    Conjunctiva/sclera: Conjunctivae normal.     Pupils: Pupils are equal, round, and reactive to light.  Neck:     Thyroid: No thyromegaly.     Vascular: No carotid bruit or JVD.  Cardiovascular:     Rate and Rhythm: Normal rate and regular rhythm.     Heart sounds: Normal heart sounds.     No gallop.  Pulmonary:     Effort: Pulmonary effort is normal. No respiratory distress.     Breath sounds: Normal breath sounds. No wheezing or rales.  Abdominal:     General: There is no distension or abdominal bruit.     Palpations: Abdomen is soft.  Musculoskeletal:     Cervical back: Normal range of motion and neck supple.     Right lower leg: No edema.     Left lower leg: No edema.     Comments: Kyphosis   Lymphadenopathy:     Cervical: No cervical adenopathy.  Skin:    General: Skin is warm and dry.     Coloration: Skin is not pale.     Findings: No bruising or rash.  Neurological:     Mental Status: She is alert.     Cranial Nerves: No cranial nerve deficit.     Motor: No weakness.     Coordination: Coordination normal.     Deep Tendon Reflexes: Reflexes are normal and symmetric. Reflexes normal.  Psychiatric:        Mood and Affect: Mood normal.  Assessment & Plan:   Problem List Items Addressed This Visit       Cardiovascular and  Mediastinum   Essential hypertension - Primary    Bp still not at goal  bp in fair control at this time  BP Readings from Last 1 Encounters:  08/03/22 (!) 145/81  No improvement in dizziness off the amlodipine  Headaches still occur  Most recent labs reviewed  Disc lifstyle change with low sodium diet and exercise   Plan to continue losartan 50 mg daily  Chlorthalidone 15 mg daily  Add amlodipine back at 5 mg daily  Lab today  Consider inc losartan later if needed as long as renal status is stable  Plans to check bp at home  Plan 2 wk follow up  Also due for cardiology f/u-will call for that (looks like it was cancelled and pt is not sure why)       Relevant Medications   amLODipine (NORVASC) 5 MG tablet   Other Relevant Orders   Basic metabolic panel (Completed)     Musculoskeletal and Integument   Osteoporosis    For prolia shot today        Other   Low serum vitamin B12    For B12 shot today

## 2022-08-03 NOTE — Assessment & Plan Note (Signed)
Bp still not at goal  bp in fair control at this time  BP Readings from Last 1 Encounters:  08/03/22 (!) 145/81   No improvement in dizziness off the amlodipine  Headaches still occur  Most recent labs reviewed  Disc lifstyle change with low sodium diet and exercise   Plan to continue losartan 50 mg daily  Chlorthalidone 15 mg daily  Add amlodipine back at 5 mg daily  Lab today  Consider inc losartan later if needed as long as renal status is stable  Plans to check bp at home  Plan 2 wk follow up  Also due for cardiology f/u-will call for that (looks like it was cancelled and pt is not sure why)

## 2022-08-03 NOTE — Patient Instructions (Addendum)
Start back on amlodipine-this time 5 mg daily ([facility])  If you have side effects let us know Continue your other medicines    Labs today  We may also go up on losartan depending on your labs   Keep watching your blood pressure and headache situation  I hope the a normal blood pressure will stop the headache and dizziness   Watch the sodium /salt in your diet   Take care of yourself   Follow up here in about 2 weeks   Call Dr Donivan Scull office to re schedule your appt  [phone]   Let us know if that is not the correct number

## 2022-08-03 NOTE — Assessment & Plan Note (Signed)
For B12 shot today

## 2022-08-03 NOTE — Assessment & Plan Note (Signed)
For prolia shot today

## 2022-08-03 NOTE — Chronic Care Management (AMB) (Addendum)
Patient is on list for statin adherence gap.   Contacted mail order pharmacy with [employer], Atorvastatin '80mg'$   90ds was shipped to patient on 07/09/22 per pharmacist.  [name], CPP notified  Avel Sensor, [place]  [phone]

## 2022-08-06 ENCOUNTER — Other Ambulatory Visit: Payer: Self-pay | Admitting: *Deleted

## 2022-08-06 MED ORDER — AMLODIPINE BESYLATE 5 MG PO TABS: 5.0000 mg | ORAL_TABLET | Freq: Every day | ORAL | 1 refills | Status: DC

## 2022-08-11 ENCOUNTER — Telehealth: Payer: Self-pay

## 2022-08-11 NOTE — Telephone Encounter (Signed)
Last Prolia inj: 08/03/22 Next Prolia inj DUE: 01/31/23

## 2022-08-17 ENCOUNTER — Ambulatory Visit (INDEPENDENT_AMBULATORY_CARE_PROVIDER_SITE_OTHER): Payer: Medicare HMO | Admitting: Family Medicine

## 2022-08-17 ENCOUNTER — Encounter: Payer: Self-pay | Admitting: Family Medicine

## 2022-08-17 VITALS — BP 132/71 | HR 91 | Temp 97.7°F | Ht 62.5 in | Wt 109.0 lb

## 2022-08-17 DIAGNOSIS — F419 Anxiety disorder, unspecified: Secondary | ICD-10-CM | POA: Diagnosis not present

## 2022-08-17 DIAGNOSIS — I10 Essential (primary) hypertension: Secondary | ICD-10-CM | POA: Diagnosis not present

## 2022-08-17 MED ORDER — BUSPIRONE HCL 15 MG PO TABS: 7.5000 mg | ORAL_TABLET | Freq: Two times a day (BID) | ORAL | 3 refills | Status: DC

## 2022-08-17 NOTE — Assessment & Plan Note (Signed)
More anxious symptoms lately  Does not feel like thyroid is off and no missed doses  Reviewed stressors/ coping techniques/symptoms/ support sources/ tx options and side effects in detail today Is also down at times No change in stressors  Takes nortriptyline and trazodone and lorazepam at night  Declines counseling- enc her to start a journal  Enc some self care and indoor exercise  Given handout  Will try buspar 7.5 mg bid (with opt to inc dose if helpful later) Discussed expectations of this medication including time to effectiveness and mechanism of action, also poss of side effects (early and late)- including mental fuzziness, weight or appetite change, nausea and poss of worse dep or anxiety (even suicidal thoughts)  Pt voiced understanding and will stop med and update if this occurs  Inst to update [place] in 1 mo F/u in 3 mo

## 2022-08-17 NOTE — Assessment & Plan Note (Signed)
bp in fair control at this time -improved  BP Readings from Last 1 Encounters:  08/17/22 132/71   No changes needed Most recent labs reviewed  Disc lifstyle change with low sodium diet and exercise  Plan to continue  Losartan 50 mg dialy  Cholrthalidone 15 mg daily (last labs were ok) Amlodipine 5 mg daily  Headache is improving-she will continue to monitor this Inst to stop checking bp at home unless she gets a new cuff

## 2022-08-17 NOTE — Patient Instructions (Signed)
For anxiety try buspar  1/2 pill (7.5 mg) twice daily   If any side effects stop it and let us know   If it helps we can go up on the dose later Let us know how you are in about a month   Physical exercise helps - you can try any kind of program at home  Chair yoga  Look for videos on u tube or order a DVD A gym or senior center is great also   Also consider writing about your worries and how you feel  This helps also   If you want to see a counselor later let us know  Blood pressure looks good ! Stay on your current medicines   Follow up here in 3 months

## 2022-08-17 NOTE — Progress Notes (Signed)
Subjective:    Patient ID: [name] [name], female    DOB: Jul 11, 1946, 76 y.o.   MRN: [id number]  HPI Pt presents for f/u of HTN   Wt Readings from Last 3 Encounters:  08/17/22 109 lb (49.4 kg)  08/03/22 109 lb 8 oz (49.7 kg)  07/05/22 105 lb 4 oz (47.7 kg)   19.62 kg/m   Last visit bp was not at goal  Prior to that stopped amlodipine due to side eff of dizziness but no imp in that off of it  Had headaches   Decided to continue losartan 50 mg daily  Also chlorthalidone 15 mg daily  Add amlodipine back at 5 mg daily   Disc inc losartan dose later if needed and renal status is stable  Inst to watch bp at home Also enc her to schedule her routine cardiology f/u  BP Readings from Last 3 Encounters:  08/17/22 (!) 148/82  08/03/22 (!) 145/81  07/05/22 (!) 148/70   Pulse Readings from Last 3 Encounters:  08/17/22 91  08/03/22 76  07/05/22 90       Lab Results  Component Value Date   CREATININE 0.74 08/03/2022   BUN 16 08/03/2022   NA 141 08/03/2022   K 4.4 08/03/2022   CL 105 08/03/2022   CO2 28 08/03/2022   Headache is still there but much better than it was  Improving some  Dizziness is the same   At home her bp is is very high at home   Has appt with Dr Rockey Situ in [place]   More nervous  Not a lot of new stress   Worries about everything   Not much exercise - too cold out  Never sleeps   Planning xmas with family  Not overwhelmed   Trazodone Nortriptyline  Ativan   Caffeine - 1 coke per day  1 coffee in am is decaf   Lab Results  Component Value Date   TSH 2.81 04/12/2022   Patient Active Problem List   Diagnosis Date Noted   Frontal headache 05/31/2022   Nausea 04/19/2022   GERD (gastroesophageal reflux disease) 02/02/2022   Microscopic hematuria 02/02/2022   Urinary frequency 04/14/2021   Low serum vitamin B12 08/07/2020   Prediabetes 03/07/2020   Tingling in extremities 03/07/2020   Neck pain 11/05/2019   Ganglion cyst of tendon  sheath of right hand 05/17/2019   Medicare annual wellness visit, subsequent 10/16/2018   Benign neoplasm of ascending colon    Benign neoplasm of transverse colon    Melanosis, colon    Other specified diseases of esophagus    Stomach irritation    Constipation 01/24/2018   Compression fracture of L1 vertebra ([place]) 01/24/2018   Anxiety 01/09/2018   Coronary artery calcification seen on CAT scan 11/25/2017   Special screening for malignant neoplasms, colon 11/01/2017   Estrogen deficiency 09/26/2017   Aortic calcification ([place]) 11/30/2016   History of patellar fracture 11/30/2016   Routine general medical examination at a health care facility 06/27/2016   Loss of weight 06/04/2016   History of vertebral compression fracture 01/19/2016   Esophageal dysphagia 06/28/2014   Chronic foot pain 10/27/2011   Low back pain 04/14/2011   FEVER BLISTER 11/17/2010   Essential hypertension 08/18/2009   BUNION 03/04/2009   Allergic rhinitis 01/10/2008   H/O goiter 02/09/2007   Hypothyroidism 02/09/2007   HYPERCHOLESTEROLEMIA 02/09/2007   History of depression 02/09/2007   OSTEOARTHRITIS 02/09/2007   Osteoporosis 02/09/2007   Disturbance in sleep  behavior 02/09/2007   Past Medical History:  Diagnosis Date   Aortic calcification (HCC)    COPD (chronic obstructive pulmonary disease) ([place])    Depression    Hyperlipidemia    Hypertension    Hypothyroidism    Osteoarthritis    Osteoporosis    Shingles    arm 1/12   Sleep disorder    Tobacco abuse    past; quit 09   Past Surgical History:  Procedure Laterality Date   ABDOMINAL HYSTERECTOMY     BIOPSY THYROID     pos neoplasm, surgery 09/2006   bunions  06/1998   CATARACT EXTRACTION W/PHACO Left 04/20/2022   Procedure: CATARACT EXTRACTION PHACO AND INTRAOCULAR LENS PLACEMENT ([place]) LEFT;  Surgeon: Birder Robson, MD;  Location: [facility];  Service: Ophthalmology;  Laterality: Left;  10.24 0058.5   CATARACT EXTRACTION  W/PHACO Right 05/04/2022   Procedure: CATARACT EXTRACTION PHACO AND INTRAOCULAR LENS PLACEMENT ([place]) RIGHT;  Surgeon: Birder Robson, MD;  Location: [facility];  Service: Ophthalmology;  Laterality: Right;  6.94 00:43.2   COLONOSCOPY WITH PROPOFOL N/A 03/15/2018   Procedure: COLONOSCOPY WITH PROPOFOL;  Surgeon: Virgel Manifold, MD;  Location: [facility];  Service: Endoscopy;  Laterality: N/A;   CORONARY ANGIOPLASTY     CXR-copd, ts partial compression fracture  12/2006   dexa  12/1996   ESOPHAGOGASTRODUODENOSCOPY (EGD) WITH PROPOFOL N/A 03/15/2018   Procedure: ESOPHAGOGASTRODUODENOSCOPY (EGD) WITH PROPOFOL;  Surgeon: Virgel Manifold, MD;  Location: [facility];  Service: Endoscopy;  Laterality: N/A;   GANGLION CYST EXCISION Right 05/16/2019   Procedure: REMOVAL GANGLION OF WRIST;  Surgeon: Corky Mull, MD;  Location: [facility];  Service: Orthopedics;  Laterality: Right;   hashimoto  02/1997   hyerectomy- cervical ca cells     LEFT HEART CATH AND CORONARY ANGIOGRAPHY Left 12/23/2017   Procedure: LEFT HEART CATH AND CORONARY ANGIOGRAPHY;  Surgeon: Minna Merritts, MD;  Location: [facility];  Service: Cardiovascular;  Laterality: Left;   left wrist fracture     surgery   right thyroid lobectomy, goiter, thyroiditis  12/2006   stress fractures foot     THYROIDECTOMY  11/2009   Social History   Tobacco Use   Smoking status: Former    Packs/day: 0.50    Types: Cigarettes    Quit date: 08/07/2019    Years since quitting: 3.0   Smokeless tobacco: Never  Vaping Use   Vaping Use: Never used  Substance Use Topics   Alcohol use: No    Alcohol/week: 0.0 standard drinks of alcohol   Drug use: No   Family History  Problem Relation Age of Onset   Stroke Father    Hypertension Father    Diabetes Mother    Coronary artery disease Mother    Allergies  Allergen Reactions   Fosamax [Alendronate Sodium]     Dysphagia and heartburn    Raloxifene Hcl Other (See  Comments)    Leg pain and cramps  Leg pain and cramps    Current Outpatient Medications on File Prior to Visit  Medication Sig Dispense Refill   albuterol (VENTOLIN HFA) 108 (90 Base) MCG/ACT inhaler Inhale 2 puffs into the lungs every 4 (four) hours as needed for wheezing or shortness of breath.     amLODipine (NORVASC) 5 MG tablet Take 1 tablet (5 mg total) by mouth daily. 90 tablet 1   atorvastatin (LIPITOR) 80 MG tablet TAKE 1 TABLET EVERY DAY 90 tablet 1   Calcium Carbonate-Vit  D-Min (CALCIUM 1200 PO) Take 1 tablet by mouth daily.      chlorthalidone (HYGROTEN) 15 MG tablet Take 1 tablet (15 mg total) by mouth daily. 90 tablet 0   cyclobenzaprine (FLEXERIL) 10 MG tablet TAKE 1 TABLET AT BEDTIME AS NEEDED 90 tablet 1   ezetimibe (ZETIA) 10 MG tablet TAKE 1 TABLET EVERY DAY 90 tablet 1   famotidine (PEPCID) 20 MG tablet TAKE 2 TABLETS TWICE DAILY 120 tablet 5   levothyroxine (SYNTHROID) 75 MCG tablet Take 1 tablet (75 mcg total) by mouth daily before breakfast. 90 tablet 3   lidocaine (XYLOCAINE) 2 % solution Use as directed 15 mLs in the mouth or throat as needed for mouth pain. 100 mL 0   LORazepam (ATIVAN) 1 MG tablet TAKE 1 TABLET AT BEDTIME AS NEEDED FOR SLEEP 30 tablet 1   losartan (COZAAR) 50 MG tablet Take 1 tablet (50 mg total) by mouth daily. 90 tablet 3   nortriptyline (PAMELOR) 75 MG capsule TAKE 1 CAPSULE AT BEDTIME 90 capsule 1   traZODone (DESYREL) 50 MG tablet TAKE 1/2 TO 1 TABLET (25-50 MG TOTAL) BY MOUTH AT BEDTIME AS NEEDED FOR SLEEP. 90 tablet 1   No current facility-administered medications on file prior to visit.     Review of Systems  Constitutional:  Negative for activity change, appetite change, fatigue, fever and unexpected weight change.  HENT:  Negative for congestion, ear pain, rhinorrhea, sinus pressure and sore throat.   Eyes:  Negative for pain, redness and visual disturbance.  Respiratory:  Negative for cough, shortness of breath and wheezing.    Cardiovascular:  Negative for chest pain and palpitations.  Gastrointestinal:  Negative for abdominal pain, blood in stool, constipation and diarrhea.  Endocrine: Negative for polydipsia and polyuria.  Genitourinary:  Negative for dysuria, frequency and urgency.  Musculoskeletal:  Negative for arthralgias, back pain and myalgias.  Skin:  Negative for pallor and rash.  Allergic/Immunologic: Negative for environmental allergies.  Neurological:  Negative for dizziness, syncope and headaches.       Headache is much improved Still feels off/dizzy at times   Hematological:  Negative for adenopathy. Does not bruise/bleed easily.  Psychiatric/Behavioral:  Positive for dysphoric mood and sleep disturbance. Negative for agitation, confusion, decreased concentration and suicidal ideas. The patient is nervous/anxious.        Objective:   Physical Exam Constitutional:      General: She is not in acute distress.    Appearance: Normal appearance. She is well-developed and normal weight. She is not ill-appearing or diaphoretic.  HENT:     Head: Normocephalic and atraumatic.     Mouth/Throat:     Mouth: Mucous membranes are moist.  Eyes:     General: No scleral icterus.    Conjunctiva/sclera: Conjunctivae normal.     Pupils: Pupils are equal, round, and reactive to light.  Neck:     Thyroid: No thyromegaly.     Vascular: No carotid bruit or JVD.  Cardiovascular:     Rate and Rhythm: Normal rate and regular rhythm.     Heart sounds: Normal heart sounds.     No gallop.  Pulmonary:     Effort: Pulmonary effort is normal. No respiratory distress.     Breath sounds: Normal breath sounds. No wheezing or rales.  Abdominal:     General: There is no distension or abdominal bruit.     Palpations: Abdomen is soft.  Musculoskeletal:     Cervical back: Normal range of motion  and neck supple.     Right lower leg: No edema.     Left lower leg: No edema.     Comments: Kyphosis   Lymphadenopathy:      Cervical: No cervical adenopathy.  Skin:    General: Skin is warm and dry.     Coloration: Skin is not jaundiced or pale.     Findings: No bruising or rash.  Neurological:     Mental Status: She is alert.     Coordination: Coordination normal.     Deep Tendon Reflexes: Reflexes are normal and symmetric. Reflexes normal.  Psychiatric:        Attention and Perception: Attention normal.        Mood and Affect: Mood is anxious.        Speech: Speech normal.        Behavior: Behavior normal.        Cognition and Memory: Cognition normal.     Comments: Candidly discusses symptoms and stressors   Anxious            Assessment & Plan:   Problem List Items Addressed This Visit       Cardiovascular and Mediastinum   Essential hypertension - Primary    bp in fair control at this time -improved  BP Readings from Last 1 Encounters:  08/17/22 132/71  No changes needed Most recent labs reviewed  Disc lifstyle change with low sodium diet and exercise  Plan to continue  Losartan 50 mg dialy  Cholrthalidone 15 mg daily (last labs were ok) Amlodipine 5 mg daily  Headache is improving-she will continue to monitor this Inst to stop checking bp at home unless she gets a new cuff           Other   Anxiety    More anxious symptoms lately  Does not feel like thyroid is off and no missed doses  Reviewed stressors/ coping techniques/symptoms/ support sources/ tx options and side effects in detail today Is also down at times No change in stressors  Takes nortriptyline and trazodone and lorazepam at night  Declines counseling- enc her to start a journal  Enc some self care and indoor exercise  Given handout  Will try buspar 7.5 mg bid (with opt to inc dose if helpful later) Discussed expectations of this medication including time to effectiveness and mechanism of action, also poss of side effects (early and late)- including mental fuzziness, weight or appetite change, nausea and poss  of worse dep or anxiety (even suicidal thoughts)  Pt voiced understanding and will stop med and update if this occurs  Inst to update [place] in 1 mo F/u in 3 mo       Relevant Medications   busPIRone (BUSPAR) 15 MG tablet

## 2022-09-13 DIAGNOSIS — H353221 Exudative age-related macular degeneration, left eye, with active choroidal neovascularization: Secondary | ICD-10-CM | POA: Diagnosis not present

## 2022-09-13 DIAGNOSIS — H26491 Other secondary cataract, right eye: Secondary | ICD-10-CM | POA: Diagnosis not present

## 2022-09-20 NOTE — Progress Notes (Deleted)
Cardiology Office Note  Date:  09/20/2022   ID:  [name] [name], DOB [birth year], MRN [id number]  PCP:  [name] Greenspan, MD   No chief complaint on file.   HPI:  [name] is a 77 year old woman with history of Former smoker -quit in 09 Known aortic calcification on imaging /CT scan COPD Chronic back pain  Chronic chest pain Cardiac catheterization April 2019 with no significant coronary disease but she does have moderate diffuse calcification Presents for f/u of her chest pain, positive stress test, severe coronary calcification on CT scan,  cardiac catheterization  Last seen by myself in clinic November 2021 Seen in the emergency room May 18, 2022 for hypertension In the ER blood pressure 149/72   Weight low but stable Numbers reviewed from the past several years Chronic back pain, occasional chest discomfort but overall feels things are about the same  Tolerating Lipitor with Zetia , some medication noncompliance Sedentary, no regular exercise program Works in her garden  EKG personally reviewed by myself on todays visit Shows normal sinus rhythm rate 98 bpm no significant ST-T wave changes  Other past medical history reviewed Cardiac catheterization December 23, 2017 No significant coronary disease noted, moderate diffuse calcification noted  CT chest  2015 Images reviewed with her in detail Moderate aortic atherosclerosis particularly in the arch Very mild if any in the carotids Mild to moderate in the distal descending aorta, iliacs  PMH:   has a past medical history of Aortic calcification (HCC), COPD (chronic obstructive pulmonary disease) ([place]), Depression, Hyperlipidemia, Hypertension, Hypothyroidism, Osteoarthritis, Osteoporosis, Shingles, Sleep disorder, and Tobacco abuse.  PSH:    Past Surgical History:  Procedure Laterality Date   ABDOMINAL HYSTERECTOMY     BIOPSY THYROID     pos neoplasm, surgery 09/2006   bunions  06/1998   CATARACT  EXTRACTION W/PHACO Left 04/20/2022   Procedure: CATARACT EXTRACTION PHACO AND INTRAOCULAR LENS PLACEMENT ([place]) LEFT;  Surgeon: Birder Robson, MD;  Location: [facility];  Service: Ophthalmology;  Laterality: Left;  10.24 0058.5   CATARACT EXTRACTION W/PHACO Right 05/04/2022   Procedure: CATARACT EXTRACTION PHACO AND INTRAOCULAR LENS PLACEMENT ([place]) RIGHT;  Surgeon: Birder Robson, MD;  Location: [facility];  Service: Ophthalmology;  Laterality: Right;  6.94 00:43.2   COLONOSCOPY WITH PROPOFOL N/A 03/15/2018   Procedure: COLONOSCOPY WITH PROPOFOL;  Surgeon: Virgel Manifold, MD;  Location: [facility];  Service: Endoscopy;  Laterality: N/A;   CORONARY ANGIOPLASTY     CXR-copd, ts partial compression fracture  12/2006   dexa  12/1996   ESOPHAGOGASTRODUODENOSCOPY (EGD) WITH PROPOFOL N/A 03/15/2018   Procedure: ESOPHAGOGASTRODUODENOSCOPY (EGD) WITH PROPOFOL;  Surgeon: Virgel Manifold, MD;  Location: [facility];  Service: Endoscopy;  Laterality: N/A;   GANGLION CYST EXCISION Right 05/16/2019   Procedure: REMOVAL GANGLION OF WRIST;  Surgeon: Corky Mull, MD;  Location: [facility];  Service: Orthopedics;  Laterality: Right;   hashimoto  02/1997   hyerectomy- cervical ca cells     LEFT HEART CATH AND CORONARY ANGIOGRAPHY Left 12/23/2017   Procedure: LEFT HEART CATH AND CORONARY ANGIOGRAPHY;  Surgeon: Minna Merritts, MD;  Location: [facility];  Service: Cardiovascular;  Laterality: Left;   left wrist fracture     surgery   right thyroid lobectomy, goiter, thyroiditis  12/2006   stress fractures foot     THYROIDECTOMY  11/2009    Current Outpatient Medications  Medication Sig Dispense Refill   albuterol (VENTOLIN HFA) 108 (90 Base) MCG/ACT  inhaler Inhale 2 puffs into the lungs every 4 (four) hours as needed for wheezing or shortness of breath.     amLODipine (NORVASC) 5 MG tablet Take 1 tablet (5 mg total) by mouth daily. 90 tablet 1   atorvastatin  (LIPITOR) 80 MG tablet TAKE 1 TABLET EVERY DAY 90 tablet 1   busPIRone (BUSPAR) 15 MG tablet Take 0.5 tablets (7.5 mg total) by mouth 2 (two) times daily. 90 tablet 3   Calcium Carbonate-Vit D-Min (CALCIUM 1200 PO) Take 1 tablet by mouth daily.      chlorthalidone (HYGROTEN) 15 MG tablet Take 1 tablet (15 mg total) by mouth daily. 90 tablet 0   cyclobenzaprine (FLEXERIL) 10 MG tablet TAKE 1 TABLET AT BEDTIME AS NEEDED 90 tablet 1   ezetimibe (ZETIA) 10 MG tablet TAKE 1 TABLET EVERY DAY 90 tablet 1   famotidine (PEPCID) 20 MG tablet TAKE 2 TABLETS TWICE DAILY 120 tablet 5   levothyroxine (SYNTHROID) 75 MCG tablet Take 1 tablet (75 mcg total) by mouth daily before breakfast. 90 tablet 3   lidocaine (XYLOCAINE) 2 % solution Use as directed 15 mLs in the mouth or throat as needed for mouth pain. 100 mL 0   LORazepam (ATIVAN) 1 MG tablet TAKE 1 TABLET AT BEDTIME AS NEEDED FOR SLEEP 30 tablet 1   losartan (COZAAR) 50 MG tablet Take 1 tablet (50 mg total) by mouth daily. 90 tablet 3   nortriptyline (PAMELOR) 75 MG capsule TAKE 1 CAPSULE AT BEDTIME 90 capsule 1   traZODone (DESYREL) 50 MG tablet TAKE 1/2 TO 1 TABLET (25-50 MG TOTAL) BY MOUTH AT BEDTIME AS NEEDED FOR SLEEP. 90 tablet 1   No current facility-administered medications for this visit.     Allergies:   Fosamax [alendronate sodium] and Raloxifene hcl   Social History:  The patient  reports that she quit smoking about 3 years ago. Her smoking use included cigarettes. She smoked an average of .5 packs per day. She has never used smokeless tobacco. She reports that she does not drink alcohol and does not use drugs.   Family History:   family history includes Coronary artery disease in her mother; Diabetes in her mother; Hypertension in her father; Stroke in her father.   Review of Systems: Review of Systems  Constitutional: Negative.   Respiratory: Negative.    Cardiovascular:  Positive for chest pain.  Gastrointestinal:  Positive for  heartburn.  Musculoskeletal: Negative.   Neurological: Negative.   Psychiatric/Behavioral: Negative.    All other systems reviewed and are negative.   PHYSICAL EXAM: VS:  There were no vitals taken for this visit. , BMI There is no height or weight on file to calculate BMI. Constitutional:  oriented to person, place, and time. No distress.  HENT:  Head: Grossly normal Eyes:  no discharge. No scleral icterus.  Neck: No JVD, no carotid bruits  Cardiovascular: Regular rate and rhythm, no murmurs appreciated Pulmonary/Chest: Clear to auscultation bilaterally, no wheezes or rails Abdominal: Soft.  no distension.  no tenderness.  Musculoskeletal: Normal range of motion Neurological:  normal muscle tone. Coordination normal. No atrophy Skin: Skin warm and dry Psychiatric: normal affect, pleasant  Recent Labs: 04/12/2022: ALT 15; TSH 2.81 05/18/2022: Hemoglobin 13.1; Platelets 211 08/03/2022: BUN 16; Creatinine, Ser 0.74; Potassium 4.4; Sodium 141    Lipid Panel Lab Results  Component Value Date   CHOL 168 04/12/2022   HDL 68.90 04/12/2022   LDLCALC 89 04/12/2022   TRIG 52.0 04/12/2022  Wt Readings from Last 3 Encounters:  08/17/22 109 lb (49.4 kg)  08/03/22 109 lb 8 oz (49.7 kg)  07/05/22 105 lb 4 oz (47.7 kg)     ASSESSMENT AND PLAN:  Aortic calcification (HCC) On Lipitor 80 Zetia 10 mg daily Stressed importance of medication compliance  Hyperlipidemia  Lipitor 80 daily,   Zetia 10 mg daily Ideally goal LDL less than 70, recommend she take medications daily  Chest pain, unspecified type -   cardiac catheterization showing heavy  calcification with no significant stenosis, 2019 Smoking cessation recommended, compliance with Lipitor and Zetia  Coronary artery calcification seen on CAT scan Calcification noted in the LAD Catheterization results as above  TOBACCO USE, QUIT Reports that she quit in 2009 Denies shortness of breath on  exertion  PVCs Asymptomatic, for any worsening symptoms would add beta-blocker  GERD Takes omeprazole as needed  Essential hypertension Blood pressure is well controlled on today's visit. No changes made to the medications.    Total encounter time more than 25 minutes  Greater than 50% was spent in counseling and coordination of care with the patient    No orders of the defined types were placed in this encounter.    Signed, Esmond Plants, M.D., Ph.D. 09/20/2022  [place], [place]

## 2022-09-21 ENCOUNTER — Ambulatory Visit: Payer: Medicare HMO | Attending: Cardiovascular Disease | Admitting: Cardiovascular Disease

## 2022-09-21 DIAGNOSIS — E7800 Pure hypercholesterolemia, unspecified: Secondary | ICD-10-CM

## 2022-09-21 DIAGNOSIS — I10 Essential (primary) hypertension: Secondary | ICD-10-CM

## 2022-09-21 DIAGNOSIS — I700 Atherosclerosis of aorta: Secondary | ICD-10-CM

## 2022-09-21 DIAGNOSIS — R7303 Prediabetes: Secondary | ICD-10-CM

## 2022-09-21 DIAGNOSIS — I2510 Atherosclerotic heart disease of native coronary artery without angina pectoris: Secondary | ICD-10-CM

## 2022-09-22 ENCOUNTER — Encounter: Payer: Self-pay | Admitting: Cardiovascular Disease

## 2022-09-29 ENCOUNTER — Other Ambulatory Visit: Payer: Self-pay | Admitting: Family Medicine

## 2022-09-30 NOTE — Telephone Encounter (Signed)
I sent it   She is on several things for sleep and anxiety  Try not to take this unless abs needed   Thanks

## 2022-09-30 NOTE — Telephone Encounter (Signed)
Name of Medication: Ativan Name of Pharmacy: [facility]) Mail order Last Fill or Written Date and Quantity: 06/25/22 #30 tabs 1 refills Last Office Visit and Type:08/17/22 f/u Next Office Visit and Type:11/16/22 f/u

## 2022-10-04 NOTE — Telephone Encounter (Signed)
Pt notified of Dr. Tower's comments  

## 2022-10-06 ENCOUNTER — Other Ambulatory Visit: Payer: Self-pay | Admitting: Family Medicine

## 2022-11-15 ENCOUNTER — Ambulatory Visit: Payer: Medicare HMO | Admitting: Dermatology

## 2022-11-15 VITALS — BP 160/87 | HR 89

## 2022-11-15 DIAGNOSIS — L578 Other skin changes due to chronic exposure to nonionizing radiation: Secondary | ICD-10-CM

## 2022-11-15 DIAGNOSIS — L820 Inflamed seborrheic keratosis: Secondary | ICD-10-CM

## 2022-11-15 DIAGNOSIS — L821 Other seborrheic keratosis: Secondary | ICD-10-CM

## 2022-11-15 NOTE — Progress Notes (Unsigned)
   New Patient Visit  Subjective  [name] [name] is a 77 y.o. female who presents for the following: Skin Problem (The patient has spots on her face to be evaluated, new or changing and the patient has concerns that these could be cancer. ).  The following portions of the chart were reviewed this encounter and updated as appropriate:   Tobacco  Allergies  Meds  Problems  Med Hx  Surg Hx  Fam Hx     Review of Systems:  No other skin or systemic complaints except as noted in HPI or Assessment and Plan.  Objective  Well appearing patient in no apparent distress; mood and affect are within normal limits.  A focused examination was performed including face. Relevant physical exam findings are noted in the Assessment and Plan.  left cheek 2.5 cm brown waxy patch      Assessment & Plan  Inflamed seborrheic keratosis left cheek  Symptomatic, irritating, patient would like treated.   Destruction of lesion - left cheek Complexity: simple   Destruction method: cryotherapy   Informed consent: discussed and consent obtained   Timeout:  patient name, date of birth, surgical site, and procedure verified Lesion destroyed using liquid nitrogen: Yes   Region frozen until ice ball extended beyond lesion: Yes   Outcome: patient tolerated procedure well with no complications   Post-procedure details: wound care instructions given    Seborrheic Keratoses - Stuck-on, waxy, tan-brown papules and/or plaques  - Benign-appearing - Discussed benign etiology and prognosis. - Observe - Call for any changes  Actinic Damage - chronic, secondary to cumulative UV radiation exposure/sun exposure over time - diffuse scaly erythematous macules with underlying dyspigmentation - Recommend daily broad spectrum sunscreen SPF 30+ to sun-exposed areas, reapply every 2 hours as needed.  - Recommend staying in the shade or wearing long sleeves, sun glasses (UVA+UVB protection) and wide brim hats (4-inch  brim around the entire circumference of the hat). - Call for new or changing lesions.    Return in about 6 months (around 05/18/2023) for ISk .  I, Marye Round, CMA, am acting as scribe for Sarina Ser, MD .  Documentation: I have reviewed the above documentation for accuracy and completeness, and I agree with the above.  Sarina Ser, MD

## 2022-11-15 NOTE — Patient Instructions (Addendum)
Cryotherapy Aftercare  Wash gently with soap and water everyday.   Apply Vaseline and Band-Aid daily until healed.     Due to recent changes in healthcare laws, you may see results of your pathology and/or laboratory studies on MyChart before the doctors have had a chance to review them. We understand that in some cases there may be results that are confusing or concerning to you. Please understand that not all results are received at the same time and often the doctors may need to interpret multiple results in order to provide you with the best plan of care or course of treatment. Therefore, we ask that you please give us 2 business days to thoroughly review all your results before contacting the office for clarification. Should we see a critical lab result, you will be contacted sooner.   If You Need Anything After Your Visit  If you have any questions or concerns for your doctor, please call our main line at [phone] and press option 4 to reach your doctor's medical assistant. If no one answers, please leave a voicemail as directed and we will return your call as soon as possible. Messages left after 4 pm will be answered the following business day.   You may also send us a message via MyChart. We typically respond to MyChart messages within 1-2 business days.  For prescription refills, please ask your pharmacy to contact our office. Our fax number is [phone].  If you have an urgent issue when the clinic is closed that cannot wait until the next business day, you can page your doctor at the number below.    Please note that while we do our best to be available for urgent issues outside of office hours, we are not available 24/7.   If you have an urgent issue and are unable to reach us, you may choose to seek medical care at your doctor's office, retail clinic, urgent care center, or emergency room.  If you have a medical emergency, please immediately call 911 or go to the  emergency department.  Pager Numbers  - Dr. Kowalski: [phone]  - Dr. Moye: [phone]  - Dr. Stewart: [phone]  In the event of inclement weather, please call our main line at [phone] for an update on the status of any delays or closures.  Dermatology Medication Tips: Please keep the boxes that topical medications come in in order to help keep track of the instructions about where and how to use these. Pharmacies typically print the medication instructions only on the boxes and not directly on the medication tubes.   If your medication is too expensive, please contact our office at [phone] option 4 or send us a message through MyChart.   We are unable to tell what your co-pay for medications will be in advance as this is different depending on your insurance coverage. However, we may be able to find a substitute medication at lower cost or fill out paperwork to get insurance to cover a needed medication.   If a prior authorization is required to get your medication covered by your insurance company, please allow us 1-2 business days to complete this process.  Drug prices often vary depending on where the prescription is filled and some pharmacies may offer cheaper prices.  The website www.goodrx.com contains coupons for medications through different pharmacies. The prices here do not account for what the cost may be with help from insurance (it may be cheaper with your insurance), but the website can   give you the price if you did not use any insurance.  - You can print the associated coupon and take it with your prescription to the pharmacy.  - You may also stop by our office during regular business hours and pick up a GoodRx coupon card.  - If you need your prescription sent electronically to a different pharmacy, notify our office through [place] MyChart or by phone at [phone] option 4.     Si Usted Necesita Algo Despus de Su Visita  Tambin puede  enviarnos un mensaje a travs de MyChart. Por lo general respondemos a los mensajes de MyChart en el transcurso de 1 a 2 das hbiles.  Para renovar recetas, por favor pida a su farmacia que se ponga en contacto con nuestra oficina. Nuestro nmero de fax es el [phone].  Si tiene un asunto urgente cuando la clnica est cerrada y que no puede esperar hasta el siguiente da hbil, puede llamar/localizar a su doctor(a) al nmero que aparece a continuacin.   Por favor, tenga en cuenta que aunque hacemos todo lo posible para estar disponibles para asuntos urgentes fuera del horario de oficina, no estamos disponibles las 24 horas del da, los 7 das de la semana.   Si tiene un problema urgente y no puede comunicarse con nosotros, puede optar por buscar atencin mdica  en el consultorio de su doctor(a), en una clnica privada, en un centro de atencin urgente o en una sala de emergencias.  Si tiene una emergencia mdica, por favor llame inmediatamente al 911 o vaya a la sala de emergencias.  Nmeros de bper  - Dr. Kowalski: [phone]  - Dra. Moye: [phone]  - Dra. Stewart: [phone]  En caso de inclemencias del tiempo, por favor llame a nuestra lnea principal al [phone] para una actualizacin sobre el estado de cualquier retraso o cierre.  Consejos para la medicacin en dermatologa: Por favor, guarde las cajas en las que vienen los medicamentos de uso tpico para ayudarle a seguir las instrucciones sobre dnde y cmo usarlos. Las farmacias generalmente imprimen las instrucciones del medicamento slo en las cajas y no directamente en los tubos del medicamento.   Si su medicamento es muy caro, por favor, pngase en contacto con nuestra oficina llamando al [phone] y presione la opcin 4 o envenos un mensaje a travs de MyChart.   No podemos decirle cul ser su copago por los medicamentos por adelantado ya que esto es diferente dependiendo de la cobertura de su seguro.  Sin embargo, es posible que podamos encontrar un medicamento sustituto a menor costo o llenar un formulario para que el seguro cubra el medicamento que se considera necesario.   Si se requiere una autorizacin previa para que su compaa de seguros cubra su medicamento, por favor permtanos de 1 a 2 das hbiles para completar este proceso.  Los precios de los medicamentos varan con frecuencia dependiendo del lugar de dnde se surte la receta y alguna farmacias pueden ofrecer precios ms baratos.  El sitio web www.goodrx.com tiene cupones para medicamentos de diferentes farmacias. Los precios aqu no tienen en cuenta lo que podra costar con la ayuda del seguro (puede ser ms barato con su seguro), pero el sitio web puede darle el precio si no utiliz ningn seguro.  - Puede imprimir el cupn correspondiente y llevarlo con su receta a la farmacia.  - Tambin puede pasar por nuestra oficina durante el horario de atencin regular y recoger una tarjeta de cupones de GoodRx.  -   Si necesita que su receta se enve electrnicamente a una farmacia diferente, informe a nuestra oficina a travs de MyChart de [place] o por telfono llamando al [phone] y presione la opcin 4.  

## 2022-11-16 ENCOUNTER — Encounter: Payer: Self-pay | Admitting: Family Medicine

## 2022-11-16 ENCOUNTER — Ambulatory Visit (INDEPENDENT_AMBULATORY_CARE_PROVIDER_SITE_OTHER): Payer: Medicare HMO | Admitting: Family Medicine

## 2022-11-16 ENCOUNTER — Encounter: Payer: Self-pay | Admitting: Dermatology

## 2022-11-16 VITALS — BP 128/70 | HR 71 | Temp 97.7°F | Ht 62.5 in | Wt 108.4 lb

## 2022-11-16 DIAGNOSIS — G8929 Other chronic pain: Secondary | ICD-10-CM | POA: Diagnosis not present

## 2022-11-16 DIAGNOSIS — M79673 Pain in unspecified foot: Secondary | ICD-10-CM

## 2022-11-16 DIAGNOSIS — E538 Deficiency of other specified B group vitamins: Secondary | ICD-10-CM | POA: Diagnosis not present

## 2022-11-16 DIAGNOSIS — R519 Headache, unspecified: Secondary | ICD-10-CM | POA: Diagnosis not present

## 2022-11-16 DIAGNOSIS — I10 Essential (primary) hypertension: Secondary | ICD-10-CM

## 2022-11-16 DIAGNOSIS — F419 Anxiety disorder, unspecified: Secondary | ICD-10-CM

## 2022-11-16 MED ORDER — CYANOCOBALAMIN 1000 MCG/ML IJ SOLN
1000.0000 ug | Freq: Once | INTRAMUSCULAR | Status: AC
  Administered 2022-11-16: 1000 ug via INTRAMUSCULAR

## 2022-11-16 NOTE — Assessment & Plan Note (Signed)
B12 shot given today  

## 2022-11-16 NOTE — Assessment & Plan Note (Signed)
Doing better with addn of buspar 7.5 mg bid Tolerates well Reviewed stressors/ coping techniques/symptoms/ support sources/ tx options and side effects in detail today  Continues nortriptyline, trazodone Lorazepam at night  Sleep disorder is still severe

## 2022-11-16 NOTE — Patient Instructions (Addendum)
Try and get protein with every meal   Meat, fish, dairy , soy , dried beans , nuts , nut butters  Get some of this with every meal   Blood pressure looks much better   If you want to to check at home get a new cuff   Eat healthy  Be as active as you can be safely  Handicapped parking form done today   B12 shot today

## 2022-11-16 NOTE — Assessment & Plan Note (Signed)
Pt asks for handicapped parking form  She can only walk short distances due to OA from old injuries

## 2022-11-16 NOTE — Assessment & Plan Note (Signed)
Much improved with better bp control

## 2022-11-16 NOTE — Progress Notes (Signed)
Subjective:    Patient ID: [name] [name], female    DOB: 1946-06-13, 77 y.o.   MRN: [id number]  HPI Pt presents for f/u of HTN and chronic health problems Prediabetes Anxiety  B12 shot due   Handicapped parking form   Wt Readings from Last 3 Encounters:  11/16/22 108 lb 6 oz (49.2 kg)  08/17/22 109 lb (49.4 kg)  08/03/22 109 lb 8 oz (49.7 kg)   19.51 kg/m  Stays busy  Feels ok    HTN bp is stable today  No cp or palpitations or headaches or edema  No side effects to medicines  BP Readings from Last 3 Encounters:  11/16/22 (!) 148/86  11/15/22 (!) 160/87  08/17/22 132/71   BP Readings from Last 3 Encounters:  11/16/22 128/70  11/15/22 (!) 160/87  08/17/22 132/71    Losartan 50 mg daily  Chlorthalidone 15 mg daily  Amlodipine 5 mg daily   Her cuff at home runs high   Headache is better    Lab Results  Component Value Date   CREATININE 0.74 08/03/2022   BUN 16 08/03/2022   NA 141 08/03/2022   K 4.4 08/03/2022   CL 105 08/03/2022   CO2 28 08/03/2022   GFR was 78.3  Hypothyroidism  Pt has no clinical changes No change in energy level/ hair or skin/ edema and no tremor Lab Results  Component Value Date   TSH 2.81 04/12/2022     Prediabetes  Lab Results  Component Value Date   HGBA1C 6.4 04/12/2022     Anxiety Declines counseling Last visit we added buspar 7.5 mg bid Thinks is is helping  Doing better overall Stress level is ok  Not worried about a lot    Takes nortriptyline Trazodone Lorazepam at night  Long h/o sleep disorder  Sleep is still terrible - nothing makes it better   Appetite is up and down  Tries to get in fruit  Likes vegetable  Not a lot of protein   Walking in the yard for exercise for 1 minute at a time  Needs handicapped parking form  Legs and feet hurt  Has had arthritis and broken bones in the past   Patient Active Problem List   Diagnosis Date Noted   Frontal headache 05/31/2022   Nausea  04/19/2022   GERD (gastroesophageal reflux disease) 02/02/2022   Microscopic hematuria 02/02/2022   Urinary frequency 04/14/2021   Low serum vitamin B12 08/07/2020   Prediabetes 03/07/2020   Tingling in extremities 03/07/2020   Neck pain 11/05/2019   Ganglion cyst of tendon sheath of right hand 05/17/2019   Medicare annual wellness visit, subsequent 10/16/2018   Benign neoplasm of ascending colon    Benign neoplasm of transverse colon    Melanosis, colon    Other specified diseases of esophagus    Stomach irritation    Constipation 01/24/2018   Compression fracture of L1 vertebra ([place]) 01/24/2018   Anxiety 01/09/2018   Coronary artery calcification seen on CAT scan 11/25/2017   Special screening for malignant neoplasms, colon 11/01/2017   Estrogen deficiency 09/26/2017   Aortic calcification ([place]) 11/30/2016   History of patellar fracture 11/30/2016   Routine general medical examination at a health care facility 06/27/2016   Loss of weight 06/04/2016   History of vertebral compression fracture 01/19/2016   Esophageal dysphagia 06/28/2014   Chronic foot pain 10/27/2011   Low back pain 04/14/2011   FEVER BLISTER 11/17/2010   Essential hypertension 08/18/2009  BUNION 03/04/2009   Allergic rhinitis 01/10/2008   H/O goiter 02/09/2007   Hypothyroidism 02/09/2007   HYPERCHOLESTEROLEMIA 02/09/2007   History of depression 02/09/2007   OSTEOARTHRITIS 02/09/2007   Osteoporosis 02/09/2007   Disturbance in sleep behavior 02/09/2007   Past Medical History:  Diagnosis Date   Aortic calcification (HCC)    COPD (chronic obstructive pulmonary disease) ([place])    Depression    Hyperlipidemia    Hypertension    Hypothyroidism    Osteoarthritis    Osteoporosis    Shingles    arm 1/12   Sleep disorder    Tobacco abuse    past; quit 09   Past Surgical History:  Procedure Laterality Date   ABDOMINAL HYSTERECTOMY     BIOPSY THYROID     pos neoplasm, surgery 09/2006   bunions   06/1998   CATARACT EXTRACTION W/PHACO Left 04/20/2022   Procedure: CATARACT EXTRACTION PHACO AND INTRAOCULAR LENS PLACEMENT ([place]) LEFT;  Surgeon: Birder Robson, MD;  Location: [facility];  Service: Ophthalmology;  Laterality: Left;  10.24 0058.5   CATARACT EXTRACTION W/PHACO Right 05/04/2022   Procedure: CATARACT EXTRACTION PHACO AND INTRAOCULAR LENS PLACEMENT ([place]) RIGHT;  Surgeon: Birder Robson, MD;  Location: [facility];  Service: Ophthalmology;  Laterality: Right;  6.94 00:43.2   COLONOSCOPY WITH PROPOFOL N/A 03/15/2018   Procedure: COLONOSCOPY WITH PROPOFOL;  Surgeon: Virgel Manifold, MD;  Location: [facility];  Service: Endoscopy;  Laterality: N/A;   CORONARY ANGIOPLASTY     CXR-copd, ts partial compression fracture  12/2006   dexa  12/1996   ESOPHAGOGASTRODUODENOSCOPY (EGD) WITH PROPOFOL N/A 03/15/2018   Procedure: ESOPHAGOGASTRODUODENOSCOPY (EGD) WITH PROPOFOL;  Surgeon: Virgel Manifold, MD;  Location: [facility];  Service: Endoscopy;  Laterality: N/A;   GANGLION CYST EXCISION Right 05/16/2019   Procedure: REMOVAL GANGLION OF WRIST;  Surgeon: Corky Mull, MD;  Location: [facility];  Service: Orthopedics;  Laterality: Right;   hashimoto  02/1997   hyerectomy- cervical ca cells     LEFT HEART CATH AND CORONARY ANGIOGRAPHY Left 12/23/2017   Procedure: LEFT HEART CATH AND CORONARY ANGIOGRAPHY;  Surgeon: Minna Merritts, MD;  Location: [facility];  Service: Cardiovascular;  Laterality: Left;   left wrist fracture     surgery   right thyroid lobectomy, goiter, thyroiditis  12/2006   stress fractures foot     THYROIDECTOMY  11/2009   Social History   Tobacco Use   Smoking status: Former    Packs/day: 0.50    Types: Cigarettes    Quit date: 08/07/2019    Years since quitting: 3.2   Smokeless tobacco: Never  Vaping Use   Vaping Use: Never used  Substance Use Topics   Alcohol use: No    Alcohol/week: 0.0 standard drinks of alcohol    Drug use: No   Family History  Problem Relation Age of Onset   Stroke Father    Hypertension Father    Diabetes Mother    Coronary artery disease Mother    Allergies  Allergen Reactions   Fosamax [Alendronate Sodium]     Dysphagia and heartburn    Raloxifene Hcl Other (See Comments)    Leg pain and cramps  Leg pain and cramps    Current Outpatient Medications on File Prior to Visit  Medication Sig Dispense Refill   albuterol (VENTOLIN HFA) 108 (90 Base) MCG/ACT inhaler Inhale 2 puffs into the lungs every 4 (four) hours as needed for wheezing or shortness of breath.  amLODipine (NORVASC) 5 MG tablet Take 1 tablet (5 mg total) by mouth daily. 90 tablet 1   atorvastatin (LIPITOR) 80 MG tablet TAKE 1 TABLET EVERY DAY 90 tablet 0   busPIRone (BUSPAR) 15 MG tablet Take 0.5 tablets (7.5 mg total) by mouth 2 (two) times daily. 90 tablet 3   Calcium Carbonate-Vit D-Min (CALCIUM 1200 PO) Take 1 tablet by mouth daily.      chlorthalidone (HYGROTEN) 15 MG tablet Take 1 tablet (15 mg total) by mouth daily. 90 tablet 0   cyclobenzaprine (FLEXERIL) 10 MG tablet TAKE 1 TABLET AT BEDTIME AS NEEDED 90 tablet 1   ezetimibe (ZETIA) 10 MG tablet TAKE 1 TABLET EVERY DAY 90 tablet 0   famotidine (PEPCID) 20 MG tablet TAKE 2 TABLETS TWICE DAILY 120 tablet 5   levothyroxine (SYNTHROID) 75 MCG tablet Take 1 tablet (75 mcg total) by mouth daily before breakfast. 90 tablet 3   lidocaine (XYLOCAINE) 2 % solution Use as directed 15 mLs in the mouth or throat as needed for mouth pain. 100 mL 0   LORazepam (ATIVAN) 1 MG tablet TAKE 1 TABLET AT BEDTIME AS NEEDED FOR SLEEP 90 tablet 0   losartan (COZAAR) 50 MG tablet Take 1 tablet (50 mg total) by mouth daily. 90 tablet 3   nortriptyline (PAMELOR) 75 MG capsule TAKE 1 CAPSULE AT BEDTIME 90 capsule 1   traZODone (DESYREL) 50 MG tablet TAKE 1/2 TO 1 TABLET (25-50 MG TOTAL) BY MOUTH AT BEDTIME AS NEEDED FOR SLEEP. 90 tablet 1   No current facility-administered  medications on file prior to visit.    Review of Systems  Constitutional:  Negative for activity change, appetite change, fatigue, fever and unexpected weight change.  HENT:  Negative for congestion, ear pain, rhinorrhea, sinus pressure and sore throat.   Eyes:  Negative for pain, redness and visual disturbance.  Respiratory:  Negative for cough, shortness of breath and wheezing.   Cardiovascular:  Negative for chest pain and palpitations.  Gastrointestinal:  Negative for abdominal pain, blood in stool, constipation and diarrhea.  Endocrine: Negative for polydipsia and polyuria.  Genitourinary:  Negative for dysuria, frequency and urgency.  Musculoskeletal:  Positive for arthralgias and gait problem. Negative for back pain, joint swelling and myalgias.  Skin:  Negative for pallor and rash.  Allergic/Immunologic: Negative for environmental allergies.  Neurological:  Negative for dizziness, syncope and headaches.       Headache is better  Hematological:  Negative for adenopathy. Does not bruise/bleed easily.  Psychiatric/Behavioral:  Positive for sleep disturbance. Negative for decreased concentration and dysphoric mood. The patient is nervous/anxious.        Objective:   Physical Exam Constitutional:      General: She is not in acute distress.    Appearance: Normal appearance. She is well-developed and normal weight. She is not ill-appearing or diaphoretic.  HENT:     Head: Normocephalic and atraumatic.  Eyes:     Conjunctiva/sclera: Conjunctivae normal.     Pupils: Pupils are equal, round, and reactive to light.  Neck:     Thyroid: No thyromegaly.     Vascular: No carotid bruit or JVD.  Cardiovascular:     Rate and Rhythm: Normal rate and regular rhythm.     Heart sounds: Normal heart sounds.     No gallop.  Pulmonary:     Effort: Pulmonary effort is normal. No respiratory distress.     Breath sounds: Normal breath sounds. No wheezing or rales.  Abdominal:  General: There  is no distension or abdominal bruit.     Palpations: Abdomen is soft.     Tenderness: There is no abdominal tenderness.  Musculoskeletal:     Cervical back: Normal range of motion and neck supple.     Right lower leg: No edema.     Left lower leg: No edema.     Comments: Kyphosis   Oa changes feet and hands   Lymphadenopathy:     Cervical: No cervical adenopathy.  Skin:    General: Skin is warm and dry.     Coloration: Skin is not pale.     Findings: No rash.  Neurological:     Mental Status: She is alert.     Cranial Nerves: No cranial nerve deficit.     Motor: No weakness.     Coordination: Coordination normal.     Deep Tendon Reflexes: Reflexes are normal and symmetric. Reflexes normal.  Psychiatric:        Mood and Affect: Mood normal.           Assessment & Plan:   Problem List Items Addressed This Visit       Cardiovascular and Mediastinum   Essential hypertension - Primary    bp in fair control at this time -improved  BP Readings from Last 1 Encounters:  11/16/22 128/70  No changes needed Most recent labs reviewed  Disc lifstyle change with low sodium diet and exercise  Plan to continue  Losartan 50 mg dialy  Cholrthalidone 15 mg daily (last labs were ok) Amlodipine 5 mg daily  Headache is improving-she will continue to monitor this Inst to stop checking bp at home unless she gets a new cuff           Other   Anxiety    Doing better with addn of buspar 7.5 mg bid Tolerates well Reviewed stressors/ coping techniques/symptoms/ support sources/ tx options and side effects in detail today  Continues nortriptyline, trazodone Lorazepam at night  Sleep disorder is still severe       Chronic foot pain    Pt asks for handicapped parking form  She can only walk short distances due to OA from old injuries       Frontal headache    Much improved with better bp control      Low serum vitamin B12    B12 shot given today

## 2022-11-16 NOTE — Assessment & Plan Note (Signed)
bp in fair control at this time -improved  BP Readings from Last 1 Encounters:  11/16/22 128/70   No changes needed Most recent labs reviewed  Disc lifstyle change with low sodium diet and exercise  Plan to continue  Losartan 50 mg dialy  Cholrthalidone 15 mg daily (last labs were ok) Amlodipine 5 mg daily  Headache is improving-she will continue to monitor this Inst to stop checking bp at home unless she gets a new cuff

## 2022-11-23 ENCOUNTER — Other Ambulatory Visit: Payer: Self-pay | Admitting: *Deleted

## 2022-11-23 MED ORDER — FAMOTIDINE 20 MG PO TABS: 40.0000 mg | ORAL_TABLET | Freq: Two times a day (BID) | ORAL | 1 refills | Status: DC

## 2022-12-21 ENCOUNTER — Ambulatory Visit (INDEPENDENT_AMBULATORY_CARE_PROVIDER_SITE_OTHER): Payer: Medicare HMO | Admitting: *Deleted

## 2022-12-21 DIAGNOSIS — E538 Deficiency of other specified B group vitamins: Secondary | ICD-10-CM | POA: Diagnosis not present

## 2022-12-21 MED ORDER — CYANOCOBALAMIN 1000 MCG/ML IJ SOLN
1000.0000 ug | Freq: Once | INTRAMUSCULAR | Status: AC
  Administered 2022-12-21: 1000 ug via INTRAMUSCULAR

## 2022-12-21 NOTE — Progress Notes (Signed)
Per orders of Dr. Tower, injection of Vitamin B-12 given by Marilu Rylander. Patient tolerated injection well. 

## 2022-12-29 ENCOUNTER — Other Ambulatory Visit: Payer: Self-pay | Admitting: Family Medicine

## 2022-12-31 NOTE — Telephone Encounter (Signed)
Prolia VOB initiated via [email or web address] 

## 2023-01-01 ENCOUNTER — Other Ambulatory Visit: Payer: Self-pay | Admitting: Family Medicine

## 2023-01-03 NOTE — Telephone Encounter (Signed)
Name of Medication: Ativan Name of Pharmacy: [facility] Mail order Last Fill or Written Date and Quantity: 09/30/22 #30 tabs 1 refills Last Office Visit and Type:11/16/22 f/u Next Office Visit and Type:04/25/23 CPE

## 2023-01-05 ENCOUNTER — Other Ambulatory Visit (HOSPITAL_COMMUNITY): Payer: Self-pay

## 2023-01-05 ENCOUNTER — Other Ambulatory Visit: Payer: Self-pay | Admitting: Family Medicine

## 2023-01-05 NOTE — Telephone Encounter (Signed)
Pharmacy Patient Advocate Encounter   Received notification that prior authorization for PROLIA 60MG  is required/requested.   PA submitted on 01/05/23 to (ins) [employer] via CoverMyMeds Key or (Medicaid) confirmation # [id number] Status is pending

## 2023-01-17 ENCOUNTER — Other Ambulatory Visit (HOSPITAL_COMMUNITY): Payer: Self-pay

## 2023-01-17 NOTE — Telephone Encounter (Signed)
I do not work at [employer], not sure who is handling this now.

## 2023-01-17 NOTE — Telephone Encounter (Signed)
Patient Advocate Encounter  Prior Authorization for Prolia 60MG /ML syringes has been approved with [employer].    PA# [id number] Effective dates: 01/02/2019 through 09/06/23  Per [facility] test claim, copay for 180 days supply is $95

## 2023-01-17 NOTE — Telephone Encounter (Addendum)
Pt ready for scheduling for Prolia on or after : 01/31/23  Out-of-pocket cost due at time of visit: $327  Primary: [employer] Prolia co-insurance: 20% Admin fee co-insurance: 20%  Secondary: --- Prolia co-insurance:  Admin fee co-insurance:   Medical Benefit Details: Date Benefits were checked: 12/31/22 Deductible: NO/ Coinsurance: 20%/ Admin Fee: 20%  Prior Auth: APPROVED PA# [id number]  Expiration Date: 09/06/2023   Pharmacy benefit: Copay $95 If patient wants fill through the pharmacy benefit please send prescription to: [facility], and include estimated need by date in rx notes. Pharmacy will ship medication directly to the office.  Patient NOT eligible for Prolia Copay Card. Copay Card can make patient's cost as little as $25. Link to apply: [email or web address]://[email or web address]  ** This summary of benefits is an estimation of the patient's out-of-pocket cost. Exact cost may very based on individual plan coverage.

## 2023-01-25 ENCOUNTER — Ambulatory Visit (INDEPENDENT_AMBULATORY_CARE_PROVIDER_SITE_OTHER): Payer: Medicare HMO | Admitting: *Deleted

## 2023-01-25 ENCOUNTER — Other Ambulatory Visit: Payer: Self-pay

## 2023-01-25 DIAGNOSIS — E538 Deficiency of other specified B group vitamins: Secondary | ICD-10-CM | POA: Diagnosis not present

## 2023-01-25 DIAGNOSIS — M8000XS Age-related osteoporosis with current pathological fracture, unspecified site, sequela: Secondary | ICD-10-CM

## 2023-01-25 DIAGNOSIS — M810 Age-related osteoporosis without current pathological fracture: Secondary | ICD-10-CM

## 2023-01-25 MED ORDER — DENOSUMAB 60 MG/ML ~~LOC~~ SOSY: 60.0000 mg | PREFILLED_SYRINGE | Freq: Once | SUBCUTANEOUS | 0 refills | Status: AC

## 2023-01-25 MED ORDER — CYANOCOBALAMIN 1000 MCG/ML IJ SOLN
1000.0000 ug | Freq: Once | INTRAMUSCULAR | Status: AC
  Administered 2023-01-25: 1000 ug via INTRAMUSCULAR

## 2023-01-25 NOTE — Progress Notes (Signed)
Per orders of Dr. Tower, injection of Vitamin B-12 given by Berthold Glace. Patient tolerated injection well. 

## 2023-01-25 NOTE — Telephone Encounter (Signed)
Called patient reviewed all following information including appointment, Co pay due at time of visit and if pick up of injection is needed from outside pharmacy.     Out of pocket for patient: order from pharmacy $95   Lab appointment : 02/01/23 at 2 pm   Nurse visit:  02/08/23 at 9am   Lab order placed: Yes  Prolia has been  []   Ordered  []   Script sent to local pharmacy for patient to bring   [x]   Script sent to [facility]  [employer] mail order   []   Script sent to [facility] to deliver

## 2023-01-25 NOTE — Addendum Note (Signed)
Addended by: Alvina Chou on: 01/25/2023 02:12 PM   Modules accepted: Orders

## 2023-01-29 ENCOUNTER — Other Ambulatory Visit (HOSPITAL_COMMUNITY): Payer: Self-pay

## 2023-02-01 ENCOUNTER — Other Ambulatory Visit: Payer: Medicare HMO

## 2023-02-01 ENCOUNTER — Telehealth: Payer: Self-pay | Admitting: Family Medicine

## 2023-02-01 NOTE — Telephone Encounter (Signed)
[facility] pharmacy called requesting consent for prolia meds delivery. Pharmacy asked for a call back @ [phone]

## 2023-02-02 NOTE — Telephone Encounter (Signed)
Will not be able to ship until day of injection.

## 2023-02-07 ENCOUNTER — Telehealth: Payer: Self-pay

## 2023-02-07 NOTE — Telephone Encounter (Signed)
Pt scheduled labs for prolia on 02/14/23 at 2:30 and NV scheduled on 02/22/23 at 2pm for nurse visit.pt can only cvomer on mon and tues.

## 2023-02-07 NOTE — Telephone Encounter (Signed)
Patient did not come in for labs. Will need to call and set up new lab app and nurse visit after.

## 2023-02-08 ENCOUNTER — Ambulatory Visit: Payer: Medicare HMO

## 2023-02-14 ENCOUNTER — Other Ambulatory Visit (INDEPENDENT_AMBULATORY_CARE_PROVIDER_SITE_OTHER): Payer: Medicare HMO

## 2023-02-14 DIAGNOSIS — M810 Age-related osteoporosis without current pathological fracture: Secondary | ICD-10-CM

## 2023-02-15 ENCOUNTER — Ambulatory Visit: Payer: Medicare HMO

## 2023-02-15 LAB — BASIC METABOLIC PANEL
BUN: 22 mg/dL (ref 6–23)
CO2: 27 mEq/L (ref 19–32)
Calcium: 8.7 mg/dL (ref 8.4–10.5)
Chloride: 105 mEq/L (ref 96–112)
Creatinine, Ser: 1.34 mg/dL — ABNORMAL HIGH (ref 0.40–1.20)
GFR: 38.27 mL/min — ABNORMAL LOW (ref >60.00)
Glucose, Bld: 103 mg/dL — ABNORMAL HIGH (ref 70–99)
Potassium: 4 mEq/L (ref 3.5–5.1)
Sodium: 139 mEq/L (ref 135–145)

## 2023-02-16 NOTE — Telephone Encounter (Signed)
Hold prolia for now please  Have her f/u with me for visit (will do UA and repeat labs that day also) Not sure why her renal status changed   Encourage fluid intake and I will see her then

## 2023-02-16 NOTE — Telephone Encounter (Signed)
Received patient labs for Prolia.   27.19 mL/min is your estimated creatinine clearance. Calcium 8.7.  Ok to continue with Prolia?

## 2023-02-17 NOTE — Telephone Encounter (Signed)
Great, thanks

## 2023-02-17 NOTE — Telephone Encounter (Signed)
Called patient she can only come in on Monday or Tuesday. Set up for office visit on 6/17. She will increase water intake and let us know if any symptoms before appointment.

## 2023-02-17 NOTE — Telephone Encounter (Signed)
Appt appears already scheduled.

## 2023-02-21 ENCOUNTER — Encounter: Payer: Self-pay | Admitting: Family Medicine

## 2023-02-21 ENCOUNTER — Ambulatory Visit (INDEPENDENT_AMBULATORY_CARE_PROVIDER_SITE_OTHER): Payer: Medicare HMO | Admitting: Family Medicine

## 2023-02-21 VITALS — BP 122/58 | HR 77 | Temp 98.0°F | Ht 62.5 in | Wt 105.0 lb

## 2023-02-21 DIAGNOSIS — R944 Abnormal results of kidney function studies: Secondary | ICD-10-CM

## 2023-02-21 DIAGNOSIS — N3000 Acute cystitis without hematuria: Secondary | ICD-10-CM | POA: Diagnosis not present

## 2023-02-21 DIAGNOSIS — I10 Essential (primary) hypertension: Secondary | ICD-10-CM | POA: Diagnosis not present

## 2023-02-21 DIAGNOSIS — M8000XS Age-related osteoporosis with current pathological fracture, unspecified site, sequela: Secondary | ICD-10-CM | POA: Diagnosis not present

## 2023-02-21 DIAGNOSIS — R339 Retention of urine, unspecified: Secondary | ICD-10-CM | POA: Diagnosis not present

## 2023-02-21 DIAGNOSIS — N390 Urinary tract infection, site not specified: Secondary | ICD-10-CM | POA: Insufficient documentation

## 2023-02-21 LAB — POC URINALSYSI DIPSTICK (AUTOMATED)
Bilirubin, UA: NEGATIVE
Blood, UA: POSITIVE
Glucose, UA: NEGATIVE
Ketones, UA: NEGATIVE
Nitrite, UA: NEGATIVE
Protein, UA: NEGATIVE
Spec Grav, UA: 1.015 (ref 1.010–1.025)
Urobilinogen, UA: 0.2 E.U./dL
pH, UA: 6 (ref 5.0–8.0)

## 2023-02-21 MED ORDER — CEPHALEXIN 500 MG PO CAPS: 500.0000 mg | ORAL_CAPSULE | Freq: Two times a day (BID) | ORAL | 0 refills | Status: DC

## 2023-02-21 NOTE — Assessment & Plan Note (Signed)
Last labs for prolia noted reduced GFR to 38.27  Holding prolia while this is worked up Drinking fluids Treating possible uti   Then re check labs and hope to re start prolia  Want the gap in therapy to be as short as possible

## 2023-02-21 NOTE — Patient Instructions (Signed)
Keep working on fluid intake  Goal is 60 oz per day if possible   Also take keflex for uti  The urine culture will return in 1-2 days and we will call with results  Then make changes if needed   Then we will make a plan to re check labs for kidney function   Then will make a plan for prolia

## 2023-02-21 NOTE — Assessment & Plan Note (Signed)
bp in fair control at this time  BP Readings from Last 1 Encounters:  02/21/23 (!) 122/58   No changes needed Most recent labs reviewed  Disc lifstyle change with low sodium diet and exercise  Chlorthalidone 15 mg daily  Losartan 50 mg daily  Amlodipine 5 mg daily   Some concern for dehydratoin/ reduced GFR and uti  Will treat and re check chem lab

## 2023-02-21 NOTE — Assessment & Plan Note (Signed)
GFR is down to 38.2 from over 60  Pt has some frequency / malaise and fatigue Ua notes leuks today  Suspect uti  Also dehydration prior to labs (drinking more now)  Instructed to try and get 60 oz of fluids daily /mostly water Keflex for uti- start that  Pending culture   Once treated and hydrated will re check chem labs to see if she can re start prolia

## 2023-02-21 NOTE — Assessment & Plan Note (Signed)
Positive ua for 2 plus leuk Frequency and fatigue/malaise   Culture pending  Treat with keflex in the meantime Instructed to call if symptoms worsen before culture returns   Long discussed re: increase water intake as well

## 2023-02-21 NOTE — Progress Notes (Signed)
Subjective:    Patient ID: [name] [name], female    DOB: May 22, 1946, 77 y.o.   MRN: [id number]  HPI Pt presents for follow up of osteoporosis and also decreased GFR and urinary symptoms  Wt Readings from Last 3 Encounters:  02/21/23 105 lb (47.6 kg)  11/16/22 108 lb 6 oz (49.2 kg)  08/17/22 109 lb (49.4 kg)   18.90 kg/m  Vitals:   02/21/23 1406  BP: (!) 122/58  Pulse: 77  Temp: 98 F (36.7 C)  SpO2: 97%    Pt had labs for prolia recently and renal labs were off CMP     Component Value Date/Time   NA 139 02/14/2023 1416   NA 138 12/13/2013 1904   K 4.0 02/14/2023 1416   K 3.6 12/13/2013 1904   CL 105 02/14/2023 1416   CL 104 12/13/2013 1904   CO2 27 02/14/2023 1416   CO2 29 12/13/2013 1904   GLUCOSE 103 (H) 02/14/2023 1416   GLUCOSE 136 (H) 12/13/2013 1904   BUN 22 02/14/2023 1416   BUN 12 12/13/2013 1904   CREATININE 1.34 (H) 02/14/2023 1416   CREATININE 0.72 12/13/2013 1904   CALCIUM 8.7 02/14/2023 1416   CALCIUM 8.6 12/13/2013 1904   PROT 6.4 04/12/2022 1223   ALBUMIN 4.3 04/12/2022 1223   AST 22 04/12/2022 1223   ALT 15 04/12/2022 1223   ALKPHOS 39 04/12/2022 1223   BILITOT 0.4 04/12/2022 1223   GFR 38.27 (L) 02/14/2023 1416   GFRNONAA >60 05/18/2022 1451   GFRNONAA >60 12/13/2013 1904   Significant dec in GFR Did not order prolia but instead told her to follow up  Instructed to increase fluid intake   Frequent urination more than usual for a month   She sips fluid through the day Better since her labs came back   Prior to that not drinking much   Feels tired more lately  Wears out fast   Eating regularly   Is outdoors  Does not think she has been overheated / does not do yard work in the heat    HTN bp is stable today  No cp or palpitations or headaches or edema  No side effects to medicines  BP Readings from Last 3 Encounters:  02/21/23 (!) 122/58  11/16/22 128/70  11/15/22 (!) 160/87     Takes chlorthalidone 15 mg  daily Losartan 50 mg daily Amlodipine 5 mg daily   Takes ibuprofen over the counter once in a while May 1 pill weekly   Ua :  Results for orders placed or performed in visit on 02/21/23  POCT Urinalysis Dipstick (Automated)  Result Value Ref Range   Color, UA yellow    Clarity, UA clear    Glucose, UA Negative Negative   Bilirubin, UA neg    Ketones, UA neg    Spec Grav, UA 1.015 1.010 - 1.025   Blood, UA pos    pH, UA 6.0 5.0 - 8.0   Protein, UA Negative Negative   Urobilinogen, UA 0.2 0.2 or 1.0 E.U./dL   Nitrite, UA neg    Leukocytes, UA Moderate (2+) (A) Negative      Patient Active Problem List   Diagnosis Date Noted   UTI (urinary tract infection) 02/21/2023   Frontal headache 05/31/2022   Nausea 04/19/2022   Decreased GFR 02/02/2022   GERD (gastroesophageal reflux disease) 02/02/2022   Microscopic hematuria 02/02/2022   Urinary frequency 04/14/2021   Low serum vitamin B12 08/07/2020   Prediabetes  03/07/2020   Tingling in extremities 03/07/2020   Neck pain 11/05/2019   Ganglion cyst of tendon sheath of right hand 05/17/2019   Medicare annual wellness visit, subsequent 10/16/2018   Benign neoplasm of ascending colon    Benign neoplasm of transverse colon    Melanosis, colon    Other specified diseases of esophagus    Stomach irritation    Constipation 01/24/2018   Compression fracture of L1 vertebra (HCC) 01/24/2018   Anxiety 01/09/2018   Coronary artery calcification seen on CAT scan 11/25/2017   Special screening for malignant neoplasms, colon 11/01/2017   Estrogen deficiency 09/26/2017   Aortic calcification (HCC) 11/30/2016   History of patellar fracture 11/30/2016   Routine general medical examination at a health care facility 06/27/2016   Loss of weight 06/04/2016   History of vertebral compression fracture 01/19/2016   Esophageal dysphagia 06/28/2014   Chronic foot pain 10/27/2011   Low back pain 04/14/2011   FEVER BLISTER 11/17/2010    Essential hypertension 08/18/2009   BUNION 03/04/2009   Allergic rhinitis 01/10/2008   H/O goiter 02/09/2007   Hypothyroidism 02/09/2007   HYPERCHOLESTEROLEMIA 02/09/2007   History of depression 02/09/2007   OSTEOARTHRITIS 02/09/2007   Osteoporosis 02/09/2007   Disturbance in sleep behavior 02/09/2007   Past Medical History:  Diagnosis Date   Aortic calcification (HCC)    COPD (chronic obstructive pulmonary disease) (HCC)    Depression    Hyperlipidemia    Hypertension    Hypothyroidism    Osteoarthritis    Osteoporosis    Shingles    arm 1/12   Sleep disorder    Tobacco abuse    past; quit 09   Past Surgical History:  Procedure Laterality Date   ABDOMINAL HYSTERECTOMY     BIOPSY THYROID     pos neoplasm, surgery 09/2006   bunions  06/1998   CATARACT EXTRACTION W/PHACO Left 04/20/2022   Procedure: CATARACT EXTRACTION PHACO AND INTRAOCULAR LENS PLACEMENT (IOC) LEFT;  Surgeon: Galen Manila, MD;  Location: [facility];  Service: Ophthalmology;  Laterality: Left;  10.24 0058.5   CATARACT EXTRACTION W/PHACO Right 05/04/2022   Procedure: CATARACT EXTRACTION PHACO AND INTRAOCULAR LENS PLACEMENT (IOC) RIGHT;  Surgeon: Galen Manila, MD;  Location: [facility];  Service: Ophthalmology;  Laterality: Right;  6.94 00:43.2   COLONOSCOPY WITH PROPOFOL N/A 03/15/2018   Procedure: COLONOSCOPY WITH PROPOFOL;  Surgeon: Pasty Spillers, MD;  Location: [facility];  Service: Endoscopy;  Laterality: N/A;   CORONARY ANGIOPLASTY     CXR-copd, ts partial compression fracture  12/2006   dexa  12/1996   ESOPHAGOGASTRODUODENOSCOPY (EGD) WITH PROPOFOL N/A 03/15/2018   Procedure: ESOPHAGOGASTRODUODENOSCOPY (EGD) WITH PROPOFOL;  Surgeon: Pasty Spillers, MD;  Location: [facility];  Service: Endoscopy;  Laterality: N/A;   GANGLION CYST EXCISION Right 05/16/2019   Procedure: REMOVAL GANGLION OF WRIST;  Surgeon: Christena Flake, MD;  Location: [facility];  Service:  Orthopedics;  Laterality: Right;   hashimoto  02/1997   hyerectomy- cervical ca cells     LEFT HEART CATH AND CORONARY ANGIOGRAPHY Left 12/23/2017   Procedure: LEFT HEART CATH AND CORONARY ANGIOGRAPHY;  Surgeon: Antonieta Iba, MD;  Location: [facility];  Service: Cardiovascular;  Laterality: Left;   left wrist fracture     surgery   right thyroid lobectomy, goiter, thyroiditis  12/2006   stress fractures foot     THYROIDECTOMY  11/2009   Social History   Tobacco Use   Smoking status: Former  Packs/day: .5    Types: Cigarettes    Quit date: 08/07/2019    Years since quitting: 3.5   Smokeless tobacco: Never  Vaping Use   Vaping Use: Never used  Substance Use Topics   Alcohol use: No    Alcohol/week: 0.0 standard drinks of alcohol   Drug use: No   Family History  Problem Relation Age of Onset   Stroke Father    Hypertension Father    Diabetes Mother    Coronary artery disease Mother    Allergies  Allergen Reactions   Fosamax [Alendronate Sodium]     Dysphagia and heartburn    Raloxifene Hcl Other (See Comments)    Leg pain and cramps  Leg pain and cramps    Current Outpatient Medications on File Prior to Visit  Medication Sig Dispense Refill   albuterol (VENTOLIN HFA) 108 (90 Base) MCG/ACT inhaler Inhale 2 puffs into the lungs every 4 (four) hours as needed for wheezing or shortness of breath.     amLODipine (NORVASC) 5 MG tablet Take 1 tablet (5 mg total) by mouth daily. 90 tablet 1   atorvastatin (LIPITOR) 80 MG tablet TAKE 1 TABLET EVERY DAY 90 tablet 1   busPIRone (BUSPAR) 15 MG tablet Take 0.5 tablets (7.5 mg total) by mouth 2 (two) times daily. 90 tablet 3   Calcium Carbonate-Vit D-Min (CALCIUM 1200 PO) Take 1 tablet by mouth daily.      chlorthalidone (HYGROTEN) 15 MG tablet Take 1 tablet (15 mg total) by mouth daily. 90 tablet 0   cyclobenzaprine (FLEXERIL) 10 MG tablet TAKE 1 TABLET AT BEDTIME AS NEEDED 90 tablet 1   ezetimibe (ZETIA) 10 MG tablet  TAKE 1 TABLET EVERY DAY 90 tablet 0   famotidine (PEPCID) 20 MG tablet Take 2 tablets (40 mg total) by mouth 2 (two) times daily. 360 tablet 1   levothyroxine (SYNTHROID) 75 MCG tablet Take 1 tablet (75 mcg total) by mouth daily before breakfast. 90 tablet 3   lidocaine (XYLOCAINE) 2 % solution Use as directed 15 mLs in the mouth or throat as needed for mouth pain. 100 mL 0   LORazepam (ATIVAN) 1 MG tablet TAKE 1 TABLET AT BEDTIME AS NEEDED FOR SLEEP 30 tablet 1   losartan (COZAAR) 50 MG tablet Take 1 tablet (50 mg total) by mouth daily. 90 tablet 3   nortriptyline (PAMELOR) 75 MG capsule TAKE 1 CAPSULE AT BEDTIME 90 capsule 1   traZODone (DESYREL) 50 MG tablet TAKE 1/2 TO 1 TABLET (25-50 MG TOTAL) BY MOUTH AT BEDTIME AS NEEDED FOR SLEEP. 90 tablet 1   No current facility-administered medications on file prior to visit.    Review of Systems  Constitutional:  Positive for fatigue. Negative for activity change, appetite change, chills, fever and unexpected weight change.       Some malaise No fever   HENT:  Negative for congestion, ear pain, rhinorrhea, sinus pressure and sore throat.   Eyes:  Negative for pain, redness and visual disturbance.  Respiratory:  Negative for cough, shortness of breath and wheezing.   Cardiovascular:  Negative for chest pain and palpitations.  Gastrointestinal:  Negative for abdominal pain, blood in stool, constipation, diarrhea and nausea.  Endocrine: Negative for polydipsia and polyuria.  Genitourinary:  Positive for frequency and urgency. Negative for decreased urine volume, dysuria and hematuria.  Musculoskeletal:  Negative for arthralgias, back pain and myalgias.  Skin:  Negative for pallor and rash.  Allergic/Immunologic: Negative for environmental allergies.  Neurological:  Negative for dizziness, syncope and headaches.  Hematological:  Negative for adenopathy. Does not bruise/bleed easily.  Psychiatric/Behavioral:  Negative for decreased concentration  and dysphoric mood. The patient is not nervous/anxious.        Objective:   Physical Exam Constitutional:      General: She is not in acute distress.    Appearance: Normal appearance. She is well-developed. She is not ill-appearing or diaphoretic.     Comments: Frail appearing  Underweight  Baseline   HENT:     Head: Normocephalic and atraumatic.     Mouth/Throat:     Mouth: Mucous membranes are moist.  Eyes:     Conjunctiva/sclera: Conjunctivae normal.     Pupils: Pupils are equal, round, and reactive to light.  Cardiovascular:     Rate and Rhythm: Normal rate and regular rhythm.     Heart sounds: Normal heart sounds.  Pulmonary:     Effort: Pulmonary effort is normal.     Breath sounds: Normal breath sounds.  Abdominal:     General: Bowel sounds are normal. There is no distension.     Palpations: Abdomen is soft.     Tenderness: There is abdominal tenderness. There is no rebound.     Comments: Some very mild R CVA tenderness  Mild suprapubic tenderness  Musculoskeletal:     Cervical back: Normal range of motion and neck supple.     Comments: Mod to severe kyphosis   Lymphadenopathy:     Cervical: No cervical adenopathy.  Skin:    Coloration: Skin is not pale.     Findings: No rash.  Neurological:     Mental Status: She is alert.  Psychiatric:        Mood and Affect: Mood normal.           Assessment & Plan:   Problem List Items Addressed This Visit       Cardiovascular and Mediastinum   Essential hypertension    bp in fair control at this time  BP Readings from Last 1 Encounters:  02/21/23 (!) 122/58  No changes needed Most recent labs reviewed  Disc lifstyle change with low sodium diet and exercise  Chlorthalidone 15 mg daily  Losartan 50 mg daily  Amlodipine 5 mg daily   Some concern for dehydratoin/ reduced GFR and uti  Will treat and re check chem lab           Musculoskeletal and Integument   Osteoporosis    Last labs for prolia  noted reduced GFR to 38.27  Holding prolia while this is worked up Drinking fluids Treating possible uti   Then re check labs and hope to re start prolia  Want the gap in therapy to be as short as possible        Genitourinary   UTI (urinary tract infection)    Positive ua for 2 plus leuk Frequency and fatigue/malaise   Culture pending  Treat with keflex in the meantime Instructed to call if symptoms worsen before culture returns   Long discussed re: increase water intake as well      Relevant Medications   cephALEXin (KEFLEX) 500 MG capsule   Other Relevant Orders   Urine Culture     Other   Decreased GFR - Primary    GFR is down to 38.2 from over 60  Pt has some frequency / malaise and fatigue Ua notes leuks today  Suspect uti  Also dehydration prior to labs (drinking more now)  Instructed to try and get 60 oz of fluids daily /mostly water Keflex for uti- start that  Pending culture   Once treated and hydrated will re check chem labs to see if she can re start prolia       Relevant Orders   POCT Urinalysis Dipstick (Automated) (Completed)   Urine Culture

## 2023-02-22 ENCOUNTER — Ambulatory Visit: Payer: Medicare HMO

## 2023-02-22 ENCOUNTER — Ambulatory Visit (INDEPENDENT_AMBULATORY_CARE_PROVIDER_SITE_OTHER): Payer: Medicare HMO

## 2023-02-22 VITALS — Ht 65.0 in | Wt 105.0 lb

## 2023-02-22 DIAGNOSIS — Z0000 Encounter for general adult medical examination without abnormal findings: Secondary | ICD-10-CM

## 2023-02-22 LAB — URINE CULTURE
MICRO NUMBER:: 15090811
SPECIMEN QUALITY:: ADEQUATE

## 2023-02-22 NOTE — Progress Notes (Signed)
Subjective:   [name] [name] is a 77 y.o. female who presents for Medicare Annual (Subsequent) preventive examination.  I connected with  Aneesha L [name] on 02/22/23 by a audio enabled telemedicine application and verified that I am speaking with the correct person using two identifiers.  Patient Location: Home  Provider Location: Office/Clinic  I discussed the limitations of evaluation and management by telemedicine. The patient expressed understanding and agreed to proceed.    Review of Systems      Cardiac Risk Factors include: advanced age (>74men, >74 women);hypertension;sedentary lifestyle     Objective:    Today's Vitals   02/22/23 0822  Weight: 105 lb (47.6 kg)  Height: 5\' 5"  (1.651 m)   Body mass index is 17.47 kg/m.     02/22/2023    8:33 AM 05/18/2022    2:43 PM 05/04/2022    8:48 AM 04/20/2022    9:15 AM 02/17/2022    9:31 AM 05/25/2021   10:36 AM 02/16/2021    9:02 AM  Advanced Directives  Does Patient Have a Medical Advance Directive? No No No No No No No  Would patient like information on creating a medical advance directive? No - Patient declined No - Patient declined No - Patient declined No - Patient declined No - Patient declined No - Patient declined No - Patient declined    Current Medications (verified) Outpatient Encounter Medications as of 02/22/2023  Medication Sig   albuterol (VENTOLIN HFA) 108 (90 Base) MCG/ACT inhaler Inhale 2 puffs into the lungs every 4 (four) hours as needed for wheezing or shortness of breath.   amLODipine (NORVASC) 5 MG tablet Take 1 tablet (5 mg total) by mouth daily.   atorvastatin (LIPITOR) 80 MG tablet TAKE 1 TABLET EVERY DAY   busPIRone (BUSPAR) 15 MG tablet Take 0.5 tablets (7.5 mg total) by mouth 2 (two) times daily.   Calcium Carbonate-Vit D-Min (CALCIUM 1200 PO) Take 1 tablet by mouth daily.    cephALEXin (KEFLEX) 500 MG capsule Take 1 capsule (500 mg total) by mouth 2 (two) times daily.   chlorthalidone  (HYGROTEN) 15 MG tablet Take 1 tablet (15 mg total) by mouth daily.   cyclobenzaprine (FLEXERIL) 10 MG tablet TAKE 1 TABLET AT BEDTIME AS NEEDED   ezetimibe (ZETIA) 10 MG tablet TAKE 1 TABLET EVERY DAY   famotidine (PEPCID) 20 MG tablet Take 2 tablets (40 mg total) by mouth 2 (two) times daily.   levothyroxine (SYNTHROID) 75 MCG tablet Take 1 tablet (75 mcg total) by mouth daily before breakfast.   LORazepam (ATIVAN) 1 MG tablet TAKE 1 TABLET AT BEDTIME AS NEEDED FOR SLEEP   losartan (COZAAR) 50 MG tablet Take 1 tablet (50 mg total) by mouth daily.   nortriptyline (PAMELOR) 75 MG capsule TAKE 1 CAPSULE AT BEDTIME   traZODone (DESYREL) 50 MG tablet TAKE 1/2 TO 1 TABLET (25-50 MG TOTAL) BY MOUTH AT BEDTIME AS NEEDED FOR SLEEP.   lidocaine (XYLOCAINE) 2 % solution Use as directed 15 mLs in the mouth or throat as needed for mouth pain. (Patient not taking: Reported on 02/22/2023)   No facility-administered encounter medications on file as of 02/22/2023.    Allergies (verified) Fosamax [alendronate sodium] and Raloxifene hcl   History: Past Medical History:  Diagnosis Date   Aortic calcification (HCC)    COPD (chronic obstructive pulmonary disease) (HCC)    Depression    Hyperlipidemia    Hypertension    Hypothyroidism    Osteoarthritis  Osteoporosis    Shingles    arm 1/12   Sleep disorder    Tobacco abuse    past; quit 09   Past Surgical History:  Procedure Laterality Date   ABDOMINAL HYSTERECTOMY     BIOPSY THYROID     pos neoplasm, surgery 09/2006   bunions  06/1998   CATARACT EXTRACTION W/PHACO Left 04/20/2022   Procedure: CATARACT EXTRACTION PHACO AND INTRAOCULAR LENS PLACEMENT (IOC) LEFT;  Surgeon: Galen Manila, MD;  Location: [facility];  Service: Ophthalmology;  Laterality: Left;  10.24 0058.5   CATARACT EXTRACTION W/PHACO Right 05/04/2022   Procedure: CATARACT EXTRACTION PHACO AND INTRAOCULAR LENS PLACEMENT (IOC) RIGHT;  Surgeon: Galen Manila, MD;   Location: [facility];  Service: Ophthalmology;  Laterality: Right;  6.94 00:43.2   COLONOSCOPY WITH PROPOFOL N/A 03/15/2018   Procedure: COLONOSCOPY WITH PROPOFOL;  Surgeon: Pasty Spillers, MD;  Location: [facility];  Service: Endoscopy;  Laterality: N/A;   CORONARY ANGIOPLASTY     CXR-copd, ts partial compression fracture  12/2006   dexa  12/1996   ESOPHAGOGASTRODUODENOSCOPY (EGD) WITH PROPOFOL N/A 03/15/2018   Procedure: ESOPHAGOGASTRODUODENOSCOPY (EGD) WITH PROPOFOL;  Surgeon: Pasty Spillers, MD;  Location: [facility];  Service: Endoscopy;  Laterality: N/A;   GANGLION CYST EXCISION Right 05/16/2019   Procedure: REMOVAL GANGLION OF WRIST;  Surgeon: Christena Flake, MD;  Location: [facility];  Service: Orthopedics;  Laterality: Right;   hashimoto  02/1997   hyerectomy- cervical ca cells     LEFT HEART CATH AND CORONARY ANGIOGRAPHY Left 12/23/2017   Procedure: LEFT HEART CATH AND CORONARY ANGIOGRAPHY;  Surgeon: Antonieta Iba, MD;  Location: [facility];  Service: Cardiovascular;  Laterality: Left;   left wrist fracture     surgery   right thyroid lobectomy, goiter, thyroiditis  12/2006   stress fractures foot     THYROIDECTOMY  11/2009   Family History  Problem Relation Age of Onset   Stroke Father    Hypertension Father    Diabetes Mother    Coronary artery disease Mother    Social History   Socioeconomic History   Marital status: Widowed    Spouse name: Not on file   Number of children: Not on file   Years of education: Not on file   Highest education level: Not on file  Occupational History   Not on file  Tobacco Use   Smoking status: Former    Packs/day: .5    Types: Cigarettes    Quit date: 08/07/2019    Years since quitting: 3.5   Smokeless tobacco: Never  Vaping Use   Vaping Use: Never used  Substance and Sexual Activity   Alcohol use: No    Alcohol/week: 0.0 standard drinks of alcohol   Drug use: No   Sexual activity: Not on file   Other Topics Concern   Not on file  Social History Narrative   Marital Status: widowed.    3 children   Lost husband to throat cancer in 8/16 .    Social Determinants of Health   Financial Resource Strain: Low Risk  (02/22/2023)   Overall Financial Resource Strain (CARDIA)    Difficulty of Paying Living Expenses: Not hard at all  Food Insecurity: No Food Insecurity (02/22/2023)   Hunger Vital Sign    Worried About Running Out of Food in the Last Year: Never true    Ran Out of Food in the Last Year: Never true  Transportation Needs: No Transportation  Needs (02/22/2023)   PRAPARE - Administrator, Civil Service (Medical): No    Lack of Transportation (Non-Medical): No  Physical Activity: Inactive (02/22/2023)   Exercise Vital Sign    Days of Exercise per Week: 0 days    Minutes of Exercise per Session: 0 min  Stress: Stress Concern Present (02/22/2023)   [employer] of Occupational Health - Occupational Stress Questionnaire    Feeling of Stress : Rather much  Social Connections: Moderately Isolated (02/22/2023)   Social Connection and Isolation Panel [NHANES]    Frequency of Communication with Friends and Family: More than three times a week    Frequency of Social Gatherings with Friends and Family: More than three times a week    Attends Religious Services: More than 4 times per year    Active Member of [employer] or Organizations: No    Attends Banker Meetings: Never    Marital Status: Widowed    Tobacco Counseling Counseling given: Not Answered   Clinical Intake:  Pre-visit preparation completed: Yes  Pain : No/denies pain     Nutritional Risks: None Diabetes: No  How often do you need to have someone help you when you read instructions, pamphlets, or other written materials from your doctor or pharmacy?: 1 - Never  Interpreter Needed?: No  Information entered by :: C.Chanell Nadeau LPN   Activities of Daily Living    02/22/2023    8:34 AM  05/04/2022    8:41 AM  In your present state of health, do you have any difficulty performing the following activities:  Hearing? 0 0  Vision? 0 0  Difficulty concentrating or making decisions? 0 0  Walking or climbing stairs? 0 0  Dressing or bathing? 0 0  Doing errands, shopping? 0   Preparing Food and eating ? N   Using the Toilet? N   In the past six months, have you accidently leaked urine? Y   Comment Currently due to UTI   Do you have problems with loss of bowel control? N   Managing your Medications? N   Managing your Finances? N   Housekeeping or managing your Housekeeping? N     Patient Care Team: Tower, Audrie Gallus, MD as PCP - General Lew Dawes Sheria Lang, OD as Consulting Physician (Optometry) Stratton, Garry Heater, [facility] as Pharmacist (Pharmacist)  Indicate any recent Medical Services you may have received from other than [facility] providers in the past year (date may be approximate).     Assessment:   This is a routine wellness examination for Caylen.  Hearing/Vision screen Hearing Screening - Comments:: No issues Vision Screening - Comments:: Glasses-[facility], has issues with left eye.  Dietary issues and exercise activities discussed:     Goals Addressed             This Visit's Progress    Patient Stated       No new goals       Depression Screen    02/22/2023    8:31 AM 11/16/2022    9:20 AM 04/19/2022    2:05 PM 02/17/2022    9:24 AM 04/07/2021   12:20 PM 02/16/2021    9:03 AM 12/12/2019   12:00 PM  PHQ 2/9 Scores  PHQ - 2 Score 0 0 0 0 0 6 1  PHQ- 9 Score  5  2  12 1     Fall Risk    02/22/2023    8:34 AM 02/21/2023    2:06 PM  11/16/2022    9:20 AM 04/19/2022    2:05 PM 02/17/2022    9:31 AM  Fall Risk   Falls in the past year? 0 0 0 0 0  Number falls in past yr: 0 0 0  0  Injury with Fall? 0 0 0  0  Risk for fall due to : No Fall Risks No Fall Risks No Fall Risks  No Fall Risks  Follow up Falls prevention discussed;Falls evaluation completed Falls  evaluation completed Falls evaluation completed Falls evaluation completed Falls evaluation completed    MEDICARE RISK AT HOME:       Cognitive Function:    02/16/2021    9:05 AM 12/12/2019   12:02 PM 09/22/2017   11:19 AM 07/02/2016   10:08 AM  MMSE - Mini Mental State Exam  Orientation to time 5 5 5 5   Orientation to Place 5 5 5 5   Registration 3 3 3 3   Attention/ Calculation 5 5 0 0  Recall 3 3 3 3   Language- name 2 objects   0 0  Language- repeat 1 1 1 1   Language- follow 3 step command   2 0  Language- follow 3 step command-comments   unable to follow 1 step of 3 step command pt was unable to follow 3 steps of 3 step command  Language- read & follow direction   0 0  Write a sentence   0 0  Copy design   0 0  Total score   19 17        02/22/2023    8:36 AM 02/17/2022    9:33 AM  6CIT Screen  What Year? 4 points 0 points  What month? 0 points 0 points  What time? 3 points 0 points  Count back from 20 0 points 0 points  Months in reverse 0 points 0 points  Repeat phrase 4 points 2 points  Total Score 11 points 2 points    Immunizations Immunization History  Administered Date(s) Administered   Fluad Quad(high Dose 65+) 08/07/2020, 08/11/2021, 07/05/2022   Influenza Split 05/24/2012   Influenza Whole 06/16/2007   Influenza,inj,Quad PF,6+ Mos 07/04/2013, 06/28/2014, 07/25/2015, 06/04/2016, 08/31/2017, 10/16/2018   Pneumococcal Conjugate-13 08/08/2015   Pneumococcal Polysaccharide-23 11/10/2007, 06/28/2014   Td 11/10/2007    TDAP status: Due, Education has been provided regarding the importance of this vaccine. Advised may receive this vaccine at local pharmacy or Health Dept. Aware to provide a copy of the vaccination record if obtained from local pharmacy or Health Dept. Verbalized acceptance and understanding.  Flu Vaccine status: Up to date  Pneumococcal vaccine status: Up to date  Covid-19 vaccine status: Information provided on how to obtain vaccines.    Qualifies for Shingles Vaccine? Yes   Zostavax completed No   Shingrix Completed?: No.    Education has been provided regarding the importance of this vaccine. Patient has been advised to call insurance company to determine out of pocket expense if they have not yet received this vaccine. Advised may also receive vaccine at local pharmacy or Health Dept. Verbalized acceptance and understanding.  Screening Tests Health Maintenance  Topic Date Due   DTaP/Tdap/Td (2 - Tdap) 11/09/2017   MAMMOGRAM  04/20/2023 (Originally 09/12/1963)   Zoster Vaccines- Shingrix (1 of 2) 05/24/2023 (Originally 09/11/1964)   COVID-19 Vaccine (1) 04/30/2025 (Originally 09/11/1950)   INFLUENZA VACCINE  04/07/2023   Medicare Annual Wellness (AWV)  02/22/2024   Pneumonia Vaccine 44+ Years old  Completed   DEXA SCAN  Completed   Hepatitis C Screening  Completed   HPV VACCINES  Aged Out   Colonoscopy  Discontinued    Health Maintenance  Health Maintenance Due  Topic Date Due   DTaP/Tdap/Td (2 - Tdap) 11/09/2017    Colorectal cancer screening: No longer required.   Mammogram status: No longer required due to age.  Bone Density status: Completed 04/13/22. Results reflect: Bone density results: OSTEOPOROSIS. Repeat every 2 years.  Lung Cancer Screening: (Low Dose CT Chest recommended if Age 57-80 years, 20 pack-year currently smoking OR have quit w/in 15years.) does not qualify.   Lung Cancer Screening Referral: no  Additional Screening:  Hepatitis C Screening: does qualify; Completed 06/22/16  Vision Screening: Recommended annual ophthalmology exams for early detection of glaucoma and other disorders of the eye. Is the patient up to date with their annual eye exam?  Yes  Who is the provider or what is the name of the office in which the patient attends annual eye exams? [facility] If pt is not established with a provider, would they like to be referred to a provider to establish care? Yes .   Dental  Screening: Recommended annual dental exams for proper oral hygiene  Community Resource Referral / Chronic Care Management: CRR required this visit?  No   CCM required this visit?  No     Plan:     I have personally reviewed and noted the following in the patient's chart:   Medical and social history Use of alcohol, tobacco or illicit drugs  Current medications and supplements including opioid prescriptions. Patient is not currently taking opioid prescriptions. Functional ability and status Nutritional status Physical activity Advanced directives List of other physicians Hospitalizations, surgeries, and ER visits in previous 12 months Vitals Screenings to include cognitive, depression, and falls Referrals and appointments  In addition, I have reviewed and discussed with patient certain preventive protocols, quality metrics, and best practice recommendations. A written personalized care plan for preventive services as well as general preventive health recommendations were provided to patient.     Maryan Puls, LPN   1/61/0960   After Visit Summary: (Declined) Due to this being a telephonic visit, with patients personalized plan was offered to patient but patient Declined AVS at this time   Nurse Notes: NONE

## 2023-02-22 NOTE — Patient Instructions (Signed)
[name] [name] , Thank you for taking time to come for your Medicare Wellness Visit. I appreciate your ongoing commitment to your health goals. Please review the following plan we discussed and let me know if I can assist you in the future.   These are the goals we discussed:  Goals      DIET - EAT MORE FRUITS AND VEGETABLES     Increase physical activity     Starting 07/02/2016, I will continue to walk at least 30 min daily.      Increase physical activity     Starting 09/22/2017, I will continue to do stretching and core exercises for 5 minutes daily and to resume walking when weather permits.      Patient Stated     12/12/2019, I will maintain and continue medications as prescribed.      Patient Stated     02/16/2021, I will continue to do my knee exercises daily for 30 minutes.      Patient Stated     No new goals     Track and Manage My Blood Pressure-Hypertension     Timeframe:  Long-Range Goal Priority:  High Start Date:     08/24/21                        Expected End Date:   08/24/22                    Follow Up Date Aug 2023   -Obtain a BP monitor for home use (preferably with arm cuff, not wrist) - check blood pressure weekly - choose a place to take my blood pressure (home, clinic or office, retail store) - write blood pressure results in a log or diary    Why is this important?   You won't feel high blood pressure, but it can still hurt your blood vessels.  High blood pressure can cause heart or kidney problems. It can also cause a stroke.  Making lifestyle changes like losing a little weight or eating less salt will help.  Checking your blood pressure at home and at different times of the day can help to control blood pressure.  If the doctor prescribes medicine remember to take it the way the doctor ordered.  Call the office if you cannot afford the medicine or if there are questions about it.     Notes:         This is a list of the screening recommended for you  and due dates:  Health Maintenance  Topic Date Due   DTaP/Tdap/Td vaccine (2 - Tdap) 11/09/2017   Mammogram  04/20/2023*   Zoster (Shingles) Vaccine (1 of 2) 05/24/2023*   COVID-19 Vaccine (1) 04/30/2025*   Flu Shot  04/07/2023   Medicare Annual Wellness Visit  02/22/2024   Pneumonia Vaccine  Completed   DEXA scan (bone density measurement)  Completed   Hepatitis C Screening  Completed   HPV Vaccine  Aged Out   Colon Cancer Screening  Discontinued  *Topic was postponed. The date shown is not the original due date.    Advanced directives: none  Conditions/risks identified: Aim for 30 minutes of exercise or brisk walking, 6-8 glasses of water, and 5 servings of fruits and vegetables each day.   Next appointment: Follow up in one year for your annual wellness visit 02/23/24 @ 8:15 Telephone   Preventive Care 65 Years and Older, Female Preventive care refers to lifestyle  choices and visits with your health care provider that can promote health and wellness. What does preventive care include? A yearly physical exam. This is also called an annual well check. Dental exams once or twice a year. Routine eye exams. Ask your health care provider how often you should have your eyes checked. Personal lifestyle choices, including: Daily care of your teeth and gums. Regular physical activity. Eating a healthy diet. Avoiding tobacco and drug use. Limiting alcohol use. Practicing safe sex. Taking low-dose aspirin every day. Taking vitamin and mineral supplements as recommended by your health care provider. What happens during an annual well check? The services and screenings done by your health care provider during your annual well check will depend on your age, overall health, lifestyle risk factors, and family history of disease. Counseling  Your health care provider may ask you questions about your: Alcohol use. Tobacco use. Drug use. Emotional well-being. Home and relationship  well-being. Sexual activity. Eating habits. History of falls. Memory and ability to understand (cognition). Work and work Astronomer. Reproductive health. Screening  You may have the following tests or measurements: Height, weight, and BMI. Blood pressure. Lipid and cholesterol levels. These may be checked every 5 years, or more frequently if you are over 8 years old. Skin check. Lung cancer screening. You may have this screening every year starting at age 66 if you have a 30-pack-year history of smoking and currently smoke or have quit within the past 15 years. Fecal occult blood test (FOBT) of the stool. You may have this test every year starting at age 41. Flexible sigmoidoscopy or colonoscopy. You may have a sigmoidoscopy every 5 years or a colonoscopy every 10 years starting at age 56. Hepatitis C blood test. Hepatitis B blood test. Sexually transmitted disease (STD) testing. Diabetes screening. This is done by checking your blood sugar (glucose) after you have not eaten for a while (fasting). You may have this done every 1-3 years. Bone density scan. This is done to screen for osteoporosis. You may have this done starting at age 84. Mammogram. This may be done every 1-2 years. Talk to your health care provider about how often you should have regular mammograms. Talk with your health care provider about your test results, treatment options, and if necessary, the need for more tests. Vaccines  Your health care provider may recommend certain vaccines, such as: Influenza vaccine. This is recommended every year. Tetanus, diphtheria, and acellular pertussis (Tdap, Td) vaccine. You may need a Td booster every 10 years. Zoster vaccine. You may need this after age 31. Pneumococcal 13-valent conjugate (PCV13) vaccine. One dose is recommended after age 72. Pneumococcal polysaccharide (PPSV23) vaccine. One dose is recommended after age 61. Talk to your health care provider about which  screenings and vaccines you need and how often you need them. This information is not intended to replace advice given to you by your health care provider. Make sure you discuss any questions you have with your health care provider. Document Released: 09/19/2015 Document Revised: 05/12/2016 Document Reviewed: 06/24/2015 Elsevier Interactive Patient Education  2017 [employer].  Fall Prevention in the Home Falls can cause injuries. They can happen to people of all ages. There are many things you can do to make your home safe and to help prevent falls. What can I do on the outside of my home? Regularly fix the edges of walkways and driveways and fix any cracks. Remove anything that might make you trip as you walk through a door,  such as a raised step or threshold. Trim any bushes or trees on the path to your home. Use bright outdoor lighting. Clear any walking paths of anything that might make someone trip, such as rocks or tools. Regularly check to see if handrails are loose or broken. Make sure that both sides of any steps have handrails. Any raised decks and porches should have guardrails on the edges. Have any leaves, snow, or ice cleared regularly. Use sand or salt on walking paths during winter. Clean up any spills in your garage right away. This includes oil or grease spills. What can I do in the bathroom? Use night lights. Install grab bars by the toilet and in the tub and shower. Do not use towel bars as grab bars. Use non-skid mats or decals in the tub or shower. If you need to sit down in the shower, use a plastic, non-slip stool. Keep the floor dry. Clean up any water that spills on the floor as soon as it happens. Remove soap buildup in the tub or shower regularly. Attach bath mats securely with double-sided non-slip rug tape. Do not have throw rugs and other things on the floor that can make you trip. What can I do in the bedroom? Use night lights. Make sure that you have a  light by your bed that is easy to reach. Do not use any sheets or blankets that are too big for your bed. They should not hang down onto the floor. Have a firm chair that has side arms. You can use this for support while you get dressed. Do not have throw rugs and other things on the floor that can make you trip. What can I do in the kitchen? Clean up any spills right away. Avoid walking on wet floors. Keep items that you use a lot in easy-to-reach places. If you need to reach something above you, use a strong step stool that has a grab bar. Keep electrical cords out of the way. Do not use floor polish or wax that makes floors slippery. If you must use wax, use non-skid floor wax. Do not have throw rugs and other things on the floor that can make you trip. What can I do with my stairs? Do not leave any items on the stairs. Make sure that there are handrails on both sides of the stairs and use them. Fix handrails that are broken or loose. Make sure that handrails are as long as the stairways. Check any carpeting to make sure that it is firmly attached to the stairs. Fix any carpet that is loose or worn. Avoid having throw rugs at the top or bottom of the stairs. If you do have throw rugs, attach them to the floor with carpet tape. Make sure that you have a light switch at the top of the stairs and the bottom of the stairs. If you do not have them, ask someone to add them for you. What else can I do to help prevent falls? Wear shoes that: Do not have high heels. Have rubber bottoms. Are comfortable and fit you well. Are closed at the toe. Do not wear sandals. If you use a stepladder: Make sure that it is fully opened. Do not climb a closed stepladder. Make sure that both sides of the stepladder are locked into place. Ask someone to hold it for you, if possible. Clearly mark and make sure that you can see: Any grab bars or handrails. First and last steps. Where the  edge of each step  is. Use tools that help you move around (mobility aids) if they are needed. These include: Canes. Walkers. Scooters. Crutches. Turn on the lights when you go into a dark area. Replace any light bulbs as soon as they burn out. Set up your furniture so you have a clear path. Avoid moving your furniture around. If any of your floors are uneven, fix them. If there are any pets around you, be aware of where they are. Review your medicines with your doctor. Some medicines can make you feel dizzy. This can increase your chance of falling. Ask your doctor what other things that you can do to help prevent falls. This information is not intended to replace advice given to you by your health care provider. Make sure you discuss any questions you have with your health care provider. Document Released: 06/19/2009 Document Revised: 01/29/2016 Document Reviewed: 09/27/2014 Elsevier Interactive Patient Education  2017 [employer].

## 2023-02-24 DIAGNOSIS — R339 Retention of urine, unspecified: Secondary | ICD-10-CM | POA: Insufficient documentation

## 2023-02-24 NOTE — Addendum Note (Signed)
Addended by: Roxy Manns A on: 02/24/2023 05:20 PM   Modules accepted: Orders

## 2023-03-01 ENCOUNTER — Ambulatory Visit: Payer: Medicare HMO

## 2023-03-06 ENCOUNTER — Telehealth: Payer: Self-pay | Admitting: Family Medicine

## 2023-03-06 DIAGNOSIS — I10 Essential (primary) hypertension: Secondary | ICD-10-CM

## 2023-03-06 DIAGNOSIS — R944 Abnormal results of kidney function studies: Secondary | ICD-10-CM

## 2023-03-06 NOTE — Telephone Encounter (Signed)
-----   Message from Alvina Chou sent at 03/01/2023 11:09 AM EDT ----- Regarding: Lab orders for Monday, 7.1.24 Lab orders, thanks

## 2023-03-07 ENCOUNTER — Other Ambulatory Visit: Payer: Medicare HMO

## 2023-03-07 ENCOUNTER — Other Ambulatory Visit: Payer: Self-pay | Admitting: Family Medicine

## 2023-03-09 NOTE — Telephone Encounter (Signed)
Name of Medication: Ativan Name of Pharmacy: [facility]) Mail order Last Fill or Written Date and Quantity: 09/30/22 #30 tabs 1 refills Last Office Visit and Type:02/21/23 f/u Next Office Visit and Type:04/25/23 CPE  Amlodipine last filled on 08/06/22 #90 tab/ 1 refill  Flexeril last filled on 06/25/22 #90 tab/ 1 refill  Synthroid last filled on 04/19/22 #90 tab/ 3 refill  Trazodone last filled on 06/25/22 #90 tabs/ 1 refill

## 2023-03-14 DIAGNOSIS — H52212 Irregular astigmatism, left eye: Secondary | ICD-10-CM | POA: Diagnosis not present

## 2023-03-14 DIAGNOSIS — Z961 Presence of intraocular lens: Secondary | ICD-10-CM | POA: Diagnosis not present

## 2023-03-14 DIAGNOSIS — H353221 Exudative age-related macular degeneration, left eye, with active choroidal neovascularization: Secondary | ICD-10-CM | POA: Diagnosis not present

## 2023-03-28 ENCOUNTER — Ambulatory Visit: Payer: Medicare HMO | Admitting: Urology

## 2023-03-28 ENCOUNTER — Encounter: Payer: Self-pay | Admitting: Urology

## 2023-03-28 VITALS — BP 130/68 | HR 74 | Ht 65.0 in | Wt 105.0 lb

## 2023-03-28 DIAGNOSIS — R350 Frequency of micturition: Secondary | ICD-10-CM

## 2023-03-28 DIAGNOSIS — N368 Other specified disorders of urethra: Secondary | ICD-10-CM | POA: Diagnosis not present

## 2023-03-28 DIAGNOSIS — R944 Abnormal results of kidney function studies: Secondary | ICD-10-CM

## 2023-03-28 DIAGNOSIS — R339 Retention of urine, unspecified: Secondary | ICD-10-CM

## 2023-03-28 LAB — BLADDER SCAN AMB NON-IMAGING: Scan Result: 172

## 2023-03-28 NOTE — Progress Notes (Signed)
I, [name] [name],acting as a scribe for [name] Altes, MD.,have documented all relevant documentation on the behalf of [name] Altes, MD,as directed by  [name] Altes, MD while in the presence of [name] Altes, MD.  03/28/2023 11:09 AM   [name] [name] [name] [name] [birth year] [id number]  Referring provider: Judy Pimple, MD [address] [place],  [place] [address]  Chief Complaint  Patient presents with   Urinary Retention    HPI: [name] [name] is a 77 y.o. female referred for evaluation of urinary retention.  PCP visit 02/21/23 complaining of urinary frequency, urgency, voiding small amounts with sensation of incomplete emptying. UA showed 2+ leukocytes on dipstick, however urine culture showed no significant growth.  She was empirically treated with Keflex and has not seen any change in her symptoms.  Creatinine elevated above baseline at 1.34 (previous 0.74); GFR decreased from 78-38. Last week she did note blood on the toilet paper when wiping after urination.   PMH: Past Medical History:  Diagnosis Date   Aortic calcification (HCC)    COPD (chronic obstructive pulmonary disease) (HCC)    Depression    Hyperlipidemia    Hypertension    Hypothyroidism    Osteoarthritis    Osteoporosis    Shingles    arm 1/12   Sleep disorder    Tobacco abuse    past; quit 09    Surgical History: Past Surgical History:  Procedure Laterality Date   ABDOMINAL HYSTERECTOMY     BIOPSY THYROID     pos neoplasm, surgery 09/2006   bunions  06/1998   CATARACT EXTRACTION W/PHACO Left 04/20/2022   Procedure: CATARACT EXTRACTION PHACO AND INTRAOCULAR LENS PLACEMENT (IOC) LEFT;  Surgeon: [name] Manila, MD;  Location: [facility];  Service: Ophthalmology;  Laterality: Left;  10.24 0058.5   CATARACT EXTRACTION W/PHACO Right 05/04/2022   Procedure: CATARACT EXTRACTION PHACO AND INTRAOCULAR LENS PLACEMENT (IOC) RIGHT;  Surgeon: [name] Manila, MD;  Location: [facility];  Service: Ophthalmology;  Laterality: Right;  6.94 00:43.2   COLONOSCOPY WITH PROPOFOL N/A 03/15/2018   Procedure: COLONOSCOPY WITH PROPOFOL;  Surgeon: [name] Spillers, MD;  Location: [facility];  Service: Endoscopy;  Laterality: N/A;   CORONARY ANGIOPLASTY     CXR-copd, ts partial compression fracture  12/2006   dexa  12/1996   ESOPHAGOGASTRODUODENOSCOPY (EGD) WITH PROPOFOL N/A 03/15/2018   Procedure: ESOPHAGOGASTRODUODENOSCOPY (EGD) WITH PROPOFOL;  Surgeon: [name] Spillers, MD;  Location: [facility] ENDOS[facility];  Service: Endoscopy;  Laterality: N/A;   GANGLION CYST EXCISION Right 05/16/2019   Procedure: REMOVAL GANGLION OF WRIST;  Surgeon: [name] Flake, MD;  Location: [facility] ORS;  Service: Orthopedics;  Laterality: Right;   hashimoto  02/1997   hyerectomy- cervical ca cells     LEFT HEART CATH AND CORONARY ANGIOGRAPHY Left 12/23/2017   Procedure: LEFT HEART CATH AND CORONARY ANGIOGRAPHY;  Surgeon: [name] Iba, MD;  Location: [facility] IN[facility];  Service: Cardiovascular;  Laterality: Left;   left wrist fracture     surgery   right thyroid lobectomy, goiter, thyroiditis  12/2006   stress fractures foot     THYROIDECTOMY  11/2009    Home Medications:  Allergies as of 03/28/2023       Reactions   Fosamax [alendronate Sodium]    Dysphagia and heartburn    Raloxifene Hcl Other (See Comments)   Leg pain and cramps  Leg pain and cramps  Medication List        Accurate as of March 28, 2023 11:09 AM. If you have any questions, ask your nurse or doctor.          albuterol 108 (90 Base) MCG/ACT inhaler Commonly known as: VENTOLIN HFA Inhale 2 puffs into the lungs every 4 (four) hours as needed for wheezing or shortness of breath.   amLODipine 5 MG tablet Commonly known as: NORVASC TAKE 1 TABLET EVERY DAY   atorvastatin 80 MG tablet Commonly known as: LIPITOR TAKE 1 TABLET EVERY DAY   busPIRone 15 MG tablet Commonly known as: BUSPAR Take 0.5 tablets (7.5  mg total) by mouth 2 (two) times daily.   CALCIUM 1200 PO Take 1 tablet by mouth daily.   cephALEXin 500 MG capsule Commonly known as: KEFLEX Take 1 capsule (500 mg total) by mouth 2 (two) times daily.   chlorthalidone 15 MG tablet Commonly known as: THALITONE Take 1 tablet (15 mg total) by mouth daily.   cyclobenzaprine 10 MG tablet Commonly known as: FLEXERIL TAKE 1 TABLET AT BEDTIME AS NEEDED   ezetimibe 10 MG tablet Commonly known as: ZETIA TAKE 1 TABLET EVERY DAY   famotidine 20 MG tablet Commonly known as: PEPCID Take 2 tablets (40 mg total) by mouth 2 (two) times daily.   levothyroxine 75 MCG tablet Commonly known as: SYNTHROID TAKE 1 TABLET EVERY DAY BEFORE BREAKFAST   lidocaine 2 % solution Commonly known as: XYLOCAINE Use as directed 15 mLs in the mouth or throat as needed for mouth pain.   LORazepam 1 MG tablet Commonly known as: ATIVAN TAKE 1 TABLET AT BEDTIME AS NEEDED FOR SLEEP   losartan 50 MG tablet Commonly known as: COZAAR Take 1 tablet (50 mg total) by mouth daily.   nortriptyline 75 MG capsule Commonly known as: PAMELOR TAKE 1 CAPSULE AT BEDTIME   traZODone 50 MG tablet Commonly known as: DESYREL TAKE 1/2 TO 1 TABLET (25 TO 50MG  TOTAL) AT BEDTIME AS NEEDED FOR SLEEP.        Allergies:  Allergies  Allergen Reactions   Fosamax [Alendronate Sodium]     Dysphagia and heartburn    Raloxifene Hcl Other (See Comments)    Leg pain and cramps  Leg pain and cramps     Family History: Family History  Problem Relation Age of Onset   Stroke Father    Hypertension Father    Diabetes Mother    Coronary artery disease Mother     Social History:  reports that she quit smoking about 3 years ago. Her smoking use included cigarettes. She has never used smokeless tobacco. She reports that she does not drink alcohol and does not use drugs.   Physical Exam: BP 130/68   Pulse 74   Ht 5\' 5"  (1.651 m)   Wt 105 lb (47.6 kg)   BMI 17.47 kg/m    Constitutional:  Alert and oriented, No acute distress. HEENT: [place] AT Respiratory: Normal respiratory effort, no increased work of breathing. Psychiatric: Normal mood and affect.   Urinalysis Dipstick trace blood/trace leukocytes, microscopy 6-10 WBC    Assessment & Plan:    1. Urinary frequency/urgency UA with mild pyuria; urine culture ordered.  PVR 172 mL   2. Blood per urethra Noted blood on tissue paper when wiping.  UA negative Scheduled cystoscopy.  3. Decreased GFR Schedule renal ultrasound.  I have reviewed the above documentation for accuracy and completeness, and I agree with the above.   Brookelle Pellicane C  Lonna Cobb, MD  [facility] [address], [address] [place], [place] [address] [phone]

## 2023-03-29 LAB — URINALYSIS, COMPLETE
Bilirubin, UA: NEGATIVE
Glucose, UA: NEGATIVE
Ketones, UA: NEGATIVE
Nitrite, UA: NEGATIVE
Protein,UA: NEGATIVE
Specific Gravity, UA: 1.01 (ref 1.005–1.030)
Urobilinogen, Ur: 0.2 mg/dL (ref 0.2–1.0)
pH, UA: 6 (ref 5.0–7.5)

## 2023-03-29 LAB — MICROSCOPIC EXAMINATION: Bacteria, UA: NONE SEEN

## 2023-03-30 LAB — CULTURE, URINE COMPREHENSIVE

## 2023-03-31 LAB — CULTURE, URINE COMPREHENSIVE

## 2023-04-01 ENCOUNTER — Other Ambulatory Visit: Payer: Self-pay | Admitting: *Deleted

## 2023-04-01 MED ORDER — CEFUROXIME AXETIL 250 MG PO TABS: 250.0000 mg | ORAL_TABLET | Freq: Two times a day (BID) | ORAL | 0 refills | Status: AC

## 2023-04-05 ENCOUNTER — Ambulatory Visit
Admission: RE | Admit: 2023-04-05 | Discharge: 2023-04-05 | Disposition: A | Discharge status: Home / Self Care | Payer: Medicare HMO | Source: Ambulatory Visit | Attending: Urology | Admitting: Urology

## 2023-04-05 DIAGNOSIS — N200 Calculus of kidney: Secondary | ICD-10-CM | POA: Diagnosis not present

## 2023-04-05 DIAGNOSIS — R339 Retention of urine, unspecified: Secondary | ICD-10-CM | POA: Diagnosis not present

## 2023-04-05 DIAGNOSIS — N132 Hydronephrosis with renal and ureteral calculous obstruction: Secondary | ICD-10-CM | POA: Diagnosis not present

## 2023-04-05 DIAGNOSIS — R319 Hematuria, unspecified: Secondary | ICD-10-CM | POA: Insufficient documentation

## 2023-04-13 ENCOUNTER — Telehealth: Payer: Self-pay | Admitting: Urology

## 2023-04-13 NOTE — Telephone Encounter (Signed)
Patient called to ask about results from [place] that was done on 04/05/23.

## 2023-04-14 NOTE — Telephone Encounter (Signed)
Patient has a appt scheduled for results and cysto

## 2023-04-18 ENCOUNTER — Other Ambulatory Visit: Payer: Medicare HMO

## 2023-04-25 ENCOUNTER — Encounter: Payer: Medicare HMO | Admitting: Family Medicine

## 2023-05-02 ENCOUNTER — Encounter: Payer: Medicare HMO | Admitting: Family Medicine

## 2023-05-02 ENCOUNTER — Other Ambulatory Visit (INDEPENDENT_AMBULATORY_CARE_PROVIDER_SITE_OTHER): Payer: Medicare HMO

## 2023-05-02 DIAGNOSIS — R944 Abnormal results of kidney function studies: Secondary | ICD-10-CM | POA: Diagnosis not present

## 2023-05-02 DIAGNOSIS — I10 Essential (primary) hypertension: Secondary | ICD-10-CM | POA: Diagnosis not present

## 2023-05-02 LAB — BASIC METABOLIC PANEL
BUN: 17 mg/dL (ref 6–23)
CO2: 31 mEq/L (ref 19–32)
Calcium: 9.3 mg/dL (ref 8.4–10.5)
Chloride: 103 mEq/L (ref 96–112)
Creatinine, Ser: 0.89 mg/dL (ref 0.40–1.20)
GFR: 62.44 mL/min (ref >60.00)
Glucose, Bld: 91 mg/dL (ref 70–99)
Potassium: 3.8 mEq/L (ref 3.5–5.1)
Sodium: 141 mEq/L (ref 135–145)

## 2023-05-04 ENCOUNTER — Other Ambulatory Visit: Payer: Self-pay | Admitting: Family Medicine

## 2023-05-04 ENCOUNTER — Other Ambulatory Visit: Payer: Medicare HMO | Admitting: Urology

## 2023-05-05 ENCOUNTER — Other Ambulatory Visit: Payer: Self-pay | Admitting: Family Medicine

## 2023-05-05 ENCOUNTER — Other Ambulatory Visit: Payer: Medicare HMO | Admitting: Urology

## 2023-05-10 ENCOUNTER — Ambulatory Visit (INDEPENDENT_AMBULATORY_CARE_PROVIDER_SITE_OTHER): Payer: Medicare HMO | Admitting: Family Medicine

## 2023-05-10 ENCOUNTER — Encounter: Payer: Self-pay | Admitting: Family Medicine

## 2023-05-10 VITALS — BP 131/75 | HR 73 | Temp 97.6°F | Ht 62.75 in | Wt 103.0 lb

## 2023-05-10 DIAGNOSIS — M8000XS Age-related osteoporosis with current pathological fracture, unspecified site, sequela: Secondary | ICD-10-CM | POA: Diagnosis not present

## 2023-05-10 DIAGNOSIS — Z0000 Encounter for general adult medical examination without abnormal findings: Secondary | ICD-10-CM | POA: Diagnosis not present

## 2023-05-10 DIAGNOSIS — F419 Anxiety disorder, unspecified: Secondary | ICD-10-CM

## 2023-05-10 DIAGNOSIS — E538 Deficiency of other specified B group vitamins: Secondary | ICD-10-CM

## 2023-05-10 DIAGNOSIS — E7800 Pure hypercholesterolemia, unspecified: Secondary | ICD-10-CM | POA: Diagnosis not present

## 2023-05-10 DIAGNOSIS — R7303 Prediabetes: Secondary | ICD-10-CM

## 2023-05-10 DIAGNOSIS — E890 Postprocedural hypothyroidism: Secondary | ICD-10-CM | POA: Diagnosis not present

## 2023-05-10 DIAGNOSIS — I10 Essential (primary) hypertension: Secondary | ICD-10-CM

## 2023-05-10 DIAGNOSIS — I700 Atherosclerosis of aorta: Secondary | ICD-10-CM

## 2023-05-10 DIAGNOSIS — Z23 Encounter for immunization: Secondary | ICD-10-CM

## 2023-05-10 LAB — CBC WITH DIFFERENTIAL/PLATELET
Basophils Absolute: 0 10*3/uL (ref 0.0–0.1)
Basophils Relative: 0.5 % (ref 0.0–3.0)
Eosinophils Absolute: 0.2 10*3/uL (ref 0.0–0.7)
Eosinophils Relative: 3.6 % (ref 0.0–5.0)
HCT: 41.3 % (ref 36.0–46.0)
Hemoglobin: 13.6 g/dL (ref 12.0–15.0)
Lymphocytes Relative: 37.1 % (ref 12.0–46.0)
Lymphs Abs: 2.1 10*3/uL (ref 0.7–4.0)
MCHC: 33 g/dL (ref 30.0–36.0)
MCV: 94.4 fl (ref 78.0–100.0)
Monocytes Absolute: 0.4 10*3/uL (ref 0.1–1.0)
Monocytes Relative: 7.2 % (ref 3.0–12.0)
Neutro Abs: 3 10*3/uL (ref 1.4–7.7)
Neutrophils Relative %: 51.6 % (ref 43.0–77.0)
Platelets: 283 10*3/uL (ref 150.0–400.0)
RBC: 4.37 Mil/uL (ref 3.87–5.11)
RDW: 14.9 % (ref 11.5–15.5)
WBC: 5.7 10*3/uL (ref 4.0–10.5)

## 2023-05-10 LAB — HEPATIC FUNCTION PANEL
ALT: 17 U/L (ref 0–35)
AST: 23 U/L (ref 0–37)
Albumin: 4.2 g/dL (ref 3.5–5.2)
Alkaline Phosphatase: 58 U/L (ref 39–117)
Bilirubin, Direct: 0.1 mg/dL (ref 0.0–0.3)
Total Bilirubin: 0.8 mg/dL (ref 0.2–1.2)
Total Protein: 6.7 g/dL (ref 6.0–8.3)

## 2023-05-10 LAB — HEMOGLOBIN A1C: Hgb A1c MFr Bld: 6 % (ref 4.6–6.5)

## 2023-05-10 LAB — LIPID PANEL
Cholesterol: 158 mg/dL (ref 0–200)
HDL: 63.4 mg/dL (ref >39.00)
LDL Cholesterol: 82 mg/dL (ref 0–99)
NonHDL: 94.7
Total CHOL/HDL Ratio: 2
Triglycerides: 64 mg/dL (ref 0.0–149.0)
VLDL: 12.8 mg/dL (ref 0.0–40.0)

## 2023-05-10 MED ORDER — CYANOCOBALAMIN 1000 MCG/ML IJ SOLN
1000.0000 ug | Freq: Once | INTRAMUSCULAR | Status: AC
Start: 2023-05-10 — End: 2023-05-10
  Administered 2023-05-10: 1000 ug via INTRAMUSCULAR

## 2023-05-10 NOTE — Assessment & Plan Note (Signed)
Due for prolia now that her GFR is back to normal  No falls or new fracture  Dexa 04/2022  Taking ca and D Walking Encouraged to add some strength building exercise

## 2023-05-10 NOTE — Assessment & Plan Note (Signed)
B12 level today  B12 shot today  No clinical changes

## 2023-05-10 NOTE — Assessment & Plan Note (Signed)
A1c drawn   Encouraged more protein calories to gain weight but not excess sugar Reviewed protein sources

## 2023-05-10 NOTE — Assessment & Plan Note (Signed)
Disc goals for lipids and reasons to control them Rev last labs with pt Rev low sat fat diet in detail   Lab today  Continues atorvastatin 80 mg daily and zetia 10 mg daily

## 2023-05-10 NOTE — Assessment & Plan Note (Signed)
Reviewed health habits including diet and exercise and skin cancer prevention Reviewed appropriate screening tests for age  Also reviewed health mt list, fam hx and immunization status , as well as social and family history   See HPI Labs reviewed and ordered Will inq about Td at pharmacy Flu shot tiven today  Declines shingrix Declines any breast cancer screening  Utd eye care  Colonoscopy utd 03/2018  Dexa utd 04/2022  PHQ 0

## 2023-05-10 NOTE — Assessment & Plan Note (Signed)
bp in fair control at this time  BP Readings from Last 1 Encounters:  05/10/23 131/75   No changes needed Most recent labs reviewed  Disc lifstyle change with low sodium diet and exercise  Chlorthalidone 15 mg daily  Losartan 50 mg daily  Amlodipine 5 mg daily   GFR is improved this draw

## 2023-05-10 NOTE — Progress Notes (Signed)
Subjective:    Patient ID: [name] [name], female    DOB: October 16, 1945, 77 y.o.   MRN: [id number]  HPI  Here for health maintenance exam and to review chronic medical problems   Wt Readings from Last 3 Encounters:  05/10/23 103 lb (46.7 kg)  03/28/23 105 lb (47.6 kg)  02/22/23 105 lb (47.6 kg)   18.39 kg/m  Has a hard time keeping weight on  Less appetite in the heat and with age     Vitals:   05/10/23 0858 05/10/23 0924  BP: (!) 148/72 131/75  Pulse: 73   Temp: 97.6 F (36.4 C)   SpO2: 95%     Immunization History  Administered Date(s) Administered   Fluad Quad(high Dose 65+) 08/07/2020, 08/11/2021, 07/05/2022   Fluad Trivalent(High Dose 65+) 05/10/2023   Influenza Split 05/24/2012   Influenza Whole 06/16/2007   Influenza,inj,Quad PF,6+ Mos 07/04/2013, 06/28/2014, 07/25/2015, 06/04/2016, 08/31/2017, 10/16/2018   Pneumococcal Conjugate-13 08/08/2015   Pneumococcal Polysaccharide-23 11/10/2007, 06/28/2014   Td 11/10/2007    Health Maintenance Due  Topic Date Due   DTaP/Tdap/Td (2 - Tdap) 11/09/2017   Tetanus shot due for [name] [name] inquure at [facility] Flu shot - today   Declines shingrix vaccine   Mammogram- declines  Declines breast exam  Self breast exam: no lumps   Gyn health-no problems   Sees oph Macular degeneration    Colon cancer screening -colonoscopy 03/2018   Bone health  Dexa 04/2022  osteoporosis slightly improved at forearm  On Prolia  Falls- none  Fractures- multiple in past incl vertebral/ no new fracture  Supplements ca and D  Exercise : walking in the mall    Mood    02/22/2023    8:31 AM 11/16/2022    9:20 AM 04/19/2022    2:05 PM 02/17/2022    9:24 AM 04/07/2021   12:20 PM  Depression screen PHQ 2/9  Decreased Interest 0 0 0 0 0  Down, Depressed, Hopeless 0 0 0 0 0  PHQ - 2 Score 0 0 0 0 0  Altered sleeping  0  1   Tired, decreased energy  3  1   Change in appetite  2  0   Feeling bad or failure about yourself   0  0   Trouble  concentrating  0  0   Moving slowly or fidgety/restless  0  0   Suicidal thoughts  0  0   PHQ-9 Score  5  2   Difficult doing work/chores  Not difficult at all  Not difficult at all    HTN bp is stable today  No cp or palpitations or headaches or edema  No side effects to medicines  BP Readings from Last 3 Encounters:  05/10/23 131/75  03/28/23 130/68  02/21/23 (!) 122/58    Chlorthalidone 15 mg daily  Losartan 50 mg daily  Amlodipine 5 mg daily   Lab Results  Component Value Date   NA 141 05/02/2023   K 3.8 05/02/2023   CO2 31 05/02/2023   GLUCOSE 91 05/02/2023   BUN 17 05/02/2023   CREATININE 0.89 05/02/2023   CALCIUM 9.3 05/02/2023   GFR 62.44 05/02/2023   GFRNONAA >60 05/18/2022  This improved after treatment for uti  Was instructed to drink more fluids also -working on that   Hypothyroidism  Pt has no clinical changes No change in energy level/ hair or skin/ edema and no tremor Lab Results  Component Value Date   TSH 2.81  04/12/2022    Due for labs  Takes levothyroxine 75 mcg daily    Prediabetes Lab Results  Component Value Date   HGBA1C 6.4 04/12/2022   Hyperlipidemia Lab Results  Component Value Date   CHOL 168 04/12/2022   HDL 68.90 04/12/2022   LDLCALC 89 04/12/2022   LDLDIRECT 140.9 07/04/2013   TRIG 52.0 04/12/2022   CHOLHDL 2 04/12/2022   Atorvastatin 80 mg daily  Zetia 10 mg daily   History of CAD  Gets B12 shot monthly  Lab Results  Component Value Date   VITAMINB12 444 04/12/2022     Patient Active Problem List   Diagnosis Date Noted   Urinary retention 02/24/2023   GERD (gastroesophageal reflux disease) 02/02/2022   Microscopic hematuria 02/02/2022   Low serum vitamin B12 08/07/2020   Prediabetes 03/07/2020   Tingling in extremities 03/07/2020   Neck pain 11/05/2019   Ganglion cyst of tendon sheath of right hand 05/17/2019   Medicare annual wellness visit, subsequent 10/16/2018   Melanosis, colon    Other specified  diseases of esophagus    Anxiety 01/09/2018   Coronary artery calcification seen on CAT scan 11/25/2017   Special screening for malignant neoplasms, colon 11/01/2017   Estrogen deficiency 09/26/2017   Aortic calcification (HCC) 11/30/2016   History of patellar fracture 11/30/2016   Routine general medical examination at a health care facility 06/27/2016   Loss of weight 06/04/2016   History of vertebral compression fracture 01/19/2016   Chronic foot pain 10/27/2011   Low back pain 04/14/2011   FEVER BLISTER 11/17/2010   Essential hypertension 08/18/2009   BUNION 03/04/2009   Allergic rhinitis 01/10/2008   H/O goiter 02/09/2007   Hypothyroidism 02/09/2007   HYPERCHOLESTEROLEMIA 02/09/2007   History of depression 02/09/2007   OSTEOARTHRITIS 02/09/2007   Osteoporosis 02/09/2007   Disturbance in sleep behavior 02/09/2007   Past Medical History:  Diagnosis Date   Aortic calcification (HCC)    COPD (chronic obstructive pulmonary disease) (HCC)    Depression    Hyperlipidemia    Hypertension    Hypothyroidism    Osteoarthritis    Osteoporosis    Shingles    arm 1/12   Sleep disorder    Tobacco abuse    past; quit 09   Past Surgical History:  Procedure Laterality Date   ABDOMINAL HYSTERECTOMY     BIOPSY THYROID     pos neoplasm, surgery 09/2006   bunions  06/1998   CATARACT EXTRACTION W/PHACO Left 04/20/2022   Procedure: CATARACT EXTRACTION PHACO AND INTRAOCULAR LENS PLACEMENT (IOC) LEFT;  Surgeon: Galen Manila, MD;  Location: [facility];  Service: Ophthalmology;  Laterality: Left;  10.24 0058.5   CATARACT EXTRACTION W/PHACO Right 05/04/2022   Procedure: CATARACT EXTRACTION PHACO AND INTRAOCULAR LENS PLACEMENT (IOC) RIGHT;  Surgeon: Galen Manila, MD;  Location: [facility];  Service: Ophthalmology;  Laterality: Right;  6.94 00:43.2   COLONOSCOPY WITH PROPOFOL N/A 03/15/2018   Procedure: COLONOSCOPY WITH PROPOFOL;  Surgeon: Pasty Spillers, MD;   Location: [facility];  Service: Endoscopy;  Laterality: N/A;   CORONARY ANGIOPLASTY     CXR-copd, ts partial compression fracture  12/2006   dexa  12/1996   ESOPHAGOGASTRODUODENOSCOPY (EGD) WITH PROPOFOL N/A 03/15/2018   Procedure: ESOPHAGOGASTRODUODENOSCOPY (EGD) WITH PROPOFOL;  Surgeon: Pasty Spillers, MD;  Location: [facility];  Service: Endoscopy;  Laterality: N/A;   GANGLION CYST EXCISION Right 05/16/2019   Procedure: REMOVAL GANGLION OF WRIST;  Surgeon: Christena Flake, MD;  Location: [facility];  Service: Orthopedics;  Laterality: Right;   hashimoto  02/1997   hyerectomy- cervical ca cells     LEFT HEART CATH AND CORONARY ANGIOGRAPHY Left 12/23/2017   Procedure: LEFT HEART CATH AND CORONARY ANGIOGRAPHY;  Surgeon: Antonieta Iba, MD;  Location: [facility];  Service: Cardiovascular;  Laterality: Left;   left wrist fracture     surgery   right thyroid lobectomy, goiter, thyroiditis  12/2006   stress fractures foot     THYROIDECTOMY  11/2009   Social History   Tobacco Use   Smoking status: Former    Current packs/day: 0.00    Types: Cigarettes    Quit date: 08/07/2019    Years since quitting: 3.7   Smokeless tobacco: Never  Vaping Use   Vaping status: Never Used  Substance Use Topics   Alcohol use: No    Alcohol/week: 0.0 standard drinks of alcohol   Drug use: No   Family History  Problem Relation Age of Onset   Stroke Father    Hypertension Father    Diabetes Mother    Coronary artery disease Mother    Allergies  Allergen Reactions   Fosamax [Alendronate Sodium]     Dysphagia and heartburn    Raloxifene Hcl Other (See Comments)    Leg pain and cramps  Leg pain and cramps    Current Outpatient Medications on File Prior to Visit  Medication Sig Dispense Refill   albuterol (VENTOLIN HFA) 108 (90 Base) MCG/ACT inhaler Inhale 2 puffs into the lungs every 4 (four) hours as needed for wheezing or shortness of breath.     amLODipine (NORVASC) 5 MG tablet  TAKE 1 TABLET EVERY DAY 90 tablet 1   atorvastatin (LIPITOR) 80 MG tablet TAKE 1 TABLET EVERY DAY 90 tablet 1   busPIRone (BUSPAR) 15 MG tablet Take 0.5 tablets (7.5 mg total) by mouth 2 (two) times daily. 90 tablet 3   Calcium Carbonate-Vit D-Min (CALCIUM 1200 PO) Take 1 tablet by mouth daily.      chlorthalidone (HYGROTEN) 15 MG tablet Take 1 tablet (15 mg total) by mouth daily. 90 tablet 0   cyanocobalamin (VITAMIN B12) 1000 MCG/ML injection Inject 1,000 mcg into the muscle every 30 (thirty) days.     cyclobenzaprine (FLEXERIL) 10 MG tablet TAKE 1 TABLET AT BEDTIME AS NEEDED 90 tablet 0   ezetimibe (ZETIA) 10 MG tablet TAKE 1 TABLET EVERY DAY 90 tablet 0   famotidine (PEPCID) 20 MG tablet Take 2 tablets (40 mg total) by mouth 2 (two) times daily. 360 tablet 1   levothyroxine (SYNTHROID) 75 MCG tablet TAKE 1 TABLET EVERY DAY BEFORE BREAKFAST 90 tablet 0   lidocaine (XYLOCAINE) 2 % solution Use as directed 15 mLs in the mouth or throat as needed for mouth pain. 100 mL 0   LORazepam (ATIVAN) 1 MG tablet TAKE 1 TABLET AT BEDTIME AS NEEDED FOR SLEEP 30 tablet 1   losartan (COZAAR) 50 MG tablet Take 1 tablet (50 mg total) by mouth daily. 90 tablet 3   nortriptyline (PAMELOR) 75 MG capsule TAKE 1 CAPSULE AT BEDTIME 90 capsule 1   PROLIA 60 MG/ML SOSY injection Inject 60 mg into the skin every 6 (six) months.     traZODone (DESYREL) 50 MG tablet TAKE 1/2 TO 1 TABLET (25 TO 50MG  TOTAL) AT BEDTIME AS NEEDED FOR SLEEP. 90 tablet 1   No current facility-administered medications on file prior to visit.    Review of Systems  Constitutional:  Positive for  fatigue. Negative for activity change, appetite change, fever and unexpected weight change.  HENT:  Negative for congestion, ear pain, rhinorrhea, sinus pressure and sore throat.   Eyes:  Negative for pain, redness and visual disturbance.  Respiratory:  Negative for cough, shortness of breath and wheezing.   Cardiovascular:  Negative for chest pain and  palpitations.  Gastrointestinal:  Negative for abdominal pain, blood in stool, constipation and diarrhea.  Endocrine: Negative for polydipsia and polyuria.  Genitourinary:  Negative for dysuria, frequency and urgency.  Musculoskeletal:  Negative for arthralgias, back pain and myalgias.  Skin:  Negative for pallor and rash.  Allergic/Immunologic: Negative for environmental allergies.  Neurological:  Negative for dizziness, syncope and headaches.  Hematological:  Negative for adenopathy. Does not bruise/bleed easily.  Psychiatric/Behavioral:  Positive for sleep disturbance. Negative for decreased concentration and dysphoric mood. The patient is nervous/anxious.        Objective:   Physical Exam Constitutional:      General: She is not in acute distress.    Appearance: Normal appearance. She is well-developed. She is not ill-appearing or diaphoretic.     Comments: Underweight   HENT:     Head: Normocephalic and atraumatic.     Right Ear: Tympanic membrane, ear canal and external ear normal.     Left Ear: Tympanic membrane, ear canal and external ear normal.     Nose: Nose normal. No congestion.     Mouth/Throat:     Mouth: Mucous membranes are moist.     Pharynx: Oropharynx is clear. No posterior oropharyngeal erythema.  Eyes:     General: No scleral icterus.    Extraocular Movements: Extraocular movements intact.     Conjunctiva/sclera: Conjunctivae normal.     Pupils: Pupils are equal, round, and reactive to light.  Neck:     Thyroid: No thyromegaly.     Vascular: No carotid bruit or JVD.  Cardiovascular:     Rate and Rhythm: Normal rate and regular rhythm.     Pulses: Normal pulses.     Heart sounds: Normal heart sounds.     No gallop.  Pulmonary:     Effort: Pulmonary effort is normal. No respiratory distress.     Breath sounds: Normal breath sounds. No wheezing.     Comments: Good air exch Chest:     Chest wall: No tenderness.  Abdominal:     General: Bowel sounds are  normal. There is no distension or abdominal bruit.     Palpations: Abdomen is soft. There is no mass.     Tenderness: There is no abdominal tenderness.     Hernia: No hernia is present.  Genitourinary:    Comments: Declines breast exam Musculoskeletal:        General: No tenderness. Normal range of motion.     Cervical back: Normal range of motion and neck supple. No rigidity. No muscular tenderness.     Right lower leg: No edema.     Left lower leg: No edema.     Comments: Moderate kyphosis   Lymphadenopathy:     Cervical: No cervical adenopathy.  Skin:    General: Skin is warm and dry.     Coloration: Skin is not pale.     Findings: No erythema or rash.     Comments: Solar lentigines diffusely tanned  Neurological:     Mental Status: She is alert. Mental status is at baseline.     Cranial Nerves: No cranial nerve deficit.  Motor: No abnormal muscle tone.     Coordination: Coordination normal.     Gait: Gait normal.     Deep Tendon Reflexes: Reflexes are normal and symmetric. Reflexes normal.  Psychiatric:        Attention and Perception: Attention normal.        Mood and Affect: Mood normal.        Cognition and Memory: Cognition and memory normal.           Assessment & Plan:   Problem List Items Addressed This Visit       Cardiovascular and Mediastinum   Aortic calcification (HCC)    Need to keep blood pressure and lipids controlled No symptoms or clinical changes       Essential hypertension    bp in fair control at this time  BP Readings from Last 1 Encounters:  05/10/23 131/75   No changes needed Most recent labs reviewed  Disc lifstyle change with low sodium diet and exercise  Chlorthalidone 15 mg daily  Losartan 50 mg daily  Amlodipine 5 mg daily   GFR is improved this draw          Relevant Orders   CBC with Differential/Platelet   Lipid panel   TSH     Endocrine   Hypothyroidism    No clinical changes TSH today  Continues  levothyroxine 75 mcg daily         Relevant Orders   TSH     Musculoskeletal and Integument   Osteoporosis    Due for prolia now that her GFR is back to normal  No falls or new fracture  Dexa 04/2022  Taking ca and D Walking Encouraged to add some strength building exercise        Relevant Medications   PROLIA 60 MG/ML SOSY injection   Other Relevant Orders   VITAMIN D 25 Hydroxy (Vit-D Deficiency, Fractures)     Other   Anxiety    Doing better with addn of buspar 7.5 mg bid Tolerates well Reviewed stressors/ coping techniques/symptoms/ support sources/ tx options and side effects in detail today  Continues nortriptyline, trazodone Lorazepam at night  Sleep disorder is still severe       HYPERCHOLESTEROLEMIA    Disc goals for lipids and reasons to control them Rev last labs with pt Rev low sat fat diet in detail   Lab today  Continues atorvastatin 80 mg daily and zetia 10 mg daily       Relevant Orders   Lipid panel   Hepatic function panel   Low serum vitamin B12    B12 level today  B12 shot today  No clinical changes       Relevant Orders   Vitamin B12   Prediabetes    A1c drawn   Encouraged more protein calories to gain weight but not excess sugar Reviewed protein sources        Relevant Orders   Hemoglobin A1c   Routine general medical examination at a health care facility - Primary    Reviewed health habits including diet and exercise and skin cancer prevention Reviewed appropriate screening tests for age  Also reviewed health mt list, fam hx and immunization status , as well as social and family history   See HPI Labs reviewed and ordered Will inq about Td at pharmacy Flu shot tiven today  Declines shingrix Declines any breast cancer screening  [facility]td eye care  Colonoscopy utd 03/2018  Dexa utd 04/2022  PHQ 0       Other Visit Diagnoses     Need for influenza vaccination       Relevant Orders   Flu Vaccine Trivalent High Dose  (Fluad) (Completed)

## 2023-05-10 NOTE — Assessment & Plan Note (Signed)
Doing better with addn of buspar 7.5 mg bid Tolerates well Reviewed stressors/ coping techniques/symptoms/ support sources/ tx options and side effects in detail today  Continues nortriptyline, trazodone Lorazepam at night  Sleep disorder is still severe

## 2023-05-10 NOTE — Patient Instructions (Addendum)
Flu shot today  B12 shot today   Labs today   We will schedule nurse visit for prolia   If you want to update your tetanus shot- check in with pharmacy    Try to eat regular meals even if not hungry  For protein  The following are examples of protein in diet  Meat  Fish  Eggs  Dairy products  Soy products  Oat milk  Almond milk Nuts and nut butters  Dried beans    Keep walking  Add some strength training to your routine, this is important for bone and brain health and can reduce your risk of falls and help your body use insulin properly and regulate weight  Light weights, exercise bands , and internet videos are a good way to start  Yoga (chair or regular), machines , floor exercises or a gym with machines are also good options

## 2023-05-10 NOTE — Assessment & Plan Note (Signed)
Need to keep blood pressure and lipids controlled No symptoms or clinical changes

## 2023-05-10 NOTE — Assessment & Plan Note (Signed)
No clinical changes TSH today  Continues levothyroxine 75 mcg daily

## 2023-05-11 ENCOUNTER — Other Ambulatory Visit: Payer: Self-pay | Admitting: Family Medicine

## 2023-05-11 LAB — TSH: TSH: 3.36 u[IU]/mL (ref 0.35–5.50)

## 2023-05-11 LAB — VITAMIN D 25 HYDROXY (VIT D DEFICIENCY, FRACTURES): VITD: 42.65 ng/mL (ref 30.00–100.00)

## 2023-05-11 LAB — VITAMIN B12: Vitamin B-12: 224 pg/mL (ref 211–911)

## 2023-05-11 NOTE — Telephone Encounter (Signed)
Name of Medication: Ativan Name of Pharmacy: [facility] Mail order Last Fill or Written Date and Quantity: 03/09/23 #30 tabs 1 refills Last Office Visit and Type:CPE 05/09/23 Next Office Visit and Type: CPE 05/14/24

## 2023-05-12 ENCOUNTER — Encounter: Payer: Self-pay | Admitting: *Deleted

## 2023-05-17 ENCOUNTER — Ambulatory Visit (INDEPENDENT_AMBULATORY_CARE_PROVIDER_SITE_OTHER): Payer: Medicare HMO

## 2023-05-17 DIAGNOSIS — M810 Age-related osteoporosis without current pathological fracture: Secondary | ICD-10-CM

## 2023-05-17 DIAGNOSIS — M8000XS Age-related osteoporosis with current pathological fracture, unspecified site, sequela: Secondary | ICD-10-CM

## 2023-05-17 MED ORDER — DENOSUMAB 60 MG/ML ~~LOC~~ SOSY
60.0000 mg | PREFILLED_SYRINGE | Freq: Once | SUBCUTANEOUS | Status: AC
Start: 2023-05-17 — End: 2023-05-17
  Administered 2023-05-17: 60 mg via SUBCUTANEOUS

## 2023-05-17 NOTE — Progress Notes (Signed)
Per orders of Dr. [name], injection of prolia 60 mg given by Rena Isley in left arm Patient tolerated injection well. Patient will make appointment for 6 month.   

## 2023-05-26 ENCOUNTER — Ambulatory Visit: Payer: Medicare HMO | Admitting: Dermatology

## 2023-06-06 ENCOUNTER — Encounter: Payer: Self-pay | Admitting: Urology

## 2023-06-06 ENCOUNTER — Ambulatory Visit: Payer: Medicare HMO | Admitting: Urology

## 2023-06-06 VITALS — BP 153/75 | HR 99 | Ht 62.75 in | Wt 103.0 lb

## 2023-06-06 DIAGNOSIS — R350 Frequency of micturition: Secondary | ICD-10-CM | POA: Diagnosis not present

## 2023-06-06 DIAGNOSIS — R339 Retention of urine, unspecified: Secondary | ICD-10-CM | POA: Diagnosis not present

## 2023-06-06 DIAGNOSIS — R319 Hematuria, unspecified: Secondary | ICD-10-CM | POA: Diagnosis not present

## 2023-06-06 DIAGNOSIS — N200 Calculus of kidney: Secondary | ICD-10-CM | POA: Diagnosis not present

## 2023-06-06 LAB — URINALYSIS, COMPLETE
Bilirubin, UA: NEGATIVE
Glucose, UA: NEGATIVE
Nitrite, UA: NEGATIVE
Specific Gravity, UA: 1.02 (ref 1.005–1.030)
Urobilinogen, Ur: 0.2 mg/dL (ref 0.2–1.0)
pH, UA: 6 (ref 5.0–7.5)

## 2023-06-06 LAB — MICROSCOPIC EXAMINATION

## 2023-06-06 LAB — BLADDER SCAN AMB NON-IMAGING: Scan Result: 0

## 2023-06-06 NOTE — Progress Notes (Signed)
   06/06/23  CC:  Chief Complaint  Patient presents with   Procedure    HPI: Refer to my previous office note 03/28/2023.  RUS performed 04/05/2023 showed a nonobstructing 8 mm left lower pole calculus.    Repeat PVR today was 0 mL  Blood pressure (!) 153/75, pulse 99, height 5' 2.75" (1.594 m), weight 103 lb (46.7 kg). NED. A&Ox3.   No respiratory distress   Abd soft, NT, ND Normal external genitalia with patent urethral meatus  Cystoscopy Procedure Note  Patient identification was confirmed, informed consent was obtained, and patient was prepped using Betadine solution.  Lidocaine jelly was administered per urethral meatus.    Procedure: - Flexible cystoscope introduced, without any difficulty.   - Thorough search of the bladder revealed:    normal urethral meatus    normal urothelium    no stones    no ulcers     no tumors    no urethral polyps    no trabeculation  - Ureteral orifices were normal in position and appearance.  Post-Procedure: - Patient tolerated the procedure well  Assessment/ Plan: No bladder mucosal abnormalities on cystoscopy. Repeat PVR 0 mL 1 year follow-up with [facility]   Riki Altes, MD

## 2023-06-12 ENCOUNTER — Other Ambulatory Visit: Payer: Self-pay

## 2023-06-12 ENCOUNTER — Emergency Department
Admission: EM | Admit: 2023-06-12 | Discharge: 2023-06-12 | Disposition: A | Discharge status: Home / Self Care | Payer: Medicare HMO | Attending: Emergency Medicine | Admitting: Emergency Medicine

## 2023-06-12 DIAGNOSIS — N390 Urinary tract infection, site not specified: Secondary | ICD-10-CM | POA: Diagnosis not present

## 2023-06-12 DIAGNOSIS — I10 Essential (primary) hypertension: Secondary | ICD-10-CM | POA: Insufficient documentation

## 2023-06-12 DIAGNOSIS — E039 Hypothyroidism, unspecified: Secondary | ICD-10-CM | POA: Diagnosis not present

## 2023-06-12 DIAGNOSIS — R300 Dysuria: Secondary | ICD-10-CM | POA: Diagnosis present

## 2023-06-12 LAB — URINALYSIS, ROUTINE W REFLEX MICROSCOPIC
Bacteria, UA: NONE SEEN
Bilirubin Urine: NEGATIVE
Glucose, UA: NEGATIVE mg/dL
Ketones, ur: NEGATIVE mg/dL
Nitrite: NEGATIVE
Protein, ur: 100 mg/dL — AB
RBC / HPF: 0 RBC/hpf (ref 0–5)
Specific Gravity, Urine: 1.013 (ref 1.005–1.030)
Squamous Epithelial / HPF: 0 /[HPF] (ref 0–5)
WBC, UA: 0 WBC/hpf (ref 0–5)
pH: 6 (ref 5.0–8.0)

## 2023-06-12 MED ORDER — CEFDINIR 300 MG PO CAPS: 300.0000 mg | ORAL_CAPSULE | Freq: Two times a day (BID) | ORAL | 0 refills | Status: AC

## 2023-06-12 NOTE — Discharge Instructions (Signed)
Please take the antibiotics as prescribed.  Please follow-up with your outpatient provider.  Please return for any new, worsening, or change in symptoms or other concerns including continued or worsening pain, fevers, vomiting, back pain, abdominal pain, or any other concerns.  It was a pleasure caring for you today.

## 2023-06-12 NOTE — ED Provider Notes (Signed)
[facility] Provider Note    Event Date/Time   First MD Initiated Contact with Patient 06/12/23 [phone]     (approximate)   History   Dysuria   HPI  [name] [name] is a 77 y.o. female with a past medical history of urinary retention who presents today for evaluation of dysuria for the past 3 days.  Patient reports that she has suprapubic pressure, and burning with urination.  She has not had any abdominal pain or flank pain.  No fevers or chills.  No nausea or vomiting.  No difficulty moving her bowels.  Patient Active Problem List   Diagnosis Date Noted   Urinary retention 02/24/2023   GERD (gastroesophageal reflux disease) 02/02/2022   Microscopic hematuria 02/02/2022   Low serum vitamin B12 08/07/2020   Prediabetes 03/07/2020   Tingling in extremities 03/07/2020   Neck pain 11/05/2019   Ganglion cyst of tendon sheath of right hand 05/17/2019   Medicare annual wellness visit, subsequent 10/16/2018   Melanosis, colon    Other specified disease of esophagus    Anxiety 01/09/2018   Coronary artery calcification seen on CAT scan 11/25/2017   Special screening for malignant neoplasms, colon 11/01/2017   Estrogen deficiency 09/26/2017   Aortic calcification (HCC) 11/30/2016   History of patellar fracture 11/30/2016   Routine general medical examination at a health care facility 06/27/2016   Loss of weight 06/04/2016   History of vertebral compression fracture 01/19/2016   Chronic foot pain 10/27/2011   Low back pain 04/14/2011   Herpes simplex virus (HSV) infection 11/17/2010   Essential hypertension 08/18/2009   BUNION 03/04/2009   Allergic rhinitis 01/10/2008   H/O goiter 02/09/2007   Hypothyroidism 02/09/2007   HYPERCHOLESTEROLEMIA 02/09/2007   History of depression 02/09/2007   OSTEOARTHRITIS 02/09/2007   Osteoporosis 02/09/2007   Disturbance in sleep behavior 02/09/2007          Physical Exam   Triage Vital Signs: ED Triage Vitals   Encounter Vitals Group     BP 06/12/23 0915 (!) 152/77     Systolic BP Percentile --      Diastolic BP Percentile --      Pulse Rate 06/12/23 0915 95     Resp 06/12/23 0915 18     Temp 06/12/23 0915 97.8 F (36.6 C)     Temp Source 06/12/23 0915 Oral     SpO2 06/12/23 0915 95 %     Weight 06/12/23 0916 101 lb 6.6 oz (46 kg)     Height 06/12/23 0916 5\' 2"  (1.575 m)     Head Circumference --      Peak Flow --      Pain Score 06/12/23 0916 0     Pain Loc --      Pain Education --      Exclude from Growth Chart --     Most recent vital signs: Vitals:   06/12/23 0915  BP: (!) 152/77  Pulse: 95  Resp: 18  Temp: 97.8 F (36.6 C)  SpO2: 95%    Physical Exam Vitals and nursing note reviewed.  Constitutional:      General: Awake and alert. No acute distress.    Appearance: Normal appearance. The patient is normal weight.  HENT:     Head: Normocephalic and atraumatic.     Mouth: Mucous membranes are moist.  Eyes:     General: PERRL. Normal EOMs        Right eye: No discharge.  Left eye: No discharge.     Conjunctiva/sclera: Conjunctivae normal.  Cardiovascular:     Rate and Rhythm: Normal rate and regular rhythm.     Pulses: Normal pulses.  Pulmonary:     Effort: Pulmonary effort is normal. No respiratory distress.     Breath sounds: Normal breath sounds.  Abdominal:     Abdomen is soft. There is mild suprapubic abdominal tenderness. No rebound or guarding. No distention.  No CVA tenderness Musculoskeletal:        General: No swelling. Normal range of motion.     Cervical back: Normal range of motion and neck supple.  Skin:    General: Skin is warm and dry.     Capillary Refill: Capillary refill takes less than 2 seconds.     Findings: No rash.  Neurological:     Mental Status: The patient is awake and alert.      ED Results / Procedures / Treatments   Labs (all labs ordered are listed, but only abnormal results are displayed) Labs Reviewed   URINALYSIS, ROUTINE W REFLEX MICROSCOPIC - Abnormal; Notable for the following components:      Result Value   Color, Urine YELLOW (*)    APPearance TURBID (*)    Hgb urine dipstick LARGE (*)    Protein, ur 100 (*)    Leukocytes,Ua MODERATE (*)    All other components within normal limits  URINE CULTURE     EKG     RADIOLOGY     PROCEDURES:  Critical Care performed:   Procedures   MEDICATIONS ORDERED IN ED: Medications - No data to display   IMPRESSION / MDM / ASSESSMENT AND PLAN / ED COURSE  I reviewed the triage vital signs and the nursing notes.   Differential diagnosis includes, but is not limited to, dysuria, glucose urea, urinary tract infection, pyelonephritis.  Reviewed the patient's chart.  Patient recently saw Dr. Lonna Cobb with urology and had a cystoscopy at which time she was found to have a normal urethral meatus, normal urothelium, no stones, no ulcers, no tumors, no urethral polyps, no trabeculation and she had a PVR of 0.  Patient is awake and alert, hemodynamically stable and afebrile.  She has no CVA tenderness, fever, chills, abdominal pain, nausea, vomiting to suggest pyelonephritis.  No history of kidney stones, no flank pain to suggest kidney stone.  Urinalysis is suspicious for urinary tract infection, which is consistent with her symptoms of burning, urgency, and frequency.  It was sent for culture.  Recommended initiation of antibiotic treatment which patient is in agreement with.  We discussed return precautions and outpatient follow-up.  Patient or stands and agrees with plan.  She was discharged in stable condition.   Patient's presentation is most consistent with acute complicated illness / injury requiring diagnostic workup.      FINAL CLINICAL IMPRESSION(S) / ED DIAGNOSES   Final diagnoses:  Lower urinary tract infectious disease     Rx / DC Orders   ED Discharge Orders          Ordered    cefdinir (OMNICEF) 300 MG capsule   2 times daily        06/12/23 9629             Note:  This document was prepared using Dragon voice recognition software and may include unintentional dictation errors.   Keturah Shavers 06/12/23 1146    Concha Se, MD 06/12/23 1322

## 2023-06-12 NOTE — ED Notes (Signed)
See triage note  Presents with some urinary sxs'  States she is having some lower back discomfort  Denies any fever  States she feels like she has to go to the bathroom but only voiding small amts

## 2023-06-12 NOTE — ED Triage Notes (Addendum)
Pt to Ed via POV from home. Pt ambulatory to triage. Pt reports painful urination and pain upon sitting since Tuesday. Pt reports has noticed some scant bleeding in urine. Pt denies V/D or abd pain. Pt denies hx kidney stones or UTI.

## 2023-06-14 ENCOUNTER — Ambulatory Visit: Payer: Medicare HMO

## 2023-06-14 LAB — URINE CULTURE: Culture: >=100000 — AB

## 2023-06-15 ENCOUNTER — Other Ambulatory Visit: Payer: Self-pay | Admitting: Family Medicine

## 2023-06-15 NOTE — Progress Notes (Signed)
ED Antimicrobial Stewardship Positive Culture Follow Up   [name] [name] is an 77 y.o. female who presented to [facility] on 06/12/2023 with a chief complaint of some urinary signs and symptoms. States only going to the bathroom able to void in small amounts. Has some lower back discomfort. Was discharged on cefdninir 300 mg PO x 7 days.   Chief Complaint  Patient presents with   Dysuria   Recent Results (from the past 720 hour(s))  Microscopic Examination     Status: Abnormal   Collection Time: 06/06/23  8:44 AM   Urine  Result Value Ref Range Status   WBC, UA 0-5 0 - 5 /hpf Final   RBC, Urine 3-10 (A) 0 - 2 /hpf Final   Epithelial Cells (non renal) 0-10 0 - 10 /hpf Final   Casts Present (A) None seen /lpf Final   Cast Type Hyaline casts N/A Final    Comment: Granular casts   Bacteria, UA Few None seen/Few Final  Urine Culture     Status: Abnormal   Collection Time: 06/12/23  9:37 AM   Specimen: Urine, Clean Catch  Result Value Ref Range Status   Specimen Description   Final    URINE, CLEAN CATCH Performed at [facility], [address], [place], [place] [address]    Special Requests   Final    NONE Performed at [facility], [address] [place], [place] [address]    Culture >=100,000 COLONIES/mL SERRATIA MARCESCENS (A)  Final   Report Status 06/14/2023 FINAL  Final   Organism ID, Bacteria SERRATIA MARCESCENS (A)  Final      Susceptibility   Serratia marcescens - MIC*    CEFEPIME <=0.12 SENSITIVE Sensitive     CEFTRIAXONE <=0.25 SENSITIVE Sensitive     CIPROFLOXACIN <=0.25 SENSITIVE Sensitive     GENTAMICIN <=1 SENSITIVE Sensitive     NITROFURANTOIN 128 RESISTANT Resistant     TRIMETH/SULFA <=20 SENSITIVE Sensitive     * >=100,000 COLONIES/mL SERRATIA MARCESCENS    [x]  Treated with cefdinir, organism with potential intrinsic resistant to prescribed antimicrobial. [id number] patient. []  Patient discharged originally  without antimicrobial agent and treatment is now indicated  New antibiotic prescription: Bactrim SS BID x 3 day  Phone Call Patient reported feeling better, but not 100% and not back to baseline Patient reported ongoing back pain, but no worse than when presented this weekend. Unclear if they also have side pain vs back pain.  No chills or fevers reported this week  Due to weight, called in SS instead of DS Bactrim to treat for potential intrinsic resistance  Educated the patient on making sure to take with a full glass of water (at least 8 oz) and to keep hydrated while on medication.   ED Provider: Sharyn Creamer, MD  Effie Shy, PharmD PGY1 Pharmacy Resident 06/15/2023, 10:49 AM

## 2023-06-19 NOTE — Progress Notes (Unsigned)
Cardiology Office Note  Date:  06/20/2023   ID:  [name] [name], DOB 1946/05/01, MRN [id number]  PCP:  [name] Pimple, MD   Chief Complaint  Patient presents with   Follow-up    Last seen in 2021 by Dr. Mariah [name]. Patient c/o shortness of breath with walking a short distance. Medications reviewed by the patient verbally.     HPI:  [name] is a 77 year old woman with history of Former smoker -quit in 09, COPD Known aortic calcification on imaging /CT scan Chronic back pain  Chronic chest pain Cardiac catheterization April 2019 with no significant coronary disease but she does have moderate diffuse calcification Presents for f/u of her chest pain, positive stress test, severe coronary calcification on CT scan,  cardiac catheterization  Last seen by myself in clinic November 2021 In follow-up today reports she has been doing some travel with family, going to [place].  Works in her garden Denies shortness of breath or chest pain on ambulation Occasionally has episodes of shortness of breath, does not feel these are worse or new Attributes them to her COPD, requesting refill on her inhaler  Denies chest pain concerning for angina Activity limited by chronic back pain  Tolerating Lipitor with Zetia (Zetia out of refills)  EKG personally reviewed by myself on todays visit EKG Interpretation Date/Time:  Monday June 20 2023 16:21:06 EDT Ventricular Rate:  78 PR Interval:  130 QRS Duration:  84 QT Interval:  380 QTC Calculation: 433 R Axis:   -5  Text Interpretation: Normal sinus rhythm with sinus arrhythmia Normal ECG When compared with ECG of 18-May-2022 14:48, Questionable change in QRS axis Confirmed by Julien Nordmann [id number]) on 06/20/2023 4:27:56 PM    Other past medical history reviewed Cardiac catheterization December 23, 2017 No significant coronary disease noted, moderate diffuse calcification noted  CT chest  2015 Images reviewed with her in  detail Moderate aortic atherosclerosis particularly in the arch Very mild if any in the carotids Mild to moderate in the distal descending aorta, iliacs  PMH:   has a past medical history of Aortic calcification (HCC), COPD (chronic obstructive pulmonary disease) (HCC), Depression, Hyperlipidemia, Hypertension, Hypothyroidism, Osteoarthritis, Osteoporosis, Shingles, Sleep disorder, and Tobacco abuse.  PSH:    Past Surgical History:  Procedure Laterality Date   ABDOMINAL HYSTERECTOMY     BIOPSY THYROID     pos neoplasm, surgery 09/2006   bunions  06/1998   CATARACT EXTRACTION W/PHACO Left 04/20/2022   Procedure: CATARACT EXTRACTION PHACO AND INTRAOCULAR LENS PLACEMENT (IOC) LEFT;  Surgeon: Galen Manila, MD;  Location: [facility];  Service: Ophthalmology;  Laterality: Left;  10.24 0058.5   CATARACT EXTRACTION W/PHACO Right 05/04/2022   Procedure: CATARACT EXTRACTION PHACO AND INTRAOCULAR LENS PLACEMENT (IOC) RIGHT;  Surgeon: Galen Manila, MD;  Location: [facility];  Service: Ophthalmology;  Laterality: Right;  6.94 00:43.2   COLONOSCOPY WITH PROPOFOL N/A 03/15/2018   Procedure: COLONOSCOPY WITH PROPOFOL;  Surgeon: Pasty Spillers, MD;  Location: [facility];  Service: Endoscopy;  Laterality: N/A;   CORONARY ANGIOPLASTY     CXR-copd, ts partial compression fracture  12/2006   dexa  12/1996   ESOPHAGOGASTRODUODENOSCOPY (EGD) WITH PROPOFOL N/A 03/15/2018   Procedure: ESOPHAGOGASTRODUODENOSCOPY (EGD) WITH PROPOFOL;  Surgeon: Pasty Spillers, MD;  Location: [facility] END[facility];  Service: Endoscopy;  Laterality: N/A;   GANGLION CYST EXCISION Right 05/16/2019   Procedure: REMOVAL GANGLION OF WRIST;  Surgeon: Christena Flake, MD;  Location: [facility] ORS;  Service: Orthopedics;  Laterality: Right;   hashimoto  02/1997   hyerectomy- cervical ca cells     LEFT HEART CATH AND CORONARY ANGIOGRAPHY Left 12/23/2017   Procedure: LEFT HEART CATH AND CORONARY ANGIOGRAPHY;  Surgeon:  Antonieta Iba, MD;  Location: [facility] INVASIVE CV LAB;  Service: Cardiovascular;  Laterality: Left;   left wrist fracture     surgery   right thyroid lobectomy, goiter, thyroiditis  12/2006   stress fractures foot     THYROIDECTOMY  11/2009    Current Outpatient Medications  Medication Sig Dispense Refill   albuterol (VENTOLIN HFA) 108 (90 Base) MCG/ACT inhaler Inhale 2 puffs into the lungs every 4 (four) hours as needed for wheezing or shortness of breath.     amLODipine (NORVASC) 5 MG tablet TAKE 1 TABLET EVERY DAY 90 tablet 1   atorvastatin (LIPITOR) 80 MG tablet TAKE 1 TABLET EVERY DAY 90 tablet 1   busPIRone (BUSPAR) 15 MG tablet Take 0.5 tablets (7.5 mg total) by mouth 2 (two) times daily. 90 tablet 3   Calcium Carbonate-Vit D-Min (CALCIUM 1200 PO) Take 1 tablet by mouth daily.      chlorthalidone (HYGROTEN) 15 MG tablet Take 1 tablet (15 mg total) by mouth daily. 90 tablet 0   cyanocobalamin (VITAMIN B12) 1000 MCG/ML injection Inject 1,000 mcg into the muscle every 30 (thirty) days.     cyclobenzaprine (FLEXERIL) 10 MG tablet TAKE 1 TABLET AT BEDTIME AS NEEDED, caution for sedation/dizziness and falls 90 tablet 1   ezetimibe (ZETIA) 10 MG tablet TAKE 1 TABLET EVERY DAY 90 tablet 0   famotidine (PEPCID) 20 MG tablet Take 2 tablets (40 mg total) by mouth 2 (two) times daily. 360 tablet 1   levothyroxine (SYNTHROID) 75 MCG tablet TAKE 1 TABLET EVERY DAY BEFORE BREAKFAST 90 tablet 1   lidocaine (XYLOCAINE) 2 % solution Use as directed 15 mLs in the mouth or throat as needed for mouth pain. 100 mL 0   LORazepam (ATIVAN) 1 MG tablet TAKE 1 TABLET AT BEDTIME AS NEEDED FOR SLEEP 30 tablet 2   losartan (COZAAR) 50 MG tablet Take 1 tablet (50 mg total) by mouth daily. 90 tablet 3   nortriptyline (PAMELOR) 75 MG capsule TAKE 1 CAPSULE AT BEDTIME 90 capsule 1   PROLIA 60 MG/ML SOSY injection Inject 60 mg into the skin every 6 (six) months.     traZODone (DESYREL) 50 MG tablet TAKE 1/2 TO 1 TABLET  (25 TO 50MG  TOTAL) AT BEDTIME AS NEEDED FOR SLEEP. 90 tablet 1   No current facility-administered medications for this visit.   Allergies:   Fosamax [alendronate sodium] and Raloxifene hcl   Social History:  The patient  reports that she quit smoking about 3 years ago. Her smoking use included cigarettes. She has never used smokeless tobacco. She reports that she does not drink alcohol and does not use drugs.   Family History:   family history includes Coronary artery disease in her mother; Diabetes in her mother; Hypertension in her father; Stroke in her father.   Review of Systems: Review of Systems  Constitutional: Negative.   HENT: Negative.    Respiratory: Negative.    Cardiovascular: Negative.   Gastrointestinal: Negative.   Musculoskeletal: Negative.   Neurological: Negative.   Psychiatric/Behavioral: Negative.    All other systems reviewed and are negative.   PHYSICAL EXAM: VS:  BP 130/64 (BP Location: Left Arm, Patient Position: Sitting, Cuff Size: Normal)   Pulse 78   Ht 5\' 5"  (1.651 m)  Wt 102 lb 6 oz (46.4 kg)   SpO2 93%   BMI 17.04 kg/m  , BMI Body mass index is 17.04 kg/m. Constitutional:  oriented to person, place, and time. No distress.  HENT:  Head: Grossly normal Eyes:  no discharge. No scleral icterus.  Neck: No JVD, no carotid bruits  Cardiovascular: Regular rate and rhythm, 1-2/6 SEM RSB, Pulmonary/Chest: Clear to auscultation bilaterally, no wheezes or rails Abdominal: Soft.  no distension.  no tenderness.  Musculoskeletal: Normal range of motion Neurological:  normal muscle tone. Coordination normal. No atrophy Skin: Skin warm and dry Psychiatric: normal affect, pleasant  Recent Labs: 05/02/2023: BUN 17; Creatinine, Ser 0.89; Potassium 3.8; Sodium 141 05/10/2023: ALT 17; Hemoglobin 13.6; Platelets 283.0; TSH 3.36    Lipid Panel Lab Results  Component Value Date   CHOL 158 05/10/2023   HDL 63.40 05/10/2023   LDLCALC 82 05/10/2023   TRIG 64.0  05/10/2023      Wt Readings from Last 3 Encounters:  06/20/23 102 lb 6 oz (46.4 kg)  06/12/23 101 lb 6.6 oz (46 kg)  06/06/23 103 lb (46.7 kg)     ASSESSMENT AND PLAN:  Aortic calcification (HCC) On Lipitor 80 Zetia 10 mg daily Stressed importance of medication compliance  Hyperlipidemia  Lipitor 80 daily,   Zetia 10 mg daily, refilled Ideally goal LDL less than 70, recommend she take medications daily  Chest pain, unspecified type -   cardiac catheterization showing heavy  calcification with no significant stenosis, 2019 Smoking cessation recommended, compliance with Lipitor and Zetia Denies anginal symptoms, will hold off on additional workup Discussed anginal symptoms to watch for, stress test could be considered at that time  Coronary artery calcification seen on CAT scan Calcification noted in the LAD Denies anginal symptoms, no further workup at this time  TOBACCO USE, QUIT Reports that she quit in 2009 Occasional episodes of shortness of breath, albuterol inhaler refilled  PVCs Asymptomatic, for any worsening symptoms would add beta-blocker  GERD Takes omeprazole as needed  Essential hypertension Blood pressure is well controlled on today's visit. No changes made to the medications.    Orders Placed This Encounter  Procedures   EKG 12-Lead     Signed, Dossie Arbour, M.D., Ph.D. 06/20/2023  [facility] [place], [place] [phone]

## 2023-06-20 ENCOUNTER — Encounter: Payer: Self-pay | Admitting: Cardiovascular Disease

## 2023-06-20 ENCOUNTER — Ambulatory Visit: Payer: Medicare HMO | Attending: Cardiovascular Disease | Admitting: Cardiovascular Disease

## 2023-06-20 VITALS — BP 130/64 | HR 78 | Ht 65.0 in | Wt 102.4 lb

## 2023-06-20 DIAGNOSIS — I700 Atherosclerosis of aorta: Secondary | ICD-10-CM

## 2023-06-20 DIAGNOSIS — I2510 Atherosclerotic heart disease of native coronary artery without angina pectoris: Secondary | ICD-10-CM

## 2023-06-20 DIAGNOSIS — J449 Chronic obstructive pulmonary disease, unspecified: Secondary | ICD-10-CM | POA: Diagnosis not present

## 2023-06-20 DIAGNOSIS — R7303 Prediabetes: Secondary | ICD-10-CM | POA: Diagnosis not present

## 2023-06-20 DIAGNOSIS — E7800 Pure hypercholesterolemia, unspecified: Secondary | ICD-10-CM

## 2023-06-20 DIAGNOSIS — I10 Essential (primary) hypertension: Secondary | ICD-10-CM | POA: Diagnosis not present

## 2023-06-20 MED ORDER — ALBUTEROL SULFATE HFA 108 (90 BASE) MCG/ACT IN AERS: 1.0000 | INHALATION_SPRAY | RESPIRATORY_TRACT | 5 refills | Status: DC | PRN

## 2023-06-20 MED ORDER — EZETIMIBE 10 MG PO TABS: 10.0000 mg | ORAL_TABLET | Freq: Every day | ORAL | 3 refills | Status: DC

## 2023-06-20 NOTE — Patient Instructions (Signed)

## 2023-06-22 ENCOUNTER — Other Ambulatory Visit: Payer: Self-pay | Admitting: Family Medicine

## 2023-07-05 ENCOUNTER — Ambulatory Visit (INDEPENDENT_AMBULATORY_CARE_PROVIDER_SITE_OTHER): Payer: Medicare HMO

## 2023-07-05 DIAGNOSIS — E538 Deficiency of other specified B group vitamins: Secondary | ICD-10-CM

## 2023-07-05 MED ORDER — CYANOCOBALAMIN 1000 MCG/ML IJ SOLN
1000.0000 ug | Freq: Once | INTRAMUSCULAR | Status: AC
Start: 2023-07-05 — End: 2023-07-05
  Administered 2023-07-05: 1000 ug via INTRAMUSCULAR

## 2023-07-05 NOTE — Progress Notes (Signed)
Patient presented for B 12 injection given by Makinzy Cleere, CMA to right deltoid, patient voiced no concerns nor showed any signs of distress during injection.  

## 2023-07-12 ENCOUNTER — Telehealth: Payer: Self-pay

## 2023-07-12 NOTE — Telephone Encounter (Signed)
Transition Care Management Unsuccessful Follow-up Telephone Call  Date of discharge and from where:  06/12/2023 [facility]  Attempts:  1st Attempt  Reason for unsuccessful TCM follow-up call:  No answer/busy  Shakiah Wester [name]  [facility], [facility] Guide Direct Dial: [phone]  Website: [email or web address]

## 2023-07-13 ENCOUNTER — Telehealth: Payer: Self-pay

## 2023-07-13 NOTE — Telephone Encounter (Signed)
Transition Care Management Unsuccessful Follow-up Telephone Call  Date of discharge and from where:  06/12/2023  [facility]  Attempts:  2nd Attempt  Reason for unsuccessful TCM follow-up call:  No answer/busy  [name] [name]  [id number], [facility] Guide Direct Dial: [phone]  Website: [email or web address]

## 2023-08-09 ENCOUNTER — Ambulatory Visit (INDEPENDENT_AMBULATORY_CARE_PROVIDER_SITE_OTHER): Payer: Medicare HMO

## 2023-08-09 DIAGNOSIS — E538 Deficiency of other specified B group vitamins: Secondary | ICD-10-CM | POA: Diagnosis not present

## 2023-08-09 MED ORDER — CYANOCOBALAMIN 1000 MCG/ML IJ SOLN
1000.0000 ug | Freq: Once | INTRAMUSCULAR | Status: AC
Start: 2023-08-09 — End: 2023-08-09
  Administered 2023-08-09: 1000 ug via INTRAMUSCULAR

## 2023-08-09 NOTE — Progress Notes (Signed)
Per orders of Dr. Roxy Manns, injection of B-12 given by Melina Copa in left deltoid. Patient tolerated injection well. Patient will make appointment for 1 month.

## 2023-08-10 ENCOUNTER — Other Ambulatory Visit: Payer: Self-pay | Admitting: Family Medicine

## 2023-08-10 NOTE — Telephone Encounter (Signed)
Name of Medication: Ativan Name of Pharmacy: [facility]) Mail order Last Fill or Written Date and Quantity: 05/11/23 #30 tabs 2 refills Last Office Visit and Type:CPE 05/09/23 Next Office Visit and Type: CPE 05/14/24

## 2023-08-16 ENCOUNTER — Other Ambulatory Visit: Payer: Self-pay | Admitting: Family Medicine

## 2023-09-13 ENCOUNTER — Ambulatory Visit (INDEPENDENT_AMBULATORY_CARE_PROVIDER_SITE_OTHER): Payer: Medicare HMO

## 2023-09-13 DIAGNOSIS — E538 Deficiency of other specified B group vitamins: Secondary | ICD-10-CM | POA: Diagnosis not present

## 2023-09-13 MED ORDER — CYANOCOBALAMIN 1000 MCG/ML IJ SOLN
1000.0000 ug | Freq: Once | INTRAMUSCULAR | Status: AC
  Administered 2023-09-13: 1000 ug via INTRAMUSCULAR

## 2023-09-13 NOTE — Progress Notes (Signed)
 Per orders of Dr. Roxy Manns, injection of vitamin b 12 given by Lewanda Rife in right deltoid. Patient tolerated injection well. Patient will make appointment for 1 month.

## 2023-09-14 ENCOUNTER — Other Ambulatory Visit: Payer: Self-pay | Admitting: Family Medicine

## 2023-09-14 MED ORDER — LEVOTHYROXINE SODIUM 75 MCG PO TABS: 75.0000 ug | ORAL_TABLET | Freq: Every day | ORAL | 2 refills | Status: DC

## 2023-09-14 MED ORDER — AMLODIPINE BESYLATE 5 MG PO TABS: 5.0000 mg | ORAL_TABLET | Freq: Every day | ORAL | 2 refills | Status: DC

## 2023-09-14 MED ORDER — NORTRIPTYLINE HCL 75 MG PO CAPS: 75.0000 mg | ORAL_CAPSULE | Freq: Every day | ORAL | 2 refills | Status: DC

## 2023-09-14 MED ORDER — CYCLOBENZAPRINE HCL 10 MG PO TABS: ORAL_TABLET | ORAL | 2 refills | Status: DC

## 2023-09-14 MED ORDER — CHLORTHALIDONE 15 MG PO TABS: 15.0000 mg | ORAL_TABLET | Freq: Every day | ORAL | 2 refills | Status: DC

## 2023-09-14 MED ORDER — LOSARTAN POTASSIUM 50 MG PO TABS: 50.0000 mg | ORAL_TABLET | Freq: Every day | ORAL | 2 refills | Status: DC

## 2023-09-14 MED ORDER — BUSPIRONE HCL 15 MG PO TABS: 7.5000 mg | ORAL_TABLET | Freq: Two times a day (BID) | ORAL | 2 refills | Status: DC

## 2023-09-14 MED ORDER — LORAZEPAM 1 MG PO TABS: 1.0000 mg | ORAL_TABLET | Freq: Every evening | ORAL | 2 refills | Status: DC | PRN

## 2023-09-14 MED ORDER — ATORVASTATIN CALCIUM 80 MG PO TABS: 80.0000 mg | ORAL_TABLET | Freq: Every day | ORAL | 2 refills | Status: DC

## 2023-09-14 MED ORDER — ALBUTEROL SULFATE HFA 108 (90 BASE) MCG/ACT IN AERS: 1.0000 | INHALATION_SPRAY | RESPIRATORY_TRACT | 3 refills | Status: AC | PRN

## 2023-09-14 MED ORDER — EZETIMIBE 10 MG PO TABS: 10.0000 mg | ORAL_TABLET | Freq: Every day | ORAL | 2 refills | Status: AC

## 2023-09-14 MED ORDER — FAMOTIDINE 20 MG PO TABS: 40.0000 mg | ORAL_TABLET | Freq: Two times a day (BID) | ORAL | 2 refills | Status: DC

## 2023-09-14 MED ORDER — TRAZODONE HCL 50 MG PO TABS: ORAL_TABLET | ORAL | 2 refills | Status: DC

## 2023-09-14 NOTE — Telephone Encounter (Addendum)
 CPE was on 05/10/23  Will not pend Prolia since [facility] handles that when it's due. Also we give B12 inj here at office    Most of these meds were filled recently but sent to mail order pharmacy. Pt is now requesting Rxs be sent to local pharmacy

## 2023-09-14 NOTE — Addendum Note (Signed)
 Addended by: Shon Millet on: 09/14/2023 04:38 PM   Modules accepted: Orders

## 2023-09-14 NOTE — Addendum Note (Signed)
 Addended by: Roxy Manns A on: 09/14/2023 08:38 PM   Modules accepted: Orders

## 2023-09-14 NOTE — Telephone Encounter (Signed)
 Copied from CRM [id number]. Topic: Clinical - Medication Refill >> Sep 14, 2023  1:14 PM Brittany M wrote: Most Recent Primary Care Visit:  Provider: SEBASTIAN SHU  Department: [facility]  Visit Type: NURSE VISIT  Date: 08/09/2023  Medication: albuterol ; amLODipine ; atorvastatin ; busPIRone ; chlorthalidone ; cyanocobalamin ; cyclobenzaprine ; ezetimibe ; famotidine ; levothyroxine ; LORazepam ; losartan  ; nortriptyline ; PROLIA ; traZODone   Has the patient contacted their pharmacy? Yes (Agent: If no, request that the patient contact the pharmacy for the refill. If patient does not wish to contact the pharmacy document the reason why and proceed with request.) (Agent: If yes, when and what did the pharmacy advise?)  Is this the correct pharmacy for this prescription? Yes If no, delete pharmacy and type the correct one.  This is the patient's preferred pharmacy:   [facility] [address] [place] [facility] [address] [place] [address] Phone: [phone] Fax: [phone]     Has the prescription been filled recently? Yes  Is the patient out of the medication? Yes  Has the patient been seen for an appointment in the last year OR does the patient have an upcoming appointment? Yes  Can we respond through MyChart? Yes  Agent: Please be advised that Rx refills may take up to 3 business days. We ask that you follow-up with your pharmacy.

## 2023-09-15 ENCOUNTER — Other Ambulatory Visit: Payer: Self-pay | Admitting: Family Medicine

## 2023-09-15 NOTE — Telephone Encounter (Signed)
 Please let pt know -not covered If she wants to pay out of pocket let me know  Otherwise will have to stop it

## 2023-09-15 NOTE — Telephone Encounter (Signed)
 See pharmacy note. Med not covered

## 2023-09-15 NOTE — Telephone Encounter (Signed)
 See note on flexeril it says it isn't covered by insurance

## 2023-09-15 NOTE — Telephone Encounter (Signed)
 Please call pharmacy and ask what alternatives are covered Thanks

## 2023-09-16 NOTE — Telephone Encounter (Signed)
 [facility] spoke with [facility]. They could not tell me what was covered; wasn't showing in their system but the pharmacist stated that he thinks the 25mg  tabs are covered and have patient take 1/2 tab every day. He couldn't say 100% but he stated that he has never seen this medication denied or not covered so he stated he thinks its because of the dosage.

## 2023-09-16 NOTE — Telephone Encounter (Signed)
 Patient advised about new rx instructions and verbalized understanding.

## 2023-09-16 NOTE — Telephone Encounter (Signed)
 She can check on price If not affordable perhaps ins covers a different muscle relaxer Thanks

## 2023-09-16 NOTE — Telephone Encounter (Signed)
 Spoke with patient and advised insurance will no longer cover rx and will need to pay out of pocket for rx if she still wants to take it. Patient will try paying out of pocket if not too expensive.

## 2023-09-16 NOTE — Telephone Encounter (Signed)
 I changed it to 1/2 of the 25 mg  Make sure please if this is approved that she is aware she will have to split pill Thanks

## 2023-09-27 DIAGNOSIS — H524 Presbyopia: Secondary | ICD-10-CM | POA: Diagnosis not present

## 2023-10-04 ENCOUNTER — Ambulatory Visit: Payer: Medicare HMO

## 2023-10-13 ENCOUNTER — Other Ambulatory Visit: Payer: Self-pay | Admitting: Family Medicine

## 2023-10-13 NOTE — Telephone Encounter (Signed)
 Last Fill: Ativan : 09/14/23     Flexeril : 09/14/23   Last OV: 05/10/23 Next OV: 05/14/24  Routing to provider for review/authorization.

## 2023-10-13 NOTE — Telephone Encounter (Signed)
 Copied from CRM [id number]. Topic: Clinical - Medication Refill >> Oct 13, 2023  9:37 AM Graeme ORN wrote: Most Recent Primary Care Visit:  Provider: TENNIE LARAY HERO  Department: [facility]  Visit Type: NURSE VISIT  Date: 09/13/2023  Medication:  cyclobenzaprine  (FLEXERIL ) 10 MG tablet LORazepam  (ATIVAN ) 1 MG tablet   Has the patient contacted their pharmacy? No (Agent: If no, request that the patient contact the pharmacy for the refill. If patient does not wish to contact the pharmacy document the reason why and proceed with request.) (Agent: If yes, when and what did the pharmacy advise?)   Is this the correct pharmacy for this prescription? Yes If no, delete pharmacy and type the correct one.  This is the patient's preferred pharmacy:   [facility] [address] [place] - [address] [place] [address] Phone: [phone] Fax: [phone]   Has the prescription been filled recently? Yes  Is the patient out of the medication? Yes  Has the patient been seen for an appointment in the last year OR does the patient have an upcoming appointment? Yes  Can we respond through MyChart? No  Agent: Please be advised that Rx refills may take up to 3 business days. We ask that you follow-up with your pharmacy.

## 2023-10-14 MED ORDER — LORAZEPAM 1 MG PO TABS: 1.0000 mg | ORAL_TABLET | Freq: Every evening | ORAL | 2 refills | Status: DC | PRN

## 2023-10-14 MED ORDER — CYCLOBENZAPRINE HCL 10 MG PO TABS: ORAL_TABLET | ORAL | 0 refills | Status: DC

## 2023-10-18 ENCOUNTER — Ambulatory Visit (INDEPENDENT_AMBULATORY_CARE_PROVIDER_SITE_OTHER): Payer: Medicare HMO

## 2023-10-18 DIAGNOSIS — E538 Deficiency of other specified B group vitamins: Secondary | ICD-10-CM

## 2023-10-18 MED ORDER — CYANOCOBALAMIN 1000 MCG/ML IJ SOLN
1000.0000 ug | Freq: Once | INTRAMUSCULAR | Status: AC
  Administered 2023-10-18: 1000 ug via INTRAMUSCULAR

## 2023-10-18 NOTE — Progress Notes (Signed)
Per orders of Dr. Roxy Manns, injection of vitamin b 12 given by Lewanda Rife in left deltoid. Patient tolerated injection well. Patient will make appointment for 1 month.

## 2023-11-17 ENCOUNTER — Ambulatory Visit: Payer: Medicare HMO

## 2023-11-22 ENCOUNTER — Ambulatory Visit: Payer: Medicare HMO

## 2023-11-29 ENCOUNTER — Ambulatory Visit

## 2023-12-06 ENCOUNTER — Ambulatory Visit (INDEPENDENT_AMBULATORY_CARE_PROVIDER_SITE_OTHER)

## 2023-12-06 DIAGNOSIS — E538 Deficiency of other specified B group vitamins: Secondary | ICD-10-CM | POA: Diagnosis not present

## 2023-12-06 MED ORDER — CYANOCOBALAMIN 1000 MCG/ML IJ SOLN
1000.0000 ug | Freq: Once | INTRAMUSCULAR | Status: AC
Start: 2023-12-06 — End: 2023-12-06
  Administered 2023-12-06: 1000 ug via INTRAMUSCULAR

## 2023-12-06 NOTE — Progress Notes (Signed)
 Per orders of Dr. Roxy Manns, who is out of office today and Dr Tillman Abide who is in office today injection of vitamin b 12 given by Lewanda Rife in right deltoid. Patient tolerated injection well. Patient will make appointment for 1 month.

## 2024-01-10 ENCOUNTER — Ambulatory Visit

## 2024-01-18 ENCOUNTER — Other Ambulatory Visit: Payer: Self-pay | Admitting: Family Medicine

## 2024-01-18 MED ORDER — CYCLOBENZAPRINE HCL 10 MG PO TABS: ORAL_TABLET | ORAL | 0 refills | Status: DC

## 2024-01-18 NOTE — Telephone Encounter (Signed)
 CPE scheduled 05/14/24  Last filled on 10/14/23 #90 tabs/ 0 refills

## 2024-01-18 NOTE — Telephone Encounter (Signed)
 Copied from CRM [id number]. Topic: Clinical - Medication Refill >> Jan 18, 2024  8:57 AM Kita Perish H wrote: Medication: cyclobenzaprine  (FLEXERIL ) 10 MG tablet  Has the patient contacted their pharmacy? No, usually calls in (Agent: If no, request that the patient contact the pharmacy for the refill. If patient does not wish to contact the pharmacy document the reason why and proceed with request.) (Agent: If yes, when and what did the pharmacy advise?)  This is the patient's preferred pharmacy:   [facility] [address] [place] - [address] [place] [address] Phone: [phone] Fax: [phone]   Is this the correct pharmacy for this prescription? Yes If no, delete pharmacy and type the correct one.   Has the prescription been filled recently? No  Is the patient out of the medication? Yes  Has the patient been seen for an appointment in the last year OR does the patient have an upcoming appointment? Yes  Can we respond through MyChart? No  Agent: Please be advised that Rx refills may take up to 3 business days. We ask that you follow-up with your pharmacy.

## 2024-01-27 ENCOUNTER — Ambulatory Visit: Payer: Self-pay | Admitting: *Deleted

## 2024-01-27 NOTE — Telephone Encounter (Signed)
  Chief Complaint: I returned her call.   She was asking why she got this medication (Flexeril  10 mg) since she is not falling or having dizziness.  It is listed on the bottle.  I let her know that was the muscle relaxer she had requested.  Dr. Malissa Se had those listed as precautions when taking the medicine that it can cause dizziness and falling so to be careful.   Not that she is falling and having dizziness.   She verbalized understanding.  "I guess I read it the wrong way".   "I knew I wasn't falling and having dizziness!" Symptoms: N/A Frequency: N/A Pertinent Negatives: Patient denies N/A Disposition: [] ED /[] Urgent Care (no appt availability in office) / [] Appointment(In office/virtual)/ []  [facility]/ [x] Home Care/ [] Refused Recommended Disposition /[] [facility]/ []  Follow-up with PCP Additional Notes: Answered her question regarding the Flexeril  10 mg.   No further questions.
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
## 2024-02-06 ENCOUNTER — Other Ambulatory Visit: Payer: Self-pay | Admitting: Family Medicine

## 2024-02-07 NOTE — Telephone Encounter (Signed)
 Name of Medication: Ativan  Name of Pharmacy: [facility] (? If needs to be 90 day Rx) Last Fill or Written Date and Quantity: 10/14/23 #30 tabs/ 2 refills  Last Office Visit and Type: CPE 05/10/23 Next Office Visit and Type: CPE 05/14/24

## 2024-02-14 ENCOUNTER — Ambulatory Visit

## 2024-02-14 ENCOUNTER — Other Ambulatory Visit: Payer: Self-pay | Admitting: Family Medicine

## 2024-02-21 ENCOUNTER — Ambulatory Visit (INDEPENDENT_AMBULATORY_CARE_PROVIDER_SITE_OTHER)

## 2024-02-21 DIAGNOSIS — E538 Deficiency of other specified B group vitamins: Secondary | ICD-10-CM

## 2024-02-21 MED ORDER — CYANOCOBALAMIN 1000 MCG/ML IJ SOLN
1000.0000 ug | Freq: Once | INTRAMUSCULAR | Status: AC
  Administered 2024-02-21: 1000 ug via INTRAMUSCULAR

## 2024-02-21 NOTE — Progress Notes (Signed)
 Per orders of Dr. Roxy Manns, injection of vitamin b 12 given by Lewanda Rife in right deltoid. Patient tolerated injection well. Patient will make appointment for 1 month.

## 2024-02-23 ENCOUNTER — Other Ambulatory Visit: Payer: Self-pay | Admitting: Family Medicine

## 2024-02-23 ENCOUNTER — Ambulatory Visit: Payer: Medicare HMO

## 2024-02-23 VITALS — Ht 65.0 in | Wt 102.0 lb

## 2024-02-23 DIAGNOSIS — I10 Essential (primary) hypertension: Secondary | ICD-10-CM

## 2024-02-23 DIAGNOSIS — Z0000 Encounter for general adult medical examination without abnormal findings: Secondary | ICD-10-CM
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
## 2024-02-23 NOTE — Patient Instructions (Signed)
 [name] , Thank you for taking time out of your busy schedule to complete your Annual Wellness Visit with me. I enjoyed our conversation and look forward to speaking with you again next year. I, as well as your care team,  appreciate your ongoing commitment to your health goals. Please review the following plan we discussed and let me know if I can assist you in the future. Your Game plan/ To Do List  Follow up Visits: Next Medicare AWV with our clinical staff: 02/26/25 @ 8:10am televisit   Have you seen your provider in the last 6 months (3 months if uncontrolled diabetes)? No Next Office Visit with your provider: 05/14/24  Clinician Recommendations:  Aim for 30 minutes of exercise or brisk walking, 6-8 glasses of water, and 5 servings of fruits and vegetables each day.       This is a list of the screening recommended for you and due dates:  Health Maintenance  Topic Date Due   Zoster (Shingles) Vaccine (1 of 2) Never done   DTaP/Tdap/Td vaccine (2 - Tdap) 11/09/2017   Mammogram  05/09/2024*   COVID-19 Vaccine (1) 04/30/2025*   Flu Shot  04/06/2024   Medicare Annual Wellness Visit  02/22/2025   Pneumococcal Vaccine for age over 67  Completed   DEXA scan (bone density measurement)  Completed   Hepatitis C Screening  Completed   HPV Vaccine  Aged Out   Meningitis B Vaccine  Aged Out   Colon Cancer Screening  Discontinued  *Topic was postponed. The date shown is not the original due date.    Advanced directives: (Declined) Advance directive discussed with you today. Even though you declined this today, please call our office should you change your mind, and we can give you the proper paperwork for you to fill out. Advance Care Planning is important because it:  [x]  Makes sure you receive the medical care that is consistent with your values, goals, and preferences  [x]  It provides guidance to your family and loved ones and reduces their decisional burden about whether or not they are  making the right decisions based on your wishes.  Follow the link provided in your after visit summary or read over the paperwork we have mailed to you to help you started getting your Advance Directives in place. If you need assistance in completing these, please reach out to us  so that we can help you!
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
## 2024-02-28 MED ORDER — BLOOD PRESSURE CUFF MISC: 0 refills | Status: AC

## 2024-02-28 NOTE — Telephone Encounter (Signed)
 Pt notified of Dr. Graham comments and BP cuff sent to local pharmacy

## 2024-03-05 ENCOUNTER — Ambulatory Visit: Payer: Self-pay | Admitting: Family Medicine

## 2024-03-05 ENCOUNTER — Ambulatory Visit (INDEPENDENT_AMBULATORY_CARE_PROVIDER_SITE_OTHER): Admitting: Family Medicine

## 2024-03-05 ENCOUNTER — Encounter: Payer: Self-pay | Admitting: Family Medicine

## 2024-03-05 ENCOUNTER — Ambulatory Visit (INDEPENDENT_AMBULATORY_CARE_PROVIDER_SITE_OTHER)
Admission: RE | Admit: 2024-03-05 | Discharge: 2024-03-05 | Disposition: A | Discharge status: Home / Self Care | Source: Ambulatory Visit | Attending: Family Medicine | Admitting: Family Medicine

## 2024-03-05 VITALS — BP 123/70 | HR 85 | Temp 98.2°F | Ht 65.0 in | Wt 104.5 lb

## 2024-03-05 DIAGNOSIS — I10 Essential (primary) hypertension: Secondary | ICD-10-CM

## 2024-03-05 DIAGNOSIS — L989 Disorder of the skin and subcutaneous tissue, unspecified: Secondary | ICD-10-CM | POA: Insufficient documentation

## 2024-03-05 DIAGNOSIS — R052 Subacute cough: Secondary | ICD-10-CM

## 2024-03-05 DIAGNOSIS — E890 Postprocedural hypothyroidism: Secondary | ICD-10-CM | POA: Diagnosis not present

## 2024-03-05 DIAGNOSIS — J449 Chronic obstructive pulmonary disease, unspecified: Secondary | ICD-10-CM | POA: Diagnosis not present

## 2024-03-05 DIAGNOSIS — Z87891 Personal history of nicotine dependence: Secondary | ICD-10-CM | POA: Diagnosis not present

## 2024-03-05 DIAGNOSIS — K5903 Drug induced constipation: Secondary | ICD-10-CM | POA: Diagnosis not present

## 2024-03-05 DIAGNOSIS — R059 Cough, unspecified: Secondary | ICD-10-CM | POA: Diagnosis not present

## 2024-03-05 DIAGNOSIS — I700 Atherosclerosis of aorta: Secondary | ICD-10-CM | POA: Diagnosis not present

## 2024-03-05 LAB — COMPREHENSIVE METABOLIC PANEL WITH GFR
ALT: 13 U/L (ref 0–35)
AST: 19 U/L (ref 0–37)
Albumin: 4.1 g/dL (ref 3.5–5.2)
Alkaline Phosphatase: 64 U/L (ref 39–117)
BUN: 15 mg/dL (ref 6–23)
CO2: 36 meq/L — ABNORMAL HIGH (ref 19–32)
Calcium: 9.4 mg/dL (ref 8.4–10.5)
Chloride: 100 meq/L (ref 96–112)
Creatinine, Ser: 0.93 mg/dL (ref 0.40–1.20)
GFR: 58.88 mL/min — ABNORMAL LOW (ref >60.00)
Glucose, Bld: 71 mg/dL (ref 70–99)
Potassium: 4.1 meq/L (ref 3.5–5.1)
Sodium: 142 meq/L (ref 135–145)
Total Bilirubin: 0.4 mg/dL (ref 0.2–1.2)
Total Protein: 6.3 g/dL (ref 6.0–8.3)

## 2024-03-05 LAB — CBC WITH DIFFERENTIAL/PLATELET
Basophils Absolute: 0 10*3/uL (ref 0.0–0.1)
Basophils Relative: 0.7 % (ref 0.0–3.0)
Eosinophils Absolute: 0.3 10*3/uL (ref 0.0–0.7)
Eosinophils Relative: 5.2 % — ABNORMAL HIGH (ref 0.0–5.0)
HCT: 40.2 % (ref 36.0–46.0)
Hemoglobin: 13.3 g/dL (ref 12.0–15.0)
Lymphocytes Relative: 34.5 % (ref 12.0–46.0)
Lymphs Abs: 1.9 10*3/uL (ref 0.7–4.0)
MCHC: 33.1 g/dL (ref 30.0–36.0)
MCV: 92.4 fl (ref 78.0–100.0)
Monocytes Absolute: 0.5 10*3/uL (ref 0.1–1.0)
Monocytes Relative: 8.6 % (ref 3.0–12.0)
Neutro Abs: 2.9 10*3/uL (ref 1.4–7.7)
Neutrophils Relative %: 51 % (ref 43.0–77.0)
Platelets: 278 10*3/uL (ref 150.0–400.0)
RBC: 4.35 Mil/uL (ref 3.87–5.11)
RDW: 14.8 % (ref 11.5–15.5)
WBC: 5.6 10*3/uL (ref 4.0–10.5)

## 2024-03-05 LAB — TSH: TSH: 9.91 u[IU]/mL — ABNORMAL HIGH (ref 0.35–5.50)

## 2024-03-05 MED ORDER — PREDNISONE 20 MG PO TABS: 20.0000 mg | ORAL_TABLET | Freq: Every day | ORAL | 0 refills | Status: DC

## 2024-03-05 NOTE — Patient Instructions (Addendum)
 For cough  Chest xray today  We will reach out with the result and plan Keep using your albuterol  inhaler as needed for wheezing  You can try some delsym  over the counter cough (store brand is fine)   For constipation - make sure you drink enough fluids  Prunes are good  Over the counter miralax is very good - takes 3-4 days to work- start that daily  If not improved let us  know    Let's re check thyroid  today with some chemistry labs   I want you to see dermatologist for the mole on your arm   I put the referral in for dermatology Please let us  know if you don't hear in 1-2 weeks to set that up You can also call the office and make an appointment
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
## 2024-03-05 NOTE — Assessment & Plan Note (Signed)
 Worse the past several months Bm every 3 d  Causing some more low abd cramping than usual   Discussed diet  Encouraged more fluids High fiber foods and prunes if tolerated Recommend trial of miralax over the counter daily or to effect   Update if not starting to improve in a week or if worsening  Call back and Er precautions noted in detail today
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
## 2024-03-05 NOTE — Assessment & Plan Note (Signed)
 TSH today  Was in normal range in sept 3.36 on levothyroxine  75 mcg daily   More constipated lately

## 2024-03-05 NOTE — Assessment & Plan Note (Addendum)
 For 1 month since uri  Other symptoms improved Pt notes cough is dry  Also wheezing (in setting of likely copd- reviewed last cxr)  Uses albuterol    Today's exam is reassuring/ no wheezing (did use albuterol  this am)   Cxr notes changes of copd/chronic bronchitis, no infiltrate  Will try prednisone  20 mg daily for 5 days (reviewed side effects)  Can try delsym over the counter for cough (in past benzonatate  does not help)  Suspect post viral with reactive airways and copd as cause   Update if not starting to improve in a week or if worsening  Call back and Er precautions noted in detail today

## 2024-03-05 NOTE — Assessment & Plan Note (Signed)
 Left upper arm Keratotic growth resembling skin tag with warty properties Will need removal  She sees derm at [facility]- referral done

## 2024-03-06 MED ORDER — LEVOTHYROXINE SODIUM 100 MCG PO TABS
100.0000 ug | ORAL_TABLET | Freq: Every day | ORAL | 0 refills | Status: DC
Start: 2024-03-06 — End: 2024-05-30

## 2024-03-06 NOTE — Telephone Encounter (Signed)
 Copied from CRM [id number]. Topic: Clinical - Lab/Test Results >> Mar 06, 2024  8:15 AM Robinson H wrote: Reason for CRM: Patient is retuning call to office regarding labs  Liz [phone]

## 2024-03-16 ENCOUNTER — Ambulatory Visit: Payer: Self-pay

## 2024-03-16 NOTE — Telephone Encounter (Signed)
 Appt scheduled with Dr. Avelina on Tuesday 03/20/24, will route to her and PCP as a FYI

## 2024-03-16 NOTE — Telephone Encounter (Signed)
 I am out of the office/ out of town Thanks for seeing her

## 2024-03-16 NOTE — Telephone Encounter (Signed)
 FYI Only or Action Required?: FYI only for provider.  Patient was last seen in primary care on 03/05/2024 by Randeen Laine LABOR, MD.  Called Nurse Triage reporting Hand Pain.  Symptoms began a week ago.  Interventions attempted: Nothing.  Symptoms are: stable.  Triage Disposition: See PCP When Office is Open (Within 3 Days)  Patient/caregiver understands and will follow disposition?: Yes  Appt scheduled for 03/20/24 with Amy [id number].   Copied from CRM [id number]. Topic: Clinical - Red Word Triage >> Mar 16, 2024 11:18 AM Charolett L wrote: [facility] that prompted transfer to Nurse Triage: Bad Cramps in fingers/legs which leaves them sore Sometimes shes unable to open her hands Reason for Disposition  [1] MODERATE pain (e.g., interferes with normal activities) AND [2] present > 3 days  Answer Assessment - Initial Assessment Questions 1. ONSET: When did the pain start?     1 week  2. LOCATION: Where is the pain located?     Both hands all fingers  3. PAIN: How bad is the pain? (Scale 1-10; or mild, moderate, severe)     Comes and goes  6. AGGRAVATING FACTORS: What makes the pain worse? (e.g., using computer)      7. OTHER SYMPTOMS: Do you have any other symptoms? (e.g., fever, neck pain, numbness or tingling, rash, swelling)     Leg cramps  Protocols used: Hand Pain-A-AH

## 2024-03-19 DIAGNOSIS — H43813 Vitreous degeneration, bilateral: Secondary | ICD-10-CM | POA: Diagnosis not present

## 2024-03-19 DIAGNOSIS — Z0100 Encounter for examination of eyes and vision without abnormal findings: Secondary | ICD-10-CM | POA: Diagnosis not present

## 2024-03-19 DIAGNOSIS — H353131 Nonexudative age-related macular degeneration, bilateral, early dry stage: Secondary | ICD-10-CM | POA: Diagnosis not present

## 2024-03-19 DIAGNOSIS — H353221 Exudative age-related macular degeneration, left eye, with active choroidal neovascularization: Secondary | ICD-10-CM | POA: Diagnosis not present

## 2024-03-19 DIAGNOSIS — H26491 Other secondary cataract, right eye: Secondary | ICD-10-CM | POA: Diagnosis not present

## 2024-03-20 ENCOUNTER — Encounter: Payer: Self-pay | Admitting: Family Medicine

## 2024-03-20 ENCOUNTER — Ambulatory Visit: Payer: Self-pay | Admitting: Family Medicine

## 2024-03-20 ENCOUNTER — Ambulatory Visit (INDEPENDENT_AMBULATORY_CARE_PROVIDER_SITE_OTHER): Admitting: Family Medicine

## 2024-03-20 VITALS — BP 110/60 | HR 91 | Temp 98.2°F | Ht 65.0 in | Wt 102.4 lb

## 2024-03-20 DIAGNOSIS — R252 Cramp and spasm: Secondary | ICD-10-CM | POA: Insufficient documentation

## 2024-03-20 LAB — CK: Total CK: 144 U/L (ref 17–177)

## 2024-03-20 LAB — MAGNESIUM: Magnesium: 1.7 mg/dL (ref 1.5–2.5)

## 2024-03-20 LAB — SEDIMENTATION RATE: Sed Rate: 5 mm/h (ref 0–30)

## 2024-03-20 LAB — VITAMIN D 25 HYDROXY (VIT D DEFICIENCY, FRACTURES): VITD: 42.21 ng/mL (ref 30.00–100.00)

## 2024-03-20 MED ORDER — CYCLOBENZAPRINE HCL 10 MG PO TABS: ORAL_TABLET | ORAL | 0 refills | Status: DC

## 2024-03-20 NOTE — Patient Instructions (Signed)
 Start multivitamin.  Continue the high dos eof thyroid  medicine.  Increase water to  around 6 12 oz per day.  Can use muscle rlaxant at night as needed for muscle spasm/cramping.

## 2024-03-20 NOTE — Progress Notes (Signed)
 Patient ID: [name] [name], female    DOB: [birth year], 78 y.o.   MRN: [id number]  This visit was conducted in person.  BP 110/60   Pulse 91   Temp 98.2 F (36.8 C) (Temporal)   Ht 5' 5 (1.651 m)   Wt 102 lb 6 oz (46.4 kg)   SpO2 94%   BMI 17.04 kg/m    CC:  Chief Complaint  Patient presents with   Muscle Cramps    Calves and Hands    Subjective:   HPI: [name] [name] is a 78 y.o. female presenting on 03/20/2024 for Muscle Cramps (Calves and Hands)  [facility]  New onset cramping calves and hands on going x few weeks. Day and night.  No new meds.  Drinks water 4 x 12 oz bottles per day.  No diarrhea, vomiting.  She has good appetite.[name] [name] losing some weight in  last few months.  No multi- vitamin.  Chronic back problems... no new changes.   Wt Readings from Last 3 Encounters:  03/20/24 102 lb 6 oz (46.4 kg)  03/05/24 104 lb 8 oz (47.4 kg)  02/23/24 102 lb (46.3 kg)     On Zetia .   6/30 TSH 9.. recent increase in levo to 100 mcg.   CBC, CMET  Relevant past medical, surgical, family and social history reviewed and updated as indicated. Interim medical history since our last visit reviewed. Allergies and medications reviewed and updated. Outpatient Medications Prior to Visit  Medication Sig Dispense Refill   albuterol  (VENTOLIN  HFA) 108 (90 Base) MCG/ACT inhaler Inhale 1-2 puffs into the lungs every 4 (four) hours as needed for wheezing or shortness of breath. 1 each 3   amLODipine  (NORVASC ) 5 MG tablet Take 1 tablet (5 mg total) by mouth daily. 90 tablet 2   atorvastatin  (LIPITOR) 80 MG tablet Take 1 tablet (80 mg total) by mouth daily. 90 tablet 2   Blood Pressure Monitoring (BLOOD PRESSURE CUFF) MISC To check blood pressure daily and prn 1 each 0   busPIRone  (BUSPAR ) 15 MG tablet TAKE 1/2 TABLET TWICE DAILY 90 tablet 0   Calcium  Carbonate-Vit D-Min (CALCIUM  1200 PO) Take 1 tablet by mouth daily.      chlorthalidone  (HYGROTON ) 25 MG tablet Take 0.5 tablets (12.5 mg  total) by mouth daily. 45 tablet 2   cyanocobalamin  (VITAMIN B12) 1000 MCG/ML injection Inject 1,000 mcg into the muscle every 30 (thirty) days.     ezetimibe  (ZETIA ) 10 MG tablet Take 1 tablet (10 mg total) by mouth daily. 90 tablet 2   famotidine  (PEPCID ) 20 MG tablet TAKE 2 TABLETS TWICE DAILY 360 tablet 0   levothyroxine  (SYNTHROID ) 100 MCG tablet Take 1 tablet (100 mcg total) by mouth daily. 90 tablet 0   lidocaine  (XYLOCAINE ) 2 % solution Use as directed 15 mLs in the mouth or throat as needed for mouth pain. 100 mL 0   LORazepam  (ATIVAN ) 1 MG tablet TAKE 1 TABLET AT BEDTIME AS NEEDED FOR SLEEP 30 tablet 2   losartan  (COZAAR ) 50 MG tablet Take 1 tablet (50 mg total) by mouth daily. 90 tablet 2   nortriptyline  (PAMELOR ) 75 MG capsule Take 1 capsule (75 mg total) by mouth at bedtime. 90 capsule 2   predniSONE  (DELTASONE ) 20 MG tablet Take 1 tablet (20 mg total) by mouth daily with breakfast. 5 tablet 0   PROLIA  60 MG/ML SOSY injection Inject 60 mg into the skin every 6 (six) months.     traZODone  (  DESYREL ) 50 MG tablet TAKE 1/2 TO 1 TABLET (25 TO 50MG  TOTAL) AT BEDTIME AS NEEDED FOR SLEEP. 90 tablet 2   cyclobenzaprine  (FLEXERIL ) 10 MG tablet TAKE 1 TABLET AT BEDTIME AS NEEDED, caution for sedation/dizziness and falls 90 tablet 0   No facility-administered medications prior to visit.     Per HPI unless specifically indicated in ROS section below Review of Systems  Constitutional:  Negative for fatigue and fever.  HENT:  Negative for congestion.   Eyes:  Negative for pain.  Respiratory:  Negative for cough and shortness of breath.   Cardiovascular:  Negative for chest pain, palpitations and leg swelling.  Gastrointestinal:  Negative for abdominal pain.  Genitourinary:  Negative for dysuria and vaginal bleeding.  Musculoskeletal:  Negative for back pain.  Neurological:  Negative for syncope, light-headedness and headaches.  Psychiatric/Behavioral:  Negative for dysphoric mood.     Objective:  BP 110/60   Pulse 91   Temp 98.2 F (36.8 C) (Temporal)   Ht 5' 5 (1.651 m)   Wt 102 lb 6 oz (46.4 kg)   SpO2 94%   BMI 17.04 kg/m   Wt Readings from Last 3 Encounters:  03/20/24 102 lb 6 oz (46.4 kg)  03/05/24 104 lb 8 oz (47.4 kg)  02/23/24 102 lb (46.3 kg)      Physical Exam Constitutional:      General: She is not in acute distress.    Appearance: Normal appearance. She is well-developed. She is cachectic. She is not ill-appearing or toxic-appearing.  HENT:     Head: Normocephalic.     Right Ear: Hearing, tympanic membrane, ear canal and external ear normal. Tympanic membrane is not erythematous, retracted or bulging.     Left Ear: Hearing, tympanic membrane, ear canal and external ear normal. Tympanic membrane is not erythematous, retracted or bulging.     Nose: No mucosal edema or rhinorrhea.     Right Sinus: No maxillary sinus tenderness or frontal sinus tenderness.     Left Sinus: No maxillary sinus tenderness or frontal sinus tenderness.     Mouth/Throat:     Pharynx: Uvula midline.  Eyes:     General: Lids are normal. Lids are everted, no foreign bodies appreciated.     Conjunctiva/sclera: Conjunctivae normal.     Pupils: Pupils are equal, round, and reactive to light.  Neck:     Thyroid : No thyroid  mass or thyromegaly.     Vascular: No carotid bruit.     Trachea: Trachea normal.  Cardiovascular:     Rate and Rhythm: Normal rate and regular rhythm.     Pulses: Normal pulses.     Heart sounds: Normal heart sounds, S1 normal and S2 normal. No murmur heard.    No friction rub. No gallop.  Pulmonary:     Effort: Pulmonary effort is normal. No tachypnea or respiratory distress.     Breath sounds: Normal breath sounds. No decreased breath sounds, wheezing, rhonchi or rales.  Abdominal:     General: Bowel sounds are normal.     Palpations: Abdomen is soft.     Tenderness: There is no abdominal tenderness.  Musculoskeletal:     Cervical back:  Normal range of motion and neck supple.  Skin:    General: Skin is warm and dry.     Findings: No rash.  Neurological:     Mental Status: She is alert.  Psychiatric:        Mood and Affect: Mood is not anxious  or depressed.        Speech: Speech normal.        Behavior: Behavior normal. Behavior is cooperative.        Thought Content: Thought content normal.        Judgment: Judgment normal.       Results for orders placed or performed in visit on 03/05/24  Comprehensive metabolic panel with GFR   Collection Time: 03/05/24 10:58 AM  Result Value Ref Range   Sodium 142 135 - 145 mEq/L   Potassium 4.1 3.5 - 5.1 mEq/L   Chloride 100 96 - 112 mEq/L   CO2 36 (H) 19 - 32 mEq/L   Glucose, Bld 71 70 - 99 mg/dL   BUN 15 6 - 23 mg/dL   Creatinine, Ser 9.06 0.40 - 1.20 mg/dL   Total Bilirubin 0.4 0.2 - 1.2 mg/dL   Alkaline Phosphatase 64 39 - 117 U/L   AST 19 0 - 37 U/L   ALT 13 0 - 35 U/L   Total Protein 6.3 6.0 - 8.3 g/dL   Albumin 4.1 3.5 - 5.2 g/dL   GFR 41.11 (L) >39.99 mL/min   Calcium  9.4 8.4 - 10.5 mg/dL  CBC with Differential/Platelet   Collection Time: 03/05/24 10:58 AM  Result Value Ref Range   WBC 5.6 4.0 - 10.5 K/uL   RBC 4.35 3.87 - 5.11 Mil/uL   Hemoglobin 13.3 12.0 - 15.0 g/dL   HCT 59.7 63.9 - 53.9 %   MCV 92.4 78.0 - 100.0 fl   MCHC 33.1 30.0 - 36.0 g/dL   RDW 85.1 88.4 - 84.4 %   Platelets 278.0 150.0 - 400.0 K/uL   Neutrophils Relative % 51.0 43.0 - 77.0 %   Lymphocytes Relative 34.5 12.0 - 46.0 %   Monocytes Relative 8.6 3.0 - 12.0 %   Eosinophils Relative 5.2 (H) 0.0 - 5.0 %   Basophils Relative 0.7 0.0 - 3.0 %   Neutro Abs 2.9 1.4 - 7.7 K/uL   Lymphs Abs 1.9 0.7 - 4.0 K/uL   Monocytes Absolute 0.5 0.1 - 1.0 K/uL   Eosinophils Absolute 0.3 0.0 - 0.7 K/uL   Basophils Absolute 0.0 0.0 - 0.1 K/uL  TSH   Collection Time: 03/05/24 10:58 AM  Result Value Ref Range   TSH 9.91 (H) 0.35 - 5.50 uIU/mL    Assessment and Plan  Leg cramping Assessment &  Plan: Acute, possibly multifactorial.  Recent elevated TSH with adjusted levothyroxine  dose.  She has only been on this higher dose for the past 3 to 4 days.  More time at corrected dose may improve symptoms. She also does not get adequate water and I have encouraged her to increase this.  We will check for additional secondary causes.  Encouraged her to take a multivitamin without iron. No clear red flags or clear back pain source although she does have chronic low back pain. No clear medication side effect.  Return and ER precautions provided   Orders: -     Sedimentation rate -     CK -     Magnesium  -     VITAMIN D  25 Hydroxy (Vit-D Deficiency, Fractures)  Other orders -     Cyclobenzaprine  HCl; TAKE 1/1 to 1 TABLET AT BEDTIME AS NEEDED, caution for sedation/dizziness and fallsTAKE 1 TABLET AT BEDTIME AS NEEDED, caution for sedation/dizziness and falls  Dispense: 30 tablet; Refill: 0    Return if symptoms worsen or fail to improve.   Olvin Rohr,  MD

## 2024-03-20 NOTE — Assessment & Plan Note (Signed)
 Acute, possibly multifactorial.  Recent elevated TSH with adjusted levothyroxine  dose.  She has only been on this higher dose for the past 3 to 4 days.  More time at corrected dose may improve symptoms. She also does not get adequate water and I have encouraged her to increase this.  We will check for additional secondary causes.  Encouraged her to take a multivitamin without iron. No clear red flags or clear back pain source although she does have chronic low back pain. No clear medication side effect.  Return and ER precautions provided

## 2024-03-27 ENCOUNTER — Ambulatory Visit

## 2024-04-11 ENCOUNTER — Other Ambulatory Visit: Payer: Self-pay | Admitting: Family Medicine

## 2024-04-17 ENCOUNTER — Other Ambulatory Visit: Payer: Self-pay | Admitting: Family Medicine

## 2024-04-17 NOTE — Telephone Encounter (Signed)
 Last filled by Dr. Avelina on 03/20/24 #30 tab/ 0 refills   CPE scheduled 05/14/24

## 2024-04-24 ENCOUNTER — Ambulatory Visit

## 2024-05-07 ENCOUNTER — Telehealth: Payer: Self-pay | Admitting: Family Medicine

## 2024-05-07 DIAGNOSIS — M8000XS Age-related osteoporosis with current pathological fracture, unspecified site, sequela: Secondary | ICD-10-CM

## 2024-05-07 DIAGNOSIS — E7800 Pure hypercholesterolemia, unspecified: Secondary | ICD-10-CM

## 2024-05-07 DIAGNOSIS — R7303 Prediabetes: Secondary | ICD-10-CM

## 2024-05-07 DIAGNOSIS — E890 Postprocedural hypothyroidism: Secondary | ICD-10-CM

## 2024-05-07 DIAGNOSIS — I10 Essential (primary) hypertension: Secondary | ICD-10-CM

## 2024-05-07 DIAGNOSIS — E538 Deficiency of other specified B group vitamins: Secondary | ICD-10-CM

## 2024-05-07 NOTE — Telephone Encounter (Signed)
-----   Message from Veva JINNY Ferrari sent at 04/24/2024  2:59 PM EDT ----- Regarding: Lab orders for Tue, 9.2.25 Patient is scheduled for CPX labs, please order future labs, Thanks , Veva

## 2024-05-08 ENCOUNTER — Other Ambulatory Visit: Payer: Medicare HMO

## 2024-05-14 ENCOUNTER — Encounter: Payer: Medicare HMO | Admitting: Family Medicine

## 2024-05-18 ENCOUNTER — Other Ambulatory Visit: Payer: Self-pay | Admitting: Family Medicine

## 2024-05-18 NOTE — Telephone Encounter (Signed)
 Name of Medication: Ativan  Name of Pharmacy: [facility] Last Fill or Written Date and Quantity: 02/07/24 #30 tabs/ 2 refills  Last Office Visit and Type: CPE 05/10/23 Next Office Visit and Type: pt no showed her CPE on 05/14/24

## 2024-05-18 NOTE — Telephone Encounter (Signed)
 Please re schedule annual exam since she missed hers

## 2024-05-21 NOTE — Telephone Encounter (Signed)
 lvm for pt to call office to schedule appt.

## 2024-05-29 ENCOUNTER — Ambulatory Visit (INDEPENDENT_AMBULATORY_CARE_PROVIDER_SITE_OTHER): Admitting: Family Medicine

## 2024-05-29 ENCOUNTER — Ambulatory Visit: Payer: Self-pay | Admitting: Family Medicine

## 2024-05-29 ENCOUNTER — Encounter: Payer: Self-pay | Admitting: Family Medicine

## 2024-05-29 VITALS — BP 136/68 | HR 71 | Temp 97.8°F | Ht 62.5 in | Wt 105.0 lb

## 2024-05-29 DIAGNOSIS — E890 Postprocedural hypothyroidism: Secondary | ICD-10-CM

## 2024-05-29 DIAGNOSIS — E7800 Pure hypercholesterolemia, unspecified: Secondary | ICD-10-CM

## 2024-05-29 DIAGNOSIS — I10 Essential (primary) hypertension: Secondary | ICD-10-CM

## 2024-05-29 DIAGNOSIS — Z23 Encounter for immunization: Secondary | ICD-10-CM | POA: Diagnosis not present

## 2024-05-29 DIAGNOSIS — M8000XS Age-related osteoporosis with current pathological fracture, unspecified site, sequela: Secondary | ICD-10-CM

## 2024-05-29 DIAGNOSIS — K219 Gastro-esophageal reflux disease without esophagitis: Secondary | ICD-10-CM | POA: Diagnosis not present

## 2024-05-29 DIAGNOSIS — Z8659 Personal history of other mental and behavioral disorders: Secondary | ICD-10-CM

## 2024-05-29 DIAGNOSIS — R1030 Lower abdominal pain, unspecified: Secondary | ICD-10-CM | POA: Diagnosis not present

## 2024-05-29 DIAGNOSIS — R7303 Prediabetes: Secondary | ICD-10-CM | POA: Diagnosis not present

## 2024-05-29 DIAGNOSIS — E538 Deficiency of other specified B group vitamins: Secondary | ICD-10-CM

## 2024-05-29 DIAGNOSIS — E119 Type 2 diabetes mellitus without complications: Secondary | ICD-10-CM

## 2024-05-29 LAB — CBC WITH DIFFERENTIAL/PLATELET
Basophils Absolute: 0 K/uL (ref 0.0–0.1)
Basophils Relative: 0.9 % (ref 0.0–3.0)
Eosinophils Absolute: 0.1 K/uL (ref 0.0–0.7)
Eosinophils Relative: 2.9 % (ref 0.0–5.0)
HCT: 39 % (ref 36.0–46.0)
Hemoglobin: 13 g/dL (ref 12.0–15.0)
Lymphocytes Relative: 36 % (ref 12.0–46.0)
Lymphs Abs: 1.4 K/uL (ref 0.7–4.0)
MCHC: 33.3 g/dL (ref 30.0–36.0)
MCV: 92.1 fl (ref 78.0–100.0)
Monocytes Absolute: 0.3 K/uL (ref 0.1–1.0)
Monocytes Relative: 8.3 % (ref 3.0–12.0)
Neutro Abs: 2 K/uL (ref 1.4–7.7)
Neutrophils Relative %: 51.9 % (ref 43.0–77.0)
Platelets: 252 K/uL (ref 150.0–400.0)
RBC: 4.23 Mil/uL (ref 3.87–5.11)
RDW: 15.8 % — ABNORMAL HIGH (ref 11.5–15.5)
WBC: 3.8 K/uL — ABNORMAL LOW (ref 4.0–10.5)

## 2024-05-29 LAB — COMPREHENSIVE METABOLIC PANEL WITH GFR
ALT: 12 U/L (ref 0–35)
AST: 18 U/L (ref 0–37)
Albumin: 4 g/dL (ref 3.5–5.2)
Alkaline Phosphatase: 82 U/L (ref 39–117)
BUN: 14 mg/dL (ref 6–23)
CO2: 30 meq/L (ref 19–32)
Calcium: 9.3 mg/dL (ref 8.4–10.5)
Chloride: 103 meq/L (ref 96–112)
Creatinine, Ser: 0.9 mg/dL (ref 0.40–1.20)
GFR: 61.15 mL/min (ref >60.00)
Glucose, Bld: 99 mg/dL (ref 70–99)
Potassium: 3.6 meq/L (ref 3.5–5.1)
Sodium: 142 meq/L (ref 135–145)
Total Bilirubin: 0.7 mg/dL (ref 0.2–1.2)
Total Protein: 6.1 g/dL (ref 6.0–8.3)

## 2024-05-29 LAB — LIPID PANEL
Cholesterol: 178 mg/dL (ref 0–200)
HDL: 70.4 mg/dL (ref >39.00)
LDL Cholesterol: 94 mg/dL (ref 0–99)
NonHDL: 107.65
Total CHOL/HDL Ratio: 3
Triglycerides: 67 mg/dL (ref 0.0–149.0)
VLDL: 13.4 mg/dL (ref 0.0–40.0)

## 2024-05-29 LAB — HEMOGLOBIN A1C: Hgb A1c MFr Bld: 6.6 % — ABNORMAL HIGH (ref 4.6–6.5)

## 2024-05-29 LAB — TSH: TSH: 16.53 u[IU]/mL — ABNORMAL HIGH (ref 0.35–5.50)

## 2024-05-29 LAB — VITAMIN B12: Vitamin B-12: 226 pg/mL (ref 211–911)

## 2024-05-29 MED ORDER — NORTRIPTYLINE HCL 75 MG PO CAPS: 75.0000 mg | ORAL_CAPSULE | Freq: Every day | ORAL | 2 refills | Status: AC

## 2024-05-29 NOTE — Assessment & Plan Note (Signed)
Disc goals for lipids and reasons to control them Rev last labs with pt Rev low sat fat diet in detail   Lab today  Continues atorvastatin 80 mg daily and zetia 10 mg daily

## 2024-05-29 NOTE — Assessment & Plan Note (Signed)
 B12 shot monthly  Lab today  No clinical changes

## 2024-05-29 NOTE — Assessment & Plan Note (Signed)
 TSH today Now taking 100 mcg levothyroxine  daily Feels better at this dose

## 2024-05-29 NOTE — Progress Notes (Signed)
 Subjective:    Patient ID: [name] [name], female    DOB: 1945/11/06, 78 y.o.   MRN: [id number]  HPI  Wt Readings from Last 3 Encounters:  05/29/24 105 lb (47.6 kg)  03/20/24 102 lb 6 oz (46.4 kg)  03/05/24 104 lb 8 oz (47.4 kg)   18.90 kg/m  Vitals:   05/29/24 0824  BP: 136/68  Pulse: 71  Temp: 97.8 F (36.6 C)  SpO2: 93%    Pt presents for follow up of chronic medical problems including HTN Hypothyroidism Hyperlipidemia Low B12  Prediabetes Flu shot due  Feels ok today     HTN bp is stable today  No cp or palpitations or headaches or edema  No side effects to medicines  BP Readings from Last 3 Encounters:  05/29/24 136/68  03/20/24 110/60  03/05/24 123/70   Chlorthalidone  15 mg daily  Losartan  50 mg daily  Amlodipine  5 mg daily    Lab Results  Component Value Date   NA 142 03/05/2024   K 4.1 03/05/2024   CO2 36 (H) 03/05/2024   GLUCOSE 71 03/05/2024   BUN 15 03/05/2024   CREATININE 0.93 03/05/2024   CALCIUM  9.4 03/05/2024   GFR 58.88 (L) 03/05/2024   GFRNONAA >60 05/18/2022   Hypothyroidism  Pt has no clinical changes No change in energy level/ hair or skin/ edema and no tremor Lab Results  Component Value Date   TSH 9.91 (H) 03/05/2024    Levothyroxine  was increased to 100 mcg daily  Due for labs  Does feel better   No side effects to any of her medicines  Hyperlipidemia Lab Results  Component Value Date   CHOL 158 05/10/2023   HDL 63.40 05/10/2023   LDLCALC 82 05/10/2023   LDLDIRECT 140.9 07/04/2013   TRIG 64.0 05/10/2023   CHOLHDL 2 05/10/2023   Atorvastatin  80 mg daily Zetia  10 mg daily   Trying to get protein    Low b12 Lab Results  Component Value Date   VITAMINB12 224 05/10/2023  12 shot monthly   Prediabetes Lab Results  Component Value Date   HGBA1C 6.0 05/10/2023   HGBA1C 6.4 04/12/2022   HGBA1C 5.9 (A) 02/02/2022   Not a lot of sweets  Just once in a while     Osteoporosis Last vitamin D  Lab  Results  Component Value Date   VD25OH 42.21 03/20/2024    Declines mammogram   Osteoporosis  Dexa 04/2022 No falls or fractures since then   Some intermittent crampy pain in lower abdomen  Had colonoscopy 2019 Polyps Wanted to repeat with better prep but pt could not afford that       Patient Active Problem List   Diagnosis Date Noted   Lower abdominal pain 05/29/2024   Leg cramping 03/20/2024   Skin lesion 03/05/2024   Urinary retention 02/24/2023   GERD (gastroesophageal reflux disease) 02/02/2022   Microscopic hematuria 02/02/2022   Low serum vitamin B12 08/07/2020   Prediabetes 03/07/2020   Tingling in extremities 03/07/2020   Neck pain 11/05/2019   Ganglion cyst of tendon sheath of right hand 05/17/2019   Melanosis, colon    Other specified disease of esophagus    Constipation 01/24/2018   Anxiety 01/09/2018   Coronary artery calcification seen on CAT scan 11/25/2017   Special screening for malignant neoplasms, colon 11/01/2017   Estrogen deficiency 09/26/2017   Aortic calcification 11/30/2016   History of patellar fracture 11/30/2016   Routine general medical examination at a health  care facility 06/27/2016   Loss of weight 06/04/2016   Cough 04/26/2016   History of vertebral compression fracture 01/19/2016   Chronic foot pain 10/27/2011   Low back pain 04/14/2011   Herpes simplex virus (HSV) infection 11/17/2010   Essential hypertension 08/18/2009   BUNION 03/04/2009   Allergic rhinitis 01/10/2008   H/O goiter 02/09/2007   Hypothyroidism 02/09/2007   HYPERCHOLESTEROLEMIA 02/09/2007   History of depression 02/09/2007   OSTEOARTHRITIS 02/09/2007   Osteoporosis 02/09/2007   Disturbance in sleep behavior 02/09/2007   Past Medical History:  Diagnosis Date   Aortic calcification    COPD (chronic obstructive pulmonary disease) (HCC)    Depression    Hyperlipidemia    Hypertension    Hypothyroidism    Osteoarthritis    Osteoporosis    Shingles     arm 1/12   Sleep disorder    Tobacco abuse    past; quit 09   Past Surgical History:  Procedure Laterality Date   ABDOMINAL HYSTERECTOMY     BIOPSY THYROID      pos neoplasm, surgery 09/2006   bunions  06/1998   CATARACT EXTRACTION W/PHACO Left 04/20/2022   Procedure: CATARACT EXTRACTION PHACO AND INTRAOCULAR LENS PLACEMENT (IOC) LEFT;  Surgeon: Jaye Fallow, MD;  Location: [facility];  Service: Ophthalmology;  Laterality: Left;  10.24 0058.5   CATARACT EXTRACTION W/PHACO Right 05/04/2022   Procedure: CATARACT EXTRACTION PHACO AND INTRAOCULAR LENS PLACEMENT (IOC) RIGHT;  Surgeon: Jaye Fallow, MD;  Location: [facility];  Service: Ophthalmology;  Laterality: Right;  6.94 00:43.2   COLONOSCOPY WITH PROPOFOL  N/A 03/15/2018   Procedure: COLONOSCOPY WITH PROPOFOL ;  Surgeon: Janalyn Keene NOVAK, MD;  Location: [facility];  Service: Endoscopy;  Laterality: N/A;   CORONARY ANGIOPLASTY     CXR-copd, ts partial compression fracture  12/2006   dexa  12/1996   ESOPHAGOGASTRODUODENOSCOPY (EGD) WITH PROPOFOL  N/A 03/15/2018   Procedure: ESOPHAGOGASTRODUODENOSCOPY (EGD) WITH PROPOFOL ;  Surgeon: Janalyn Keene NOVAK, MD;  Location: [facility];  Service: Endoscopy;  Laterality: N/A;   GANGLION CYST EXCISION Right 05/16/2019   Procedure: REMOVAL GANGLION OF WRIST;  Surgeon: Edie Norleen PARAS, MD;  Location: [facility];  Service: Orthopedics;  Laterality: Right;   hashimoto  02/1997   hyerectomy- cervical ca cells     LEFT HEART CATH AND CORONARY ANGIOGRAPHY Left 12/23/2017   Procedure: LEFT HEART CATH AND CORONARY ANGIOGRAPHY;  Surgeon: Perla Evalene PARAS, MD;  Location: [facility];  Service: Cardiovascular;  Laterality: Left;   left wrist fracture     surgery   right thyroid  lobectomy, goiter, thyroiditis  12/2006   stress fractures foot     THYROIDECTOMY  11/2009   Social History   Tobacco Use   Smoking status: Former    Current packs/day: 0.00    Types: Cigarettes     Quit date: 08/07/2019    Years since quitting: 4.8   Smokeless tobacco: Never  Vaping Use   Vaping status: Never Used  Substance Use Topics   Alcohol use: No    Alcohol/week: 0.0 standard drinks of alcohol   Drug use: No   Family History  Problem Relation Age of Onset   Stroke Father    Hypertension Father    Diabetes Mother    Coronary artery disease Mother    Allergies  Allergen Reactions   Fosamax  [Alendronate  Sodium]     Dysphagia and heartburn    Raloxifene  Hcl Other (See Comments)    Leg pain and cramps  Leg  pain and cramps    Current Outpatient Medications on File Prior to Visit  Medication Sig Dispense Refill   albuterol  (VENTOLIN  HFA) 108 (90 Base) MCG/ACT inhaler Inhale 1-2 puffs into the lungs every 4 (four) hours as needed for wheezing or shortness of breath. 1 each 3   amLODipine  (NORVASC ) 5 MG tablet TAKE 1 TABLET EVERY DAY 90 tablet 0   atorvastatin  (LIPITOR) 80 MG tablet Take 1 tablet (80 mg total) by mouth daily. 90 tablet 2   Blood Pressure Monitoring (BLOOD PRESSURE CUFF) MISC To check blood pressure daily and prn 1 each 0   busPIRone  (BUSPAR ) 15 MG tablet TAKE 1/2 TABLET TWICE DAILY 90 tablet 0   Calcium  Carbonate-Vit D-Min (CALCIUM  1200 PO) Take 1 tablet by mouth daily.      chlorthalidone  (HYGROTON ) 25 MG tablet Take 0.5 tablets (12.5 mg total) by mouth daily. 45 tablet 2   cyanocobalamin  (VITAMIN B12) 1000 MCG/ML injection Inject 1,000 mcg into the muscle every 30 (thirty) days.     cyclobenzaprine  (FLEXERIL ) 10 MG tablet TAKE 1 TABLET AT BEDTIME AS NEEDED, CAUTION FOR SEDATION/DIZZINESS AND FALLS 90 tablet 0   ezetimibe  (ZETIA ) 10 MG tablet Take 1 tablet (10 mg total) by mouth daily. 90 tablet 2   famotidine  (PEPCID ) 20 MG tablet TAKE 2 TABLETS TWICE DAILY 360 tablet 0   levothyroxine  (SYNTHROID ) 100 MCG tablet Take 1 tablet (100 mcg total) by mouth daily. 90 tablet 0   lidocaine  (XYLOCAINE ) 2 % solution Use as directed 15 mLs in the mouth or throat  as needed for mouth pain. 100 mL 0   LORazepam  (ATIVAN ) 1 MG tablet TAKE 1 TABLET AT BEDTIME AS NEEDED FOR SLEEP 30 tablet 0   losartan  (COZAAR ) 50 MG tablet TAKE 1 TABLET EVERY DAY 90 tablet 0   PROLIA  60 MG/ML SOSY injection Inject 60 mg into the skin every 6 (six) months.     traZODone  (DESYREL ) 50 MG tablet TAKE 1/2 TO 1 TABLET (25 TO 50MG  TOTAL) AT BEDTIME AS NEEDED FOR SLEEP. 90 tablet 2   No current facility-administered medications on file prior to visit.    Review of Systems  Constitutional:  Negative for activity change, appetite change, fatigue, fever and unexpected weight change.  HENT:  Negative for congestion, ear pain, rhinorrhea, sinus pressure and sore throat.   Eyes:  Negative for pain, redness and visual disturbance.  Respiratory:  Negative for cough, shortness of breath and wheezing.   Cardiovascular:  Negative for chest pain and palpitations.  Gastrointestinal:  Negative for abdominal distention, abdominal pain, anal bleeding, blood in stool, constipation, diarrhea, nausea, rectal pain and vomiting.       Interminttent low abd pain  Not today  Endocrine: Negative for polydipsia and polyuria.  Genitourinary:  Negative for dysuria, frequency and urgency.  Musculoskeletal:  Negative for arthralgias, back pain and myalgias.  Skin:  Negative for pallor and rash.  Allergic/Immunologic: Negative for environmental allergies.  Neurological:  Negative for dizziness, syncope and headaches.  Hematological:  Negative for adenopathy. Does not bruise/bleed easily.  Psychiatric/Behavioral:  Positive for sleep disturbance. Negative for decreased concentration and dysphoric mood. The patient is not nervous/anxious.        Objective:   Physical Exam Constitutional:      General: She is not in acute distress.    Appearance: Normal appearance. She is well-developed. She is not ill-appearing or diaphoretic.     Comments: Under weight  baseline  HENT:     Head: Normocephalic  and  atraumatic.     Right Ear: Tympanic membrane, ear canal and external ear normal.     Left Ear: Tympanic membrane, ear canal and external ear normal.     Nose: Nose normal. No congestion.     Mouth/Throat:     Mouth: Mucous membranes are moist.     Pharynx: Oropharynx is clear. No posterior oropharyngeal erythema.  Eyes:     General: No scleral icterus.    Extraocular Movements: Extraocular movements intact.     Conjunctiva/sclera: Conjunctivae normal.     Pupils: Pupils are equal, round, and reactive to light.  Neck:     Thyroid : No thyromegaly.     Vascular: No carotid bruit or JVD.  Cardiovascular:     Rate and Rhythm: Normal rate and regular rhythm.     Pulses: Normal pulses.     Heart sounds: Normal heart sounds.     No gallop.  Pulmonary:     Effort: Pulmonary effort is normal. No respiratory distress.     Breath sounds: Normal breath sounds. No wheezing or rales.     Comments: Good air exch Chest:     Chest wall: No tenderness.  Abdominal:     General: Bowel sounds are normal. There is no distension or abdominal bruit.     Palpations: Abdomen is soft. There is no mass.     Tenderness: There is no abdominal tenderness.     Hernia: No hernia is present.  Genitourinary:    Comments: Declines breast exam or mammogram  Musculoskeletal:        General: No tenderness. Normal range of motion.     Cervical back: Normal range of motion and neck supple. No rigidity. No muscular tenderness.     Right lower leg: No edema.     Left lower leg: No edema.     Comments: No kyphosis   Lymphadenopathy:     Cervical: No cervical adenopathy.  Skin:    General: Skin is warm and dry.     Coloration: Skin is not pale.     Findings: No erythema or rash.     Comments: Tanned Solar lentigines diffusely Some scattered sks Also some senile purpura on arms and lower legs   Neurological:     Mental Status: She is alert. Mental status is at baseline.     Cranial Nerves: No cranial nerve  deficit.     Motor: No abnormal muscle tone.     Coordination: Coordination normal.     Gait: Gait normal.     Deep Tendon Reflexes: Reflexes are normal and symmetric. Reflexes normal.  Psychiatric:        Mood and Affect: Mood normal.        Cognition and Memory: Cognition and memory normal.           Assessment & Plan:   Problem List Items Addressed This Visit       Cardiovascular and Mediastinum   Essential hypertension - Primary   bp in fair control at this time  BP Readings from Last 1 Encounters:  05/29/24 136/68   No changes needed Most recent labs reviewed  Disc lifstyle change with low sodium diet and exercise  Chlorthalidone  15 mg daily  Losartan  50 mg daily  Amlodipine  5 mg daily   Lab today         Relevant Orders   TSH   Lipid panel   Comprehensive metabolic panel with GFR   CBC with Differential/Platelet  Digestive   GERD (gastroesophageal reflux disease)   Controlled with pepcid          Endocrine   Hypothyroidism   TSH today Now taking 100 mcg levothyroxine  daily Feels better at this dose       Relevant Orders   TSH     Musculoskeletal and Integument   Osteoporosis   Due for dexa  Ordered Discussed fall prevention, supplements and exercise for bone density   Last vitamin D  Lab Results  Component Value Date   VD25OH 42.21 03/20/2024         Relevant Orders   DG Bone Density     Other   Prediabetes   A1c drawn   Encouraged more protein calories to gain weight but not excess sugar Reviewed protein sources        Relevant Orders   Hemoglobin A1c   Lower abdominal pain   Intermittent crampy pain for several months  No triggers  No bowel changes   Of note-last colonoscopy 2019 - pt had polyps and inadequate prep They wanted to get repeat- per pt -could not afford the copay for that       Low serum vitamin B12   B12 shot monthly  Lab today  No clinical changes       Relevant Orders   Vitamin B12    HYPERCHOLESTEROLEMIA   Disc goals for lipids and reasons to control them Rev last labs with pt Rev low sat fat diet in detail   Lab today  Continues atorvastatin  80 mg daily and zetia  10 mg daily       Relevant Orders   Lipid panel   Comprehensive metabolic panel with GFR   History of depression   Continues nortriptyline  75 mg for sleep and also this        Other Visit Diagnoses       Need for influenza vaccination       Relevant Orders   Flu vaccine HIGH DOSE PF(Fluzone Trivalent) (Completed)

## 2024-05-29 NOTE — Assessment & Plan Note (Signed)
Controlled with pepcid.

## 2024-05-29 NOTE — Assessment & Plan Note (Signed)
 Continues nortriptyline  75 mg for sleep and also this

## 2024-05-29 NOTE — Assessment & Plan Note (Signed)
 Intermittent crampy pain for several months  No triggers  No bowel changes   Of note-last colonoscopy 2019 - pt had polyps and inadequate prep They wanted to get repeat- per pt -could not afford the copay for that

## 2024-05-29 NOTE — Assessment & Plan Note (Signed)
A1c drawn   Encouraged more protein calories to gain weight but not excess sugar Reviewed protein sources

## 2024-05-29 NOTE — Patient Instructions (Signed)
  Keep walking Add some strength training to your routine, this is important for bone and brain health and can reduce your risk of falls and help your body use insulin properly and regulate weight  Light weights, exercise bands , and internet videos are a good way to start  Yoga (chair or regular), machines , floor exercises or a gym with machines are also good options     Call and schedule your bone density test  You have an order for:  []   3D Mammogram  [x]   Bone Density     Please call for appointment:   [x]   [facility]  [address] [place] [address]  [phone]  []   [facility])   [address]  [place], [place] [address]  [phone]  []   [facility]      [address] [place], [place]        [phone]         []   [facility]  [address] [place], [place]  [phone]  []  [facility]   [address]   [place], [place] [address]  [phone]  []  [facility]  [address] [place], [place] [address] [phone]    Make sure to wear two piece clothing  No lotions powders or deodorants the day of the appointment Make sure to bring picture ID and insurance card.  Bring list of medications you are currently taking including any supplements.   Schedule your screening mammogram through MyChart!   Select [place] imaging sites can now be scheduled through MyChart.  Log into your MyChart account.  Go to 'Visit' (or 'Appointments' if  on mobile App) --> Schedule an  Appointment  Under 'Select a Reason for Visit' choose the Mammogram  Screening option.  Complete the pre-visit questions  and select the time and place that  best fits your schedule

## 2024-05-29 NOTE — Assessment & Plan Note (Signed)
 bp in fair control at this time  BP Readings from Last 1 Encounters:  05/29/24 136/68   No changes needed Most recent labs reviewed  Disc lifstyle change with low sodium diet and exercise  Chlorthalidone  15 mg daily  Losartan  50 mg daily  Amlodipine  5 mg daily   Lab today

## 2024-05-29 NOTE — Assessment & Plan Note (Signed)
 Due for dexa  Ordered Discussed fall prevention, supplements and exercise for bone density   Last vitamin D  Lab Results  Component Value Date   VD25OH 42.21 03/20/2024

## 2024-05-30 ENCOUNTER — Ambulatory Visit: Payer: Self-pay

## 2024-05-30 ENCOUNTER — Other Ambulatory Visit: Payer: Self-pay | Admitting: *Deleted

## 2024-05-30 DIAGNOSIS — N200 Calculus of kidney: Secondary | ICD-10-CM

## 2024-05-30 MED ORDER — LEVOTHYROXINE SODIUM 125 MCG PO TABS: 125.0000 ug | ORAL_TABLET | Freq: Every day | ORAL | 0 refills | Status: AC

## 2024-05-30 NOTE — Telephone Encounter (Signed)
 FYI Only or Action Required?: Action required by provider: lab or test result follow-up needed.  Patient was last seen in primary care on 05/29/2024 by [name] [name] LABOR, MD.  Called Nurse Triage reporting Labs Only.  Triage Disposition: Information or Advice Only Call  Patient/caregiver understands and will follow disposition?: Yes  **See note below**        Copied from CRM #[id number]. Topic: Clinical - Lab/Test Results >> May 30, 2024  1:18 PM [name] [name] wrote: Reason for CRM: Patient called for lab results Agent read labs to patient but patient did not understand everything that was read for results. Reason for Disposition  Health information question, no triage required and triager able to answer question  Answer Assessment - Initial Assessment Questions 1. REASON FOR CALL: What is the main reason for your call? or How can I best help you?  Patient called back regarding labs. Message from Dr. Randeen provided.  Patient is open to diabetic teaching. She also denies missing any doses of levothyroxine . She is also taking the levothyroxine  separate. Patient requesting [name] call back to further discuss DM teaching program.  Protocols used: Information Only Call - No Triage-[name]-AH

## 2024-05-30 NOTE — Telephone Encounter (Signed)
 Pt aware of lab results (see 05/29/24 Labs, Resut Notes).

## 2024-06-05 ENCOUNTER — Ambulatory Visit

## 2024-06-06 ENCOUNTER — Other Ambulatory Visit: Payer: Self-pay | Admitting: Family Medicine

## 2024-06-06 ENCOUNTER — Ambulatory Visit: Payer: Self-pay | Admitting: Urology

## 2024-06-07 ENCOUNTER — Other Ambulatory Visit: Payer: Self-pay | Admitting: Family Medicine

## 2024-06-07 NOTE — Telephone Encounter (Signed)
 LOV 05/29/24 Annual   Last refill 09/14/23 #90 w/ 2 refills  NOV 09/03/24 3 mo f/u

## 2024-06-11 ENCOUNTER — Ambulatory Visit

## 2024-06-12 ENCOUNTER — Ambulatory Visit

## 2024-06-18 ENCOUNTER — Ambulatory Visit

## 2024-06-20 ENCOUNTER — Other Ambulatory Visit: Payer: Self-pay | Admitting: Family Medicine

## 2024-06-20 DIAGNOSIS — G8929 Other chronic pain: Secondary | ICD-10-CM

## 2024-06-20 NOTE — Telephone Encounter (Unsigned)
 Copied from CRM #[id number]. Topic: Clinical - Medication Refill >> Jun 20, 2024  9:02 AM Tanazia G wrote: Medication: cyclobenzaprine  (FLEXERIL ) 10 MG tablet  Has the patient contacted their pharmacy? Yes (Agent: If no, request that the patient contact the pharmacy for the refill. If patient does not wish to contact the pharmacy document the reason why and proceed with request.) (Agent: If yes, when and what did the pharmacy advise?)  This is the patient's preferred pharmacy:   [facility] [address], [place] - [address] [place] [address] Phone: [phone] Fax: [phone]  Is this the correct pharmacy for this prescription? Yes If no, delete pharmacy and type the correct one.   Has the prescription been filled recently? Yes  Is the patient out of the medication? Yes  Has the patient been seen for an appointment in the last year OR does the patient have an upcoming appointment? Yes  Can we respond through MyChart? Yes  Agent: Please be advised that Rx refills may take up to 3 business days. We ask that you follow-up with your pharmacy.

## 2024-06-21 NOTE — Telephone Encounter (Signed)
 Spoke with pt asking about refill request. States she didn't pick it up in Aug so [facility]. Says she has spoken with [facility] and they told her they are getting rx ready for her to pick up today.

## 2024-06-21 NOTE — Telephone Encounter (Signed)
 Is this refill early ?   90 pills, refilled just over 2 mo ago ?

## 2024-06-21 NOTE — Telephone Encounter (Signed)
 Flexeril  Last rx:  04/17/24, #90 Last OV:  05/29/24, f/u Next OV:  09/03/24, 3 mo f/u

## 2024-06-22 ENCOUNTER — Other Ambulatory Visit: Payer: Self-pay | Admitting: Family Medicine

## 2024-06-22 NOTE — Telephone Encounter (Signed)
 Name of Medication: Ativan  Name of Pharmacy: [facility] Last Fill or Written Date and Quantity: 05/18/24 #30 tabs/ 0 refills  Last Office Visit and Type: f/u 05/29/24 Next Office Visit and Type: f/u 09/03/24

## 2024-06-25 ENCOUNTER — Ambulatory Visit

## 2024-07-02 ENCOUNTER — Ambulatory Visit
Admission: RE | Admit: 2024-07-02 | Discharge: 2024-07-02 | Disposition: A | Discharge status: Home / Self Care | Source: Ambulatory Visit | Attending: Urology | Admitting: Urology

## 2024-07-02 ENCOUNTER — Ambulatory Visit: Admitting: Urology

## 2024-07-02 ENCOUNTER — Ambulatory Visit
Admission: RE | Admit: 2024-07-02 | Discharge: 2024-07-02 | Disposition: A | Discharge status: Home / Self Care | Attending: Urology | Admitting: Urology

## 2024-07-02 VITALS — BP 128/73 | HR 94 | Ht 65.0 in | Wt 105.0 lb

## 2024-07-02 DIAGNOSIS — R109 Unspecified abdominal pain: Secondary | ICD-10-CM | POA: Diagnosis not present

## 2024-07-02 DIAGNOSIS — R310 Gross hematuria: Secondary | ICD-10-CM

## 2024-07-02 DIAGNOSIS — N200 Calculus of kidney: Secondary | ICD-10-CM | POA: Insufficient documentation

## 2024-07-02 DIAGNOSIS — R10A1 Flank pain, right side: Secondary | ICD-10-CM

## 2024-07-02 NOTE — Patient Instructions (Signed)
 Scheduling number: [phone]

## 2024-07-02 NOTE — Progress Notes (Unsigned)
 07/02/2024 10:44 AM   [name] [name] Burn 1945-12-03 [id number]  Referring provider: Randeen Laine LABOR, MD [address] [place],  [place] [address]  Chief Complaint  Patient presents with   Nephrolithiasis   Urologic history:  1.  Incomplete bladder emptying Initial visit 03/28/2023 with complaints of frequency, urgency and sensation of incomplete emptying PVR 172 mL  2.  Vaginal bleeding Noted blood on toilet paper when wiping Cystoscopy 05/2023 unremarkable RUS with 8 mm left renal calculus   HPI: [name] [name] is a 78 y.o. female presents for annual follow-up.  Since her last visit she has been complaining of lower abdominal pain and right flank pain occurring intermittently.  She has also had intermittent gross hematuria No dysuria   PMH: Past Medical History:  Diagnosis Date   Aortic calcification    COPD (chronic obstructive pulmonary disease) (HCC)    Depression    Hyperlipidemia    Hypertension    Hypothyroidism    Osteoarthritis    Osteoporosis    Shingles    arm 1/12   Sleep disorder    Tobacco abuse    past; quit 09    Surgical History: Past Surgical History:  Procedure Laterality Date   ABDOMINAL HYSTERECTOMY     BIOPSY THYROID      pos neoplasm, surgery 09/2006   bunions  06/1998   CATARACT EXTRACTION W/PHACO Left 04/20/2022   Procedure: CATARACT EXTRACTION PHACO AND INTRAOCULAR LENS PLACEMENT (IOC) LEFT;  Surgeon: Jaye Fallow, MD;  Location: [facility];  Service: Ophthalmology;  Laterality: Left;  10.24 0058.5   CATARACT EXTRACTION W/PHACO Right 05/04/2022   Procedure: CATARACT EXTRACTION PHACO AND INTRAOCULAR LENS PLACEMENT (IOC) RIGHT;  Surgeon: Jaye Fallow, MD;  Location: [facility];  Service: Ophthalmology;  Laterality: Right;  6.94 00:43.2   COLONOSCOPY WITH PROPOFOL  N/A 03/15/2018   Procedure: COLONOSCOPY WITH PROPOFOL ;  Surgeon: Janalyn Keene NOVAK, MD;  Location: [facility];  Service: Endoscopy;   Laterality: N/A;   CORONARY ANGIOPLASTY     CXR-copd, ts partial compression fracture  12/2006   dexa  12/1996   ESOPHAGOGASTRODUODENOSCOPY (EGD) WITH PROPOFOL  N/A 03/15/2018   Procedure: ESOPHAGOGASTRODUODENOSCOPY (EGD) WITH PROPOFOL ;  Surgeon: Janalyn Keene NOVAK, MD;  Location: [facility];  Service: Endoscopy;  Laterality: N/A;   GANGLION CYST EXCISION Right 05/16/2019   Procedure: REMOVAL GANGLION OF WRIST;  Surgeon: Edie Norleen PARAS, MD;  Location: [facility];  Service: Orthopedics;  Laterality: Right;   hashimoto  02/1997   hyerectomy- cervical ca cells     LEFT HEART CATH AND CORONARY ANGIOGRAPHY Left 12/23/2017   Procedure: LEFT HEART CATH AND CORONARY ANGIOGRAPHY;  Surgeon: Perla Evalene PARAS, MD;  Location: [facility];  Service: Cardiovascular;  Laterality: Left;   left wrist fracture     surgery   right thyroid  lobectomy, goiter, thyroiditis  12/2006   stress fractures foot     THYROIDECTOMY  11/2009    Home Medications:  Allergies as of 07/02/2024       Reactions   Fosamax  [alendronate  Sodium]    Dysphagia and heartburn    Raloxifene  Hcl Other (See Comments)   Leg pain and cramps  Leg pain and cramps         Medication List        Accurate as of July 02, 2024 10:44 AM. If you have any questions, ask your nurse or doctor.          albuterol  108 (90 Base) MCG/ACT inhaler Commonly  known as: VENTOLIN  HFA Inhale 1-2 puffs into the lungs every 4 (four) hours as needed for wheezing or shortness of breath.   amLODipine  5 MG tablet Commonly known as: NORVASC  TAKE 1 TABLET EVERY DAY   atorvastatin  80 MG tablet Commonly known as: LIPITOR TAKE 1 TABLET EVERY DAY   Blood Pressure Cuff Misc To check blood pressure daily and prn   busPIRone  15 MG tablet Commonly known as: BUSPAR  TAKE 1/2 TABLET TWICE DAILY   CALCIUM  1200 PO Take 1 tablet by mouth daily.   chlorthalidone  25 MG tablet Commonly known as: HYGROTON  Take 0.5 tablets (12.5 mg total) by mouth  daily.   cyanocobalamin  1000 MCG/ML injection Commonly known as: VITAMIN B12 Inject 1,000 mcg into the muscle every 30 (thirty) days.   cyclobenzaprine  10 MG tablet Commonly known as: FLEXERIL  TAKE 1 TABLET AT BEDTIME AS NEEDED, CAUTION FOR SEDATION/DIZZINESS AND FALLS   ezetimibe  10 MG tablet Commonly known as: ZETIA  Take 1 tablet (10 mg total) by mouth daily.   famotidine  20 MG tablet Commonly known as: PEPCID  TAKE 2 TABLETS TWICE DAILY   levothyroxine  125 MCG tablet Commonly known as: SYNTHROID  Take 1 tablet (125 mcg total) by mouth daily.   lidocaine  2 % solution Commonly known as: XYLOCAINE  Use as directed 15 mLs in the mouth or throat as needed for mouth pain.   LORazepam  1 MG tablet Commonly known as: ATIVAN  TAKE 1 TABLET AT BEDTIME AS NEEDED FOR SLEEP   losartan  50 MG tablet Commonly known as: COZAAR  TAKE 1 TABLET EVERY DAY   nortriptyline  75 MG capsule Commonly known as: PAMELOR  Take 1 capsule (75 mg total) by mouth at bedtime.   Prolia  60 MG/ML Sosy injection Generic drug: denosumab  Inject 60 mg into the skin every 6 (six) months.   traZODone  50 MG tablet Commonly known as: DESYREL  TAKE 1/2 TO 1 TABLET (25 TO 50MG  TOTAL) AT BEDTIME AS NEEDED FOR SLEEP.        Allergies:  Allergies  Allergen Reactions   Fosamax  [Alendronate  Sodium]     Dysphagia and heartburn    Raloxifene  Hcl Other (See Comments)    Leg pain and cramps  Leg pain and cramps     Family History: Family History  Problem Relation Age of Onset   Stroke Father    Hypertension Father    Diabetes Mother    Coronary artery disease Mother     Social History:  reports that she quit smoking about 4 years ago. Her smoking use included cigarettes. She has never used smokeless tobacco. She reports that she does not drink alcohol and does not use drugs.   Physical Exam: BP 128/73   Pulse 94   Ht 5' 5 (1.651 m)   Wt 105 lb (47.6 kg)   BMI 17.47 kg/m   Constitutional:  Alert, No  acute distress. HEENT: [place] AT Respiratory: Normal respiratory effort, no increased work of breathing. Psychiatric: Normal mood and affect.   Pertinent Imaging: KUB performed prior to her visit was personally reviewed and interpreted.  There are calcifications of the chondral cartilages overlying the renal outlines.  No definite calcifications suspicious for urinary tract stones are not identified  Assessment & Plan:   Intermittent right flank and lower abdominal pain Intermittent gross hematuria Recommend further evaluation with CT urogram and cystoscopy.  The procedures were discussed and she desires to proceed with further evaluation   Glendia JAYSON Barba, MD  [facility] [place] [address], [address] [place],  [place] [address] [phone]

## 2024-07-03 ENCOUNTER — Other Ambulatory Visit: Payer: Self-pay | Admitting: Family Medicine

## 2024-07-03 ENCOUNTER — Encounter: Payer: Self-pay | Admitting: Urology

## 2024-07-03 DIAGNOSIS — H00025 Hordeolum internum left lower eyelid: Secondary | ICD-10-CM | POA: Diagnosis not present

## 2024-07-17 ENCOUNTER — Ambulatory Visit (INDEPENDENT_AMBULATORY_CARE_PROVIDER_SITE_OTHER)

## 2024-07-17 DIAGNOSIS — E538 Deficiency of other specified B group vitamins: Secondary | ICD-10-CM

## 2024-07-17 MED ORDER — CYANOCOBALAMIN 1000 MCG/ML IJ SOLN
1000.0000 ug | Freq: Once | INTRAMUSCULAR | Status: AC
  Administered 2024-07-17: 1000 ug via INTRAMUSCULAR

## 2024-07-17 NOTE — Progress Notes (Signed)
 Per orders of Dr. Roxy Manns, injection of vitamin b 12 given by Lewanda Rife in right deltoid. Patient tolerated injection well. Patient will make appointment for 1 month.

## 2024-07-21 ENCOUNTER — Other Ambulatory Visit: Payer: Self-pay | Admitting: Family Medicine

## 2024-07-23 NOTE — Telephone Encounter (Signed)
 Name of Medication: Ativan  Name of Pharmacy: [facility] Last Fill or Written Date and Quantity: 06/22/24/25 #30 tabs/ 0 refills  Last Office Visit and Type: f/u 05/29/24 Next Office Visit and Type: f/u 09/03/24

## 2024-07-24 ENCOUNTER — Other Ambulatory Visit

## 2024-07-24 ENCOUNTER — Ambulatory Visit
Admission: RE | Admit: 2024-07-24 | Discharge: 2024-07-24 | Disposition: A | Discharge status: Home / Self Care | Source: Ambulatory Visit | Attending: Urology | Admitting: Urology

## 2024-07-24 DIAGNOSIS — K449 Diaphragmatic hernia without obstruction or gangrene: Secondary | ICD-10-CM | POA: Diagnosis not present

## 2024-07-24 DIAGNOSIS — R310 Gross hematuria: Secondary | ICD-10-CM | POA: Insufficient documentation

## 2024-07-24 DIAGNOSIS — N289 Disorder of kidney and ureter, unspecified: Secondary | ICD-10-CM | POA: Diagnosis not present

## 2024-07-24 DIAGNOSIS — N200 Calculus of kidney: Secondary | ICD-10-CM | POA: Diagnosis not present

## 2024-07-24 MED ORDER — IOHEXOL 300 MG/ML  SOLN
100.0000 mL | Freq: Once | INTRAMUSCULAR | Status: AC | PRN
  Administered 2024-07-24: 100 mL via INTRAVENOUS

## 2024-07-27 ENCOUNTER — Ambulatory Visit: Payer: Self-pay | Admitting: Urology

## 2024-07-31 DIAGNOSIS — H00025 Hordeolum internum left lower eyelid: Secondary | ICD-10-CM | POA: Diagnosis not present

## 2024-07-31 DIAGNOSIS — D23122 Other benign neoplasm of skin of left lower eyelid, including canthus: Secondary | ICD-10-CM | POA: Diagnosis not present

## 2024-08-14 ENCOUNTER — Other Ambulatory Visit: Admitting: Urology

## 2024-08-15 ENCOUNTER — Other Ambulatory Visit: Payer: Self-pay | Admitting: Family Medicine

## 2024-08-16 NOTE — Telephone Encounter (Signed)
 Last filled on 04/17/24 #90 tab/ 0 refills   F/u scheduled on 09/03/24

## 2024-08-21 ENCOUNTER — Telehealth: Payer: Self-pay | Admitting: *Deleted

## 2024-08-21 ENCOUNTER — Ambulatory Visit

## 2024-08-21 DIAGNOSIS — E538 Deficiency of other specified B group vitamins: Secondary | ICD-10-CM

## 2024-08-21 MED ORDER — CYANOCOBALAMIN 1000 MCG/ML IJ SOLN
1000.0000 ug | Freq: Once | INTRAMUSCULAR | Status: AC
  Administered 2024-08-21: 10:00:00 1000 ug via INTRAMUSCULAR

## 2024-08-21 NOTE — Progress Notes (Signed)
 Per orders of Dr. Laine Balls, injection of vitamin b 12 inj given by Laray Arenas in left deltoid. Patient tolerated injection well. Patient will make appointment for 1 month.

## 2024-08-22 ENCOUNTER — Other Ambulatory Visit (HOSPITAL_COMMUNITY): Payer: Self-pay

## 2024-08-22 ENCOUNTER — Other Ambulatory Visit: Payer: Self-pay | Admitting: Family Medicine

## 2024-08-22 NOTE — Telephone Encounter (Unsigned)
 Copied from CRM [id number]. Topic: Clinical - Medication Refill >> Aug 22, 2024  1:23 PM Burnard DEL wrote: Medication: LORazepam  (ATIVAN ) 1 MG tablet  Has the patient contacted their pharmacy? Yes (Agent: If no, request that the patient contact the pharmacy for the refill. If patient does not wish to contact the pharmacy document the reason why and proceed with request.) (Agent: If yes, when and what did the pharmacy advise?)  This is the patient's preferred pharmacy:  [facility] - [place], [place] - [address] [place] [address] Phone: [phone] Fax: [phone]    Is this the correct pharmacy for this prescription? Yes If no, delete pharmacy and type the correct one.   Has the prescription been filled recently? NO  Is the patient out of the medication? No(5 days left)  Has the patient been seen for an appointment in the last year OR does the patient have an upcoming appointment? Yes  Can we respond through MyChart? No  Agent: Please be advised that Rx refills may take up to 3 business days. We ask that you follow-up with your pharmacy.

## 2024-08-23 ENCOUNTER — Other Ambulatory Visit (HOSPITAL_COMMUNITY): Payer: Self-pay

## 2024-08-23 ENCOUNTER — Telehealth: Payer: Self-pay

## 2024-08-23 MED ORDER — LORAZEPAM 1 MG PO TABS: 1.0000 mg | ORAL_TABLET | Freq: Every evening | ORAL | 0 refills | Status: DC | PRN

## 2024-08-23 NOTE — Telephone Encounter (Signed)
 Pharmacy Patient Advocate Encounter   Received notification from Pt Calls Messages that prior authorization for Cyclobenzaprine  HCL 10 tabs is required/requested.   Insurance verification completed.   The patient is insured through [employer].   Per test claim: PA required; PA submitted to above mentioned insurance via Latent Key/confirmation #/EOC [facility] Status is pending

## 2024-08-23 NOTE — Telephone Encounter (Signed)
 Name of Medication: Ativan  Name of Pharmacy: [facility] Last Fill or Written Date and Quantity: 07/23/24 #30 tabs/ 0 refills  Last Office Visit and Type: f/u 05/29/24 Next Office Visit and Type: f/u 09/03/24

## 2024-08-27 ENCOUNTER — Other Ambulatory Visit (HOSPITAL_COMMUNITY): Payer: Self-pay

## 2024-08-27 NOTE — Telephone Encounter (Signed)
 Pharmacy Patient Advocate Encounter  Received notification from [employer] that Prior Authorization for Cyclobenzaprine  HCL 10 tabs has been APPROVED from 09/07/23 to 09/05/25. Unable to obtain price due to refill too soon rejection, last fill date 08/24/24 next available fill date3/4/26   PA #/Case ID/Reference #: # [id number]

## 2024-08-30 ENCOUNTER — Other Ambulatory Visit: Payer: Self-pay | Admitting: Family Medicine

## 2024-09-03 ENCOUNTER — Other Ambulatory Visit: Payer: Self-pay | Admitting: Family Medicine

## 2024-09-03 ENCOUNTER — Ambulatory Visit: Admitting: Family Medicine

## 2024-09-03 NOTE — Progress Notes (Deleted)
 "  Subjective:    Patient ID: [name] [name], female    DOB: Mar 12, 1946, 78 y.o.   MRN: [id number]  HPI  Wt Readings from Last 3 Encounters:  07/02/24 105 lb (47.6 kg)  05/29/24 105 lb (47.6 kg)  03/20/24 102 lb 6 oz (46.4 kg)      There were no vitals filed for this visit.  Pt presents for follow up of chronic health problems  HTN Prediabetes Hypothyroid    HTN bp is stable today  No cp or palpitations or headaches or edema  No side effects to medicines  BP Readings from Last 3 Encounters:  07/02/24 128/73  05/29/24 136/68  03/20/24 110/60    Chlorthalidone  15 mg daily  Losartan  50 mg daily  Amlodipine  5 mg daily  Lab Results  Component Value Date   NA 142 05/29/2024   K 3.6 05/29/2024   CO2 30 05/29/2024   GLUCOSE 99 05/29/2024   BUN 14 05/29/2024   CREATININE 0.90 05/29/2024   CALCIUM  9.3 05/29/2024   GFR 61.15 05/29/2024   GFRNONAA >60 05/18/2022   Hypothyroidism  Pt has no clinical changes No change in energy level/ hair or skin/ edema and no tremor Lab Results  Component Value Date   TSH 16.53 (H) 05/29/2024    Went up on levothyroxine  to 125 mcg daily  Prediabetes Lab Results  Component Value Date   HGBA1C 6.6 (H) 05/29/2024   HGBA1C 6.0 05/10/2023   HGBA1C 6.4 04/12/2022   Last visit A1c was elevated more than past   Was ref to dm teaching    Patient Active Problem List   Diagnosis Date Noted   Lower abdominal pain 05/29/2024   Leg cramping 03/20/2024   Skin lesion 03/05/2024   Urinary retention 02/24/2023   GERD (gastroesophageal reflux disease) 02/02/2022   Microscopic hematuria 02/02/2022   Low serum vitamin B12 08/07/2020   New onset type 2 diabetes mellitus (HCC) 03/07/2020   Tingling in extremities 03/07/2020   Neck pain 11/05/2019   Ganglion cyst of tendon sheath of right hand 05/17/2019   Melanosis, colon    Other specified disease of esophagus    Constipation 01/24/2018   Anxiety 01/09/2018   Coronary artery  calcification seen on CAT scan 11/25/2017   Special screening for malignant neoplasms, colon 11/01/2017   Estrogen deficiency 09/26/2017   Aortic calcification 11/30/2016   History of patellar fracture 11/30/2016   Routine general medical examination at a health care facility 06/27/2016   Loss of weight 06/04/2016   Cough 04/26/2016   History of vertebral compression fracture 01/19/2016   Chronic foot pain 10/27/2011   Low back pain 04/14/2011   Herpes simplex virus (HSV) infection 11/17/2010   Essential hypertension 08/18/2009   BUNION 03/04/2009   Allergic rhinitis 01/10/2008   H/O goiter 02/09/2007   Hypothyroidism 02/09/2007   HYPERCHOLESTEROLEMIA 02/09/2007   History of depression 02/09/2007   OSTEOARTHRITIS 02/09/2007   Osteoporosis 02/09/2007   Disturbance in sleep behavior 02/09/2007   Past Medical History:  Diagnosis Date   Aortic calcification    COPD (chronic obstructive pulmonary disease) (HCC)    Depression    Hyperlipidemia    Hypertension    Hypothyroidism    Osteoarthritis    Osteoporosis    Shingles    arm 1/12   Sleep disorder    Tobacco abuse    past; quit 09   Past Surgical History:  Procedure Laterality Date   ABDOMINAL HYSTERECTOMY     BIOPSY THYROID   pos neoplasm, surgery 09/2006   bunions  06/1998   CATARACT EXTRACTION W/PHACO Left 04/20/2022   Procedure: CATARACT EXTRACTION PHACO AND INTRAOCULAR LENS PLACEMENT (IOC) LEFT;  Surgeon: Jaye Fallow, MD;  Location: [facility];  Service: Ophthalmology;  Laterality: Left;  10.24 0058.5   CATARACT EXTRACTION W/PHACO Right 05/04/2022   Procedure: CATARACT EXTRACTION PHACO AND INTRAOCULAR LENS PLACEMENT (IOC) RIGHT;  Surgeon: Jaye Fallow, MD;  Location: [facility];  Service: Ophthalmology;  Laterality: Right;  6.94 00:43.2   COLONOSCOPY WITH PROPOFOL  N/A 03/15/2018   Procedure: COLONOSCOPY WITH PROPOFOL ;  Surgeon: Janalyn Keene NOVAK, MD;  Location: [facility];   Service: Endoscopy;  Laterality: N/A;   CORONARY ANGIOPLASTY     CXR-copd, ts partial compression fracture  12/2006   dexa  12/1996   ESOPHAGOGASTRODUODENOSCOPY (EGD) WITH PROPOFOL  N/A 03/15/2018   Procedure: ESOPHAGOGASTRODUODENOSCOPY (EGD) WITH PROPOFOL ;  Surgeon: Janalyn Keene NOVAK, MD;  Location: [facility];  Service: Endoscopy;  Laterality: N/A;   GANGLION CYST EXCISION Right 05/16/2019   Procedure: REMOVAL GANGLION OF WRIST;  Surgeon: Edie Norleen PARAS, MD;  Location: [facility];  Service: Orthopedics;  Laterality: Right;   hashimoto  02/1997   hyerectomy- cervical ca cells     LEFT HEART CATH AND CORONARY ANGIOGRAPHY Left 12/23/2017   Procedure: LEFT HEART CATH AND CORONARY ANGIOGRAPHY;  Surgeon: Perla Evalene PARAS, MD;  Location: [facility];  Service: Cardiovascular;  Laterality: Left;   left wrist fracture     surgery   right thyroid  lobectomy, goiter, thyroiditis  12/2006   stress fractures foot     THYROIDECTOMY  11/2009   Social History[1] Family History  Problem Relation Age of Onset   Stroke Father    Hypertension Father    Diabetes Mother    Coronary artery disease Mother    Allergies[2] Medications Ordered Prior to Encounter[3]  Review of Systems     Objective:   Physical Exam        Assessment & Plan:   Problem List Items Addressed This Visit   None     [1]  Social History Tobacco Use   Smoking status: Former    Current packs/day: 0.00    Average packs/day: 0.5 packs/day    Types: Cigarettes    Quit date: 08/07/2019    Years since quitting: 5.0   Smokeless tobacco: Never  Vaping Use   Vaping status: Never Used  Substance Use Topics   Alcohol use: No    Alcohol/week: 0.0 standard drinks of alcohol   Drug use: No  [2]  Allergies Allergen Reactions   Fosamax  [Alendronate  Sodium]     Dysphagia and heartburn    Raloxifene  Hcl Other (See Comments)    Leg pain and cramps  Leg pain and cramps   [3]  Current Outpatient Medications on File  Prior to Visit  Medication Sig Dispense Refill   albuterol  (VENTOLIN  HFA) 108 (90 Base) MCG/ACT inhaler Inhale 1-2 puffs into the lungs every 4 (four) hours as needed for wheezing or shortness of breath. 1 each 3   amLODipine  (NORVASC ) 5 MG tablet TAKE 1 TABLET EVERY DAY 90 tablet 0   atorvastatin  (LIPITOR) 80 MG tablet TAKE 1 TABLET EVERY DAY 90 tablet 2   Blood Pressure Monitoring (BLOOD PRESSURE CUFF) MISC To check blood pressure daily and prn 1 each 0   busPIRone  (BUSPAR ) 15 MG tablet TAKE 1/2 TABLET TWICE DAILY 90 tablet 1   Calcium  Carbonate-Vit D-Min (CALCIUM  1200 PO) Take 1 tablet by mouth  daily.      chlorthalidone  (HYGROTON ) 25 MG tablet Take 0.5 tablets (12.5 mg total) by mouth daily. 45 tablet 2   cyanocobalamin  (VITAMIN B12) 1000 MCG/ML injection Inject 1,000 mcg into the muscle every 30 (thirty) days.     cyclobenzaprine  (FLEXERIL ) 10 MG tablet TAKE 1 TABLET AT BEDTIME AS NEEDED (CAUTION FOR SEDATION, DIZZINESS AND FALLS) 90 tablet 0   ezetimibe  (ZETIA ) 10 MG tablet Take 1 tablet (10 mg total) by mouth daily. 90 tablet 2   famotidine  (PEPCID ) 20 MG tablet TAKE 2 TABLETS TWICE DAILY 360 tablet 1   levothyroxine  (SYNTHROID ) 125 MCG tablet Take 1 tablet (125 mcg total) by mouth daily. 90 tablet 0   lidocaine  (XYLOCAINE ) 2 % solution Use as directed 15 mLs in the mouth or throat as needed for mouth pain. 100 mL 0   LORazepam  (ATIVAN ) 1 MG tablet Take 1 tablet (1 mg total) by mouth at bedtime as needed. for sleep 30 tablet 0   losartan  (COZAAR ) 50 MG tablet TAKE 1 TABLET EVERY DAY 90 tablet 0   nortriptyline  (PAMELOR ) 75 MG capsule Take 1 capsule (75 mg total) by mouth at bedtime. 90 capsule 2   PROLIA  60 MG/ML SOSY injection Inject 60 mg into the skin every 6 (six) months.     traZODone  (DESYREL ) 50 MG tablet TAKE 1/2 TO 1 TABLET (25 TO 50MG  TOTAL) AT BEDTIME AS NEEDED FOR SLEEP. 90 tablet 2   No current facility-administered medications on file prior to visit.   "

## 2024-09-04 ENCOUNTER — Other Ambulatory Visit: Admitting: Urology

## 2024-09-04 DIAGNOSIS — R310 Gross hematuria: Secondary | ICD-10-CM

## 2024-09-17 ENCOUNTER — Ambulatory Visit: Payer: Self-pay

## 2024-09-17 NOTE — Telephone Encounter (Signed)
 Please make sure she went to the ER- I do not see any epic notes indicating she is in ER

## 2024-09-17 NOTE — Telephone Encounter (Signed)
 Left VM requesting pt to call the office back

## 2024-09-17 NOTE — Telephone Encounter (Signed)
 FYI Only or Action Required?: FYI only for provider: ED advised.  Patient was last seen in primary care on 05/29/2024 by Randeen Laine LABOR, MD.  Called Nurse Triage reporting Neurologic Problem.  Symptoms began about a month ago.  Triage Disposition: Call EMS 911 Now  Patient/caregiver understands and will follow disposition?: Yes         Copied from CRM #[id number]. Topic: Clinical - Red Word Triage >> Sep 17, 2024  9:20 AM [name] [name] wrote: Red Word that prompted transfer to Nurse Triage: patient states her eyes become blurry and she can't see anything. Right hand In pain and also states feel like a loss of con. Reason for Disposition  Sounds like a life-threatening emergency to the triager  Answer Assessment - Initial Assessment Questions This RN recommends pt goes to ED via ambulance but pt states she is going to get someone to drive her there.    No symptoms currently  Symptoms intermittent last month: Blurry vision  Starts feeling shaky Feels weakness, numbness, tingling on right side of body  Denies: Loss of speech or garbled speech  Protocols used: Neurologic Deficit-A-AH

## 2024-09-18 NOTE — Telephone Encounter (Signed)
 Left VM requesting pt to call the office back

## 2024-09-20 ENCOUNTER — Other Ambulatory Visit: Payer: Self-pay | Admitting: Family Medicine

## 2024-09-20 NOTE — Telephone Encounter (Signed)
 Name of Medication: Ativan  Name of Pharmacy: [facility] Last Fill or Written Date and Quantity: 08/23/24 #30 tabs/ 0 refills  Last Office Visit and Type: f/u 05/29/24 Next Office Visit and Type: none scheduled (pt no-showed f/u on 09/03/24)

## 2024-09-20 NOTE — Telephone Encounter (Signed)
 Please reschedule appointment.

## 2024-09-21 NOTE — Telephone Encounter (Signed)
 Left VM requesting pt to call the office back

## 2024-09-25 ENCOUNTER — Ambulatory Visit

## 2024-09-25 DIAGNOSIS — E538 Deficiency of other specified B group vitamins: Secondary | ICD-10-CM | POA: Diagnosis not present

## 2024-09-25 MED ORDER — CYANOCOBALAMIN 1000 MCG/ML IJ SOLN
1000.0000 ug | Freq: Once | INTRAMUSCULAR | Status: AC
  Administered 2024-09-25: 1000 ug via INTRAMUSCULAR

## 2024-09-25 NOTE — Progress Notes (Signed)
 Per orders of Dr. Laine Balls, injection of B-12 given by Witney Huie Y Lenford Beddow in Right deltoid Patient tolerated injection well. Patient will make appointment for 1 month(s)

## 2024-09-26 NOTE — Telephone Encounter (Signed)
 Pt didn't go to ER and never returned my call however she came in for B12 inj yesterday and was fine

## 2024-10-29 ENCOUNTER — Other Ambulatory Visit

## 2025-02-26 ENCOUNTER — Ambulatory Visit
